# Patient Record
Sex: Female | Born: 1945 | Race: White | Hispanic: No | Marital: Married | Smoking: Never smoker
Health system: Southern US, Community
[De-identification: ages and names within clinical notes are randomized; demographics above are authoritative.]

## PROBLEM LIST (undated history)

## (undated) DIAGNOSIS — M1990 Unspecified osteoarthritis, unspecified site: Secondary | ICD-10-CM

## (undated) DIAGNOSIS — R0600 Dyspnea, unspecified: Secondary | ICD-10-CM

## (undated) DIAGNOSIS — R011 Cardiac murmur, unspecified: Secondary | ICD-10-CM

## (undated) DIAGNOSIS — E782 Mixed hyperlipidemia: Secondary | ICD-10-CM

## (undated) DIAGNOSIS — Z9889 Other specified postprocedural states: Secondary | ICD-10-CM

## (undated) DIAGNOSIS — R350 Frequency of micturition: Secondary | ICD-10-CM

## (undated) DIAGNOSIS — K219 Gastro-esophageal reflux disease without esophagitis: Secondary | ICD-10-CM

## (undated) DIAGNOSIS — D649 Anemia, unspecified: Secondary | ICD-10-CM

## (undated) DIAGNOSIS — Z86010 Personal history of colonic polyps: Secondary | ICD-10-CM

## (undated) DIAGNOSIS — Z860101 Personal history of adenomatous and serrated colon polyps: Secondary | ICD-10-CM

## (undated) DIAGNOSIS — E119 Type 2 diabetes mellitus without complications: Secondary | ICD-10-CM

## (undated) DIAGNOSIS — M6281 Muscle weakness (generalized): Secondary | ICD-10-CM

## (undated) DIAGNOSIS — R0609 Other forms of dyspnea: Secondary | ICD-10-CM

## (undated) DIAGNOSIS — R630 Anorexia: Secondary | ICD-10-CM

## (undated) DIAGNOSIS — R079 Chest pain, unspecified: Secondary | ICD-10-CM

## (undated) DIAGNOSIS — R197 Diarrhea, unspecified: Secondary | ICD-10-CM

## (undated) DIAGNOSIS — Z9989 Dependence on other enabling machines and devices: Secondary | ICD-10-CM

## (undated) DIAGNOSIS — R3915 Urgency of urination: Secondary | ICD-10-CM

## (undated) DIAGNOSIS — C55 Malignant neoplasm of uterus, part unspecified: Secondary | ICD-10-CM

## (undated) DIAGNOSIS — R110 Nausea: Secondary | ICD-10-CM

## (undated) DIAGNOSIS — C541 Malignant neoplasm of endometrium: Secondary | ICD-10-CM

## (undated) DIAGNOSIS — I10 Essential (primary) hypertension: Secondary | ICD-10-CM

## (undated) DIAGNOSIS — M109 Gout, unspecified: Secondary | ICD-10-CM

## (undated) HISTORY — PX: CATARACT EXTRACTION W/ INTRAOCULAR LENS  IMPLANT, BILATERAL: SHX1307

## (undated) HISTORY — DX: Unspecified osteoarthritis, unspecified site: M19.90

## (undated) HISTORY — DX: Gout, unspecified: M10.9

## (undated) HISTORY — PX: TONSILLECTOMY: SUR1361

## (undated) HISTORY — DX: Frequency of micturition: R35.0

## (undated) HISTORY — DX: Anemia, unspecified: D64.9

## (undated) HISTORY — DX: Malignant neoplasm of uterus, part unspecified: C55

---

## 1972-10-22 HISTORY — PX: BREAST CYST EXCISION: SHX579

## 2004-05-09 ENCOUNTER — Other Ambulatory Visit: Admission: RE | Admit: 2004-05-09 | Discharge: 2004-05-09 | Payer: Self-pay | Admitting: Obstetrics and Gynecology

## 2004-11-02 ENCOUNTER — Ambulatory Visit: Payer: Self-pay | Admitting: Cardiology

## 2004-11-21 ENCOUNTER — Encounter: Admission: RE | Admit: 2004-11-21 | Discharge: 2005-02-19 | Payer: Self-pay | Admitting: Cardiology

## 2005-03-28 ENCOUNTER — Ambulatory Visit: Payer: Self-pay | Admitting: Internal Medicine

## 2005-03-29 ENCOUNTER — Encounter (INDEPENDENT_AMBULATORY_CARE_PROVIDER_SITE_OTHER): Payer: Self-pay | Admitting: *Deleted

## 2005-03-29 ENCOUNTER — Ambulatory Visit: Payer: Self-pay | Admitting: Internal Medicine

## 2005-03-30 ENCOUNTER — Ambulatory Visit: Payer: Self-pay | Admitting: Cardiology

## 2005-05-07 ENCOUNTER — Ambulatory Visit: Payer: Self-pay | Admitting: Internal Medicine

## 2005-05-10 ENCOUNTER — Other Ambulatory Visit: Admission: RE | Admit: 2005-05-10 | Discharge: 2005-05-10 | Payer: Self-pay | Admitting: Obstetrics and Gynecology

## 2006-05-09 ENCOUNTER — Ambulatory Visit: Payer: Self-pay | Admitting: Cardiology

## 2006-05-15 ENCOUNTER — Ambulatory Visit: Payer: Self-pay | Admitting: Cardiology

## 2006-10-25 ENCOUNTER — Ambulatory Visit: Payer: Self-pay | Admitting: Internal Medicine

## 2007-04-14 ENCOUNTER — Ambulatory Visit: Payer: Self-pay | Admitting: Internal Medicine

## 2008-04-15 DIAGNOSIS — K222 Esophageal obstruction: Secondary | ICD-10-CM | POA: Insufficient documentation

## 2008-04-15 DIAGNOSIS — E119 Type 2 diabetes mellitus without complications: Secondary | ICD-10-CM | POA: Insufficient documentation

## 2008-04-15 DIAGNOSIS — K219 Gastro-esophageal reflux disease without esophagitis: Secondary | ICD-10-CM | POA: Insufficient documentation

## 2008-04-15 DIAGNOSIS — I10 Essential (primary) hypertension: Secondary | ICD-10-CM | POA: Insufficient documentation

## 2008-04-15 DIAGNOSIS — I517 Cardiomegaly: Secondary | ICD-10-CM | POA: Insufficient documentation

## 2008-04-19 ENCOUNTER — Ambulatory Visit: Payer: Self-pay | Admitting: Internal Medicine

## 2008-07-05 ENCOUNTER — Ambulatory Visit: Payer: Self-pay | Admitting: Internal Medicine

## 2008-12-21 ENCOUNTER — Encounter: Payer: Self-pay | Admitting: Internal Medicine

## 2009-03-29 ENCOUNTER — Ambulatory Visit: Payer: Self-pay | Admitting: Internal Medicine

## 2009-03-30 ENCOUNTER — Telehealth: Payer: Self-pay | Admitting: Internal Medicine

## 2009-04-01 ENCOUNTER — Ambulatory Visit: Payer: Self-pay | Admitting: Internal Medicine

## 2009-04-01 ENCOUNTER — Encounter: Payer: Self-pay | Admitting: Internal Medicine

## 2009-04-05 ENCOUNTER — Encounter: Payer: Self-pay | Admitting: Internal Medicine

## 2009-04-22 ENCOUNTER — Ambulatory Visit: Payer: Self-pay | Admitting: Internal Medicine

## 2009-04-22 DIAGNOSIS — E782 Mixed hyperlipidemia: Secondary | ICD-10-CM | POA: Insufficient documentation

## 2009-04-29 ENCOUNTER — Telehealth: Payer: Self-pay | Admitting: Internal Medicine

## 2009-05-02 ENCOUNTER — Ambulatory Visit: Payer: Self-pay | Admitting: Internal Medicine

## 2009-05-02 DIAGNOSIS — I2510 Atherosclerotic heart disease of native coronary artery without angina pectoris: Secondary | ICD-10-CM | POA: Insufficient documentation

## 2009-05-02 DIAGNOSIS — I700 Atherosclerosis of aorta: Secondary | ICD-10-CM | POA: Insufficient documentation

## 2009-05-03 LAB — CONVERTED CEMR LAB
ALT: 40 units/L — ABNORMAL HIGH (ref 0–35)
Bilirubin, Direct: 0 mg/dL (ref 0.0–0.3)
Calcium: 9.3 mg/dL (ref 8.4–10.5)
GFR calc non Af Amer: 90.56 mL/min (ref >60)
Glucose, Bld: 123 mg/dL — ABNORMAL HIGH (ref 70–99)
HDL: 34.1 mg/dL — ABNORMAL LOW (ref >39.00)
Sodium: 140 meq/L (ref 135–145)
Total Protein: 7.3 g/dL (ref 6.0–8.3)
Triglycerides: 243 mg/dL — ABNORMAL HIGH (ref 0.0–149.0)

## 2009-08-22 ENCOUNTER — Telehealth: Payer: Self-pay | Admitting: Internal Medicine

## 2009-08-23 ENCOUNTER — Telehealth (INDEPENDENT_AMBULATORY_CARE_PROVIDER_SITE_OTHER): Payer: Self-pay | Admitting: *Deleted

## 2010-01-25 ENCOUNTER — Encounter: Payer: Self-pay | Admitting: Internal Medicine

## 2010-01-31 ENCOUNTER — Encounter (INDEPENDENT_AMBULATORY_CARE_PROVIDER_SITE_OTHER): Payer: Self-pay | Admitting: *Deleted

## 2010-03-10 ENCOUNTER — Ambulatory Visit: Payer: Self-pay | Admitting: Internal Medicine

## 2010-03-31 ENCOUNTER — Ambulatory Visit: Payer: Self-pay | Admitting: Internal Medicine

## 2010-03-31 DIAGNOSIS — R0989 Other specified symptoms and signs involving the circulatory and respiratory systems: Secondary | ICD-10-CM | POA: Insufficient documentation

## 2010-04-11 ENCOUNTER — Encounter: Payer: Self-pay | Admitting: Internal Medicine

## 2010-04-11 ENCOUNTER — Ambulatory Visit: Payer: Self-pay

## 2010-05-02 ENCOUNTER — Telehealth: Payer: Self-pay | Admitting: Internal Medicine

## 2010-06-29 ENCOUNTER — Ambulatory Visit: Payer: Self-pay | Admitting: Internal Medicine

## 2010-07-06 ENCOUNTER — Telehealth: Payer: Self-pay | Admitting: Internal Medicine

## 2010-07-07 ENCOUNTER — Telehealth: Payer: Self-pay | Admitting: Internal Medicine

## 2010-07-07 LAB — CONVERTED CEMR LAB
Cholesterol: 119 mg/dL (ref 0–200)
Total CHOL/HDL Ratio: 4

## 2010-09-12 ENCOUNTER — Telehealth: Payer: Self-pay | Admitting: Internal Medicine

## 2010-09-27 ENCOUNTER — Telehealth: Payer: Self-pay | Admitting: Internal Medicine

## 2010-10-26 ENCOUNTER — Telehealth: Payer: Self-pay | Admitting: Internal Medicine

## 2010-11-23 NOTE — Assessment & Plan Note (Signed)
Summary: yearly/sl   Referring Provider:  n/a Primary Provider:  Silvestre Moment, M.D.   History of Present Illness: History of Present Illness: Ms. Madison Holden is a 65 year old woman, who I have seen in the past. She has a history of hypertension, diabetes, dyslipidemia. I last saw her in July  of last year.  Lipids after that  LDL was 109, HDL 34. Since seen she has done ok from a cardiac standpoint.  She is not as active as she wants to be and plans to get more active. She denies chest pains.  Breathing is OK She has had some L wrist pains.  W/U so far suggests a possible rheum problem with incrased ESR.  Patient has an appt with rheum.   Current Medications (verified): 1)  Verapamil Hcl Cr 180 Mg  Cp24 (Verapamil Hcl) .Marland Kitchen.. 1 Tablet Once Daily 2)  Lisinopril 20 Mg  Tabs (Lisinopril) .Marland Kitchen.. 1 Tablet By Mouth Once Daily 3)  Hydrochlorothiazide 25 Mg  Tabs (Hydrochlorothiazide) .... 1/2 Tablet By Mouth Once Daily 4)  Vesicare 5 Mg  Tabs (Solifenacin Succinate) .Marland Kitchen.. 1 Tablet Once Daily 5)  Nexium 40 Mg  Cpdr (Esomeprazole Magnesium) .... Take 1 Tablet By Mouth Once A Day 6)  Metformin Hcl 1000 Mg  Tabs (Metformin Hcl) .Marland Kitchen.. 1 Tablet Two Times A Day 7)  Melatonin Cr 3 Mg  Tbcr (Melatonin) .Marland Kitchen.. 1-2 Times Per Week 8)  Motrin Ib 200 Mg  Tabs (Ibuprofen) .Marland Kitchen.. 1-2  Tablets As Needed 9)  Vanquish 227-194-33 Mg  Tabs (Asa-Apap-Caff Buffered) .... As Needed  Allergies (verified): 1)  ! Codeine  Past History:  Family History: Last updated: 03/10/2010 Family History of Heart Disease: Mother, Father Family History of Prostate Cancer: Father No FH of Colon Cancer:  Social History: Last updated: 03/29/2009 Patient has never smoked.  Alcohol Use - yes-2-3 per week Illicit Drug Use - no Daily Caffeine Use- 2 cups per day Patient does not get regular exercise.  Occupation: retired  Past medical, surgical, family and social histories (including risk factors) reviewed, and no changes noted (except  as noted below).  Past Medical History: Current Problems:  Hx of ESOPHAGEAL STRICTURE (ICD-530.3) GERD (ICD-530.81) DIABETES MELLITUS (ICD-250.00) VENTRICULAR HYPERTROPHY, LEFT (ICD-429.3) HYPERTENSION (ICD-401.9) Dyslipidemia Obesity.  Past Surgical History: Reviewed history from 03/29/2009 and no changes required. Removal of Cyst in breast   Family History: Reviewed history from 03/10/2010 and no changes required. Family History of Heart Disease: Mother, Father Family History of Prostate Cancer: Father No FH of Colon Cancer:  Social History: Reviewed history from 03/29/2009 and no changes required. Patient has never smoked.  Alcohol Use - yes-2-3 per week Illicit Drug Use - no Daily Caffeine Use- 2 cups per day Patient does not get regular exercise.  Occupation: retired  Review of Systems       ALl systems reviewed.  Negative to the above problem except as noted.  Vital Signs:  Patient profile:   65 year old female Height:      62 inches Weight:      246 pounds BMI:     45.16 Pulse rate:   67 / minute Resp:     18 per minute BP sitting:   150 / 80  (left arm)  Vitals Entered By: Burnett Kanaris, CNA (March 31, 2010 3:37 PM)  Physical Exam  Additional Exam:  Patient is in NAD HEENT:  Normocephalic, atraumatic. EOMI, PERRLA.  Neck: JVP is normal. No thyromegaly. Question  bruit on R. Lungs: clear to  auscultation. No rales no wheezes.  Heart: Regular rate and rhythm. Normal S1, S2. No S3.   No significant murmurs. PMI not displaced.  Abdomen:  Supple, nontender. Normal bowel sounds. No masses. No hepatomegaly.  Extremities:   Good distal pulses throughout. No lower extremity edema.  Musculoskeletal :moving all extremities.  Neuro:   alert and oriented x3.    EKG  Procedure date:  03/31/2010  Findings:      NSR>  80 bpm.  Impression & Recommendations:  Problem # 1:  CAROTID BRUIT (ICD-785.9) Question bruit.  will set up for carotid USN.  Problem #  2:  HYPERLIPIDEMIA (ICD-272.4) Patient will need fasting lipids.  Problem # 3:  HYPERTENSION (ICD-401.9) BP today is higher than usual.  WIll not change meds but will need close f/u.  Problem # 4:  DIABETES MELLITUS (ICD-250.00) Counselled on diet and exercise. Her updated medication list for this problem includes:    Lisinopril 20 Mg Tabs (Lisinopril) .Marland Kitchen... 1 tablet by mouth once daily    Metformin Hcl 1000 Mg Tabs (Metformin hcl) .Marland Kitchen... 1 tablet two times a day  Other Orders: EKG w/ Interpretation (93000) Carotid Duplex (Carotid Duplex)  Patient Instructions: 1)  Your physician recommends that you schedule a follow-up appointment in: 12 months 2)  Your physician has requested that you have a carotid duplex. This test is an ultrasound of the carotid arteries in your neck. It looks at blood flow through these arteries that supply the brain with blood. Allow one hour for this exam. There are no restrictions or special instructions. Prescriptions: HYDROCHLOROTHIAZIDE 25 MG  TABS (HYDROCHLOROTHIAZIDE) 1/2 tablet by mouth once daily  #45 x 3   Entered by:   Ollen Gross, RN, BSN   Authorized by:   Sherrill Raring, MD, [facility]   Signed by:   Ollen Gross, RN, BSN on 03/31/2010   Method used:   Print then Give to Patient   RxID:   [id number] LISINOPRIL 20 MG  TABS (LISINOPRIL) 1 tablet by mouth once daily  #90 x 3   Entered by:   Ollen Gross, RN, BSN   Authorized by:   Sherrill Raring, MD, [facility]   Signed by:   Ollen Gross, RN, BSN on 03/31/2010   Method used:   Print then Give to Patient   RxID:   [id number] VERAPAMIL HCL CR 180 MG  CP24 (VERAPAMIL HCL) 1 tablet once daily  #90 x 3   Entered by:   Ollen Gross, RN, BSN   Authorized by:   Sherrill Raring, MD, [facility]   Signed by:   Ollen Gross, RN, BSN on 03/31/2010   Method used:   Print then Give to Patient   RxID:   [id number]

## 2010-11-23 NOTE — Letter (Signed)
Summary: Appointment - Reminder 2  [facility], Main Office  [address]   [place], [place] [address]   Phone: [phone]  Fax: [phone]     January 31, 2010 MRN: [id number]   [facility] [address] [place], [place]  [address]   Dear Ms. Cease,  Our records indicate that it is time to schedule a follow-up appointment with Dr. Tenny Craw. It is very important that we reach you to schedule this appointment. We look forward to participating in your health care needs. Please contact us at the number listed above at your earliest convenience to schedule your appointment.  If you are unable to make an appointment at this time, give [place] a call so we can update our records.     Sincerely,   Migdalia Dk [facility] Scheduling Team

## 2010-11-23 NOTE — Progress Notes (Signed)
Summary: Need samples on Lipitor  Phone Note Call from Patient Call back at [facility] Phone [phone]   Caller: Patient Summary of Call: Pt calling for samples of Lipitor  Initial call taken by: Judie Grieve,  July 06, 2010 1:57 PM  Follow-up for Phone Call        Called patient...left samples of Lipitor 20mg  for her to pick up at the front desk. Layne Benton, RN, BSN  July 06, 2010 5:00 PM   .

## 2010-11-23 NOTE — Progress Notes (Signed)
Summary: pt wants to try new medication  Phone Note Call from Patient Call back at Home Phone [phone]   Caller: Patient Reason for Call: Talk to Nurse, Talk to Doctor Summary of Call: pt would like to try the lipitor that Dr. Tenny Craw suggested Initial call taken by: Omer Jack,  May 02, 2010 3:52 PM  Follow-up for Phone Call        PT CALLED MEDS SENT THROUGH EMR AS WELL AS LAB SCHEDULED FOR 06/29/10 AT 9:30. Follow-up by: Scherrie Bateman, LPN,  May 02, 2010 4:31 PM

## 2010-11-23 NOTE — Progress Notes (Signed)
Summary: Samples  Phone Note Call from Patient Call back at [name] Phone [phone]   Caller: Patient Call For: Dr. Juanda Chance Reason for Call: Talk to Nurse Summary of Call: Asking for samples of Nexium.Marland KitchenMarland KitchenIns. will not cover it for the rest of this year Initial call taken by: Karna Christmas,  September 12, 2010 2:47 PM  Follow-up for Phone Call        I have placed samples at the front desk for patient pick up and she has been advised. Samples Given # 8  -Lot Number:  [id number], [id number] Follow-up by: Lamona Curl CMA (AAMA),  September 13, 2010 11:30 AM

## 2010-11-23 NOTE — Progress Notes (Signed)
Summary: refill request  Phone Note From Pharmacy   [facility]er: [facility] Juba [phone] Summary of Call: pharmacy calling for refill of verapamil  Initial call taken by: Glynda Jaeger,  October 26, 2010 2:14 PM    Prescriptions: VERAPAMIL HCL CR 180 MG  CP24 (VERAPAMIL HCL) 1 tablet once daily  #90 x 1   Entered by:   Caralee Ates CMA   Authorized by:   Sherrill Raring, MD, [facility]   Signed by:   Caralee Ates CMA on 10/27/2010   Method used:   Electronically to        [facility] [phone]* (retail)       [address] [place], [place]  [address]       Ph: [phone]       Fax: [phone]   RxID:   [id number]

## 2010-11-23 NOTE — Progress Notes (Signed)
Summary: refill request/[facility]  Phone Note Refill Request   Refills Requested: Medication #1:  LIPITOR 20 MG TABS 1/2 tab once daily. pt requesting generic/insurance maxed out/[facility]   Method Requested: Telephone to Pharmacy Initial call taken by: Glynda Jaeger,  September 27, 2010 10:11 AM  Follow-up for Phone Call        need to speak to pt about medication lipitor.left message for her to call me back...spoke with pt shes going to pay out of pocket until december.Called pharmacy and gave pt refills generic of lipitor  Follow-up by: Kem Parkinson,  September 28, 2010 11:12 AM

## 2010-11-23 NOTE — Assessment & Plan Note (Signed)
Summary: REFILL NEXIUM...AS.   History of Present Illness Visit Type: Follow-up Visit Primary GI MD: Lina Sar MD Primary Provider: Silvestre Moment, M.D. Chief Complaint: Nexium refills, pt has No GI complaints as long as she takes her Nexium, Has a few questions for Dr Juanda Chance History of Present Illness:   This is a 65 year old white female with chronic gastroesophageal reflux well controlled on Nexium 40 mg daily. Her last upper endoscopy in June 2006 showed mild esophagitis and a mild esophageal stricture. She was dilated with a 48 [place] Maloney dilator. She denies any dysphagia or odynophagia. A colonoscopy in June 2010 showed a tubular adenoma which was removed with a snare. A recall colonoscopy will be due in June 2015.   GI Review of Systems      Denies abdominal pain, acid reflux, belching, bloating, chest pain, dysphagia with liquids, dysphagia with solids, heartburn, loss of appetite, nausea, vomiting, vomiting blood, weight loss, and  weight gain.        Denies anal fissure, black tarry stools, change in bowel habit, constipation, diarrhea, diverticulosis, fecal incontinence, heme positive stool, hemorrhoids, irritable bowel syndrome, jaundice, light color stool, liver problems, rectal bleeding, and  rectal pain.    Current Medications (verified): 1)  Verapamil Hcl Cr 180 Mg  Cp24 (Verapamil Hcl) .Marland Kitchen.. 1 Tablet Once Daily 2)  Lisinopril 20 Mg  Tabs (Lisinopril) .Marland Kitchen.. 1 Tablet By Mouth Once Daily 3)  Hydrochlorothiazide 25 Mg  Tabs (Hydrochlorothiazide) .... 1/2 Tablet By Mouth Once Daily 4)  Vesicare 5 Mg  Tabs (Solifenacin Succinate) .Marland Kitchen.. 1 Tablet Once Daily 5)  Nexium 40 Mg  Cpdr (Esomeprazole Magnesium) .... Take 1 Tablet By Mouth Once A Day 6)  Metformin Hcl 1000 Mg  Tabs (Metformin Hcl) .Marland Kitchen.. 1 Tablet Two Times A Day 7)  Melatonin Cr 3 Mg  Tbcr (Melatonin) .Marland Kitchen.. 1-2 Times Per Week 8)  Motrin Ib 200 Mg  Tabs (Ibuprofen) .Marland Kitchen.. 1-2  Tablets As Needed 9)  Vanquish 227-194-33 Mg   Tabs (Asa-Apap-Caff Buffered) .... As Needed  Allergies (verified): 1)  ! Codeine  Past History:  Past Medical History: Last updated: 04/22/2009 Current Problems:  Hx of ESOPHAGEAL STRICTURE (ICD-530.3) GERD (ICD-530.81) DIABETES MELLITUS (ICD-250.00) VENTRICULAR HYPERTROPHY, LEFT (ICD-429.3) HYPERTENSION (ICD-401.9) Dyslipidemia  Past Surgical History: Last updated: 03/29/2009 Removal of Cyst in breast   Family History: Family History of Heart Disease: Mother, Father Family History of Prostate Cancer: Father No FH of Colon Cancer:  Social History: Reviewed history from 03/29/2009 and no changes required. Patient has never smoked.  Alcohol Use - yes-2-3 per week Illicit Drug Use - no Daily Caffeine Use- 2 cups per day Patient does not get regular exercise.  Occupation: retired  Review of Systems       The patient complains of arthritis/joint pain.  The patient denies allergy/sinus, anemia, anxiety-new, back pain, blood in urine, breast changes/lumps, change in vision, confusion, cough, coughing up blood, depression-new, fainting, fatigue, fever, headaches-new, hearing problems, heart murmur, heart rhythm changes, itching, menstrual pain, muscle pains/cramps, night sweats, nosebleeds, pregnancy symptoms, shortness of breath, skin rash, sleeping problems, sore throat, swelling of feet/legs, swollen lymph glands, thirst - excessive , urination - excessive , urination changes/pain, urine leakage, vision changes, and voice change.         joint pain in left hand   Vital Signs:  Patient profile:   65 year old female Height:      62 inches Weight:      248 pounds BMI:  45.52 BSA:     2.10 Pulse (ortho):   80 / minute Pulse rhythm:   regular BP sitting:   122 / 80  (left arm)  Vitals Entered By: Merri Ray CMA Duncan Dull) (Mar 10, 2010 3:19 PM)   Impression & Recommendations:  Problem # 1:  Hx of ESOPHAGEAL STRICTURE (ICD-530.3) Patient denies  dysphagia.  Problem # 2:  GERD (ICD-530.81) Patient has gastroesophageal reflux which is under good control with Nexium 40 mg daily. She needs a refill for one year. We discussed the possibility of using Prilosec for cost effectivness.  Problem # 3:  DIABETES MELLITUS (ICD-250.00) Patient has pain in the right wrist which is most likely carpal tunnel syndrome which is increased in diabetics.  Problem # 4:  SPECIAL SCREENING FOR MALIGNANT NEOPLASMS COLON (ICD-V76.51) Patient had a ttubular adenoma and is status post polypectomy in June 2010. A recall colonoscopy will be due in June 2015.  Patient Instructions: 1)  Nexium refill 40 mg daily. 2)  Weight loss. 3)  Antireflux measures. 4)  Recall colonoscopy June 2015. 5)  Copy sent to : Dr Silvestre Moment 6)  The medication list was reviewed and reconciled.  All changed / newly prescribed medications were explained.  A complete medication list was provided to the patient / caregiver. Prescriptions: NEXIUM 40 MG  CPDR (ESOMEPRAZOLE MAGNESIUM) Take 1 tablet by mouth once a day  #90 x 3   Entered by:   Lamona Curl CMA (AAMA)   Authorized by:   Hart Carwin MD   Signed by:   Lamona Curl CMA (AAMA) on 03/10/2010   Method used:   Printed then faxed to ...       [facility]  [address] [phone]* (retail)       [address]       [place], [place]  [address]       Ph: [phone] or [phone]       Fax: [phone]   RxID:   [id number]

## 2010-11-23 NOTE — Progress Notes (Signed)
Summary: lab work to [facility] from Patient Call back at [facility] [phone]   Caller: Patient Reason for Call: Talk to Nurse Details for Reason: pls send copy of lab work to pcp dr. Izola Price office [phone] fax # [phone] Initial call taken by: Lorne Skeens,  July 07, 2010 3:02 PM  Follow-up for Phone Call        Labs faxed to Dr.Stephen Izola Price per patient request. Layne Benton, RN, BSN  July 07, 2010 5:10 PM

## 2011-01-17 ENCOUNTER — Encounter: Payer: Self-pay | Admitting: Internal Medicine

## 2011-01-29 LAB — GLUCOSE, CAPILLARY
Glucose-Capillary: 113 mg/dL — ABNORMAL HIGH (ref 70–99)
Glucose-Capillary: 115 mg/dL — ABNORMAL HIGH (ref 70–99)

## 2011-03-06 NOTE — Assessment & Plan Note (Signed)
[facility]                            CARDIOLOGY OFFICE NOTE   NAME:Camilli, Jerolyn Center                       MRN:          [id number]  DATE:04/14/2007                            DOB:          08-16-1946    PATIENT IDENTIFICATION:  The patient is a 65 year old woman who was  previously followed by Dr. Lewayne Bunting. She was last seen in our [place]  office back in July of last year.   The patient has no known history of coronary artery disease, has a  history of a hypertension, LVH by echo.   The patient comes for continued care of her blood pressure.   Note she was recently diagnosed with diabetes and has lost some weight  and has begun on oral agents.   She denies chest pain, is active, notes no increased shortness of  breath. No increased fatigability.   CURRENT MEDICATIONS:  1. Vesicare 5 daily.  2. Nexium 40 daily.  3. Lisinopril 20 daily.  4. Hydrochlorothiazide 25 daily.  5. Verapamil 180 daily.  6. Melatonin 3 q.h.s.  7. Metformin 1 gram b.i.d.   PAST MEDICAL HISTORY:  1. Hypertension.  2. Diabetes.   ALLERGIES:  CODEINE.   SOCIAL HISTORY:  Drinks 3 drinks per week, does not smoke.   FAMILY HISTORY:  Mother had congestive heart failure, died at age 89.  Father had prostate cancer, died at age 45 of pneumonia.   PHYSICAL EXAMINATION:  GENERAL:  The patient is in no distress.  VITAL SIGNS:  Blood pressure 112/64, pulse is 63, weight 221 down from  255 in January.  NECK:  JVP is normal, no bruits.  LUNGS:  Clear.  CARDIAC:  Regular rate and rhythm, S1, S2, no S3, S4 or murmurs..  ABDOMEN:  Obese, benign.  EXTREMITIES:  No edema.   A 12-lead EKG shows normal sinus rhythm, 63 beats per minute, occasional  PVC. Nonspecific ST changes.   IMPRESSION:  1. Hypertension. Good control. Would continue.  2. Diabetes on oral agents. Encouraged her to continue to lose weight      and applauded her for this.  3. Health care maintenance. Will  have her check a fasting lipid panel      when she is seen by Dr. Lanier Ensign.   I would recommend that the patient begin on a baby aspirin, enteric  coated. She should continue on GI prophylaxis as she is given that she  has had esophagitis in the past. I will be in touch with her once I see  the blood results, otherwise I will set followup for one year's time.     Pricilla Riffle, MD, [facility]  Electronically Signed    PVR/MedQ  DD: 04/14/2007  DT: 04/15/2007  Job #: [id number]   cc:   Joycelyn Rua, M.D.

## 2011-03-06 NOTE — Assessment & Plan Note (Signed)
[facility]                            CARDIOLOGY OFFICE NOTE   NAME:Madison Holden, Madison Holden                       MRN:          [id number]  DATE:07/05/2008                            DOB:          13-Jun-1946    IDENTIFICATION:  Madison Holden is a 65 year old woman.  I saw her back in  June 2008.  She was previously followed by Dr. Andee Lineman.  She has a  history of hypertension and diabetes.   Since seen, she has lost weight and now regained it.  She denies chest  pain.  No significant shortness of breath.   CURRENT MEDICATIONS:  1. Vesicare 5 daily.  2. Nexium 40.  3. Lisinopril 20.  4. Hydrochlorothiazide 25.  5. Verapamil 180.  6. Melatonin p.r.n.  7. Metformin 1 g b.i.d.   PHYSICAL EXAMINATION:  GENERAL:  The patient is in no distress.  VITAL SIGNS:  Blood pressure 114/66.  Pulse is 79 and regular.  Weight  is 231 up from 221.  NECK:  No bruits.  LUNGS:  Clear.  No rales.  CARDIAC:  Regular rate and rhythm.  S1 and S2.  No S3.  No significant  murmurs.  ABDOMEN:  Benign, obese.  EXTREMITIES:  No edema.   A 12-lead EKG normal sinus rhythm at 79 beats per minute.  LVH by  voltage with T-wave inversion aVL.   IMPRESSION:  1. Hypertension, adequate control, would continue.  I have not      scheduled an echo for her.  She has had in the past, it did confirm      left ventricular hypertrophy.  I do not have records of this though      at present.  I encouraged her to increase her activity, get her      weight down may help with her medicine needs.  2. Health care maintenance.  Lipids done in Dr. Jacqualyn Posey office      showed a cholesterol 181, triglycerides of 270, LDL of 100, and HDL      of 39.  Note, her hemoglobin A1c was 6.2.  With her diabetes and      hypertension, I would like tighter control of this.  If she cannot      get it down, I think she should be on a statin.  I would treat      aggressively to prevent further develop of problems  given her      metabolic profile.   I will set to see her back in the summer, sooner if problems develop.  Again, she will follow up with Dr. Joycelyn Holden, it sounds like  sooner.     Pricilla Riffle, MD, [facility]  Electronically Signed    PVR/MedQ  DD: 07/05/2008  DT: 07/06/2008  Job #: [id number]   cc:   Madison Holden, M.D.

## 2011-03-09 NOTE — Assessment & Plan Note (Signed)
[facility]                            [place] CARDIOLOGY OFFICE NOTE   NAME:Rosendahl, Jerolyn Center                       MRN:          [id number]  DATE:05/09/2006                            DOB:          02-Sep-1946    PRIMARY CARE PHYSICIAN:  Learta Codding, MD, [facility]   REASON FOR VISIT:  Ms. Petraglia presents for annual followup.   The patient is a 65 year old female with no known history of coronary artery  disease who was last seen here in the office by Dr. Andee Lineman in June 2006.  She has history of hypertension and left ventricular hypertrophy by prior  echocardiogram.  Her medications were adjusted both for simplification  reasons as well as for financial considerations and she was started on  lisinopril 20 daily, Verapamil SR 240 daily and hydrochlorothiazide 25  daily.   The patient now presents reporting that shortly after starting these  medications, albeit several months later, she has since experienced tingling  of both hands, particularly in the fingers which occurs 2-3 times a week.  She also feels that her chronic lower extremity edema has worsened somewhat,  particularly on the left, however, she denies any exacerbation of her  baseline exertional dyspnea nor any development of orthopnea or paroxysmal  nocturnal dyspnea.  She also complains of perhaps five episodes of a  twinge sensation in the upper left chest which lasts at most a few seconds  in duration.  These occur at rest under periods of stress and are not  associated with any significant symptoms.  Moreover, she denies any  exertional chest discomfort.   The patient reports that she has not had any followup since she was last  seen here in the office.  She has also not had any followup blood work.   Electrocardiogram today reveals normal sinus rhythm at 94 BPM with normal  axis and minimal voltage criteria for LVH.   CURRENT MEDICATIONS:  1.  Verapamil 180 mg daily.  2.  Lisinopril  20 mg daily.  3.  Hydrochlorothiazide 25 mg daily.  4.  Vesicare 5 mg daily.  5.  Nexium 40 mg daily.  6.  Glucosamine 1500 mg daily.   PHYSICAL EXAMINATION:  VITAL SIGNS:  Blood pressure 144/84, pulse 94,  regular weight 248.  NECK:  Palpable carotid pulses without bruits.  LUNGS:  Clear to auscultation in all fields.  HEART:  Regular rate and rhythm, S1, S2.  Positive S4, soft, grade 1/6  systolic ejection murmur.  EXTREMITIES:  2-3+ bilateral lower extremity and pedal edema with bilateral  dry, scaly skin and diffuse erythematous papules.  NEUROLOGIC:  No focal deficits.   IMPRESSION:  1.  Atypical chest pain.  2.  Hypertension.  3.  Chronic bilateral lower extremity edema, suspect secondary to venous      insufficiency.  4.  Obesity.  5.  Known normal left ventricular function/left ventricular hypertrophy, by      2-D echocardiography.   PLAN:  1.  Increase hydrochlorothiazide to 50 mg daily x5 days for improved      diuresis.  2.  Supplemental potassium at 20 mEq daily (x5 days).  3.  Check a followup BMET/magnesium level following completion of this      diuretic course.  4.  Return in 1 year for continued followup with Dr. Andee Lineman.                                   Gene Serpe, PA-C                                Learta Codding, MD, [facility]   GS/MedQ  DD:  05/09/2006  DT:  05/10/2006  Job #:  [id number]   cc:   Joycelyn Rua, MD

## 2011-03-09 NOTE — Assessment & Plan Note (Signed)
[facility]                         GASTROENTEROLOGY OFFICE NOTE   NAME:WELLONSJerolyn Holden                       MRN:          [id number]  DATE:10/25/2006                            DOB:          03/15/1946    Madison Holden is a 65 year old white female who has severe  gastroesophageal reflux disease which has been under good control with  Nexium 40 mg q.a.m.  Upper endoscopy in June of 2006 showed mild  esophageal stricture with erosive esophagitis.  Biopsies did not show  Barrett's esophagus.  She has been under good control taking one Nexium  a day and she comes today for refill.  She has not needed to take any  extra over-the-counter preparation for control of her reflux.  She has  been following antireflux measures.  Unfortunately, her weight has been  steadily increasing from 243 pounds in the last appointment to currently  255 pounds.   MEDICATIONS:  1. Vesicare 5 mg daily.  2. Nexium 40 mg p.o. daily.  3. Lisinopril 20 mg p.o. daily.  4. Hydrochlorothiazide 25 mg p.o. daily.  5. Verapamil SR 180 mg p.o. daily.  6. Glucosamine and melatonin.   PHYSICAL EXAMINATION:  VITAL SIGNS:  Blood pressure 138/74, pulse 84,  and weight 255 pounds.  GENERAL:  The patient was not examined today.   IMPRESSION:  A 65 year old white female with gastroesophageal reflux  disease and mild esophageal stricture, currently asymptomatic under good  control with Nexium 40 mg daily.   PLAN:  1. Refill for Nexium.  2. Antireflux measures.  3. Weight loss.  4. The patient also would be a good candidate for screening      colonoscopy.  This should be considered within the next year.     Hedwig Morton. Juanda Chance, MD  Electronically Signed    DMB/MedQ  DD: 10/25/2006  DT: 10/25/2006  Job #: [id number]   cc:   Joycelyn Rua, M.D.

## 2011-03-26 ENCOUNTER — Encounter: Payer: Self-pay | Admitting: Internal Medicine

## 2011-04-12 ENCOUNTER — Encounter: Payer: Self-pay | Admitting: Internal Medicine

## 2011-04-12 ENCOUNTER — Ambulatory Visit (INDEPENDENT_AMBULATORY_CARE_PROVIDER_SITE_OTHER): Payer: BC Managed Care – PPO | Admitting: Internal Medicine

## 2011-04-12 VITALS — BP 129/68 | HR 71 | Resp 18 | Ht 60.0 in | Wt 268.0 lb

## 2011-04-12 DIAGNOSIS — I10 Essential (primary) hypertension: Secondary | ICD-10-CM

## 2011-04-12 DIAGNOSIS — I517 Cardiomegaly: Secondary | ICD-10-CM

## 2011-04-12 DIAGNOSIS — R0989 Other specified symptoms and signs involving the circulatory and respiratory systems: Secondary | ICD-10-CM

## 2011-04-12 DIAGNOSIS — E669 Obesity, unspecified: Secondary | ICD-10-CM

## 2011-04-12 DIAGNOSIS — E785 Hyperlipidemia, unspecified: Secondary | ICD-10-CM

## 2011-04-12 NOTE — Assessment & Plan Note (Signed)
MIld CV disease by USN 1 year ago.  Continue risk factor modification.

## 2011-04-12 NOTE — Assessment & Plan Note (Signed)
Now on lipitor   Recomm continuing.  Also recomm diet program.  Patient is already looking to.it.

## 2011-04-12 NOTE — Patient Instructions (Signed)
Your physician wants you to follow-up in: 12 months with Dr. Ross. You will receive a reminder letter in the mail two months in advance. If you don't receive a letter, please call our office to schedule the follow-up appointment.   

## 2011-04-12 NOTE — Assessment & Plan Note (Signed)
Adequate control.  Continue. 

## 2011-04-12 NOTE — Assessment & Plan Note (Signed)
Encouraged her to exercise regulary and watch diet.

## 2011-04-12 NOTE — Progress Notes (Signed)
HPI  Patient is a 65 year old with a history of HTN, obesity, mild CV disease, diyslipidemia. I saw her in clinic 1 year ago. Since seen she denies chest pains.  No dizzines.  No signif l SOB>  She does not exercise regulary. Allergies  Allergen Reactions  . Codeine     Current Outpatient Prescriptions  Medication Sig Dispense Refill  . ASA-APAP-Caff Buffered (VANQUISH) [phone] MG TABS Take by mouth as needed.        Marland Kitchen atorvastatin (LIPITOR) 20 MG tablet Take 20 mg by mouth daily. Take 1/2 tab       . esomeprazole (NEXIUM) 40 MG capsule Take 40 mg by mouth daily.        . hydrochlorothiazide 25 MG tablet Take 25 mg by mouth daily. 1/2 tab       . ibuprofen (MOTRIN IB) 200 MG tablet Take 200 mg by mouth. 1-2 tabs as needed       . lisinopril (PRINIVIL,ZESTRIL) 20 MG tablet Take 20 mg by mouth daily.        . Melatonin (MELATONIN CR) 3 MG TBCR Take by mouth. 1-2 times per week       . metFORMIN (GLUMETZA) 1000 MG (MOD) 24 hr tablet Take 1,000 mg by mouth 2 (two) times daily.        . solifenacin (VESICARE) 5 MG tablet Take 5 mg by mouth daily.       . verapamil (VERELAN PM) 180 MG 24 hr capsule Take 180 mg by mouth at bedtime.          Past Medical History  Diagnosis Date  . Stricture and stenosis of esophagus   . Esophageal reflux   . Type II or unspecified type diabetes mellitus without mention of complication, not stated as uncontrolled   . Cardiomegaly   . Unspecified essential hypertension   . Obesity     Past Surgical History  Procedure Date  . Removal of cyst in breast     Family History  Problem Relation Age of Onset  . Heart disease Mother   . Heart disease Father   . Prostate cancer Father     History   Social History  . Marital Status: Married    Spouse Name: N/A    Number of Children: N/A  . Years of Education: N/A   Occupational History  . Not on file.   Social History Main Topics  . Smoking status: Never Smoker   . Smokeless tobacco: Not on  file  . Alcohol Use: Yes     2-3 per  weel   . Drug Use: No  . Sexually Active: Not on file   Other Topics Concern  . Not on file   Social History Narrative   Has never smoked. Daily Caffeine Use- 2 cups per day. Does not get regular exercise. Retired.     Review of Systems:  All systems reviewed.  They are negative to the above problem except as previously stated.  Vital Signs: BP 129/68  Pulse 71  Resp 18  Ht 5' (1.524 m)  Wt 268 lb (121.564 kg)  BMI 52.34 kg/m2  Physical Exam  Patient is a morbidly obese 65 year old in NAD  HEENT:  Normocephalic, atraumatic. EOMI, PERRLA.  Neck: JVP is normal. No thyromegaly. No bruits.  Lungs: clear to auscultation. No rales no wheezes.  Heart: Regular rate and rhythm. Normal S1, S2. No S3.   No significant murmurs. PMI not displaced.  Abdomen:  Obese. Supple, nontender. Normal bowel sounds. No masses. No hepatomegaly.  Extremities:   Good distal pulses throughout. No lower extremity edema.  Musculoskeletal :moving all extremities.   EKG;  NSR.  71 bpm.  LVH.  Q wave in III. Neuro:   alert and oriented x3.  CN II-XII grossly intact.   Assessment and Plan:

## 2011-05-03 ENCOUNTER — Other Ambulatory Visit: Payer: Self-pay | Admitting: Obstetrics and Gynecology

## 2011-05-18 ENCOUNTER — Telehealth: Payer: Self-pay | Admitting: Internal Medicine

## 2011-05-18 NOTE — Telephone Encounter (Signed)
Pt calling to say her pcp was to send [place] the blood work he had done for her,however he couldn't do lipids and pt wants to know if dr Tenny Craw wants to do further testing , pcp will do more blood work in september

## 2011-05-18 NOTE — Telephone Encounter (Signed)
Called patient and discussed lab work. She had a lipid panel in March with her PCP and will have another one this fall. Advised her that this was OK and to forward results to Dr.Ross. If PCP does not do a lipid panel this fall she will let us know.

## 2011-06-19 ENCOUNTER — Other Ambulatory Visit: Payer: Self-pay | Admitting: Internal Medicine

## 2011-07-04 ENCOUNTER — Other Ambulatory Visit: Payer: Self-pay | Admitting: Internal Medicine

## 2011-07-12 ENCOUNTER — Encounter: Payer: Self-pay | Admitting: Internal Medicine

## 2011-07-18 ENCOUNTER — Other Ambulatory Visit: Payer: Self-pay | Admitting: *Deleted

## 2011-07-18 MED ORDER — LISINOPRIL 20 MG PO TABS: 20.0000 mg | ORAL_TABLET | Freq: Every day | ORAL | Status: DC

## 2011-07-18 MED ORDER — VERAPAMIL HCL ER 180 MG PO TBCR: 180.0000 mg | EXTENDED_RELEASE_TABLET | Freq: Every day | ORAL | Status: DC

## 2011-08-14 ENCOUNTER — Other Ambulatory Visit: Payer: Self-pay | Admitting: Internal Medicine

## 2011-09-05 ENCOUNTER — Other Ambulatory Visit: Payer: Self-pay | Admitting: *Deleted

## 2011-09-05 MED ORDER — ESOMEPRAZOLE MAGNESIUM 40 MG PO CPDR: 40.0000 mg | DELAYED_RELEASE_CAPSULE | Freq: Every day | ORAL | Status: DC

## 2011-09-20 ENCOUNTER — Telehealth: Payer: Self-pay | Admitting: Internal Medicine

## 2011-09-20 NOTE — Telephone Encounter (Signed)
Patient states that she is out of prescription. I have advised her that we sent #90 with 1 refill to [facility] on 09/05/11. Patient was unaware of this. She has been advised to call the pharmacy to see if there is a prescription on hold for her.

## 2012-01-17 ENCOUNTER — Encounter: Payer: Self-pay | Admitting: Internal Medicine

## 2012-03-19 ENCOUNTER — Telehealth: Payer: Self-pay | Admitting: Internal Medicine

## 2012-03-19 MED ORDER — ESOMEPRAZOLE MAGNESIUM 40 MG PO CPDR: 40.0000 mg | DELAYED_RELEASE_CAPSULE | Freq: Every day | ORAL | Status: DC

## 2012-03-19 NOTE — Telephone Encounter (Signed)
Advised patient that she needs office visit for further refills but I will give her a 1 month supply for now. She has scheduled an appointment.

## 2012-03-25 ENCOUNTER — Encounter (INDEPENDENT_AMBULATORY_CARE_PROVIDER_SITE_OTHER): Payer: BC Managed Care – PPO | Admitting: Ophthalmology

## 2012-03-25 DIAGNOSIS — E11319 Type 2 diabetes mellitus with unspecified diabetic retinopathy without macular edema: Secondary | ICD-10-CM

## 2012-03-25 DIAGNOSIS — H35039 Hypertensive retinopathy, unspecified eye: Secondary | ICD-10-CM

## 2012-03-25 DIAGNOSIS — E1139 Type 2 diabetes mellitus with other diabetic ophthalmic complication: Secondary | ICD-10-CM

## 2012-03-25 DIAGNOSIS — H43819 Vitreous degeneration, unspecified eye: Secondary | ICD-10-CM

## 2012-03-25 DIAGNOSIS — I10 Essential (primary) hypertension: Secondary | ICD-10-CM

## 2012-03-25 DIAGNOSIS — H2510 Age-related nuclear cataract, unspecified eye: Secondary | ICD-10-CM

## 2012-03-27 ENCOUNTER — Encounter: Payer: Self-pay | Admitting: Internal Medicine

## 2012-03-27 ENCOUNTER — Ambulatory Visit (INDEPENDENT_AMBULATORY_CARE_PROVIDER_SITE_OTHER): Payer: BC Managed Care – PPO | Admitting: Internal Medicine

## 2012-03-27 VITALS — BP 122/80 | HR 69 | Ht 61.5 in | Wt 258.4 lb

## 2012-03-27 DIAGNOSIS — I517 Cardiomegaly: Secondary | ICD-10-CM

## 2012-03-27 MED ORDER — VERAPAMIL HCL ER 180 MG PO TBCR: 180.0000 mg | EXTENDED_RELEASE_TABLET | Freq: Every day | ORAL | Status: DC

## 2012-03-27 MED ORDER — HYDROCHLOROTHIAZIDE 25 MG PO TABS: 25.0000 mg | ORAL_TABLET | Freq: Every day | ORAL | Status: DC

## 2012-03-27 MED ORDER — ATORVASTATIN CALCIUM 20 MG PO TABS: ORAL_TABLET | ORAL | Status: DC

## 2012-03-27 MED ORDER — LISINOPRIL 20 MG PO TABS: 20.0000 mg | ORAL_TABLET | Freq: Every day | ORAL | Status: DC

## 2012-03-27 NOTE — Progress Notes (Signed)
HPI  Patient is a 66 year old with a history of HTN, mild Carotid plaquing on USN, HL and obesity.  I saw her in clinic 1 year ago. SInce seen she has been under increased stress due to family issues.  She has increased wt. She denies CP.  Breathing is fair, no change   Does get SOB with activity but chronic.    Note recent Hgb A1C was 8.2 Allergies  Allergen Reactions  . Codeine     Current Outpatient Prescriptions  Medication Sig Dispense Refill  . ASA-APAP-Caff Buffered (VANQUISH) [phone] MG TABS Take by mouth as needed.        Marland Kitchen atorvastatin (LIPITOR) 20 MG tablet Take 1/2 tablet daily      . esomeprazole (NEXIUM) 40 MG capsule Take 1 capsule (40 mg total) by mouth daily.  30 capsule  0  . glipiZIDE (GLUCOTROL) 10 MG tablet Take 10 mg by mouth daily.      . hydrochlorothiazide 25 MG tablet Take 25 mg by mouth daily. 1/2 tab       . Ibuprofen (ADVIL) 200 MG CAPS Take 200 mg by mouth as needed.      Marland Kitchen lisinopril (PRINIVIL,ZESTRIL) 20 MG tablet Take 1 tablet (20 mg total) by mouth daily.  90 tablet  2  . Melatonin (MELATONIN CR) 3 MG TBCR Take by mouth. 1-2 times per week       . solifenacin (VESICARE) 5 MG tablet Take 5 mg by mouth daily.       . verapamil (CALAN-SR) 180 MG CR tablet Take 1 tablet (180 mg total) by mouth at bedtime.  90 tablet  6  . DISCONTD: LIPITOR 20 MG tablet TAKE ONE-HALF TABLET BY MOUTH EVERY DAY  30 each  6    Past Medical History  Diagnosis Date  . Stricture and stenosis of esophagus   . Esophageal reflux   . Type II or unspecified type diabetes mellitus without mention of complication, not stated as uncontrolled   . Cardiomegaly   . Unspecified essential hypertension   . Obesity     Past Surgical History  Procedure Date  . Removal of cyst in breast     Family History  Problem Relation Age of Onset  . Heart disease Mother   . Heart disease Father   . Prostate cancer Father     History   Social History  . Marital Status: Married   Spouse Name: N/A    Number of Children: N/A  . Years of Education: N/A   Occupational History  . Not on file.   Social History Main Topics  . Smoking status: Never Smoker   . Smokeless tobacco: Not on file  . Alcohol Use: Yes     2-3 per  weel   . Drug Use: No  . Sexually Active: Not on file   Other Topics Concern  . Not on file   Social History Narrative   Has never smoked. Daily Caffeine Use- 2 cups per day. Does not get regular exercise. Retired.     Review of Systems:  All systems reviewed.  They are negative to the above problem except as previously stated.  Vital Signs: BP 122/80  Pulse 69  Ht 5' 1.5" (1.562 m)  Wt 258 lb 6.4 oz (117.209 kg)  BMI 48.03 kg/m2  Physical Exam Patient is in NAD HEENT:  Normocephalic, atraumatic. EOMI, PERRLA.  Neck: JVP is normal. No thyromegaly. No bruits.  Lungs: clear to auscultation. No rales  no wheezes.  Heart: Regular rate and rhythm. Normal S1, S2. No S3.   No significant murmurs. PMI not displaced.  Abdomen:  Supple, nontender. Normal bowel sounds. No masses. No hepatomegaly.  Extremities:   Good distal pulses throughout. No lower extremity edema.  Musculoskeletal :moving all extremities.  Neuro:   alert and oriented x3.  CN II-XII grossly intact.  EKG:  SR 69 bpm. Assessment and Plan:  1.  HTN  Good control  2.  HL  Needs to stay on statin.  I will get labs from primary MD  3.  DM  COunselled on wt loss and exercise.  She needs to lose wt.  Would modify all of her medical problems.    Wil set to see her in 1 year, sooner if prob develop.  Will call her when I see labs.

## 2012-03-27 NOTE — Patient Instructions (Signed)
Your physician recommends that you schedule a follow-up appointment in: 12 months, the office will mail you a reminder letter 2 months prior your appointment date. Your physician recommends that you continue on your current medications as directed. Please refer to the Current Medication list given to you today.

## 2012-03-28 ENCOUNTER — Ambulatory Visit: Payer: BC Managed Care – PPO | Admitting: Internal Medicine

## 2012-04-02 ENCOUNTER — Encounter: Payer: Self-pay | Admitting: *Deleted

## 2012-04-22 ENCOUNTER — Ambulatory Visit (INDEPENDENT_AMBULATORY_CARE_PROVIDER_SITE_OTHER): Payer: BC Managed Care – PPO | Admitting: Internal Medicine

## 2012-04-22 ENCOUNTER — Encounter: Payer: Self-pay | Admitting: Internal Medicine

## 2012-04-22 VITALS — BP 110/74 | HR 72 | Ht 61.5 in | Wt 260.0 lb

## 2012-04-22 DIAGNOSIS — K219 Gastro-esophageal reflux disease without esophagitis: Secondary | ICD-10-CM

## 2012-04-22 MED ORDER — ESOMEPRAZOLE MAGNESIUM 40 MG PO CPDR: 40.0000 mg | DELAYED_RELEASE_CAPSULE | Freq: Every day | ORAL | Status: DC

## 2012-04-22 NOTE — Patient Instructions (Signed)
Dr Joycelyn Rua

## 2012-04-22 NOTE — Progress Notes (Signed)
Madison Holden [facility] 1946-02-12 MRN [id number]   History of Present Illness:  This is a 66 year old white female with chronic gastroesophageal reflux and a history of esophageal stricture dilated in June 2006 with 48 French Maloney dilator. She is doing well on Nexium 40 mg daily. She denies nocturnal cough, hoarseness or regurgitation. Her last screening colonoscopy in June 2010 showed a tubular adenoma. She will be due for a recall colonoscopy in June 2015. She is here today to refills of her Nexium.   Past Medical History  Diagnosis Date  . Esophageal stricture   . Esophageal reflux   . Diabetes mellitus   . Cardiomegaly   . Unspecified essential hypertension   . Obesity   . Hyperlipidemia   . Urine frequency    Past Surgical History  Procedure Date  . Removal of cyst in breast     right  . Tonsillectomy     reports that she has never smoked. She has never used smokeless tobacco. She reports that she drinks alcohol. She reports that she does not use illicit drugs. family history includes Diabetes in her maternal grandfather; Heart disease in her father and mother; and Prostate cancer in her father.  There is no history of Colon cancer. Allergies  Allergen Reactions  . Codeine         Review of Systems: Denies hoarseness cough dysphagia shortness of breath  The remainder of the 10 point ROS is negative except as outlined in H&P   Physical Exam: General appearance  Well developed, in no distress. Psychological normal mood and affect.  Assessment and Plan:  Problem #1 Chronic gastroesophageal reflux is under good control with Nexium 40 mg daily. We will refill her prescription for another year. I have encouraged antireflux measures including weight loss.  Problem #2 History of adenomatous colon polyps. Patient's last colonoscopy was in June 2010. A recall colonoscopy will be due in June 2015.   04/22/2012 Lina Sar

## 2012-04-23 ENCOUNTER — Encounter: Payer: Self-pay | Admitting: Internal Medicine

## 2013-03-31 ENCOUNTER — Ambulatory Visit (INDEPENDENT_AMBULATORY_CARE_PROVIDER_SITE_OTHER): Payer: BC Managed Care – PPO | Admitting: Ophthalmology

## 2013-04-08 ENCOUNTER — Ambulatory Visit (INDEPENDENT_AMBULATORY_CARE_PROVIDER_SITE_OTHER): Payer: BC Managed Care – PPO | Admitting: Ophthalmology

## 2013-04-08 DIAGNOSIS — H43819 Vitreous degeneration, unspecified eye: Secondary | ICD-10-CM

## 2013-04-08 DIAGNOSIS — H35039 Hypertensive retinopathy, unspecified eye: Secondary | ICD-10-CM

## 2013-04-08 DIAGNOSIS — E1139 Type 2 diabetes mellitus with other diabetic ophthalmic complication: Secondary | ICD-10-CM

## 2013-04-08 DIAGNOSIS — E11319 Type 2 diabetes mellitus with unspecified diabetic retinopathy without macular edema: Secondary | ICD-10-CM

## 2013-04-08 DIAGNOSIS — H2510 Age-related nuclear cataract, unspecified eye: Secondary | ICD-10-CM

## 2013-04-08 DIAGNOSIS — I10 Essential (primary) hypertension: Secondary | ICD-10-CM

## 2013-04-08 DIAGNOSIS — H2700 Aphakia, unspecified eye: Secondary | ICD-10-CM

## 2013-09-30 ENCOUNTER — Other Ambulatory Visit: Payer: Self-pay | Admitting: Internal Medicine

## 2014-02-03 ENCOUNTER — Other Ambulatory Visit: Payer: Self-pay | Admitting: Internal Medicine

## 2014-02-23 ENCOUNTER — Other Ambulatory Visit: Payer: Self-pay | Admitting: Internal Medicine

## 2014-03-24 ENCOUNTER — Other Ambulatory Visit: Payer: Self-pay | Admitting: Obstetrics and Gynecology

## 2014-03-25 LAB — CYTOLOGY - PAP

## 2014-04-06 ENCOUNTER — Ambulatory Visit (INDEPENDENT_AMBULATORY_CARE_PROVIDER_SITE_OTHER): Payer: BC Managed Care – PPO | Admitting: Ophthalmology

## 2014-04-22 ENCOUNTER — Ambulatory Visit (INDEPENDENT_AMBULATORY_CARE_PROVIDER_SITE_OTHER): Payer: Medicare Other | Admitting: Ophthalmology

## 2014-04-22 DIAGNOSIS — H43819 Vitreous degeneration, unspecified eye: Secondary | ICD-10-CM

## 2014-04-22 DIAGNOSIS — H35039 Hypertensive retinopathy, unspecified eye: Secondary | ICD-10-CM

## 2014-04-22 DIAGNOSIS — I10 Essential (primary) hypertension: Secondary | ICD-10-CM

## 2014-04-22 DIAGNOSIS — H2510 Age-related nuclear cataract, unspecified eye: Secondary | ICD-10-CM

## 2014-04-22 DIAGNOSIS — E1139 Type 2 diabetes mellitus with other diabetic ophthalmic complication: Secondary | ICD-10-CM

## 2014-04-22 DIAGNOSIS — E11319 Type 2 diabetes mellitus with unspecified diabetic retinopathy without macular edema: Secondary | ICD-10-CM

## 2014-04-22 DIAGNOSIS — E1165 Type 2 diabetes mellitus with hyperglycemia: Secondary | ICD-10-CM

## 2014-07-28 ENCOUNTER — Encounter: Payer: Self-pay | Admitting: Internal Medicine

## 2014-07-28 ENCOUNTER — Telehealth: Payer: Self-pay | Admitting: Internal Medicine

## 2014-07-28 NOTE — Telephone Encounter (Signed)
Her recall letter from 03/2009 actually says recall in 5 years!, so she should have gotten a recall in June 2015.

## 2014-07-28 NOTE — Telephone Encounter (Signed)
Scheduled appt with office and paper work sent to pt

## 2014-07-28 NOTE — Telephone Encounter (Signed)
Patient had colonoscopy in 2010 with tubular adenoma. The recall in EPIC is for 2020 but her PCP states she needs a colonoscopy now. Looks as if 5 year recall was recommended in 2010 to patient. Please, advise.

## 2014-07-28 NOTE — Telephone Encounter (Signed)
Per Dr. Olevia Perches, patient should have had recall in June 2015. Please, schedule colonoscopy.

## 2014-10-01 ENCOUNTER — Encounter: Payer: BC Managed Care – PPO | Admitting: Internal Medicine

## 2014-11-03 ENCOUNTER — Ambulatory Visit: Payer: Medicare Other

## 2014-11-03 ENCOUNTER — Telehealth: Payer: Self-pay

## 2014-11-03 VITALS — Ht 60.5 in | Wt 266.6 lb

## 2014-11-03 DIAGNOSIS — Z860100 Personal history of colon polyps, unspecified: Secondary | ICD-10-CM

## 2014-11-03 MED ORDER — MOVIPREP 100 G PO SOLR
1.0000 | Freq: Once | ORAL | Status: DC
Start: 2014-11-03 — End: 2014-12-29

## 2014-11-03 NOTE — Telephone Encounter (Signed)
BMI>51  Do you want an OV or direct hospital?  Pt already had previsit.

## 2014-11-03 NOTE — Progress Notes (Signed)
No allergies to eggs or soy No home oxygen No diet/weight loss meds No past problems with anesthesia  No email

## 2014-11-03 NOTE — Telephone Encounter (Signed)
OV please

## 2014-11-04 NOTE — Telephone Encounter (Signed)
Spoke with patient and cancelled Perkins colon. Scheduled OV with Dr. Olevia Perches on 11/12/14 at 3:15 PM.

## 2014-11-11 ENCOUNTER — Ambulatory Visit (INDEPENDENT_AMBULATORY_CARE_PROVIDER_SITE_OTHER): Payer: Self-pay | Admitting: Internal Medicine

## 2014-11-11 ENCOUNTER — Encounter: Payer: Self-pay | Admitting: Internal Medicine

## 2014-11-11 VITALS — BP 136/70 | HR 80 | Ht 61.0 in | Wt 263.0 lb

## 2014-11-11 DIAGNOSIS — Z86010 Personal history of colonic polyps: Secondary | ICD-10-CM

## 2014-11-11 NOTE — Patient Instructions (Addendum)
You have been scheduled for a colonoscopy. Please follow written instructions given to you at your visit today.  Please pick up your prep kit at the pharmacy within the next 1-3 days. If you use inhalers (even only as needed), please bring them with you on the day of your procedure. Your physician has requested that you go to www.startemmi.com and enter the access code given to you at your visit today. This web site gives a general overview about your procedure. However, you should still follow specific instructions given to you by our office regarding your preparation for the procedure.  Dr Christella Noa

## 2014-11-11 NOTE — Progress Notes (Signed)
Madison Holden [facility] Mar 04, 1946 [id number]  Note: This dictation was prepared with Dragon digital system. Any transcriptional errors that result from this procedure are unintentional.   History of Present Illness: This is a 69 year old white female here to discuss  screening colonoscopy. She had a tubular adenoma removed from right colon in June 2010. She has a history of gastroesophageal reflux and underwent upper endoscopy in June 2006 with findings of  mild esophagitis. She is overweight.    Past Medical History  Diagnosis Date  . Esophageal stricture   . Esophageal reflux   . Diabetes mellitus   . Cardiomegaly   . Unspecified essential hypertension   . Obesity   . Hyperlipidemia   . Urine frequency   . Adenomatous colon polyp     tubular    Past Surgical History  Procedure Laterality Date  . Removal of cyst in breast      right  . Tonsillectomy      No Known Allergies  Family history and social history have been reviewed.  Review of Systems: Denies heartburn abdominal pain diarrhea or constipation  The remainder of the 10 point ROS is negative except as outlined in the H&P  Physical Exam: General Appearance Well developed, in no distress, overweight Eyes  Non icteric  HEENT  Non traumatic, normocephalic  Mouth No lesion, tongue papillated, no cheilosis Neck Supple without adenopathy, thyroid not enlarged, no carotid bruits, no JVD Lungs Clear to auscultation bilaterally COR Normal S1, normal S2, regular rhythm, no murmur, quiet precordium Abdomen soft nontender obese Rectal not done Extremities  No pedal edema Skin No lesions Neurological Alert and oriented x 3 Psychological Normal mood and affect  Assessment and Plan:   89 year white female who is due for surveillance colonoscopy to follow-up on tubular adenoma of the right colon from 2010. Her BMI is 49. Weight 263 pounds. She has lost about 5 pounds since last visit. Height 5 feet 1 inch. We will schedule  her for colonoscopy in at least see. She's been instructed in MiraLAX prep. Patient is a diabetic. We will adjust her diabetic medications prior to colonoscopy    [name] @TODAY (<PARAMETER> error)@

## 2014-11-12 ENCOUNTER — Ambulatory Visit: Payer: Medicare Other | Admitting: Internal Medicine

## 2014-11-17 ENCOUNTER — Encounter: Payer: BC Managed Care – PPO | Admitting: Internal Medicine

## 2014-11-19 ENCOUNTER — Encounter: Payer: Self-pay | Admitting: Internal Medicine

## 2014-12-13 ENCOUNTER — Encounter: Payer: Self-pay | Admitting: Internal Medicine

## 2014-12-29 ENCOUNTER — Encounter: Payer: Self-pay | Admitting: Internal Medicine

## 2014-12-29 ENCOUNTER — Ambulatory Visit (AMBULATORY_SURGERY_CENTER): Payer: Medicare Other | Admitting: Internal Medicine

## 2014-12-29 VITALS — BP 119/53 | HR 72 | Temp 97.7°F | Resp 20 | Ht 61.0 in | Wt 263.0 lb

## 2014-12-29 DIAGNOSIS — Z86010 Personal history of colonic polyps: Secondary | ICD-10-CM

## 2014-12-29 MED ORDER — SODIUM CHLORIDE 0.9 % IV SOLN: 500.0000 mL | INTRAVENOUS | Status: DC

## 2014-12-29 NOTE — Op Note (Signed)
[facility]  [address] [place], [address]   COLONOSCOPY PROCEDURE REPORT  PATIENT: Madison Holden, Madison Holden  MR#: #[id number] BIRTHDATE: Aug 05, 1946 , 81  yrs. old GENDER: female ENDOSCOPIST: Lafayette Dragon, MD REFERRED JM:EQASTMH Olen Pel, M.D. PROCEDURE DATE:  12/29/2014 PROCEDURE:   Colonoscopy, surveillance First Screening Colonoscopy - Avg.  risk and is 50 yrs.  old or older - No.  Prior Negative Screening - Now for repeat screening. N/A  History of Adenoma - Now for follow-up colonoscopy & has been > or = to 3 yrs.  Yes hx of adenoma.  Has been 3 or more years since last colonoscopy.  Polyps Removed Today? No.  Polyps Removed Today? No.  Recommend repeat exam, <10 yrs? Polyps Removed Today? No.  Recommend repeat exam, <10 yrs? No. ASA CLASS:   Class II INDICATIONS:Surveillance due to prior colonic neoplasia and Average Risk. MEDICATIONS: Monitored anesthesia care and Propofol 400 mg IV  DESCRIPTION OF PROCEDURE:   After the risks benefits and alternatives of the procedure were thoroughly explained, informed consent was obtained.  The digital rectal exam revealed no abnormalities of the rectum.   The LB [id number]  endoscope was introduced through the anus and advanced to the cecum, which was identified by both the appendix and ileocecal valve. No adverse events experienced.   The quality of the prep was good.  (MoviPrep was used)  The instrument was then slowly withdrawn as the colon was fully examined.      COLON FINDINGS: A normal appearing cecum, ileocecal valve, and appendiceal orifice were identified.  The ascending, transverse, descending, sigmoid colon, and rectum appeared unremarkable. Retroflexed views revealed no abnormalities. The time to cecum = 14.25 Withdrawal time = 6.09   The scope was withdrawn and the procedure completed. COMPLICATIONS: There were no immediate complications.  ENDOSCOPIC IMPRESSION: Normal  colonoscopy  RECOMMENDATIONS: High fiber diet Recall colonoscopy in 10 years  eSigned:  Lafayette Dragon, MD 12/29/2014 9:04 AM   cc:

## 2014-12-29 NOTE — Progress Notes (Signed)
A/ox3, pleased with MAC, report to RN 

## 2014-12-29 NOTE — Patient Instructions (Signed)
YOU HAD AN ENDOSCOPIC PROCEDURE TODAY AT THE [facility]:   Refer to the procedure report that was given to you for any specific questions about what was found during the examination.  If the procedure report does not answer your questions, please call your gastroenterologist to clarify.  If you requested that your care partner not be given the details of your procedure findings, then the procedure report has been included in a sealed envelope for you to review at your convenience later.  YOU SHOULD EXPECT: Some feelings of bloating in the abdomen. Passage of more gas than usual.  Walking can help get rid of the air that was put into your GI tract during the procedure and reduce the bloating. If you had a lower endoscopy (such as a colonoscopy or flexible sigmoidoscopy) you may notice spotting of blood in your stool or on the toilet paper. If you underwent a bowel prep for your procedure, you may not have a normal bowel movement for a few days.  Please Note:  You might notice some irritation and congestion in your nose or some drainage.  This is from the oxygen used during your procedure.  There is no need for concern and it should clear up in a day or so.  SYMPTOMS TO REPORT IMMEDIATELY:   Following lower endoscopy (colonoscopy or flexible sigmoidoscopy):  Excessive amounts of blood in the stool  Significant tenderness or worsening of abdominal pains  Swelling of the abdomen that is new, acute  Fever of 100F or higher  For urgent or emergent issues, a gastroenterologist can be reached at any hour by calling [phone].   DIET: Your first meal following the procedure should be a small meal and then it is ok to progress to your normal diet. Heavy or fried foods are harder to digest and may make you feel nauseous or bloated.  Likewise, meals heavy in dairy and vegetables can increase bloating.  Drink plenty of fluids but you should avoid alcoholic beverages for 24  hours.  ACTIVITY:  You should plan to take it easy for the rest of today and you should NOT DRIVE or use heavy machinery until tomorrow (because of the sedation medicines used during the test).    FOLLOW UP: Our staff will call the number listed on your records the next business day following your procedure to check on you and address any questions or concerns that you may have regarding the information given to you following your procedure. If we do not reach you, we will leave a message.  However, if you are feeling well and you are not experiencing any problems, there is no need to return our call.  We will assume that you have returned to your regular daily activities without incident.  If any biopsies were taken you will be contacted by phone or by letter within the next 1-3 weeks.  Please call us at [phone] if you have not heard about the biopsies in 3 weeks.    SIGNATURES/CONFIDENTIALITY: You and/or your care partner have signed paperwork which will be entered into your electronic medical record.  These signatures attest to the fact that that the information above on your After Visit Summary has been reviewed and is understood.  Full responsibility of the confidentiality of this discharge information lies with you and/or your care-partner.  Normal exam.  Repeat colonoscopy in 10 years 2026.  

## 2014-12-30 ENCOUNTER — Telehealth: Payer: Self-pay | Admitting: *Deleted

## 2014-12-30 NOTE — Telephone Encounter (Signed)
  Follow up Call-  Call back number 12/29/2014  Post procedure Call Back phone  # [phone]  Permission to leave phone message Yes     Patient questions:  Message left to call us if necessary.

## 2015-03-29 ENCOUNTER — Ambulatory Visit (INDEPENDENT_AMBULATORY_CARE_PROVIDER_SITE_OTHER): Payer: Medicare Other | Admitting: Ophthalmology

## 2015-03-29 DIAGNOSIS — E11319 Type 2 diabetes mellitus with unspecified diabetic retinopathy without macular edema: Secondary | ICD-10-CM | POA: Diagnosis not present

## 2015-03-29 DIAGNOSIS — H2512 Age-related nuclear cataract, left eye: Secondary | ICD-10-CM | POA: Diagnosis not present

## 2015-03-29 DIAGNOSIS — E11329 Type 2 diabetes mellitus with mild nonproliferative diabetic retinopathy without macular edema: Secondary | ICD-10-CM

## 2015-03-29 DIAGNOSIS — D3132 Benign neoplasm of left choroid: Secondary | ICD-10-CM

## 2015-03-29 DIAGNOSIS — H35033 Hypertensive retinopathy, bilateral: Secondary | ICD-10-CM | POA: Diagnosis not present

## 2015-03-29 DIAGNOSIS — I10 Essential (primary) hypertension: Secondary | ICD-10-CM

## 2015-03-29 DIAGNOSIS — H43813 Vitreous degeneration, bilateral: Secondary | ICD-10-CM | POA: Diagnosis not present

## 2015-04-13 ENCOUNTER — Ambulatory Visit (INDEPENDENT_AMBULATORY_CARE_PROVIDER_SITE_OTHER): Payer: No Typology Code available for payment source | Admitting: Licensed Clinical Social Worker

## 2015-04-13 DIAGNOSIS — F321 Major depressive disorder, single episode, moderate: Secondary | ICD-10-CM

## 2015-05-04 ENCOUNTER — Ambulatory Visit (INDEPENDENT_AMBULATORY_CARE_PROVIDER_SITE_OTHER): Payer: No Typology Code available for payment source | Admitting: Licensed Clinical Social Worker

## 2015-05-04 DIAGNOSIS — F321 Major depressive disorder, single episode, moderate: Secondary | ICD-10-CM

## 2015-05-16 ENCOUNTER — Encounter: Payer: Self-pay | Admitting: Internal Medicine

## 2015-05-23 ENCOUNTER — Ambulatory Visit (INDEPENDENT_AMBULATORY_CARE_PROVIDER_SITE_OTHER): Payer: No Typology Code available for payment source | Admitting: Licensed Clinical Social Worker

## 2015-05-23 DIAGNOSIS — F321 Major depressive disorder, single episode, moderate: Secondary | ICD-10-CM

## 2015-06-15 ENCOUNTER — Ambulatory Visit (INDEPENDENT_AMBULATORY_CARE_PROVIDER_SITE_OTHER): Payer: No Typology Code available for payment source | Admitting: Licensed Clinical Social Worker

## 2015-06-15 DIAGNOSIS — F321 Major depressive disorder, single episode, moderate: Secondary | ICD-10-CM

## 2015-07-20 ENCOUNTER — Ambulatory Visit (INDEPENDENT_AMBULATORY_CARE_PROVIDER_SITE_OTHER): Payer: No Typology Code available for payment source | Admitting: Licensed Clinical Social Worker

## 2015-07-20 DIAGNOSIS — F321 Major depressive disorder, single episode, moderate: Secondary | ICD-10-CM

## 2016-01-19 DIAGNOSIS — E11319 Type 2 diabetes mellitus with unspecified diabetic retinopathy without macular edema: Secondary | ICD-10-CM | POA: Diagnosis not present

## 2016-01-19 DIAGNOSIS — E7800 Pure hypercholesterolemia, unspecified: Secondary | ICD-10-CM | POA: Diagnosis not present

## 2016-01-19 DIAGNOSIS — Z6841 Body Mass Index (BMI) 40.0 and over, adult: Secondary | ICD-10-CM | POA: Diagnosis not present

## 2016-01-19 DIAGNOSIS — I10 Essential (primary) hypertension: Secondary | ICD-10-CM | POA: Diagnosis not present

## 2016-01-19 DIAGNOSIS — Z7984 Long term (current) use of oral hypoglycemic drugs: Secondary | ICD-10-CM | POA: Diagnosis not present

## 2016-01-19 DIAGNOSIS — N3281 Overactive bladder: Secondary | ICD-10-CM | POA: Diagnosis not present

## 2016-01-19 DIAGNOSIS — K219 Gastro-esophageal reflux disease without esophagitis: Secondary | ICD-10-CM | POA: Diagnosis not present

## 2016-02-07 DIAGNOSIS — E11319 Type 2 diabetes mellitus with unspecified diabetic retinopathy without macular edema: Secondary | ICD-10-CM | POA: Diagnosis not present

## 2016-03-12 ENCOUNTER — Ambulatory Visit (INDEPENDENT_AMBULATORY_CARE_PROVIDER_SITE_OTHER): Payer: Medicare Other | Admitting: Ophthalmology

## 2016-03-12 DIAGNOSIS — H2512 Age-related nuclear cataract, left eye: Secondary | ICD-10-CM | POA: Diagnosis not present

## 2016-03-12 DIAGNOSIS — D3132 Benign neoplasm of left choroid: Secondary | ICD-10-CM | POA: Diagnosis not present

## 2016-03-12 DIAGNOSIS — H43813 Vitreous degeneration, bilateral: Secondary | ICD-10-CM | POA: Diagnosis not present

## 2016-03-12 DIAGNOSIS — E113293 Type 2 diabetes mellitus with mild nonproliferative diabetic retinopathy without macular edema, bilateral: Secondary | ICD-10-CM | POA: Diagnosis not present

## 2016-03-12 DIAGNOSIS — H35033 Hypertensive retinopathy, bilateral: Secondary | ICD-10-CM

## 2016-03-12 DIAGNOSIS — E11319 Type 2 diabetes mellitus with unspecified diabetic retinopathy without macular edema: Secondary | ICD-10-CM | POA: Diagnosis not present

## 2016-03-12 DIAGNOSIS — I10 Essential (primary) hypertension: Secondary | ICD-10-CM

## 2016-03-28 DIAGNOSIS — H2512 Age-related nuclear cataract, left eye: Secondary | ICD-10-CM | POA: Diagnosis not present

## 2016-03-28 DIAGNOSIS — E113292 Type 2 diabetes mellitus with mild nonproliferative diabetic retinopathy without macular edema, left eye: Secondary | ICD-10-CM | POA: Diagnosis not present

## 2016-03-28 DIAGNOSIS — H25012 Cortical age-related cataract, left eye: Secondary | ICD-10-CM | POA: Diagnosis not present

## 2016-03-28 DIAGNOSIS — E113291 Type 2 diabetes mellitus with mild nonproliferative diabetic retinopathy without macular edema, right eye: Secondary | ICD-10-CM | POA: Diagnosis not present

## 2016-03-28 DIAGNOSIS — H35032 Hypertensive retinopathy, left eye: Secondary | ICD-10-CM | POA: Diagnosis not present

## 2016-03-28 DIAGNOSIS — H35031 Hypertensive retinopathy, right eye: Secondary | ICD-10-CM | POA: Diagnosis not present

## 2016-03-30 DIAGNOSIS — Z1231 Encounter for screening mammogram for malignant neoplasm of breast: Secondary | ICD-10-CM | POA: Diagnosis not present

## 2016-03-30 DIAGNOSIS — Z01419 Encounter for gynecological examination (general) (routine) without abnormal findings: Secondary | ICD-10-CM | POA: Diagnosis not present

## 2016-04-12 DIAGNOSIS — H26491 Other secondary cataract, right eye: Secondary | ICD-10-CM | POA: Diagnosis not present

## 2016-04-23 DIAGNOSIS — H04123 Dry eye syndrome of bilateral lacrimal glands: Secondary | ICD-10-CM | POA: Diagnosis not present

## 2016-04-29 DIAGNOSIS — E11319 Type 2 diabetes mellitus with unspecified diabetic retinopathy without macular edema: Secondary | ICD-10-CM | POA: Diagnosis not present

## 2016-05-08 DIAGNOSIS — H25812 Combined forms of age-related cataract, left eye: Secondary | ICD-10-CM | POA: Diagnosis not present

## 2016-05-08 DIAGNOSIS — H2512 Age-related nuclear cataract, left eye: Secondary | ICD-10-CM | POA: Diagnosis not present

## 2016-05-10 DIAGNOSIS — R350 Frequency of micturition: Secondary | ICD-10-CM | POA: Diagnosis not present

## 2016-05-10 DIAGNOSIS — N3946 Mixed incontinence: Secondary | ICD-10-CM | POA: Diagnosis not present

## 2016-06-06 DIAGNOSIS — E11319 Type 2 diabetes mellitus with unspecified diabetic retinopathy without macular edema: Secondary | ICD-10-CM | POA: Diagnosis not present

## 2016-07-19 DIAGNOSIS — E11319 Type 2 diabetes mellitus with unspecified diabetic retinopathy without macular edema: Secondary | ICD-10-CM | POA: Diagnosis not present

## 2016-07-19 DIAGNOSIS — M79642 Pain in left hand: Secondary | ICD-10-CM | POA: Diagnosis not present

## 2016-07-19 DIAGNOSIS — N3281 Overactive bladder: Secondary | ICD-10-CM | POA: Diagnosis not present

## 2016-07-19 DIAGNOSIS — I10 Essential (primary) hypertension: Secondary | ICD-10-CM | POA: Diagnosis not present

## 2016-07-19 DIAGNOSIS — E7800 Pure hypercholesterolemia, unspecified: Secondary | ICD-10-CM | POA: Diagnosis not present

## 2016-08-27 DIAGNOSIS — E11319 Type 2 diabetes mellitus with unspecified diabetic retinopathy without macular edema: Secondary | ICD-10-CM | POA: Diagnosis not present

## 2017-01-29 DIAGNOSIS — E7800 Pure hypercholesterolemia, unspecified: Secondary | ICD-10-CM | POA: Diagnosis not present

## 2017-01-29 DIAGNOSIS — I10 Essential (primary) hypertension: Secondary | ICD-10-CM | POA: Diagnosis not present

## 2017-01-29 DIAGNOSIS — E11319 Type 2 diabetes mellitus with unspecified diabetic retinopathy without macular edema: Secondary | ICD-10-CM | POA: Diagnosis not present

## 2017-01-29 DIAGNOSIS — Z1159 Encounter for screening for other viral diseases: Secondary | ICD-10-CM | POA: Diagnosis not present

## 2017-02-01 ENCOUNTER — Other Ambulatory Visit (HOSPITAL_BASED_OUTPATIENT_CLINIC_OR_DEPARTMENT_OTHER): Payer: Self-pay | Admitting: Family Medicine

## 2017-02-01 DIAGNOSIS — Z780 Asymptomatic menopausal state: Secondary | ICD-10-CM

## 2017-04-01 DIAGNOSIS — Z1231 Encounter for screening mammogram for malignant neoplasm of breast: Secondary | ICD-10-CM | POA: Diagnosis not present

## 2017-04-12 ENCOUNTER — Ambulatory Visit (HOSPITAL_BASED_OUTPATIENT_CLINIC_OR_DEPARTMENT_OTHER)
Admission: RE | Admit: 2017-04-12 | Discharge: 2017-04-12 | Disposition: A | Discharge status: Home / Self Care | Payer: Medicare Other | Source: Ambulatory Visit | Attending: Family Medicine | Admitting: Family Medicine

## 2017-04-12 DIAGNOSIS — Z13820 Encounter for screening for osteoporosis: Secondary | ICD-10-CM | POA: Insufficient documentation

## 2017-04-12 DIAGNOSIS — Z780 Asymptomatic menopausal state: Secondary | ICD-10-CM | POA: Insufficient documentation

## 2017-04-22 DIAGNOSIS — Z961 Presence of intraocular lens: Secondary | ICD-10-CM | POA: Diagnosis not present

## 2017-04-22 DIAGNOSIS — E113293 Type 2 diabetes mellitus with mild nonproliferative diabetic retinopathy without macular edema, bilateral: Secondary | ICD-10-CM | POA: Diagnosis not present

## 2017-04-22 DIAGNOSIS — I708 Atherosclerosis of other arteries: Secondary | ICD-10-CM | POA: Diagnosis not present

## 2017-04-22 DIAGNOSIS — H35033 Hypertensive retinopathy, bilateral: Secondary | ICD-10-CM | POA: Diagnosis not present

## 2017-05-06 ENCOUNTER — Ambulatory Visit (INDEPENDENT_AMBULATORY_CARE_PROVIDER_SITE_OTHER): Payer: Medicare Other | Admitting: Ophthalmology

## 2017-05-06 DIAGNOSIS — E113293 Type 2 diabetes mellitus with mild nonproliferative diabetic retinopathy without macular edema, bilateral: Secondary | ICD-10-CM | POA: Diagnosis not present

## 2017-05-06 DIAGNOSIS — D3132 Benign neoplasm of left choroid: Secondary | ICD-10-CM | POA: Diagnosis not present

## 2017-05-06 DIAGNOSIS — H35033 Hypertensive retinopathy, bilateral: Secondary | ICD-10-CM | POA: Diagnosis not present

## 2017-05-06 DIAGNOSIS — I10 Essential (primary) hypertension: Secondary | ICD-10-CM | POA: Diagnosis not present

## 2017-05-06 DIAGNOSIS — H43813 Vitreous degeneration, bilateral: Secondary | ICD-10-CM | POA: Diagnosis not present

## 2017-05-06 DIAGNOSIS — E11319 Type 2 diabetes mellitus with unspecified diabetic retinopathy without macular edema: Secondary | ICD-10-CM

## 2017-07-22 DIAGNOSIS — E7800 Pure hypercholesterolemia, unspecified: Secondary | ICD-10-CM | POA: Diagnosis not present

## 2017-07-22 DIAGNOSIS — I10 Essential (primary) hypertension: Secondary | ICD-10-CM | POA: Diagnosis not present

## 2017-07-22 DIAGNOSIS — E11319 Type 2 diabetes mellitus with unspecified diabetic retinopathy without macular edema: Secondary | ICD-10-CM | POA: Diagnosis not present

## 2017-11-22 HISTORY — PX: MOUTH SURGERY: SHX715

## 2018-01-31 DIAGNOSIS — M79642 Pain in left hand: Secondary | ICD-10-CM | POA: Diagnosis not present

## 2018-01-31 DIAGNOSIS — E11319 Type 2 diabetes mellitus with unspecified diabetic retinopathy without macular edema: Secondary | ICD-10-CM | POA: Diagnosis not present

## 2018-01-31 DIAGNOSIS — E7800 Pure hypercholesterolemia, unspecified: Secondary | ICD-10-CM | POA: Diagnosis not present

## 2018-01-31 DIAGNOSIS — I10 Essential (primary) hypertension: Secondary | ICD-10-CM | POA: Diagnosis not present

## 2018-03-05 DIAGNOSIS — Z6841 Body Mass Index (BMI) 40.0 and over, adult: Secondary | ICD-10-CM | POA: Diagnosis not present

## 2018-03-05 DIAGNOSIS — M2550 Pain in unspecified joint: Secondary | ICD-10-CM | POA: Diagnosis not present

## 2018-03-05 DIAGNOSIS — R5382 Chronic fatigue, unspecified: Secondary | ICD-10-CM | POA: Diagnosis not present

## 2018-03-05 DIAGNOSIS — M7989 Other specified soft tissue disorders: Secondary | ICD-10-CM | POA: Diagnosis not present

## 2018-03-19 DIAGNOSIS — M2550 Pain in unspecified joint: Secondary | ICD-10-CM | POA: Diagnosis not present

## 2018-03-19 DIAGNOSIS — Z6841 Body Mass Index (BMI) 40.0 and over, adult: Secondary | ICD-10-CM | POA: Diagnosis not present

## 2018-03-19 DIAGNOSIS — M1A09X0 Idiopathic chronic gout, multiple sites, without tophus (tophi): Secondary | ICD-10-CM | POA: Diagnosis not present

## 2018-03-26 DIAGNOSIS — Z124 Encounter for screening for malignant neoplasm of cervix: Secondary | ICD-10-CM | POA: Diagnosis not present

## 2018-03-26 DIAGNOSIS — R87619 Unspecified abnormal cytological findings in specimens from cervix uteri: Secondary | ICD-10-CM | POA: Diagnosis not present

## 2018-03-26 DIAGNOSIS — Z01419 Encounter for gynecological examination (general) (routine) without abnormal findings: Secondary | ICD-10-CM | POA: Diagnosis not present

## 2018-04-02 ENCOUNTER — Encounter (INDEPENDENT_AMBULATORY_CARE_PROVIDER_SITE_OTHER): Payer: Medicare Other | Admitting: Ophthalmology

## 2018-04-02 DIAGNOSIS — I10 Essential (primary) hypertension: Secondary | ICD-10-CM | POA: Diagnosis not present

## 2018-04-02 DIAGNOSIS — H43813 Vitreous degeneration, bilateral: Secondary | ICD-10-CM | POA: Diagnosis not present

## 2018-04-02 DIAGNOSIS — D3132 Benign neoplasm of left choroid: Secondary | ICD-10-CM

## 2018-04-02 DIAGNOSIS — E11319 Type 2 diabetes mellitus with unspecified diabetic retinopathy without macular edema: Secondary | ICD-10-CM

## 2018-04-02 DIAGNOSIS — E113293 Type 2 diabetes mellitus with mild nonproliferative diabetic retinopathy without macular edema, bilateral: Secondary | ICD-10-CM

## 2018-04-02 DIAGNOSIS — H35033 Hypertensive retinopathy, bilateral: Secondary | ICD-10-CM

## 2018-04-10 DIAGNOSIS — M2550 Pain in unspecified joint: Secondary | ICD-10-CM | POA: Diagnosis not present

## 2018-04-10 DIAGNOSIS — R5382 Chronic fatigue, unspecified: Secondary | ICD-10-CM | POA: Diagnosis not present

## 2018-04-10 DIAGNOSIS — M1A09X0 Idiopathic chronic gout, multiple sites, without tophus (tophi): Secondary | ICD-10-CM | POA: Diagnosis not present

## 2018-05-08 DIAGNOSIS — R5382 Chronic fatigue, unspecified: Secondary | ICD-10-CM | POA: Diagnosis not present

## 2018-05-08 DIAGNOSIS — M1A09X0 Idiopathic chronic gout, multiple sites, without tophus (tophi): Secondary | ICD-10-CM | POA: Diagnosis not present

## 2018-05-08 DIAGNOSIS — M2550 Pain in unspecified joint: Secondary | ICD-10-CM | POA: Diagnosis not present

## 2018-05-13 DIAGNOSIS — R87619 Unspecified abnormal cytological findings in specimens from cervix uteri: Secondary | ICD-10-CM | POA: Diagnosis not present

## 2018-05-13 DIAGNOSIS — Z1231 Encounter for screening mammogram for malignant neoplasm of breast: Secondary | ICD-10-CM | POA: Diagnosis not present

## 2018-06-03 DIAGNOSIS — M1A09X0 Idiopathic chronic gout, multiple sites, without tophus (tophi): Secondary | ICD-10-CM | POA: Diagnosis not present

## 2018-06-10 DIAGNOSIS — C541 Malignant neoplasm of endometrium: Secondary | ICD-10-CM | POA: Diagnosis not present

## 2018-06-10 DIAGNOSIS — R87619 Unspecified abnormal cytological findings in specimens from cervix uteri: Secondary | ICD-10-CM | POA: Diagnosis not present

## 2018-06-17 ENCOUNTER — Telehealth: Payer: Self-pay | Admitting: Oncology

## 2018-06-17 ENCOUNTER — Inpatient Hospital Stay: Payer: Medicare Other | Attending: Gynecology | Admitting: Gynecology

## 2018-06-17 ENCOUNTER — Encounter: Payer: Self-pay | Admitting: Gynecology

## 2018-06-17 ENCOUNTER — Encounter: Payer: Self-pay | Admitting: Oncology

## 2018-06-17 VITALS — BP 136/56 | HR 98 | Temp 98.2°F | Resp 20 | Ht <= 58 in | Wt 204.9 lb

## 2018-06-17 DIAGNOSIS — C541 Malignant neoplasm of endometrium: Secondary | ICD-10-CM | POA: Diagnosis not present

## 2018-06-17 NOTE — Telephone Encounter (Signed)
Left a message for patient regarding appointment with cardiology on 06/26/18 at 3:00 in the [facility] office.

## 2018-06-17 NOTE — Progress Notes (Signed)
Consult Note: Gyn-Onc   Madison Holden 72 y.o. female  Chief Complaint  Patient presents with  . 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  Several medical comorbidities including type 2 diabetes, hypertension, obesity, and recent bout of gout.  Plan: I explained to the patient and her husband the surgical findings as well as pathology.  They understand that the standard of care is to perform a hysterectomy and bilateral salpingo-oophorectomy and possible sentinel node biopsy.  Despite the patient's medical comorbidities I think she is a surgical candidate.  I would recommend this be performed using robot-assisted laparoscopic surgery.  We will arrange this to be performed by 1 of my colleagues who does robotic surgery.  Risks of surgery were reviewed and all questions were answered.   HPI: 72 year old white married female seen in consultation at the request of Dr. Philis Pique regarding management of a newly diagnosed endometrial cancer.  Patient had a routine Pap smear that showed atypical glandular cells.  Cervix was stenotic requiring the patient undergo a D&C which revealed a polypoid lesion in the endometrial cavity.  Final pathology showed this to be a well-differentiated endometrial carcinoma.  Uterus is normal size (sounded 7 cm).  Patient denies any postmenopausal bleeding or discharge.  She has no pelvic pain pressure and has no GI or GU symptoms.  She denies any past gynecologic history.  She has a number of medical problems listed above.  Most recently she has had a bout of gout causing fair amount of pain in her arms hands and legs.  Review of Systems:10 point review of systems is negative except as noted in interval history.   Vitals: Blood pressure (!) 136/56, pulse 98, temperature 98.2 F (36.8 C), temperature source Oral, resp. rate 20, height 4\' 10"  (1.473 m), weight 204 lb 14.4 oz (92.9 kg), SpO2 98 %.  Physical  Exam: General : The patient is a healthy woman in no acute distress.  HEENT: normocephalic, extraoccular movements normal; neck is supple without thyromegally  Lynphnodes: Supraclavicular and inguinal nodes not enlarged  Abdomen: Soft, non-tender, no ascites, no organomegally, no masses, no hernias  Pelvic:  EGBUS: Normal female  Vagina: Normal, no lesions  Urethra and Bladder: Normal, non-tender  Cervix: Normal no lesions are noted Uterus: Difficult to delineate due to the patient's habitus. Bi-manual examination: Non-tender; no adenxal masses or nodularity    Lower extremities: No edema or varicosities. Normal range of motion      No Known Allergies  Past Medical History:  Diagnosis Date  . Adenomatous colon polyp    tubular  . Arthritis    rhuemtoid  . Cardiomegaly   . Diabetes mellitus ([place])    Type 2  . Esophageal reflux   . Esophageal stricture   . Gout   . Hyperlipidemia   . Obesity   . Unspecified essential hypertension   . Urine frequency     Past Surgical History:  Procedure Laterality Date  . EYE SURGERY  2017, 2014   Catracts surgery on both eyes  . MOUTH SURGERY  11/2017   Upper quadrants  . removal of cyst in breast     right  . TONSILLECTOMY      Current Outpatient Medications  Medication Sig Dispense Refill  . allopurinol (ZYLOPRIM) 100 MG tablet Take 100 mg by mouth 2 (two) times daily. For Gout    . ASA-APAP-Caff Buffered (VANQUISH) [phone] MG TABS Take by mouth as needed.     Marland Kitchen  colchicine 0.6 MG tablet Take 0.6 mg by mouth 2 (two) times daily. For Gout    . diphenhydrAMINE (BENADRYL) 25 MG tablet Take 25 mg by mouth at bedtime as needed. As needed for sleep    . esomeprazole (NEXIUM) 40 MG capsule Take 1 capsule (40 mg total) by mouth daily. (Patient taking differently: Take 40 mg by mouth. OTC take three times per week) 90 capsule 3  . hydrochlorothiazide (HYDRODIURIL) 25 MG tablet TAKE ONE-HALF TABLET BY MOUTH ONCE DAILY 15 tablet 0  .  HYDROcodone-acetaminophen (NORCO/VICODIN) 5-325 MG tablet Take 1 tablet by mouth every 6 (six) hours as needed for moderate pain.    . Ibuprofen (ADVIL) 200 MG CAPS Take 200 mg by mouth as needed.    Marland Kitchen lisinopril (PRINIVIL,ZESTRIL) 40 MG tablet Take 40 mg by mouth daily.    . Melatonin (MELATONIN CR) 3 MG TBCR Take by mouth as needed. 1-2 times per week as needed.    . meloxicam (MOBIC) 15 MG tablet Take 15 mg by mouth daily.    . metFORMIN (GLUCOPHAGE) 1000 MG tablet Take 1,000 mg by mouth 2 (two) times daily with a meal.    . oxybutynin (DITROPAN) 5 MG tablet Take 5 mg by mouth daily.    . pioglitazone (ACTOS) 15 MG tablet Take 7.5 mg by mouth daily.    . verapamil (COVERA HS) 240 MG (CO) 24 hr tablet Take 240 mg by mouth at bedtime.     No current facility-administered medications for this visit.     Social History   Socioeconomic History  . Marital status: Married    Spouse name: Not on file  . Number of children: 0  . Years of education: Not on file  . Highest education level: Not on file  Occupational History  . Occupation: retired  Scientific laboratory technician  . Financial resource strain: Not on file  . Food insecurity:    Worry: Not on file    Inability: Not on file  . Transportation needs:    Medical: Not on file    Non-medical: Not on file  Tobacco Use  . Smoking status: Never Smoker  . Smokeless tobacco: Never Used  Substance and Sexual Activity  . Alcohol use: Yes    Comment: 2-3 per  week, not currently drinking since Apri; 2019  . Drug use: No  . Sexual activity: Not on file  Lifestyle  . Physical activity:    Days per week: Not on file    Minutes per session: Not on file  . Stress: Not on file  Relationships  . Social connections:    Talks on phone: Not on file    Gets together: Not on file    Attends religious service: Not on file    Active member of club or organization: Not on file    Attends meetings of clubs or organizations: Not on file    Relationship status:  Not on file  . Intimate partner violence:    Fear of current or ex partner: Not on file    Emotionally abused: Not on file    Physically abused: Not on file    Forced sexual activity: Not on file  Other Topics Concern  . Not on file  Social History Narrative   Has never smoked. Daily Caffeine Use- 2 cups per day. Does not get regular exercise. Retired.     Family History  Problem Relation Age of Onset  . Heart disease Mother   . Heart failure  Mother   . Heart disease Father   . Prostate cancer Father   . Clotting disorder Father   . Diabetes Maternal Grandfather   . Colon cancer Neg Hx       Marti Sleigh, MD 06/17/2018, 2:05 PM

## 2018-06-17 NOTE — Patient Instructions (Signed)
Preparing for your Surgery  Plan for surgery on July 08, 2018 with Dr. Everitt Amber at [place] will be scheduled for a robotic assisted total laparoscopic hysterectomy, bilateral salpingo-oophorectomy, sentinel lymph node biopsy.    Pre-operative Testing -You will receive a phone call from presurgical testing at [facility] to arrange for a pre-operative testing appointment before your surgery.  This appointment normally occurs one to two weeks before your scheduled surgery.   -Bring your insurance card, copy of an advanced directive if applicable, medication list  -At that visit, you will be asked to sign a consent for a possible blood transfusion in case a transfusion becomes necessary during surgery.  The need for a blood transfusion is rare but having consent is a necessary part of your care.     -You should not be taking blood thinners or aspirin at least ten days prior to surgery unless instructed by your surgeon.  Day Before Surgery at [place] will be asked to take in a light diet the day before surgery.  Avoid carbonated beverages.  You will be advised to have nothing to eat or drink after midnight the evening before.    Eat a light diet the day before surgery.  Examples including soups, broths, toast, yogurt, mashed potatoes.  Things to avoid include carbonated beverages (fizzy beverages), raw fruits and raw vegetables, or beans.   If your bowels are filled with gas, your surgeon will have difficulty visualizing your pelvic organs which increases your surgical risks.  Your role in recovery Your role is to become active as soon as directed by your doctor, while still giving yourself time to heal.  Rest when you feel tired. You will be asked to do the following in order to speed your recovery:  - Cough and breathe deeply. This helps toclear and expand your lungs and can prevent pneumonia. You may be given a spirometer to practice deep  breathing. A staff member will show you how to use the spirometer. - Do mild physical activity. Walking or moving your legs help your circulation and body functions return to normal. A staff member will help you when you try to walk and will provide you with simple exercises. Do not try to get up or walk alone the first time. - Actively manage your pain. Managing your pain lets you move in comfort. We will ask you to rate your pain on a scale of zero to 10. It is your responsibility to tell your doctor or nurse where and how much you hurt so your pain can be treated.  Special Considerations -If you are diabetic, you may be placed on insulin after surgery to have closer control over your blood sugars to promote healing and recovery.  This does not mean that you will be discharged on insulin.  If applicable, your oral antidiabetics will be resumed when you are tolerating a solid diet.  -Your final pathology results from surgery should be available by the Friday after surgery and the results will be relayed to you when available.  -Dr. [place] Crocker or Dr. Precious Haws is the Surgeon that assists your GYN Oncologist with surgery.  The next day after your surgery you will either see your GYN Oncologist or Dr. [place] Crocker.   Blood Transfusion Information WHAT IS A BLOOD TRANSFUSION? A transfusion is the replacement of blood or some of its parts. Blood is made up of multiple cells which provide different functions.  Red blood cells  carry oxygen and are used for blood loss replacement.  White blood cells fight against infection.  Platelets control bleeding.  Plasma helps clot blood.  Other blood products are available for specialized needs, such as hemophilia or other clotting disorders. BEFORE THE TRANSFUSION  Who gives blood for transfusions?   You may be able to donate blood to be used at a later date on yourself (autologous donation).  Relatives can be asked to donate blood.  This is generally not any safer than if you have received blood from a stranger. The same precautions are taken to ensure safety when a relative's blood is donated.  Healthy volunteers who are fully evaluated to make sure their blood is safe. This is blood bank blood. Transfusion therapy is the safest it has ever been in the practice of medicine. Before blood is taken from a donor, a complete history is taken to make sure that person has no history of diseases nor engages in risky social behavior (examples are intravenous drug use or sexual activity with multiple partners). The donor's travel history is screened to minimize risk of transmitting infections, such as malaria. The donated blood is tested for signs of infectious diseases, such as HIV and hepatitis. The blood is then tested to be sure it is compatible with you in order to minimize the chance of a transfusion reaction. If you or a relative donates blood, this is often done in anticipation of surgery and is not appropriate for emergency situations. It takes many days to process the donated blood. RISKS AND COMPLICATIONS Although transfusion therapy is very safe and saves many lives, the main dangers of transfusion include:   Getting an infectious disease.  Developing a transfusion reaction. This is an allergic reaction to something in the blood you were given. Every precaution is taken to prevent this. The decision to have a blood transfusion has been considered carefully by your caregiver before blood is given. Blood is not given unless the benefits outweigh the risks.

## 2018-06-18 ENCOUNTER — Telehealth: Payer: Self-pay | Admitting: Oncology

## 2018-06-18 DIAGNOSIS — E119 Type 2 diabetes mellitus without complications: Secondary | ICD-10-CM | POA: Insufficient documentation

## 2018-06-18 NOTE — Telephone Encounter (Signed)
Josiah called and was advised of appointment with Dr. Geraldo Pitter on 06/26/18 at 3 pm at [facility] for cardiac clearance.  She verbalized understanding and agreement.

## 2018-06-26 ENCOUNTER — Encounter: Payer: Self-pay | Admitting: Cardiology

## 2018-06-26 ENCOUNTER — Telehealth: Payer: Self-pay

## 2018-06-26 ENCOUNTER — Telehealth: Payer: Self-pay | Admitting: Oncology

## 2018-06-26 ENCOUNTER — Ambulatory Visit: Payer: Medicare Other | Admitting: Cardiology

## 2018-06-26 VITALS — BP 118/68 | HR 98 | Ht <= 58 in | Wt 199.0 lb

## 2018-06-26 DIAGNOSIS — Z01810 Encounter for preprocedural cardiovascular examination: Secondary | ICD-10-CM | POA: Diagnosis not present

## 2018-06-26 DIAGNOSIS — I10 Essential (primary) hypertension: Secondary | ICD-10-CM

## 2018-06-26 DIAGNOSIS — R079 Chest pain, unspecified: Secondary | ICD-10-CM | POA: Insufficient documentation

## 2018-06-26 DIAGNOSIS — E782 Mixed hyperlipidemia: Secondary | ICD-10-CM | POA: Diagnosis not present

## 2018-06-26 NOTE — Patient Instructions (Addendum)
Medication Instructions:  Your physician recommends that you continue on your current medications as directed. Please refer to the Current Medication list given to you today.  Labwork: None  Testing/Procedures: Your physician has requested that you have an echocardiogram. Echocardiography is a painless test that uses sound waves to create images of your heart. It provides your doctor with information about the size and shape of your heart and how well your heart's chambers and valves are working. This procedure takes approximately one hour. There are no restrictions for this procedure.  Your physician has requested that you have a lexiscan myoview. For further information please visit [email or web address]. Please follow instruction sheet, as given.  Follow-Up: Your physician recommends that you schedule a follow-up appointment in: as needed  Any Other Special Instructions Will Be Listed Below (If Applicable).     If you need a refill on your cardiac medications before your next appointment, please call your pharmacy.   [place], RN, BSN   Echocardiogram An echocardiogram, or echocardiography, uses sound waves (ultrasound) to produce an image of your heart. The echocardiogram is simple, painless, obtained within a short period of time, and offers valuable information to your health care provider. The images from an echocardiogram can provide information such as:  Evidence of coronary artery disease (CAD).  Heart size.  Heart muscle function.  Heart valve function.  Aneurysm detection.  Evidence of a past heart attack.  Fluid buildup around the heart.  Heart muscle thickening.  Assess heart valve function.  Tell a health care provider about:  Any allergies you have.  All medicines you are taking, including vitamins, herbs, eye drops, creams, and over-the-counter medicines.  Any problems you or family members have had with anesthetic  medicines.  Any blood disorders you have.  Any surgeries you have had.  Any medical conditions you have.  Whether you are pregnant or may be pregnant. What happens before the procedure? No special preparation is needed. Eat and drink normally. What happens during the procedure?  In order to produce an image of your heart, gel will be applied to your chest and a wand-like tool (transducer) will be moved over your chest. The gel will help transmit the sound waves from the transducer. The sound waves will harmlessly bounce off your heart to allow the heart images to be captured in real-time motion. These images will then be recorded.  You may need an IV to receive a medicine that improves the quality of the pictures. What happens after the procedure? You may return to your normal schedule including diet, activities, and medicines, unless your health care provider tells you otherwise. This information is not intended to replace advice given to you by your health care provider. Make sure you discuss any questions you have with your health care provider. Document Released: 10/05/2000 Document Revised: 05/26/2016 Document Reviewed: 06/15/2013 Elsevier Interactive Patient Education  2017 [employer].

## 2018-06-26 NOTE — Telephone Encounter (Signed)
Outgoing call to patient by Elmo Putt RN nurse navigator- patient not available - spoke with husband.  Santiago Glad let him know that Madison John NP would be in contact and is trying to reschedule her stress test earlier so she can keep her upcoming surgery appt as scheduled for 9-17.  He voiced understanding and will let patient know.  No other needs per him at this time.

## 2018-06-26 NOTE — Telephone Encounter (Signed)
Incoming call from patient regarding she reported she has upcoming surgery on 9-17 but cardiologist wants her to have stress test on 9-18.  Pt doesn't want to reschedule her surgery because reports she was told that the next date was in October.  Pt reports she has spoke to Debara Pickett in past regarding cardiologist appt and pt would like to see if we find her appt sooner for the stress test so she can have surgery as scheduled on 9-17.  Pt said she doesn't mind seeing a different cardiologist.  Notified Elmo Putt -nurse navigator and let patient know that Santiago Glad is going to work on this for her and call her back. Pt voiced understanding and no other needs per pt at this time.

## 2018-06-26 NOTE — Telephone Encounter (Signed)
Called [facility] regarding patient's call about a stress test scheduled for 9/18.  Advised the scheduler that patient needs the stress test done before 9/17 for cardiac clearance for surgery.  She will call the patient to see if it can be scheduled sooner.

## 2018-06-26 NOTE — Progress Notes (Signed)
Cardiology Office Note:    Date:  06/26/2018   ID:  Madison Holden, DOB January 11, 1946, MRN [id number]  PCP:  Orpah Melter, MD  Cardiologist:  Jenean Lindau, MD   Referring MD: Orpah Melter, MD    ASSESSMENT:    1. Pre-operative cardiovascular examination   2. Essential hypertension   3. Mixed dyslipidemia   4. Morbid obesity ([place])   5. Chest pain, unspecified type    PLAN:    In order of problems listed above:  1. Primary prevention stressed with the patient.  Importance of compliance with diet and medication stressed and she vocalized understanding. 2. Her blood pressure is stable.  Diet was discussed for obesity and risks explained and she vocalized understanding. 3. In view of multiple risk factors for coronary artery disease and for preop risk stratification he will undergo Lexiscan sestamibi.  Echocardiogram will be done to assess murmur heard on auscultation.  If these are unremarkable then she is not at high risk for coronary events during the aforementioned surgery.  Meticulous hemodynamic monitoring will further reduce the risk of coronary events. 4. Patient will be seen in follow-up on a as needed basis.   Medication Adjustments/Labs and Tests Ordered: Current medicines are reviewed at length with the patient today.  Concerns regarding medicines are outlined above.  No orders of the defined types were placed in this encounter.  No orders of the defined types were placed in this encounter.    History of Present Illness:    Madison Holden is a 72 y.o. female who is being seen today for the evaluation of preop risk stratification for uterine surgery chest pain at the request of Orpah Melter, MD.  Patient is a pleasant 72 year old female.  She has past medical history of essential hypertension and morbid obesity.  She leads a sedentary lifestyle.  She is planning to undergo hysterectomy and is referred here for the same reason.  She gives a episode of  chest tightness with no exertion.  No radiation of the symptoms to any part of the body.  She is morbidly obese and ambulates only with a cane.  Her husband is very supportive and accompanies him for this visit.  At the time of my evaluation, the patient is alert awake oriented and in no distress.  Past Medical History:  Diagnosis Date  . Adenomatous colon polyp    tubular  . Arthritis    rhuemtoid  . Cardiomegaly   . Diabetes mellitus ([place])    Type 2  . Esophageal reflux   . Esophageal stricture   . Gout   . Hyperlipidemia   . Obesity   . Unspecified essential hypertension   . Urine frequency     Past Surgical History:  Procedure Laterality Date  . EYE SURGERY  2017, 2014   Catracts surgery on both eyes  . MOUTH SURGERY  11/2017   Upper quadrants  . removal of cyst in breast     right  . TONSILLECTOMY      Current Medications: Current Meds  Medication Sig  . allopurinol (ZYLOPRIM) 100 MG tablet Take 100 mg by mouth 2 (two) times daily. For Gout  . ASA-APAP-Caff Buffered (VANQUISH) [phone] MG TABS Take by mouth as needed.   . colchicine 0.6 MG tablet Take 0.6 mg by mouth 2 (two) times daily. For Gout  . diphenhydrAMINE (BENADRYL) 25 MG tablet Take 25 mg by mouth at bedtime as needed. As needed for sleep  . esomeprazole ([place])  40 MG capsule Take 1 capsule (40 mg total) by mouth daily. (Patient taking differently: Take 40 mg by mouth. OTC take three times per week)  . hydrochlorothiazide (HYDRODIURIL) 25 MG tablet TAKE ONE-HALF TABLET BY MOUTH ONCE DAILY  . HYDROcodone-acetaminophen (NORCO/VICODIN) 5-325 MG tablet Take 1 tablet by mouth every 6 (six) hours as needed for moderate pain.  . Ibuprofen (ADVIL) 200 MG CAPS Take 200 mg by mouth as needed.  Marland Kitchen ketorolac (ACULAR) 0.5 % ophthalmic solution Place 1 drop into both eyes as needed.   Marland Kitchen lisinopril (PRINIVIL,ZESTRIL) 40 MG tablet Take 40 mg by mouth daily.  . Melatonin 3 MG TABS melatonin 3 mg tablet  Take by oral  route.  . meloxicam (MOBIC) 15 MG tablet Take 15 mg by mouth daily.  . metFORMIN (GLUCOPHAGE) 1000 MG tablet Take 1,000 mg by mouth 2 (two) times daily with a meal.  . oxybutynin (DITROPAN) 5 MG tablet Take 5 mg by mouth daily.  . verapamil (CALAN-SR) 240 MG CR tablet TAKE 1 TABLET BY MOUTH ONCE DAILY IN THE MORNING WITH FOOD  . [DISCONTINUED] Melatonin (MELATONIN CR) 3 MG TBCR Take by mouth as needed. 1-2 times per week as needed.     Allergies:   Other   Social History   Socioeconomic History  . Marital status: Married    Spouse name: Not on file  . Number of children: 0  . Years of education: Not on file  . Highest education level: Not on file  Occupational History  . Occupation: retired  Scientific laboratory technician  . Financial resource strain: Not on file  . Food insecurity:    Worry: Not on file    Inability: Not on file  . Transportation needs:    Medical: Not on file    Non-medical: Not on file  Tobacco Use  . Smoking status: Never Smoker  . Smokeless tobacco: Never Used  Substance and Sexual Activity  . Alcohol use: Yes    Comment: 2-3 per  week, not currently drinking since Apri; 2019  . Drug use: No  . Sexual activity: Not on file  Lifestyle  . Physical activity:    Days per week: Not on file    Minutes per session: Not on file  . Stress: Not on file  Relationships  . Social connections:    Talks on phone: Not on file    Gets together: Not on file    Attends religious service: Not on file    Active member of club or organization: Not on file    Attends meetings of clubs or organizations: Not on file    Relationship status: Not on file  Other Topics Concern  . Not on file  Social History Narrative   Has never smoked. Daily Caffeine Use- 2 cups per day. Does not get regular exercise. Retired.      Family History: The patient's family history includes Clotting disorder in her father; Diabetes in her maternal grandfather; Heart disease in her father and mother; Heart  failure in her mother; Prostate cancer in her father. There is no history of Colon cancer.  ROS:   Please see the history of present illness.    All other systems reviewed and are negative.  EKGs/Labs/Other Studies Reviewed:    The following studies were reviewed today: EKG reveals sinus rhythm and nonspecific ST-T changes   Recent Labs: No results found for requested labs within last 8760 hours.  Recent Lipid Panel    Component Value Date/Time  CHOL 119 06/29/2010 0948   TRIG 205.0 (H) 06/29/2010 0948   HDL 29.90 (L) 06/29/2010 0948   CHOLHDL 4 06/29/2010 0948   VLDL 41.0 (H) 06/29/2010 0948   LDLDIRECT 56.6 06/29/2010 0948    Physical Exam:    VS:  BP 118/68 (BP Location: Right Arm, Patient Position: Sitting, Cuff Size: Normal)   Pulse 98   Ht 4\' 10"  (1.473 m)   Wt 199 lb (90.3 kg)   SpO2 96%   BMI 41.59 kg/m     Wt Readings from Last 3 Encounters:  06/26/18 199 lb (90.3 kg)  06/17/18 204 lb 14.4 oz (92.9 kg)  12/29/14 263 lb (119.3 kg)     GEN: Patient is in no acute distress HEENT: Normal NECK: No JVD; No carotid bruits LYMPHATICS: No lymphadenopathy CARDIAC: S1 S2 regular, 2/6 systolic murmur at the apex. RESPIRATORY:  Clear to auscultation without rales, wheezing or rhonchi  ABDOMEN: Soft, non-tender, non-distended MUSCULOSKELETAL:  No edema; No deformity  SKIN: Warm and dry NEUROLOGIC:  Alert and oriented x 3 PSYCHIATRIC:  Normal affect    Signed, Jenean Lindau, MD  06/26/2018 3:17 PM    [facility]

## 2018-06-27 ENCOUNTER — Telehealth: Payer: Self-pay

## 2018-06-27 NOTE — Telephone Encounter (Signed)
Received call from patient and she reported that the cardiologist was able to get her stress test appts for 9-12 and 9-13 so to keep her surgery date as planned.  I told her I would let providers know and would wait for her stress test results but hopefully be normal and able to proceed with surgery date.  Pt voiced understanding.  No other needs per pt. Notified Melissa NP and will route note to Elmo Putt RN nurse navigator.

## 2018-06-30 ENCOUNTER — Encounter (HOSPITAL_COMMUNITY): Payer: Self-pay | Admitting: *Deleted

## 2018-06-30 ENCOUNTER — Encounter (HOSPITAL_COMMUNITY): Payer: Self-pay

## 2018-06-30 NOTE — Patient Instructions (Addendum)
Madison Holden [facility]  DOB  14-Jan-1946    Your procedure is scheduled on:  07-08-2018    Report to [facility] Main  Entrance,  Report to admitting at  5:30 AM    Call this number if you have problems the morning of surgery [phone]     Remember: Do not eat food or drink liquids :After Midnight.             Eat a light diet the day before surgery.  Examples including soups, broths, toast, yogurt, mashed potatoes.  Things to avoid include carbonated beverages (fizzy beverages), raw fruits and raw vegetables, or beans.             If your bowels are filled with gas, your surgeon will have difficulty visualizing your pelvic organs which increases your surgical risks.             Take these medicines the morning of surgery with A SIP OF WATER:  ALLOPURINOL, COLCHICINE, NEXIUM, HYDROCODONE AND OXYBUTYNIN AS NEEDED             DO NOT TAKE ANY DIABETIC MEDICATIONS DAY OF YOUR SURGERY                                            You may not have any metal on your body including hair pins and              piercings               Do not wear jewelry, make-up, lotions, powders or perfumes, deodorant                        Do not wear nail polish.  Do not shave  48 hours prior to surgery.                           Do not bring valuables to the hospital. [place].  Contacts, dentures or bridgework may not be worn into surgery.  Leave suitcase in the car. After surgery it may be brought to your room.     Special Instructions: N/A              Please read over the following fact sheets you were given:    ________________________________________________Incentive Spirometer  An incentive spirometer is a tool that can help keep your lungs clear and active. This tool measures how well you are filling your lungs with each breath. Taking long deep breaths may help reverse or decrease the chance of developing breathing  (pulmonary) problems (especially infection) following:  A long period of time when you are unable to move or be active. BEFORE THE PROCEDURE   If the spirometer includes an indicator to show your best effort, your nurse or respiratory therapist will set it to a desired goal.  If possible, sit up straight or lean slightly forward. Try not to slouch.  Hold the incentive spirometer in an upright position. INSTRUCTIONS FOR USE  1. Sit on the edge of your bed if possible, or sit up as far as you can in bed or on  a chair. 2. Hold the incentive spirometer in an upright position. 3. Breathe out normally. 4. Place the mouthpiece in your mouth and seal your lips tightly around it. 5. Breathe in slowly and as deeply as possible, raising the piston or the ball toward the top of the column. 6. Hold your breath for 3-5 seconds or for as long as possible. Allow the piston or ball to fall to the bottom of the column. 7. Remove the mouthpiece from your mouth and breathe out normally. 8. Rest for a few seconds and repeat Steps 1 through 7 at least 10 times every 1-2 hours when you are awake. Take your time and take a few normal breaths between deep breaths. 9. The spirometer may include an indicator to show your best effort. Use the indicator as a goal to work toward during each repetition. 10. After each set of 10 deep breaths, practice coughing to be sure your lungs are clear. If you have an incision (the cut made at the time of surgery), support your incision when coughing by placing a pillow or rolled up towels firmly against it. Once you are able to get out of bed, walk around indoors and cough well. You may stop using the incentive spirometer when instructed by your caregiver.  RISKS AND COMPLICATIONS  Take your time so you do not get dizzy or light-headed.  If you are in pain, you may need to take or ask for pain medication before doing incentive spirometry. It is harder to take a deep breath if you  are having pain. AFTER USE  Rest and breathe slowly and easily.  It can be helpful to keep track of a log of your progress. Your caregiver can provide you with a simple table to help with this. If you are using the spirometer at home, follow these instructions: [place] IF:   You are having difficultly using the spirometer.  You have trouble using the spirometer as often as instructed.  Your pain medication is not giving enough relief while using the spirometer.  You develop fever of 100.5 F (38.1 C) or higher. SEEK IMMEDIATE MEDICAL CARE IF:   You cough up bloody sputum that had not been present before.  You develop fever of 102 F (38.9 C) or greater.  You develop worsening pain at or near the incision site. MAKE SURE YOU:   Understand these instructions.  Will watch your condition.  Will get help right away if you are not doing well or get worse. Document Released: 02/18/2007 Document Revised: 12/31/2011 Document Reviewed: 04/21/2007 ExitCare Patient Information 2014 ExitCare, [place].     _______________________________WHAT IS A BLOOD TRANSFUSION? Blood Transfusion Information  A transfusion is the replacement of blood or some of its parts. Blood is made up of multiple cells which provide different functions.  Red blood cells carry oxygen and are used for blood loss replacement.  White blood cells fight against infection.  Platelets control bleeding.  Plasma helps clot blood.  Other blood products are available for specialized needs, such as hemophilia or other clotting disorders. BEFORE THE TRANSFUSION  Who gives blood for transfusions?   Healthy volunteers who are fully evaluated to make sure their blood is safe. This is blood bank blood. Transfusion therapy is the safest it has ever been in the practice of medicine. Before blood is taken from a donor, a complete history is taken to make sure that person has no history of diseases nor engages in  risky social behavior (  examples are intravenous drug use or sexual activity with multiple partners). The donor's travel history is screened to minimize risk of transmitting infections, such as malaria. The donated blood is tested for signs of infectious diseases, such as HIV and hepatitis. The blood is then tested to be sure it is compatible with you in order to minimize the chance of a transfusion reaction. If you or a relative donates blood, this is often done in anticipation of surgery and is not appropriate for emergency situations. It takes many days to process the donated blood. RISKS AND COMPLICATIONS Although transfusion therapy is very safe and saves many lives, the main dangers of transfusion include:   Getting an infectious disease.  Developing a transfusion reaction. This is an allergic reaction to something in the blood you were given. Every precaution is taken to prevent this. The decision to have a blood transfusion has been considered carefully by your caregiver before blood is given. Blood is not given unless the benefits outweigh the risks. AFTER THE TRANSFUSION  Right after receiving a blood transfusion, you will usually feel much better and more energetic. This is especially true if your red blood cells have gotten low (anemic). The transfusion raises the level of the red blood cells which carry oxygen, and this usually causes an energy increase.  The nurse administering the transfusion will monitor you carefully for complications. HOME CARE INSTRUCTIONS  No special instructions are needed after a transfusion. You may find your energy is better. Speak with your caregiver about any limitations on activity for underlying diseases you may have. SEEK MEDICAL CARE IF:   Your condition is not improving after your transfusion.  You develop redness or irritation at the intravenous (IV) site. SEEK IMMEDIATE MEDICAL CARE IF:  Any of the following symptoms occur over the next 12  hours:  Shaking chills.  You have a temperature by mouth above 102 F (38.9 C), not controlled by medicine.  Chest, back, or muscle pain.  People around you feel you are not acting correctly or are confused.  Shortness of breath or difficulty breathing.  Dizziness and fainting.  You get a rash or develop hives.  You have a decrease in urine output.  Your urine turns a dark color or changes to pink, red, or brown. Any of the following symptoms occur over the next 10 days:  You have a temperature by mouth above 102 F (38.9 C), not controlled by medicine.  Shortness of breath.  Weakness after normal activity.  The white part of the eye turns yellow (jaundice).  You have a decrease in the amount of urine or are urinating less often.  Your urine turns a dark color or changes to pink, red, or brown. Document Released: 10/05/2000 Document Revised: 12/31/2011 Document Reviewed: 05/24/2008 ExitCare Patient Information 2014 [place].  ______________________________[facility] - Preparing for Surgery Before surgery, you can play an important role.  Because skin is not sterile, your skin needs to be as free of germs as possible.  You can reduce the number of germs on your skin by washing with CHG (chlorahexidine gluconate) soap before surgery.  CHG is an antiseptic cleaner which kills germs and bonds with the skin to continue killing germs even after washing. Please DO NOT use if you have an allergy to CHG or antibacterial soaps.  If your skin becomes reddened/irritated stop using the CHG and inform your nurse when you arrive at Short Stay. Do not shave (including legs and underarms) for at least  48 hours prior to the first CHG shower.  You may shave your face/neck. Please follow these instructions carefully:  1.  Shower with CHG Soap the night before surgery and the  morning of Surgery.  2.  If you choose to wash your hair, wash your hair first as usual with your  normal   shampoo.  3.  After you shampoo, rinse your hair and body thoroughly to remove the  shampoo.                                        4.  Use CHG as you would any other liquid soap.  You can apply chg directly  to the skin and wash                       Gently with a scrungie or clean washcloth.  5.  Apply the CHG Soap to your body ONLY FROM THE NECK DOWN.   Do not use on face/ open                           Wound or open sores. Avoid contact with eyes, ears mouth and genitals (private parts).                       Wash face,  Genitals (private parts) with your normal soap.             6.  Wash thoroughly, paying special attention to the area where your surgery  will be performed.  7.  Thoroughly rinse your body with warm water from the neck down.  8.  DO NOT shower/wash with your normal soap after using and rinsing off  the CHG Soap.             9.  Pat yourself dry with a clean towel.            10.  Wear clean pajamas.            11.  Place clean sheets on your bed the night of your first shower and do not  sleep with pets. Day of Surgery : Do not apply any lotions/deodorants the morning of surgery.  Please wear clean clothes to the hospital/surgery center.  FAILURE TO FOLLOW THESE INSTRUCTIONS MAY RESULT IN THE CANCELLATION OF YOUR SURGERY PATIENT SIGNATURE_________________________________  NURSE SIGNATURE__________________________________  ________________________________________________________________________

## 2018-07-01 ENCOUNTER — Other Ambulatory Visit (HOSPITAL_COMMUNITY): Payer: Self-pay

## 2018-07-01 ENCOUNTER — Encounter (HOSPITAL_COMMUNITY): Payer: Self-pay

## 2018-07-01 ENCOUNTER — Inpatient Hospital Stay (HOSPITAL_COMMUNITY)
Admission: EM | Admit: 2018-07-01 | Discharge: 2018-07-04 | DRG: 683 | Disposition: A | Discharge status: Home / Self Care | Payer: Medicare Other | Attending: Internal Medicine | Admitting: Internal Medicine

## 2018-07-01 ENCOUNTER — Emergency Department (HOSPITAL_COMMUNITY): Payer: Medicare Other

## 2018-07-01 ENCOUNTER — Other Ambulatory Visit: Payer: Self-pay

## 2018-07-01 ENCOUNTER — Telehealth (HOSPITAL_COMMUNITY): Payer: Self-pay

## 2018-07-01 ENCOUNTER — Telehealth: Payer: Self-pay | Admitting: *Deleted

## 2018-07-01 ENCOUNTER — Encounter (HOSPITAL_COMMUNITY)
Admission: RE | Admit: 2018-07-01 | Discharge: 2018-07-01 | Disposition: A | Discharge status: Home / Self Care | Payer: Medicare Other | Source: Ambulatory Visit | Attending: Gynecologic Oncology | Admitting: Gynecologic Oncology

## 2018-07-01 DIAGNOSIS — M10041 Idiopathic gout, right hand: Secondary | ICD-10-CM | POA: Diagnosis present

## 2018-07-01 DIAGNOSIS — Z8042 Family history of malignant neoplasm of prostate: Secondary | ICD-10-CM | POA: Diagnosis not present

## 2018-07-01 DIAGNOSIS — R5382 Chronic fatigue, unspecified: Secondary | ICD-10-CM | POA: Diagnosis not present

## 2018-07-01 DIAGNOSIS — Z791 Long term (current) use of non-steroidal anti-inflammatories (NSAID): Secondary | ICD-10-CM

## 2018-07-01 DIAGNOSIS — E860 Dehydration: Secondary | ICD-10-CM | POA: Diagnosis present

## 2018-07-01 DIAGNOSIS — I959 Hypotension, unspecified: Secondary | ICD-10-CM | POA: Diagnosis present

## 2018-07-01 DIAGNOSIS — N179 Acute kidney failure, unspecified: Secondary | ICD-10-CM | POA: Diagnosis present

## 2018-07-01 DIAGNOSIS — E875 Hyperkalemia: Secondary | ICD-10-CM | POA: Diagnosis present

## 2018-07-01 DIAGNOSIS — M109 Gout, unspecified: Secondary | ICD-10-CM | POA: Diagnosis present

## 2018-07-01 DIAGNOSIS — C541 Malignant neoplasm of endometrium: Secondary | ICD-10-CM | POA: Diagnosis present

## 2018-07-01 DIAGNOSIS — M1A09X0 Idiopathic chronic gout, multiple sites, without tophus (tophi): Secondary | ICD-10-CM | POA: Diagnosis not present

## 2018-07-01 DIAGNOSIS — T464X5A Adverse effect of angiotensin-converting-enzyme inhibitors, initial encounter: Secondary | ICD-10-CM | POA: Diagnosis not present

## 2018-07-01 DIAGNOSIS — K219 Gastro-esophageal reflux disease without esophagitis: Secondary | ICD-10-CM | POA: Diagnosis present

## 2018-07-01 DIAGNOSIS — Z7984 Long term (current) use of oral hypoglycemic drugs: Secondary | ICD-10-CM | POA: Diagnosis not present

## 2018-07-01 DIAGNOSIS — E119 Type 2 diabetes mellitus without complications: Secondary | ICD-10-CM | POA: Diagnosis not present

## 2018-07-01 DIAGNOSIS — T502X5A Adverse effect of carbonic-anhydrase inhibitors, benzothiadiazides and other diuretics, initial encounter: Secondary | ICD-10-CM | POA: Diagnosis not present

## 2018-07-01 DIAGNOSIS — D649 Anemia, unspecified: Secondary | ICD-10-CM | POA: Diagnosis present

## 2018-07-01 DIAGNOSIS — Z01812 Encounter for preprocedural laboratory examination: Secondary | ICD-10-CM

## 2018-07-01 DIAGNOSIS — R5383 Other fatigue: Secondary | ICD-10-CM | POA: Diagnosis not present

## 2018-07-01 DIAGNOSIS — I2510 Atherosclerotic heart disease of native coronary artery without angina pectoris: Secondary | ICD-10-CM | POA: Diagnosis present

## 2018-07-01 DIAGNOSIS — M10042 Idiopathic gout, left hand: Secondary | ICD-10-CM | POA: Diagnosis not present

## 2018-07-01 DIAGNOSIS — Z01818 Encounter for other preprocedural examination: Secondary | ICD-10-CM | POA: Diagnosis not present

## 2018-07-01 DIAGNOSIS — R531 Weakness: Secondary | ICD-10-CM | POA: Diagnosis not present

## 2018-07-01 DIAGNOSIS — M2550 Pain in unspecified joint: Secondary | ICD-10-CM | POA: Diagnosis not present

## 2018-07-01 DIAGNOSIS — I10 Essential (primary) hypertension: Secondary | ICD-10-CM | POA: Diagnosis present

## 2018-07-01 DIAGNOSIS — N170 Acute kidney failure with tubular necrosis: Principal | ICD-10-CM | POA: Diagnosis present

## 2018-07-01 DIAGNOSIS — R799 Abnormal finding of blood chemistry, unspecified: Secondary | ICD-10-CM | POA: Diagnosis present

## 2018-07-01 DIAGNOSIS — E782 Mixed hyperlipidemia: Secondary | ICD-10-CM | POA: Diagnosis present

## 2018-07-01 DIAGNOSIS — Z6841 Body Mass Index (BMI) 40.0 and over, adult: Secondary | ICD-10-CM

## 2018-07-01 DIAGNOSIS — G8929 Other chronic pain: Secondary | ICD-10-CM | POA: Diagnosis present

## 2018-07-01 DIAGNOSIS — T39395A Adverse effect of other nonsteroidal anti-inflammatory drugs [NSAID], initial encounter: Secondary | ICD-10-CM | POA: Diagnosis present

## 2018-07-01 DIAGNOSIS — Z7982 Long term (current) use of aspirin: Secondary | ICD-10-CM

## 2018-07-01 DIAGNOSIS — M791 Myalgia, unspecified site: Secondary | ICD-10-CM | POA: Diagnosis not present

## 2018-07-01 HISTORY — DX: Frequency of micturition: R35.0

## 2018-07-01 HISTORY — DX: Muscle weakness (generalized): M62.81

## 2018-07-01 HISTORY — DX: Dyspnea, unspecified: R06.00

## 2018-07-01 HISTORY — DX: Chest pain, unspecified: R07.9

## 2018-07-01 HISTORY — DX: Nausea: R11.0

## 2018-07-01 HISTORY — DX: Essential (primary) hypertension: I10

## 2018-07-01 HISTORY — DX: Cardiac murmur, unspecified: R01.1

## 2018-07-01 HISTORY — DX: Diarrhea, unspecified: R19.7

## 2018-07-01 HISTORY — DX: Anorexia: R63.0

## 2018-07-01 HISTORY — DX: Gastro-esophageal reflux disease without esophagitis: K21.9

## 2018-07-01 HISTORY — DX: Urgency of urination: R39.15

## 2018-07-01 HISTORY — DX: Dependence on other enabling machines and devices: Z99.89

## 2018-07-01 HISTORY — DX: Other forms of dyspnea: R06.09

## 2018-07-01 LAB — URINALYSIS, ROUTINE W REFLEX MICROSCOPIC
Bilirubin Urine: NEGATIVE
GLUCOSE, UA: NEGATIVE mg/dL
HGB URINE DIPSTICK: NEGATIVE
KETONES UR: NEGATIVE mg/dL
Nitrite: NEGATIVE
PROTEIN: NEGATIVE mg/dL
Specific Gravity, Urine: 1.006 (ref 1.005–1.030)
pH: 5 (ref 5.0–8.0)

## 2018-07-01 LAB — COMPREHENSIVE METABOLIC PANEL
ALK PHOS: 133 U/L — AB (ref 38–126)
ALT: 22 U/L (ref 0–44)
AST: 34 U/L (ref 15–41)
Albumin: 4.3 g/dL (ref 3.5–5.0)
Anion gap: 15 (ref 5–15)
BILIRUBIN TOTAL: 1.1 mg/dL (ref 0.3–1.2)
BUN: 70 mg/dL — AB (ref 8–23)
CO2: 12 mmol/L — ABNORMAL LOW (ref 22–32)
CREATININE: 3.48 mg/dL — AB (ref 0.44–1.00)
Calcium: 10.4 mg/dL — ABNORMAL HIGH (ref 8.9–10.3)
Chloride: 106 mmol/L (ref 98–111)
GFR calc Af Amer: 14 mL/min — ABNORMAL LOW (ref >60)
GFR calc non Af Amer: 12 mL/min — ABNORMAL LOW (ref >60)
GLUCOSE: 88 mg/dL (ref 70–99)
POTASSIUM: 7 mmol/L — AB (ref 3.5–5.1)
Sodium: 133 mmol/L — ABNORMAL LOW (ref 135–145)
TOTAL PROTEIN: 8.4 g/dL — AB (ref 6.5–8.1)

## 2018-07-01 LAB — CBC WITH DIFFERENTIAL/PLATELET
Abs Immature Granulocytes: 0 10*3/uL (ref 0.0–0.1)
Basophils Absolute: 0 10*3/uL (ref 0.0–0.1)
Basophils Relative: 1 %
Eosinophils Absolute: 0.1 10*3/uL (ref 0.0–0.7)
Eosinophils Relative: 1 %
HCT: 33.1 % — ABNORMAL LOW (ref 36.0–46.0)
Hemoglobin: 9.8 g/dL — ABNORMAL LOW (ref 12.0–15.0)
Immature Granulocytes: 0 %
Lymphocytes Relative: 11 %
Lymphs Abs: 0.9 10*3/uL (ref 0.7–4.0)
MCH: 27.1 pg (ref 26.0–34.0)
MCHC: 29.6 g/dL — ABNORMAL LOW (ref 30.0–36.0)
MCV: 91.7 fL (ref 78.0–100.0)
Monocytes Absolute: 0.5 10*3/uL (ref 0.1–1.0)
Monocytes Relative: 6 %
Neutro Abs: 6.6 10*3/uL (ref 1.7–7.7)
Neutrophils Relative %: 81 %
Platelets: 497 10*3/uL — ABNORMAL HIGH (ref 150–400)
RBC: 3.61 MIL/uL — ABNORMAL LOW (ref 3.87–5.11)
RDW: 20.9 % — ABNORMAL HIGH (ref 11.5–15.5)
WBC: 8.1 10*3/uL (ref 4.0–10.5)

## 2018-07-01 LAB — RENAL FUNCTION PANEL
Albumin: 3.6 g/dL (ref 3.5–5.0)
Anion gap: 10 (ref 5–15)
BUN: 62 mg/dL — AB (ref 8–23)
CALCIUM: 9.6 mg/dL (ref 8.9–10.3)
CHLORIDE: 105 mmol/L (ref 98–111)
CO2: 13 mmol/L — AB (ref 22–32)
CREATININE: 3.15 mg/dL — AB (ref 0.44–1.00)
GFR, EST AFRICAN AMERICAN: 16 mL/min — AB (ref >60)
GFR, EST NON AFRICAN AMERICAN: 14 mL/min — AB (ref >60)
Glucose, Bld: 99 mg/dL (ref 70–99)
Phosphorus: 5.3 mg/dL — ABNORMAL HIGH (ref 2.5–4.6)
Potassium: 6.2 mmol/L — ABNORMAL HIGH (ref 3.5–5.1)
SODIUM: 128 mmol/L — AB (ref 135–145)

## 2018-07-01 LAB — MAGNESIUM: Magnesium: 1.7 mg/dL (ref 1.7–2.4)

## 2018-07-01 LAB — CBC
HEMATOCRIT: 34 % — AB (ref 36.0–46.0)
HEMOGLOBIN: 10.5 g/dL — AB (ref 12.0–15.0)
MCH: 26.9 pg (ref 26.0–34.0)
MCHC: 30.9 g/dL (ref 30.0–36.0)
MCV: 87 fL (ref 78.0–100.0)
Platelets: 548 10*3/uL — ABNORMAL HIGH (ref 150–400)
RBC: 3.91 MIL/uL (ref 3.87–5.11)
RDW: 20.4 % — AB (ref 11.5–15.5)
WBC: 9.5 10*3/uL (ref 4.0–10.5)

## 2018-07-01 LAB — BASIC METABOLIC PANEL
Anion gap: 16 — ABNORMAL HIGH (ref 5–15)
BUN: 65 mg/dL — ABNORMAL HIGH (ref 8–23)
CO2: 12 mmol/L — ABNORMAL LOW (ref 22–32)
Calcium: 10.1 mg/dL (ref 8.9–10.3)
Chloride: 101 mmol/L (ref 98–111)
Creatinine, Ser: 3.56 mg/dL — ABNORMAL HIGH (ref 0.44–1.00)
GFR calc Af Amer: 14 mL/min — ABNORMAL LOW (ref >60)
GFR calc non Af Amer: 12 mL/min — ABNORMAL LOW (ref >60)
Glucose, Bld: 83 mg/dL (ref 70–99)
Potassium: 7.2 mmol/L (ref 3.5–5.1)
Sodium: 129 mmol/L — ABNORMAL LOW (ref 135–145)

## 2018-07-01 LAB — TYPE AND SCREEN
ABO/RH(D): B POS
Antibody Screen: NEGATIVE

## 2018-07-01 LAB — CBG MONITORING, ED: Glucose-Capillary: 74 mg/dL (ref 70–99)

## 2018-07-01 LAB — PHOSPHORUS: Phosphorus: 5.9 mg/dL — ABNORMAL HIGH (ref 2.5–4.6)

## 2018-07-01 LAB — LACTATE DEHYDROGENASE: LDH: 125 U/L (ref 98–192)

## 2018-07-01 LAB — HEMOGLOBIN A1C
HEMOGLOBIN A1C: 5.6 % (ref 4.8–5.6)
Mean Plasma Glucose: 114.02 mg/dL

## 2018-07-01 LAB — CK: CK TOTAL: 51 U/L (ref 38–234)

## 2018-07-01 LAB — URIC ACID: URIC ACID, SERUM: 7.3 mg/dL — AB (ref 2.5–7.1)

## 2018-07-01 LAB — GLUCOSE, CAPILLARY: Glucose-Capillary: 75 mg/dL (ref 70–99)

## 2018-07-01 LAB — ABO/RH: ABO/RH(D): B POS

## 2018-07-01 MED ORDER — STERILE WATER FOR INJECTION IV SOLN
INTRAVENOUS | Status: DC
  Administered 2018-07-01 – 2018-07-03 (×5): via INTRAVENOUS
  Filled 2018-07-01 (×7): qty 850

## 2018-07-01 MED ORDER — ONDANSETRON HCL 4 MG/2ML IJ SOLN: 4.0000 mg | Freq: Four times a day (QID) | INTRAMUSCULAR | Status: DC | PRN

## 2018-07-01 MED ORDER — ACETAMINOPHEN 325 MG PO TABS: 650.0000 mg | ORAL_TABLET | Freq: Four times a day (QID) | ORAL | Status: DC | PRN

## 2018-07-01 MED ORDER — CAMPHOR-MENTHOL 0.5-0.5 % EX LOTN
1.0000 "application " | TOPICAL_LOTION | Freq: Three times a day (TID) | CUTANEOUS | Status: DC | PRN
  Filled 2018-07-01: qty 222

## 2018-07-01 MED ORDER — HYDROXYZINE HCL 25 MG PO TABS
25.0000 mg | ORAL_TABLET | Freq: Three times a day (TID) | ORAL | Status: DC | PRN
  Filled 2018-07-01: qty 1

## 2018-07-01 MED ORDER — CALCIUM CARBONATE ANTACID 1250 MG/5ML PO SUSP
500.0000 mg | Freq: Four times a day (QID) | ORAL | Status: DC | PRN
  Filled 2018-07-01: qty 5

## 2018-07-01 MED ORDER — CALCIUM GLUCONATE 10 % IV SOLN
1.0000 g | Freq: Once | INTRAVENOUS | Status: AC
  Administered 2018-07-01: 1 g via INTRAVENOUS
  Filled 2018-07-01: qty 10

## 2018-07-01 MED ORDER — DOCUSATE SODIUM 283 MG RE ENEM
1.0000 | ENEMA | RECTAL | Status: DC | PRN
  Filled 2018-07-01: qty 1

## 2018-07-01 MED ORDER — SODIUM POLYSTYRENE SULFONATE 15 GM/60ML PO SUSP
30.0000 g | Freq: Once | ORAL | Status: AC
  Administered 2018-07-01: 30 g via ORAL
  Filled 2018-07-01: qty 120

## 2018-07-01 MED ORDER — PATIROMER SORBITEX CALCIUM 8.4 G PO PACK
16.8000 g | PACK | Freq: Once | ORAL | Status: AC
  Administered 2018-07-01: 16.8 g via ORAL
  Filled 2018-07-01: qty 2

## 2018-07-01 MED ORDER — MELATONIN 3 MG PO TABS
3.0000 mg | ORAL_TABLET | Freq: Every evening | ORAL | Status: DC | PRN
  Administered 2018-07-01 – 2018-07-02 (×2): 6 mg via ORAL
  Filled 2018-07-01 (×3): qty 2

## 2018-07-01 MED ORDER — NEPRO/CARBSTEADY PO LIQD
237.0000 mL | Freq: Three times a day (TID) | ORAL | Status: DC | PRN
  Filled 2018-07-01: qty 237

## 2018-07-01 MED ORDER — POLYETHYL GLYCOL-PROPYL GLYCOL 0.4-0.3 % OP GEL
Freq: Every day | OPHTHALMIC | Status: DC | PRN
  Filled 2018-07-01: qty 10

## 2018-07-01 MED ORDER — ENOXAPARIN SODIUM 30 MG/0.3ML ~~LOC~~ SOLN
30.0000 mg | SUBCUTANEOUS | Status: DC
  Administered 2018-07-01 – 2018-07-03 (×3): 30 mg via SUBCUTANEOUS
  Filled 2018-07-01 (×3): qty 0.3

## 2018-07-01 MED ORDER — HYDROCODONE-ACETAMINOPHEN 5-325 MG PO TABS
1.0000 | ORAL_TABLET | Freq: Four times a day (QID) | ORAL | Status: DC | PRN
  Administered 2018-07-01 – 2018-07-04 (×6): 1 via ORAL
  Filled 2018-07-01 (×6): qty 1

## 2018-07-01 MED ORDER — ACETAMINOPHEN 650 MG RE SUPP: 650.0000 mg | Freq: Four times a day (QID) | RECTAL | Status: DC | PRN

## 2018-07-01 MED ORDER — INSULIN ASPART 100 UNIT/ML IV SOLN: 5.0000 [IU] | Freq: Once | INTRAVENOUS | Status: DC

## 2018-07-01 MED ORDER — ONDANSETRON HCL 4 MG/2ML IJ SOLN
4.0000 mg | Freq: Four times a day (QID) | INTRAMUSCULAR | Status: DC | PRN
  Administered 2018-07-02 – 2018-07-03 (×2): 4 mg via INTRAVENOUS
  Filled 2018-07-01 (×2): qty 2

## 2018-07-01 MED ORDER — ALLOPURINOL 100 MG PO TABS
100.0000 mg | ORAL_TABLET | Freq: Two times a day (BID) | ORAL | Status: DC
  Administered 2018-07-01 – 2018-07-04 (×6): 100 mg via ORAL
  Filled 2018-07-01 (×6): qty 1

## 2018-07-01 MED ORDER — ZOLPIDEM TARTRATE 5 MG PO TABS
5.0000 mg | ORAL_TABLET | Freq: Every evening | ORAL | Status: DC | PRN
  Administered 2018-07-03: 5 mg via ORAL
  Filled 2018-07-01: qty 1

## 2018-07-01 MED ORDER — VERAPAMIL HCL ER 240 MG PO TBCR
240.0000 mg | EXTENDED_RELEASE_TABLET | Freq: Every day | ORAL | Status: DC
Start: 2018-07-01 — End: 2018-07-02
  Administered 2018-07-01: 240 mg via ORAL
  Filled 2018-07-01: qty 1

## 2018-07-01 MED ORDER — PANTOPRAZOLE SODIUM 40 MG PO TBEC
40.0000 mg | DELAYED_RELEASE_TABLET | Freq: Every day | ORAL | Status: DC
  Administered 2018-07-01 – 2018-07-04 (×4): 40 mg via ORAL
  Filled 2018-07-01 (×4): qty 1

## 2018-07-01 MED ORDER — ONDANSETRON HCL 4 MG PO TABS: 4.0000 mg | ORAL_TABLET | Freq: Four times a day (QID) | ORAL | Status: DC | PRN

## 2018-07-01 MED ORDER — ONDANSETRON HCL 4 MG PO TABS
4.0000 mg | ORAL_TABLET | Freq: Four times a day (QID) | ORAL | Status: DC | PRN
  Administered 2018-07-03: 4 mg via ORAL
  Filled 2018-07-01: qty 1

## 2018-07-01 MED ORDER — DEXTROSE 10 % IV SOLN
Freq: Once | INTRAVENOUS | Status: AC
  Administered 2018-07-01: 18:00:00 via INTRAVENOUS

## 2018-07-01 MED ORDER — SODIUM CHLORIDE 0.9% FLUSH
3.0000 mL | Freq: Two times a day (BID) | INTRAVENOUS | Status: DC
  Administered 2018-07-01 – 2018-07-04 (×3): 3 mL via INTRAVENOUS

## 2018-07-01 MED ORDER — INSULIN ASPART 100 UNIT/ML IV SOLN: 10.0000 [IU] | Freq: Once | INTRAVENOUS | Status: DC

## 2018-07-01 MED ORDER — INSULIN ASPART 100 UNIT/ML ~~LOC~~ SOLN
5.0000 [IU] | Freq: Once | SUBCUTANEOUS | Status: AC
  Administered 2018-07-01: 5 [IU] via INTRAVENOUS
  Filled 2018-07-01: qty 1

## 2018-07-01 MED ORDER — INSULIN ASPART 100 UNIT/ML ~~LOC~~ SOLN: 0.0000 [IU] | Freq: Three times a day (TID) | SUBCUTANEOUS | Status: DC

## 2018-07-01 MED ORDER — SORBITOL 70 % SOLN: 30.0000 mL | Status: DC | PRN

## 2018-07-01 MED ORDER — SODIUM CHLORIDE 0.9 % IV BOLUS
1000.0000 mL | Freq: Once | INTRAVENOUS | Status: AC
  Administered 2018-07-01: 1000 mL via INTRAVENOUS

## 2018-07-01 NOTE — Progress Notes (Addendum)
Pt CBG at PAT , 77.  Per pt asymptomatic stated no appetite and had not eaten.  Apple juice given. Pt did want anything else.  Pt unable to give urine speciman, stated she had already taken oxybutynin prior to PAT approintment.  Pt requested if she can take it home and bring back. Called via phone for [facility] cross NP advise.  Per Lenna Sciara , ok for pt to take home and bring back to PST testing tomorrow, 07-02-2018.

## 2018-07-01 NOTE — ED Triage Notes (Signed)
Pt presents for hyperkalemia that was noted from lab work taken at [place] center for preop procedure next week.  Pt denies any chest pain or shortness of breath.  K+ noted to be 7.

## 2018-07-01 NOTE — ED Provider Notes (Signed)
[name] DEPARTMENT Provider Note   CSN: [id number] Arrival date & time: 07/01/18  1339     History   Chief Complaint No chief complaint on file.   HPI Madison Holden is a 72 y.o. female with past medical history of hypertension, endometrial cancer, gout, type 2 diabetes, CAD, presenting to the ED with elevated potassium level.  Patient seen at [facility] and received preop labs for surgery for endometrial cancer and resulted with potassium level of 7.  She was instructed to report immediate to the ED.  She states for the past 2 weeks she has been feeling fatigued with weakness of her upper and lower extremities bilaterally.  Also reports associated decreased appetite.  States she has not had issues with her potassium level in the past nor does she have a history of renal failure.  Denies chest pain or shortness of breath, numbness or tingling, abdominal symptoms.    Past Medical History:  Diagnosis Date  . Ambulates with cane    due to gout   . Arthritis    hands  . Chest pain, unspecified 07-01-2018  per pt had 2 day chest pain and sob  6 weeks ago,, per pt no symptoms since   cardiology-- dr Salley Scarlet--  scheduled for stress test and echo 07-03-2018 prior to surgery scheduled 07-08-2018  . Diarrhea, unspecified    intermittant  . DOE (dyspnea on exertion)   . Endometrial cancer ([place])   . Essential hypertension   . Frequency of urination   . GERD (gastroesophageal reflux disease)   . Gout rheumologist-- michelle young PA ([facility])   07-01-2018 per pt flare up april 2019 , hands and wrists  . Heart murmur   . History of adenomatous polyp of colon   . History of esophageal dilatation    for stricture  . Mild nausea    per pt intermittant , no vomiting  . Mixed dyslipidemia   . Muscle weakness of extremity    bilateral upper and lower extremities due to gout  . Poor appetite    per pt   . Type 2 diabetes mellitus ([place])     followed by pcp  . Urgency of urination   . Urine frequency     Patient Active Problem List   Diagnosis Date Noted  . Acute renal failure (ARF) ([place]) 07/01/2018  . Endometrial carcinoma ([place]) 07/01/2018  . Gout 07/01/2018  . Hyperkalemia 07/01/2018  . Morbid obesity ([place]) 06/26/2018  . Chest pain 06/26/2018  . Pre-operative cardiovascular examination 06/26/2018  . Diabetes mellitus ([place]) 06/18/2018  . 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 CORONARY ATHEROSCLEROSIS 05/02/2009  . Mixed dyslipidemia 04/22/2009  . Essential hypertension 04/15/2008  . VENTRICULAR HYPERTROPHY, LEFT 04/15/2008  . ESOPHAGEAL STRICTURE 04/15/2008  . GERD 04/15/2008    Past Surgical History:  Procedure Laterality Date  . BREAST CYST EXCISION Right 1974   benign per pt  . CATARACT EXTRACTION W/ INTRAOCULAR LENS  IMPLANT, BILATERAL  2014;  2017  . MOUTH SURGERY  11/2017   Upper quadrants,  per pt peridontist did laser procedure on gums  . TONSILLECTOMY  child     OB History   None      Home Medications    Prior to Admission medications   Medication Sig Start Date End Date Taking? Authorizing Provider  allopurinol (ZYLOPRIM) 100 MG tablet Take 100 mg by mouth 2 (two) times daily. For Gout   Yes [provider]  colchicine 0.6  MG tablet Take 0.6 mg by mouth 2 (two) times daily. For Gout   Yes [provider]  diphenhydrAMINE (BENADRYL) 25 MG tablet Take 25 mg by mouth at bedtime as needed for sleep.    Yes [provider]  esomeprazole (NEXIUM) 20 MG capsule Take 20 mg by mouth 3 (three) times a week.   Yes [provider]  hydrochlorothiazide (HYDRODIURIL) 25 MG tablet TAKE ONE-HALF TABLET BY MOUTH ONCE DAILY Patient taking differently: Take 12.5 mg by mouth daily with lunch.  09/30/13  Yes Fay Records, MD  HYDROcodone-acetaminophen (NORCO/VICODIN) 5-325 MG tablet Take 1 tablet by mouth every 6 (six) hours as needed for moderate pain.   Yes [provider]    lisinopril (PRINIVIL,ZESTRIL) 40 MG tablet Take 40 mg by mouth daily with lunch.    Yes [provider]  Loperamide HCl (IMODIUM A-D PO) Take 1 capsule by mouth as needed.    Yes [provider]  Magnesium 500 MG TABS Take 500 mg by mouth every other day.   Yes [provider]  Melatonin 3 MG TABS Take 3-6 mg by mouth at bedtime as needed (sleep).    Yes [provider]  metFORMIN (GLUCOPHAGE) 1000 MG tablet Take 1,000 mg by mouth 2 (two) times daily with a meal.   Yes [provider]  oxybutynin (DITROPAN) 5 MG tablet Take 5 mg by mouth daily. May take a second 5 mg dose as needed for bladder spasms   Yes [provider]  pioglitazone (ACTOS) 15 MG tablet Take 7.5 mg by mouth daily.   Yes [provider]  Polyethyl Glycol-Propyl Glycol (SYSTANE OP) Place 1 drop into both eyes daily as needed (dry eyes).   Yes [provider]  verapamil (CALAN-SR) 240 MG CR tablet Take 240 mg by mouth at bedtime.   Yes [provider]  vitamin C (ASCORBIC ACID) 500 MG tablet Take 500 mg by mouth 4 (four) times a week.   Yes [provider]  PRESCRIPTION MEDICATION Per pt rheumotologist has written prescription for nausea today 07-01-2018 , unsure of name , has not pick up from pharmacy yet.  Asked pt to bring drug name dos.    [provider]    Family History Family History  Problem Relation Age of Onset  . Heart disease Mother   . Heart failure Mother   . Heart disease Father   . Prostate cancer Father   . Clotting disorder Father   . Diabetes Maternal Grandfather   . Colon cancer Neg Hx     Social History Social History   Tobacco Use  . Smoking status: Never Smoker  . Smokeless tobacco: Never Used  Substance Use Topics  . Alcohol use: Not Currently    Comment: 2-3 per  week, not currently drinking since Apri; 2019  . Drug use: No     Allergies   Patient has no known allergies.   Review of  Systems Review of Systems  Constitutional: Positive for appetite change and fatigue.  Respiratory: Negative for shortness of breath.   Cardiovascular: Negative for chest pain and palpitations.  Gastrointestinal: Negative for abdominal pain.  Neurological: Positive for weakness (generalized). Negative for numbness.  All other systems reviewed and are negative.    Physical Exam Updated Vital Signs BP 96/71 (BP Location: Left Arm)   Pulse 93   Temp 98.1 F (36.7 C) (Oral)   Resp 19   SpO2 98%   Physical Exam  Constitutional: She is oriented to person, place, and time. She appears well-developed and well-nourished. No distress.  HENT:  Head: Normocephalic and atraumatic.  Mouth/Throat: Oropharynx is clear and moist.  Eyes: Conjunctivae are normal.  Neck: Normal range of motion. Neck supple.  Cardiovascular: Normal rate and regular rhythm.  Murmur heard. Pulmonary/Chest: Effort normal. No respiratory distress.  Abdominal: Soft. Bowel sounds are normal. She exhibits no distension and no mass. There is no tenderness. There is no guarding.  Musculoskeletal: Normal range of motion.  Neurological: She is alert and oriented to person, place, and time.  Skin: Skin is warm.  Psychiatric: She has a normal mood and affect. Her behavior is normal.  Nursing note and vitals reviewed.    ED Treatments / Results  Labs (all labs ordered are listed, but only abnormal results are displayed) Labs Reviewed  BASIC METABOLIC PANEL - Abnormal; Notable for the following components:      Result Value   Sodium 129 (*)    Potassium 7.2 (*)    CO2 12 (*)    BUN 65 (*)    Creatinine, Ser 3.56 (*)    GFR calc non Af Amer 12 (*)    GFR calc Af Amer 14 (*)    Anion gap 16 (*)    All other components within normal limits  CBC WITH DIFFERENTIAL/PLATELET - Abnormal; Notable for the following components:   RBC 3.61 (*)    Hemoglobin 9.8 (*)    HCT 33.1 (*)    MCHC 29.6 (*)    RDW 20.9 (*)     Platelets 497 (*)    All other components within normal limits  PHOSPHORUS - Abnormal; Notable for the following components:   Phosphorus 5.9 (*)    All other components within normal limits  URIC ACID - Abnormal; Notable for the following components:   Uric Acid, Serum 7.3 (*)    All other components within normal limits  RENAL FUNCTION PANEL - Abnormal; Notable for the following components:   Sodium 128 (*)    Potassium 6.2 (*)    CO2 13 (*)    BUN 62 (*)    Creatinine, Ser 3.15 (*)    Phosphorus 5.3 (*)    GFR calc non Af Amer 14 (*)    GFR calc Af Amer 16 (*)    All other components within normal limits  MAGNESIUM  LACTATE DEHYDROGENASE  CK  HEMOGLOBIN A1C  NA AND K (SODIUM & POTASSIUM), RAND UR  URINALYSIS, ROUTINE W REFLEX MICROSCOPIC  HIV ANTIBODY (ROUTINE TESTING)  BASIC METABOLIC PANEL  CBC  CBG MONITORING, ED  TYPE AND SCREEN  ABO/RH    EKG None  ED ECG REPORT   Date: 07/01/2018  EKG Time: 1622  Rate: 77  Rhythm: normal sinus rhythm,  normal EKG, normal sinus rhythm, unchanged from previous tracings QRS: 94 ms  QT/QTc: 352/399  Intervals:none   Radiology US Renal  Result Date: 07/01/2018 CLINICAL DATA:  Acute kidney injury EXAM: RENAL / URINARY TRACT ULTRASOUND COMPLETE COMPARISON:  None. FINDINGS: Right Kidney: Length: 10.8 cm. Echogenicity within normal limits. No mass or hydronephrosis visualized. Left Kidney: Length: 11.5 cm. Echogenicity within normal limits. No mass or hydronephrosis visualized. Bladder: Appears normal for degree of bladder distention. IMPRESSION: Negative renal ultrasound. Electronically Signed   By: Donavan Foil M.D.   On: 07/01/2018 18:58    Procedures Procedures (including critical care time)  CRITICAL CARE Performed by: [place] N Jarian Longoria   Total critical care time:  35 minutes  Critical care time was exclusive of separately billable procedures and treating other patients.  Critical care was necessary to treat or prevent  imminent or life-threatening deterioration.  Critical care was time spent personally by me on the following activities: development of treatment plan with patient and/or surrogate as well as nursing, discussions with consultants, evaluation of patient's response to treatment, examination of patient, obtaining history from patient or surrogate, ordering and performing treatments and interventions, ordering and review of laboratory studies, ordering and review of radiographic studies, pulse oximetry and re-evaluation of patient's condition.  Medications Ordered in ED Medications  patiromer Daryll Drown) packet 16.8 g (has no administration in time range)  sodium bicarbonate 150 mEq in sterile water 1,000 mL infusion ( Intravenous New Bag/Given 07/01/18 1933)  sodium polystyrene (KAYEXALATE) 15 GM/60ML suspension 30 g (has no administration in time range)  allopurinol (ZYLOPRIM) tablet 100 mg (has no administration in time range)  HYDROcodone-acetaminophen (NORCO/VICODIN) 5-325 MG per tablet 1 tablet (has no administration in time range)  verapamil (CALAN-SR) CR tablet 240 mg (has no administration in time range)  pantoprazole (PROTONIX) EC tablet 40 mg (has no administration in time range)  Melatonin TABS 3-6 mg (has no administration in time range)  polyethylene glycol 0.4% and propylene glycol 0.3% (SYSTANE) ophthalmic gel (has no administration in time range)  acetaminophen (TYLENOL) tablet 650 mg (has no administration in time range)    Or  acetaminophen (TYLENOL) suppository 650 mg (has no administration in time range)  ondansetron (ZOFRAN) tablet 4 mg (has no administration in time range)    Or  ondansetron (ZOFRAN) injection 4 mg (has no administration in time range)  zolpidem (AMBIEN) tablet 5 mg (has no administration in time range)  sorbitol 70 % solution 30 mL (has no administration in time range)  docusate sodium (ENEMEEZ) enema 283 mg (has no administration in time range)    camphor-menthol (SARNA) lotion 1 application (has no administration in time range)    And  hydrOXYzine (ATARAX/VISTARIL) tablet 25 mg (has no administration in time range)  calcium carbonate (dosed in mg elemental calcium) suspension 500 mg of elemental calcium (has no administration in time range)  feeding supplement (NEPRO CARB STEADY) liquid 237 mL (has no administration in time range)  enoxaparin (LOVENOX) injection 30 mg (has no administration in time range)  sodium chloride flush (NS) 0.9 % injection 3 mL (has no administration in time range)  insulin aspart (novoLOG) injection 0-9 Units (has no administration in time range)  calcium gluconate inj 10% (1 g) URGENT USE ONLY! (1 g Intravenous Given 07/01/18 1805)  dextrose 10 % infusion ( Intravenous New Bag/Given 07/01/18 1815)  insulin aspart (novoLOG) injection 5 Units (5 Units Intravenous Given 07/01/18 1805)  sodium chloride 0.9 % bolus 1,000 mL (1,000 mLs Intravenous New Bag/Given 07/01/18 1816)     Initial Impression / Assessment and Plan / ED Course  I have reviewed the triage vital signs and the nursing notes.  Pertinent labs & imaging results that were available during my care of the patient were reviewed by me and considered in my medical decision making (see chart for details).  Clinical Course as of Jul 01 2225  Tue Jul 01, 2018  1615 EKG without acute changes.   [JR]  1645 Spoke with nephrology, recommends addition of IVF and does not recommend emergent dialysis at this time, agrees with interventions at this time. Will attempt to manage initially with IV medications. Will consult hospitalist for admission.   [  JR]  1716 Dr. Lorin Mercy accepting medical admission. Requesting order for kayexalate.   [JR]    Clinical Course User Index [JR] Lasheena Frieze, [place] N, PA-C   Patient presenting to the ED with preop lab work showing elevated potassium.  On arrival, patient reports few days of nausea vomiting as well as decreased  appetite. EKG without acute changes.  Potassium is severely elevated to 7.2 with a creatinine of 3.58.  Vital signs are stable.  Initiated IV calcium gluconate, insulin, and dextrose.  Consult placed to nephrology, Dr. Carolin Sicks recommends adding IV fluids though agreeable to intervention at this time.  He does not commend emergent dialysis given normal EKG.  Recommends medical admission.  Dr. Lorin Mercy accepting admission, and recommending administration of Kayexalate in addition.  Patient is agreeable to plan and stable throughout ED course.  Patient discussed with Dr. Sedonia Small who agrees with care plan.  The patient appears reasonably stabilized for admission considering the current resources, flow, and capabilities available in the ED at this time, and I doubt any other [facility] requiring further screening and/or treatment in the ED prior to admission.   Final Clinical Impressions(s) / ED Diagnoses   Final diagnoses:  AKI (acute kidney injury) [facility])    ED Discharge Orders    None       Kerensa Nicklas, [place] N, PA-C 07/01/18 2338    Maudie Flakes, MD 07/02/18 [phone]

## 2018-07-01 NOTE — Progress Notes (Signed)
EKG 06-26-2018 in epic. Pt pending cardiac clearance prior to surgery 07-08-2018.  Has stress test and echo scheduled for 07-03-2018, dr Salley Scarlet.

## 2018-07-01 NOTE — Telephone Encounter (Signed)
I called patient to let her know that her pre-operative lab work came back with some critical lab panic values.  Per Melissa Cross,NP potassium level is critically high and kidney function is very elevated, and this is very deadly.  I told patient that per Joylene John, NP that she needs to go to [facility] Emergency Room department immediately.  I ask patient did she have someone to drive her and she told me yes.  Patient verbalized understanding and will be in route to the emergency room shortly.

## 2018-07-01 NOTE — H&P (Signed)
History and Physical    Madison Holden FIE:[id number] DOB: [birth year] DOA: 07/01/2018  PCP: Orpah Melter, MD Consultants:  Luan Pulling - rheumatology; Denman George - Gyn/Onc; Philis Pique - OB/Gyn; Ross/Revankar - cardiology Patient coming from:  Home - lives with husband; NOK: Husband, [phone]   Chief Complaint: Abnormal labs  HPI: Madison Holden is a 72 y.o. female with medical history significant of DM; HLD; GERD; HTN; and endometrial cancer presenting with abnormal labs.  About 2-3 weeks ago, her OB/Gyn saw abnormal labs and recommended uterus scraping.  She was placed under anesthesia and diagnosed endometrial cancer (likely confined); she will need a total hysterectomy but hopefully no chemo/rads.  Prior to that, she had her first flare-up of gout in April, diagnosed in May.  She has tried a variety of drugs (Mobic, Allopurinol, Colchicine, prednisone for about 6 weeks).  Within the last 10 days, she is taking hydrocodone for the pain in addition to the other gout medications.  At pre-op today for the hysterectomy, she as found to have acute renal failure with hyperkalemia.  She has been feeling okay except for the gout, with flare-ups in her hands, shoulders, and other joints.  She has been feeling like she was going downhill with all these medications and she has been having a hard time walking.  She can't eat/drink.  +n/v x the last week.  + diarrhea x 2 weeks, once or twice a day.   She is scheduled for TAH on 9/17.  She did have CP/SOB a few weeks ago and she is scheduled to a pre-op stress test over the next few days.  ED Course:  Severe hyperkalemia.  H/o endometrial CA, getting pre-op labs for hysterectomy.  K+ 7.0, 7.2  Nephrology consulted - suggest calcium, insulin, IVF.  They do not recommend emergent dialysis.  Kayexalate has not been given, but will be now.  Review of Systems: As per HPI; otherwise review of systems reviewed and negative.   Ambulatory Status:  Ambulates with a  cane  Past Medical History:  Diagnosis Date  . Ambulates with cane    due to gout   . Arthritis    hands  . Chest pain, unspecified 07-01-2018  per pt had 2 day chest pain and sob  6 weeks ago,, per pt no symptoms since   cardiology-- dr Salley Scarlet--  scheduled for stress test and echo 07-03-2018 prior to surgery scheduled 07-08-2018  . Diarrhea, unspecified    intermittant  . DOE (dyspnea on exertion)   . Endometrial cancer ([place])   . Essential hypertension   . Frequency of urination   . GERD (gastroesophageal reflux disease)   . Gout rheumologist-- michelle young PA ([facility])   07-01-2018 per pt flare up april 2019 , hands and wrists  . Heart murmur   . History of adenomatous polyp of colon   . History of esophageal dilatation    for stricture  . Mild nausea    per pt intermittant , no vomiting  . Mixed dyslipidemia   . Muscle weakness of extremity    bilateral upper and lower extremities due to gout  . Poor appetite    per pt   . Type 2 diabetes mellitus ([place])    followed by pcp  . Urgency of urination   . Urine frequency     Past Surgical History:  Procedure Laterality Date  . BREAST CYST EXCISION Right 1974   benign per pt  . CATARACT EXTRACTION W/ INTRAOCULAR LENS  IMPLANT,  BILATERAL  2014;  2017  . MOUTH SURGERY  11/2017   Upper quadrants,  per pt peridontist did laser procedure on gums  . TONSILLECTOMY  child    Social History   Socioeconomic History  . Marital status: Married    Spouse name: Not on file  . Number of children: 0  . Years of education: Not on file  . Highest education level: Not on file  Occupational History  . Occupation: retired  Scientific laboratory technician  . Financial resource strain: Not on file  . Food insecurity:    Worry: Not on file    Inability: Not on file  . Transportation needs:    Medical: Not on file    Non-medical: Not on file  Tobacco Use  . Smoking status: Never Smoker  . Smokeless tobacco: Never Used    Substance and Sexual Activity  . Alcohol use: Not Currently    Comment: 2-3 per  week, not currently drinking since Apri; 2019  . Drug use: No  . Sexual activity: Not on file  Lifestyle  . Physical activity:    Days per week: Not on file    Minutes per session: Not on file  . Stress: Not on file  Relationships  . Social connections:    Talks on phone: Not on file    Gets together: Not on file    Attends religious service: Not on file    Active member of club or organization: Not on file    Attends meetings of clubs or organizations: Not on file    Relationship status: Not on file  . Intimate partner violence:    Fear of current or ex partner: Not on file    Emotionally abused: Not on file    Physically abused: Not on file    Forced sexual activity: Not on file  Other Topics Concern  . Not on file  Social History Narrative   Has never smoked. Daily Caffeine Use- 2 cups per day. Does not get regular exercise. Retired.     No Known Allergies  Family History  Problem Relation Age of Onset  . Heart disease Mother   . Heart failure Mother   . Heart disease Father   . Prostate cancer Father   . Clotting disorder Father   . Diabetes Maternal Grandfather   . Colon cancer Neg Hx     Prior to Admission medications   Medication Sig Start Date End Date Taking? Authorizing Provider  allopurinol (ZYLOPRIM) 100 MG tablet Take 100 mg by mouth 2 (two) times daily. For Gout    [provider]  aspirin 325 MG tablet Take 650 mg by mouth daily as needed for moderate pain.    [provider]  colchicine 0.6 MG tablet Take 0.6 mg by mouth 2 (two) times daily. For Gout    [provider]  diphenhydrAMINE (BENADRYL) 25 MG tablet Take 25 mg by mouth at bedtime as needed for sleep.     [provider]  esomeprazole (NEXIUM) 20 MG capsule Take 20 mg by mouth 3 (three) times a week.    [provider]  hydrochlorothiazide (HYDRODIURIL) 25 MG tablet  TAKE ONE-HALF TABLET BY MOUTH ONCE DAILY Patient taking differently: Take 12.5 mg by mouth daily with lunch.  09/30/13   Fay Records, MD  HYDROcodone-acetaminophen (NORCO/VICODIN) 5-325 MG tablet Take 1 tablet by mouth every 6 (six) hours as needed for moderate pain.    [provider]  lisinopril (PRINIVIL,ZESTRIL)  40 MG tablet Take 40 mg by mouth daily with lunch.     [provider]  Loperamide HCl (IMODIUM A-D PO) Take by mouth as needed.    [provider]  Magnesium 500 MG TABS Take 500 mg by mouth every other day.    [provider]  Melatonin 3 MG TABS Take 3-6 mg by mouth at bedtime as needed (sleep).     [provider]  meloxicam (MOBIC) 15 MG tablet Take 15 mg by mouth every evening.     [provider]  metFORMIN (GLUCOPHAGE) 1000 MG tablet Take 1,000 mg by mouth 2 (two) times daily with a meal.    [provider]  oxybutynin (DITROPAN) 5 MG tablet Take 5 mg by mouth daily. May take a second 5 mg dose as needed for bladder spasms    [provider]  pioglitazone (ACTOS) 15 MG tablet Take 7.5 mg by mouth daily.    [provider]  Polyethyl Glycol-Propyl Glycol (SYSTANE OP) Place 1 drop into both eyes daily as needed (dry eyes).    [provider]  PRESCRIPTION MEDICATION Per pt rheumotologist has written prescription for nausea today 07-01-2018 , unsure of name , has not pick up from pharmacy yet.  Asked pt to bring drug name dos.    [provider]  verapamil (CALAN-SR) 240 MG CR tablet Take 240 mg by mouth at bedtime.    [provider]  vitamin C (ASCORBIC ACID) 500 MG tablet Take 500 mg by mouth 4 (four) times a week.    [provider]    Physical Exam: Vitals:   07/01/18 1356 07/01/18 1523 07/01/18 1915 07/01/18 1930  BP: (!) 105/54 (!) 112/56 (!) 118/58 117/64  Pulse: 92 90 82 86  Resp: 18 16 12 19   Temp: 98 F (36.7 C)     TempSrc: Oral     SpO2: 97%  98% 98% 99%     General:  Appears calm and comfortable and is NAD Eyes:  PERRL, EOMI, normal lids, iris ENT:  grossly normal hearing, lips & tongue, mmm; appropriate dentition Neck:  no LAD, masses or thyromegaly; no carotid bruits Cardiovascular:  RRR, no r/g, 7-1/6 systolic murmur. No LE edema.  Respiratory:   CTA bilaterally with no wheezes/rales/rhonchi.  Normal respiratory effort. Abdomen:  soft, NT, ND, NABS Back:   normal alignment, no CVAT Skin:  no rash or induration seen on limited exam Musculoskeletal:  grossly normal tone BUE/BLE, good ROM, no bony abnormality Lower extremity:  No LE edema.  Limited foot exam with no ulcerations.  2+ distal pulses. Psychiatric:  Mildly anxious mood and affect, speech fluent and appropriate, AOx3 Neurologic:  CN 2-12 grossly intact, moves all extremities in coordinated fashion, sensation intact    Radiological Exams on Admission: US Renal  Result Date: 07/01/2018 CLINICAL DATA:  Acute kidney injury EXAM: RENAL / URINARY TRACT ULTRASOUND COMPLETE COMPARISON:  None. FINDINGS: Right Kidney: Length: 10.8 cm. Echogenicity within normal limits. No mass or hydronephrosis visualized. Left Kidney: Length: 11.5 cm. Echogenicity within normal limits. No mass or hydronephrosis visualized. Bladder: Appears normal for degree of bladder distention. IMPRESSION: Negative renal ultrasound. Electronically Signed   By: Donavan Foil M.D.   On: 07/01/2018 18:58    EKG: Independently reviewed.  NSR with rate 77; nonspecific ST changes with no evidence of acute ischemia, arrhythmias, or peaked T waves   Labs on Admission: I have personally reviewed the available labs and imaging studies at  the time of the admission.  Pertinent labs:   Na++ 129 K+ 7.2, 7.0 this AM CO2 12 BUN 65/Creatinine 3.56/GFR 12 Anion gap 16 Phos 5.9 WBC 8.1 Hgb 9.8 Platelets 497  Assessment/Plan Principal Problem:   Acute renal failure (ARF) (HCC) Active Problems:   Essential  hypertension   Diabetes mellitus (HCC)   Endometrial carcinoma (HCC)   Gout   Hyperkalemia   Acute renal failure -Baseline creatinine is 0.7 (2010) -Today's creatinine is 3.56. -Likely due to prerenal failure secondary to dehydration and continuation of ACEI, diruetics, NSAIDs, and other nephrotoxic medications. -Hold all nephrotoxic medications for now -IVF- bicarbonate infusion started by nephrology -US-renal -Follow up renal [facility] by [facility] -Nephrology consultation -She reports n/v/d but suspect that this is resulting from renal failure rather than causing; if this continues with normalization of her BUN/creatinine, she may need further evaluation  Hyperkalemia -Patient with acute/subacute hyperkalemia which appears to be resulting from acute renal failureI due to volume deficiency in setting of multiple nephrotoxic medications -Was given 1L NS bolus and insulin/glucose in ER -EKG and telemetry appear to be normal but will give Calcium gluconate now and continue to closely monitor on telemetry -Bicarbonate infusion started by nephrology -Will give one-time dose of kayexalate  Endometrial carcinoma -Patient was incidentally found to have lab abnormalities at pre-op evaluation today -She is also scheduled to have pre-op stress test and echo later this week; these will likely need to be delayed (depending on how long she is hospitalized) -The only OR date available for this month was next week, and she must have cardiac clearance done prior -She does not currently have an indication for in-hospital cardiology exam or stress/echo -Unfortunately, her surgery may need to be delayed  DM -Will check A1c -hold Glucophage and Actos -Cover with sensitive-scale SSI  HTN -Hold home medications including HCTZ and Lisinopril -Continue Verapamil -Could add prn iv hydralazine if needed  Gout -Patient is thought to have gout, although she has chronic pain in her hands primarily associated  with her issue -Additionally, she has been given Allopurinol; colchicine; NSAIDs; and a recent course of prednisone - all without resolution -An alternative connective-tissue disorder might need to be considered -Regardless of the diagnosis, at this time she cannot take any of the prior medications given other than low-dose Allopurinol and Norco -She is likely to need outpatient rheumatology follow-up   DVT prophylaxis:  Lovenox Code Status:  Full - confirmed with patient/family Family Communication: Husband present throughout evaluation  Disposition Plan:  Home once clinically improved Consults called: Nephrology  Admission status: Admit - It is my clinical opinion that admission to INPATIENT is reasonable and necessary because of the expectation that this patient will require hospital care that crosses at least 2 midnights to treat this condition based on the medical complexity of the problems presented.  Given the aforementioned information, the predictability of an adverse outcome is felt to be significant.    Karmen Bongo MD [facility]  If note is complete, please contact covering daytime or nighttime physician. [email or web address] Password [email or web address]  07/01/2018, 7:40 PM

## 2018-07-01 NOTE — ED Notes (Signed)
IV Team at bedside 

## 2018-07-01 NOTE — Progress Notes (Signed)
CRITICAL VALUE ALERT  Critical Value:  7.0  Date & Time Notied:  07-01-2018 @ 3151  Provider Notified:  Joylene John NP  Orders Received/Actions taken:  Spoke w/ melissa via phone , Lenna Sciara stated she will call pt.  No orders given.

## 2018-07-01 NOTE — ED Provider Notes (Signed)
Patient placed in Quick Look pathway, seen and evaluated   Chief Complaint: Abnormal labs  HPI:   Patient was at [facility] today receiving preop lab work.  She is scheduled for hysterectomy secondary to endometrial cancer.  Found to be hyperkalemic with potassium of 7 and elevated creatinine of 3.48.  Notes decreased appetite and has had 3 episodes of nonbloody nonbilious emesis in the past 2 days.  Denies abdominal pain, fevers, chest pain, shortness of breath, numbness, tingling, or weakness.  Sent to the ED for further evaluation.  ROS: Positive for electro light abnormality, nausea, vomiting Negative for fever, chills, abdominal pain, numbness, weakness  Physical Exam:   Gen: No distress  Neuro: Awake and Alert  Skin: Warm    Focused Exam: Lungs clear to auscultation bilaterally.  Heart regular rate and rhythm.  No murmurs rubs or gallops noted.  Abdomen soft and nontender.   Initiation of care has begun. The patient has been counseled on the process, plan, and necessity for staying for the completion/evaluation, and the remainder of the medical screening examination    Renita Papa, PA-C 07/01/18 1400    Dorie Rank, MD 07/02/18 1520

## 2018-07-01 NOTE — Telephone Encounter (Signed)
Encounter complete. 

## 2018-07-01 NOTE — Consult Note (Addendum)
[facility] Nephrology Consultation Note  Requesting MD: ER (Dr. Sedonia Small) Reason for consult: AKI and hyperkalemia  HPI:  Madison Holden is a 72 y.o. female with history of hypertension, gout, type 2 diabetes for 12 years, had perioperative lab today which was abnormal with potassium of 7 therefore sent to ER for further evaluation.  Per patient, she was diagnosed with endometrial cancer recently and supposed to have hysterectomy next Tuesday at [facility].  Today she came for preoperative lab when she was found to have serum potassium level 7, CO2 12, creatinine 3.48.  She was called to go to ER for further evaluation.  No recent lab results available in epic to review.  Patient reported that she had labs with her PCP at [facility].  No known kidney disease.  Patient is taking meloxicam daily. She is also taking colchicine, allopurinol, lisinopril, hydrochlorothiazide.  She reported feeling generalized weak, nausea, decreased oral intake and having dry mouth.  She denied dysuria, urgency, frequency or change in urination.  No diarrhea or constipation. Denied headache, dizziness, chest pain, shortness of breath.  In the ER, the repeat labs showed serum potassium level of 7.2, CO2 12, creatinine 3.56. EKG unremarkable. Insulin and dextrose ordered in ER but hasn't received yet.  Patient's husband was at bedside.   Creatinine, Ser  Date/Time Value Ref Range Status  07/01/2018 02:20 PM 3.56 (H) 0.44 - 1.00 mg/dL Final  07/01/2018 11:50 AM 3.48 (H) 0.44 - 1.00 mg/dL Final  05/02/2009 10:01 AM 0.7 0.4 - 1.2 mg/dL Final     PMHx:   Past Medical History:  Diagnosis Date  . Ambulates with cane    due to gout   . Arthritis    hands  . Chest pain, unspecified 07-01-2018  per pt had 2 day chest pain and sob  6 weeks ago,, per pt no symptoms since   cardiology-- dr Salley Scarlet--  scheduled for stress test and echo 07-03-2018 prior to surgery scheduled 07-08-2018  .  Diarrhea, unspecified    intermittant  . DOE (dyspnea on exertion)   . Endometrial cancer ([place])   . Essential hypertension   . Frequency of urination   . GERD (gastroesophageal reflux disease)   . Gout rheumologist-- michelle young PA ([facility])   07-01-2018 per pt flare up april 2019 , hands and wrists  . Heart murmur   . History of adenomatous polyp of colon   . History of esophageal dilatation    for stricture  . Mild nausea    per pt intermittant , no vomiting  . Mixed dyslipidemia   . Muscle weakness of extremity    bilateral upper and lower extremities due to gout  . Poor appetite    per pt   . Type 2 diabetes mellitus ([place])    followed by pcp  . Urgency of urination   . Urine frequency     Past Surgical History:  Procedure Laterality Date  . BREAST CYST EXCISION Right 1974   benign per pt  . CATARACT EXTRACTION W/ INTRAOCULAR LENS  IMPLANT, BILATERAL  2014;  2017  . MOUTH SURGERY  11/2017   Upper quadrants,  per pt peridontist did laser procedure on gums  . TONSILLECTOMY  child    Family Hx:  Family History  Problem Relation Age of Onset  . Heart disease Mother   . Heart failure Mother   . Heart disease Father   . Prostate cancer Father   . Clotting disorder Father   .  Diabetes Maternal Grandfather   . Colon cancer Neg Hx     Social History:  reports that she has never smoked. She has never used smokeless tobacco. She reports that she drank alcohol. She reports that she does not use drugs.  Allergies: No Known Allergies  Medications: Prior to Admission medications   Medication Sig Start Date End Date Taking? Authorizing Provider  allopurinol (ZYLOPRIM) 100 MG tablet Take 100 mg by mouth 2 (two) times daily. For Gout    [provider]  aspirin 325 MG tablet Take 650 mg by mouth daily as needed for moderate pain.    [provider]  colchicine 0.6 MG tablet Take 0.6 mg by mouth 2 (two) times daily. For Gout    [provider]  diphenhydrAMINE (BENADRYL) 25 MG tablet Take 25 mg by mouth at bedtime as needed for sleep.     [provider]  esomeprazole (NEXIUM) 20 MG capsule Take 20 mg by mouth 3 (three) times a week.    [provider]  hydrochlorothiazide (HYDRODIURIL) 25 MG tablet TAKE ONE-HALF TABLET BY MOUTH ONCE DAILY Patient taking differently: Take 12.5 mg by mouth daily with lunch.  09/30/13   Fay Records, MD  HYDROcodone-acetaminophen (NORCO/VICODIN) 5-325 MG tablet Take 1 tablet by mouth every 6 (six) hours as needed for moderate pain.    [provider]  lisinopril (PRINIVIL,ZESTRIL) 40 MG tablet Take 40 mg by mouth daily with lunch.     [provider]  Loperamide HCl (IMODIUM A-D PO) Take by mouth as needed.    [provider]  Magnesium 500 MG TABS Take 500 mg by mouth every other day.    [provider]  Melatonin 3 MG TABS Take 3-6 mg by mouth at bedtime as needed (sleep).     [provider]  meloxicam (MOBIC) 15 MG tablet Take 15 mg by mouth every evening.     [provider]  metFORMIN (GLUCOPHAGE) 1000 MG tablet Take 1,000 mg by mouth 2 (two) times daily with a meal.    [provider]  oxybutynin (DITROPAN) 5 MG tablet Take 5 mg by mouth daily. May take a second 5 mg dose as needed for bladder spasms    [provider]  pioglitazone (ACTOS) 15 MG tablet Take 7.5 mg by mouth daily.    [provider]  Polyethyl Glycol-Propyl Glycol (SYSTANE OP) Place 1 drop into both eyes daily as needed (dry eyes).    [provider]  PRESCRIPTION MEDICATION Per pt rheumotologist has written prescription for nausea today 07-01-2018 , unsure of name , has not pick up from pharmacy yet.  Asked pt to bring drug name dos.    [provider]  verapamil (CALAN-SR) 240 MG CR tablet Take 240 mg by mouth at bedtime.    [provider]  vitamin C (ASCORBIC ACID) 500 MG tablet Take 500 mg  by mouth 4 (four) times a week.    [provider]    I have reviewed the patient's current medications.  Labs:  Results for orders placed or performed during the hospital encounter of 07/01/18 (from the past 48 hour(s))  Basic metabolic panel     Status: Abnormal   Collection Time: 07/01/18  2:20 PM  Result Value Ref Range   Sodium 129 (L) 135 - 145 mmol/L   Potassium 7.2 (HH) 3.5 - 5.1 mmol/L    Comment: NO VISIBLE HEMOLYSIS CRITICAL RESULT CALLED TO, READ BACK BY  AND VERIFIED WITH: C CHERVENKA,RN 1548 07/01/2018 WBOND    Chloride 101 98 - 111 mmol/L   CO2 12 (L) 22 - 32 mmol/L   Glucose, Bld 83 70 - 99 mg/dL   BUN 65 (H) 8 - 23 mg/dL   Creatinine, Ser 3.56 (H) 0.44 - 1.00 mg/dL   Calcium 10.1 8.9 - 10.3 mg/dL   GFR calc non Af Amer 12 (L) >60 mL/min   GFR calc Af Amer 14 (L) >60 mL/min    Comment: (NOTE) The eGFR has been calculated using the CKD EPI equation. This calculation has not been validated in all clinical situations. eGFR's persistently <60 mL/min signify possible Chronic Kidney Disease.    Anion gap 16 (H) 5 - 15    Comment: Performed at [facility], [place] [address], [place], [place] [address]  CBC with Differential     Status: Abnormal   Collection Time: 07/01/18  2:20 PM  Result Value Ref Range   WBC 8.1 4.0 - 10.5 K/uL   RBC 3.61 (L) 3.87 - 5.11 MIL/uL   Hemoglobin 9.8 (L) 12.0 - 15.0 g/dL   HCT 33.1 (L) 36.0 - 46.0 %   MCV 91.7 78.0 - 100.0 fL   MCH 27.1 26.0 - 34.0 pg   MCHC 29.6 (L) 30.0 - 36.0 g/dL   RDW 20.9 (H) 11.5 - 15.5 %   Platelets 497 (H) 150 - 400 K/uL   Neutrophils Relative % 81 %   Neutro Abs 6.6 1.7 - 7.7 K/uL   Lymphocytes Relative 11 %   Lymphs Abs 0.9 0.7 - 4.0 K/uL   Monocytes Relative 6 %   Monocytes Absolute 0.5 0.1 - 1.0 K/uL   Eosinophils Relative 1 %   Eosinophils Absolute 0.1 0.0 - 0.7 K/uL   Basophils Relative 1 %   Basophils Absolute 0.0 0.0 - 0.1 K/uL   Immature Granulocytes 0 %   Abs Immature  Granulocytes 0.0 0.0 - 0.1 K/uL    Comment: Performed at [facility], [address], [place], [place] [address]  Magnesium     Status: None   Collection Time: 07/01/18  2:20 PM  Result Value Ref Range   Magnesium 1.7 1.7 - 2.4 mg/dL    Comment: Performed at [facility], [place] [address], [place], [place] [address]  Phosphorus     Status: Abnormal   Collection Time: 07/01/18  2:20 PM  Result Value Ref Range   Phosphorus 5.9 (H) 2.5 - 4.6 mg/dL    Comment: Performed at [facility] [address] [place], [place] [address]  Type and screen [place]     Status: None   Collection Time: 07/01/18  2:20 PM  Result Value Ref Range   ABO/RH(D) B POS    Antibody Screen NEG    Sample Expiration      07/04/2018 Performed at [facility], [place] [address] [place], [place] [address]   ABO/Rh     Status: None   Collection Time: 07/01/18  2:20 PM  Result Value Ref Range   ABO/RH(D)      B POS Performed at [facility] [address] [place], [place] [address]      ROS:  Pertinent items noted in HPI and remainder of comprehensive ROS otherwise negative.  Physical Exam: Vitals:   07/01/18 1356 07/01/18 1523  BP: (!) 105/54 (!) 112/56  Pulse: 92 90  Resp: 18 16  Temp:  98 F (36.7 C)   SpO2: 97% 98%     General exam: Appears calm and comfortable, dry MM Respiratory system: Clear to auscultation. Respiratory effort normal. No wheezing or crackle Cardiovascular system: S1 & S2 heard, RRR.  No pedal edema. Gastrointestinal system: Abdomen is nondistended, soft and nontender. Normal bowel sounds heard. Central nervous system: Alert and oriented. No focal neurological deficits. Extremities: Symmetric 5 x 5 power. Skin: No rashes, lesions or ulcers Psychiatry: Judgement and insight appear normal. Mood & affect appropriate.   Assessment/Plan:  # Acute Kidney Injury likely due to combination of NSAIDs/lisinopril/HCTZ and dehydration.  Patient was taking meloxicam, lisinopril, colchicine bid, HCTZ at home. Dry MM on exam. -check UA, urine lytes -check bladder scan and US renal to r/o obstruction -start sodium bicarbonate IVF, maintain hydration. -hold nephrotoxins, strict I/O -monitor BMP and urine output.   # Severe hyperkalemia due to lisinopril/NSAIDs and with AKI; -EKG unchanged and patient is asymptomatic. I ordered V[place] while waiting for IV access.  -agree with medical rx including dextrose, insulin as ordered by ER. -Sod bicarbonate IVF should help with cellular shifting.  -repeat BMP in the evening.   # HTN: BP acceptable. Recommend to hold lisinopril and diuretics. Monitor BP.   I have discussed with the patient and her husband. Also discussed with Dr. Lorin Mercy from [facility].  We will follow with you. Thank you for the consult.     Abisola Carrero Tanna Furry 07/01/2018, 5:16 PM  [facility].

## 2018-07-01 NOTE — ED Notes (Signed)
Attempted report x1. 

## 2018-07-02 ENCOUNTER — Telehealth: Payer: Self-pay | Admitting: *Deleted

## 2018-07-02 ENCOUNTER — Ambulatory Visit (HOSPITAL_COMMUNITY): Payer: Medicare Other

## 2018-07-02 DIAGNOSIS — C541 Malignant neoplasm of endometrium: Secondary | ICD-10-CM

## 2018-07-02 DIAGNOSIS — I10 Essential (primary) hypertension: Secondary | ICD-10-CM

## 2018-07-02 DIAGNOSIS — N179 Acute kidney failure, unspecified: Secondary | ICD-10-CM

## 2018-07-02 LAB — BASIC METABOLIC PANEL
ANION GAP: 13 (ref 5–15)
ANION GAP: 12 (ref 5–15)
BUN: 63 mg/dL — ABNORMAL HIGH (ref 8–23)
BUN: 52 mg/dL — AB (ref 8–23)
CALCIUM: 9.5 mg/dL (ref 8.9–10.3)
CHLORIDE: 101 mmol/L (ref 98–111)
CO2: 13 mmol/L — ABNORMAL LOW (ref 22–32)
CO2: 19 mmol/L — ABNORMAL LOW (ref 22–32)
Calcium: 9.1 mg/dL (ref 8.9–10.3)
Chloride: 102 mmol/L (ref 98–111)
Creatinine, Ser: 3.1 mg/dL — ABNORMAL HIGH (ref 0.44–1.00)
Creatinine, Ser: 2.26 mg/dL — ABNORMAL HIGH (ref 0.44–1.00)
GFR calc non Af Amer: 21 mL/min — ABNORMAL LOW (ref >60)
GFR, EST AFRICAN AMERICAN: 17 mL/min — AB (ref >60)
GFR, EST AFRICAN AMERICAN: 24 mL/min — AB (ref >60)
GFR, EST NON AFRICAN AMERICAN: 14 mL/min — AB (ref >60)
GLUCOSE: 90 mg/dL (ref 70–99)
Glucose, Bld: 100 mg/dL — ABNORMAL HIGH (ref 70–99)
POTASSIUM: 5 mmol/L (ref 3.5–5.1)
Potassium: 6.2 mmol/L — ABNORMAL HIGH (ref 3.5–5.1)
SODIUM: 128 mmol/L — AB (ref 135–145)
SODIUM: 132 mmol/L — AB (ref 135–145)

## 2018-07-02 LAB — CBC
HEMATOCRIT: 30.3 % — AB (ref 36.0–46.0)
HEMOGLOBIN: 9 g/dL — AB (ref 12.0–15.0)
MCH: 26.9 pg (ref 26.0–34.0)
MCHC: 29.7 g/dL — AB (ref 30.0–36.0)
MCV: 90.7 fL (ref 78.0–100.0)
Platelets: 450 10*3/uL — ABNORMAL HIGH (ref 150–400)
RBC: 3.34 MIL/uL — AB (ref 3.87–5.11)
RDW: 20.5 % — ABNORMAL HIGH (ref 11.5–15.5)
WBC: 5.8 10*3/uL (ref 4.0–10.5)

## 2018-07-02 LAB — FERRITIN: FERRITIN: 138 ng/mL (ref 11–307)

## 2018-07-02 LAB — NA AND K (SODIUM & POTASSIUM), RAND UR
POTASSIUM UR: 13 mmol/L
Sodium, Ur: 40 mmol/L

## 2018-07-02 LAB — GLUCOSE, CAPILLARY
GLUCOSE-CAPILLARY: 89 mg/dL (ref 70–99)
Glucose-Capillary: 97 mg/dL (ref 70–99)
Glucose-Capillary: 87 mg/dL (ref 70–99)
Glucose-Capillary: 97 mg/dL (ref 70–99)

## 2018-07-02 LAB — IRON AND TIBC
Iron: 53 ug/dL (ref 28–170)
SATURATION RATIOS: 20 % (ref 10.4–31.8)
TIBC: 269 ug/dL (ref 250–450)
UIBC: 216 ug/dL

## 2018-07-02 LAB — HIV ANTIBODY (ROUTINE TESTING W REFLEX): HIV SCREEN 4TH GENERATION: NONREACTIVE

## 2018-07-02 LAB — ABO/RH: ABO/RH(D): B POS

## 2018-07-02 LAB — HEMOGLOBIN A1C
HEMOGLOBIN A1C: 5.4 % (ref 4.8–5.6)
Mean Plasma Glucose: 108 mg/dL

## 2018-07-02 MED ORDER — SODIUM ZIRCONIUM CYCLOSILICATE 5 G PO PACK
5.0000 g | PACK | Freq: Three times a day (TID) | ORAL | Status: AC
  Administered 2018-07-02 (×2): 5 g via ORAL
  Filled 2018-07-02 (×3): qty 1

## 2018-07-02 NOTE — Telephone Encounter (Signed)
I called [facility] to speak with Dr. Julien Nordmann nurse Caryl Pina.  I told Caryl Pina that our office sent Mrs. Rossner to the emergency room on yesterday after receiving critical lab values from the pre-operative time.  I ask Caryl Pina to please make Dr. Geraldo Pitter aware that the patient is hospitalized at [facility].  I also told her that Mrs. Banka has the cardiac workup scheduled for tomorrow and Friday, September 12th and 13th.  Caryl Pina states that she will cancel this appointment and to have the patient to call back to their office when she is ready to reschedule.

## 2018-07-02 NOTE — Progress Notes (Signed)
Patient resting in bed alert, oriented x3.  States she feels better this am.  Tolerating diet with no nausea or emesis.  Having loose BMs (4 through the night).  She is concerned about cancelled her echo and other cardiac testing for pre-operative evaluation.  Advised our office would handle that for her.  Advised that once she has recovered, we would arrange for her to meet with the GYN Oncologist in the office again before rescheduling her surgery for endometrial cancer.  Patient agreeable with the plan.  No concerns voiced.  GYN ONC will continue to follow.

## 2018-07-02 NOTE — Progress Notes (Addendum)
Subjective:  Madison Holden was seen resting in her bed this morning. She stated that she had some abdominal pain last night, but none this morning. She denies any shortness of breath or chest pain.   Objective: Vital signs in last 24 hours: Temp:  [97.4 F (36.3 C)-98.1 F (36.7 C)] 97.4 F (36.3 C) (09/11 0456) Pulse Rate:  [82-93] 84 (09/11 0456) Resp:  [12-20] 19 (09/11 0456) BP: (74-118)/(33-78) 98/62 (09/11 0510) SpO2:  [94 %-99 %] 94 % (09/11 0456) Weight:  [88.6 kg] 88.6 kg (09/10 2055) Weight change:   Intake/Output from previous day: 09/10 0701 - 09/11 0700 In: 500 [I.V.:500] Out: 300 [Urine:300] Intake/Output this shift: Total I/O In: 120 [P.O.:120] Out: -   Physical Exam  Constitutional: She appears well-developed and well-nourished. No distress.  HENT:  Head: Normocephalic and atraumatic.  Eyes: Conjunctivae are normal.  Cardiovascular: Normal rate, regular rhythm and normal heart sounds.  Pulmonary/Chest: Effort normal and breath sounds normal. No respiratory distress. She has no wheezes.  Abdominal: Soft. Bowel sounds are normal. She exhibits no distension. There is no tenderness.  Musculoskeletal: She exhibits no edema.  Neurological: She is alert.  Skin: She is not diaphoretic. No erythema.  Psychiatric: She has a normal mood and affect. Her behavior is normal. Judgment and thought content normal.   Lab Results: Recent Labs    07/01/18 1420 07/02/18 0207  WBC 8.1 5.8  HGB 9.8* 9.0*  HCT 33.1* 30.3*  PLT 497* 450*   BMET:  Recent Labs    07/01/18 2106 07/02/18 0207  NA 128* 128*  K 6.2* 6.2*  CL 105 102  CO2 13* 13*  GLUCOSE 99 90  BUN 62* 63*  CREATININE 3.15* 3.10*  CALCIUM 9.6 9.5   No results for input(s): PTH in the last 72 hours. Iron Studies: No results for input(s): IRON, TIBC, TRANSFERRIN, FERRITIN in the last 72 hours. Studies/Results: US Renal  Result Date: 07/01/2018 CLINICAL DATA:  Acute kidney injury EXAM: RENAL / URINARY  TRACT ULTRASOUND COMPLETE COMPARISON:  None. FINDINGS: Right Kidney: Length: 10.8 cm. Echogenicity within normal limits. No mass or hydronephrosis visualized. Left Kidney: Length: 11.5 cm. Echogenicity within normal limits. No mass or hydronephrosis visualized. Bladder: Appears normal for degree of bladder distention. IMPRESSION: Negative renal ultrasound. Electronically Signed   By: Donavan Foil M.D.   On: 07/01/2018 18:58    I have reviewed the patient's current medications.  Assessment/Plan:  Acute renal failure The patient presented with creatinine of 3.48 with no known prior kidney problems, baseline creatinine is not known.  Urinalysis did not note any protein, specific gravity normal, with presence of hyaline casts and some non squamous epithelium. Renal ultrasound does not show any signs of obstruction and there was no hydronephrosis noted.   The renal failure is likely hemodynamically mediated/pre-renal secondary to her nausea, vomiting, diarrhea and use of several nephrotoxic agents (meloxicam, lasix, hctz).   Patient's blood pressure has ranged between 90-110/60-70s today. She should be continued on bicarb drip till potassium stabilizes. Avoid nephrotoxic agents. Hold lisinopril, colchicine, hctz, meloxicam.  Hyperkalemia  Patient was admitted with k=7 without significant ekg abnormalities. The potassium is now down to 6.2 today (9/11) after the patient was placed on valtessa, insulin, calcium gluconate, patiromer, and kayexalate. Will recheck bmp at 12:00pm today for improvement.  The patient's hyperkalemia is likely due to being on a combination of lisinopril, NSAIDS. Continue sodium bicarbonate 163meq drip at 157ml/hr, cardiac monitoring.   Normocytic anemia  Patient with low  hemoglobin of 9.0 and elevated rdw=20.4 Calculated mentzer index=22 is greater than 13 making iron deficiency more likely. She likely has blood loss secondary from uterine bleeding. Will get iron studies.    Gout Uric acid of 7.3. Hold colchicine. Can continue allopurinolat low dose and pain control (without nsaids).   LOS: 1 day   Lurena Naeve 07/02/2018,10:00 AM

## 2018-07-02 NOTE — Progress Notes (Signed)
PROGRESS NOTE    Unnamed Hino [facility]  WNI:[id number] DOB: [birth year] DOA: 07/01/2018 PCP: Orpah Melter, MD    Brief Narrative:  72 y.o. female with medical history significant of DM; HLD; GERD; HTN; and endometrial cancer presenting with abnormal labs.  About 2-3 weeks ago, her OB/Gyn saw abnormal labs and recommended uterus scraping.  She was placed under anesthesia and diagnosed endometrial cancer (likely confined); she will need a total hysterectomy but hopefully no chemo/rads.  Prior to that, she had her first flare-up of gout in April, diagnosed in May.  She has tried a variety of drugs (Mobic, Allopurinol, Colchicine, prednisone for about 6 weeks).  Within the last 10 days, she is taking hydrocodone for the pain in addition to the other gout medications.  At pre-op today for the hysterectomy, she as found to have acute renal failure with hyperkalemia.  She has been feeling okay except for the gout, with flare-ups in her hands, shoulders, and other joints.  She has been feeling like she was going downhill with all these medications and she has been having a hard time walking.  She can't eat/drink.  +n/v x the last week.  + diarrhea x 2 weeks, once or twice a day.   She is scheduled for TAH on 9/17.  She did have CP/SOB a few weeks ago and she is scheduled to a pre-op stress test over the next few days.  ED Course:  Severe hyperkalemia.  H/o endometrial CA, getting pre-op labs for hysterectomy.  K+ 7.0, 7.2  Nephrology consulted  Assessment & Plan:   Principal Problem:   Acute renal failure (ARF) ([place]) Active Problems:   Essential hypertension   Diabetes mellitus ([place])   Endometrial carcinoma ([place])   Gout   Hyperkalemia  Acute renal failure -Baseline creatinine is 0.7 (2010) -Cr down to 2.26 -Likely due to prerenal failure secondary to dehydration and continuation of ACEI, diruetics, NSAIDs, and other nephrotoxic medications. -Nephrotoxins on hold -Continue with sodium bicarb  infusion -US-renal reviewed. Normal -Nephrology consulted -Repeat bmet in AM  Hyperkalemia -Patient with acute/subacute hyperkalemia which appears to be resulting from acute renal failureI due to volume deficiency in setting of multiple nephrotoxic medications -Improved  Endometrial carcinoma -Patient was incidentally found to have lab abnormalities at pre-op evaluation -She is also scheduled to have pre-op stress test and echo later this week; these will likely need to be delayed (depending on how long she is hospitalized) -Pt to follow up with Surgery  DM -Hgb A1c of 5.6 -hold Glucophage and Actos while in hospital -Continue with SSI coverage  HTN -Hold home medications including HCTZ and Lisinopril -Continue Verapamil as tolerated -Could add prn iv hydralazine if needed  Gout -Patient is thought to have gout, although she has chronic pain in her hands primarily associated with her issue -Additionally, she has been given Allopurinol; colchicine; NSAIDs; and a recent course of prednisone - all without resolution -Will likely to need outpatient rheumatology follow-up  DVT prophylaxis: Lovenox subQ Code Status: Full Family Communication: Pt in room, family at bedside Disposition Plan: Uncertain at this time  Consultants:   Nephrology  Procedures:     Antimicrobials: Anti-infectives (From admission, onward)   None       Subjective: Without complaints at this time  Objective: Vitals:   07/02/18 0456 07/02/18 0510 07/02/18 1143 07/02/18 1709  BP: (!) 74/33 98/62 111/63 (!) 96/49  Pulse: 84  90 92  Resp: 19  20 (!) 8  Temp: (!) 97.4 F (  36.3 C)  97.7 F (36.5 C) 98.6 F (37 C)  TempSrc: Oral  Oral Oral  SpO2: 94%  93% 97%  Weight:      Height:        Intake/Output Summary (Last 24 hours) at 07/02/2018 1749 Last data filed at 07/02/2018 1429 Gross per 24 hour  Intake 860 ml  Output 300 ml  Net 560 ml   Filed Weights   07/01/18 2055  Weight:  88.6 kg    Examination:  General exam: Appears calm and comfortable  Respiratory system: Clear to auscultation. Respiratory effort normal. Cardiovascular system: S1 & S2 heard, RRR Gastrointestinal system: Abdomen is nondistended, soft and nontender. No organomegaly or masses felt. Normal bowel sounds heard. Central nervous system: Alert and oriented. No focal neurological deficits. Extremities: Symmetric 5 x 5 power. Skin: No rashes, lesions  Psychiatry: Judgement and insight appear normal. Mood & affect appropriate.   Data Reviewed: I have personally reviewed following labs and imaging studies  CBC: Recent Labs  Lab 07/01/18 1150 07/01/18 1420 07/02/18 0207  WBC 9.5 8.1 5.8  NEUTROABS  --  6.6  --   HGB 10.5* 9.8* 9.0*  HCT 34.0* 33.1* 30.3*  MCV 87.0 91.7 90.7  PLT 548* 497* 053*   Basic Metabolic Panel: Recent Labs  Lab 07/01/18 1150 07/01/18 1420 07/01/18 2106 07/02/18 0207 07/02/18 1149  NA 133* 129* 128* 128* 132*  K 7.0* 7.2* 6.2* 6.2* 5.0  CL 106 101 105 102 101  CO2 12* 12* 13* 13* 19*  GLUCOSE 88 83 99 90 100*  BUN 70* 65* 62* 63* 52*  CREATININE 3.48* 3.56* 3.15* 3.10* 2.26*  CALCIUM 10.4* 10.1 9.6 9.5 9.1  MG  --  1.7  --   --   --   PHOS  --  5.9* 5.3*  --   --    GFR: Estimated Creatinine Clearance: 22.3 mL/min (A) (by C-G formula based on SCr of 2.26 mg/dL (H)). Liver Function Tests: Recent Labs  Lab 07/01/18 1150 07/01/18 2106  AST 34  --   ALT 22  --   ALKPHOS 133*  --   BILITOT 1.1  --   PROT 8.4*  --   ALBUMIN 4.3 3.6   No results for input(s): LIPASE, AMYLASE in the last 168 hours. No results for input(s): AMMONIA in the last 168 hours. Coagulation Profile: No results for input(s): INR, PROTIME in the last 168 hours. Cardiac Enzymes: Recent Labs  Lab 07/01/18 2106  CKTOTAL 51   BNP (last 3 results) No results for input(s): PROBNP in the last 8760 hours. HbA1C: Recent Labs    07/01/18 1150 07/01/18 2106  HGBA1C 5.4 5.6    CBG: Recent Labs  Lab 07/01/18 1048 07/01/18 1729 07/02/18 0823 07/02/18 1143 07/02/18 1708  GLUCAP 75 74 89 97 87   Lipid Profile: No results for input(s): CHOL, HDL, LDLCALC, TRIG, CHOLHDL, LDLDIRECT in the last 72 hours. Thyroid Function Tests: No results for input(s): TSH, T4TOTAL, FREET4, T3FREE, THYROIDAB in the last 72 hours. Anemia Panel: Recent Labs    07/02/18 1149  FERRITIN 138  TIBC 269  IRON 53   Sepsis Labs: No results for input(s): PROCALCITON, LATICACIDVEN in the last 168 hours.  No results found for this or any previous visit (from the past 240 hour(s)).   Radiology Studies: US Renal  Result Date: 07/01/2018 CLINICAL DATA:  Acute kidney injury EXAM: RENAL / URINARY TRACT ULTRASOUND COMPLETE COMPARISON:  None. FINDINGS: Right Kidney: Length: 10.8 cm.  Echogenicity within normal limits. No mass or hydronephrosis visualized. Left Kidney: Length: 11.5 cm. Echogenicity within normal limits. No mass or hydronephrosis visualized. Bladder: Appears normal for degree of bladder distention. IMPRESSION: Negative renal ultrasound. Electronically Signed   By: Donavan Foil M.D.   On: 07/01/2018 18:58    Scheduled Meds: . allopurinol  100 mg Oral BID  . enoxaparin (LOVENOX) injection  30 mg Subcutaneous Q24H  . insulin aspart  0-9 Units Subcutaneous TID WC  . pantoprazole  40 mg Oral Daily  . sodium chloride flush  3 mL Intravenous Q12H  . sodium zirconium cyclosilicate  5 g Oral TID   Continuous Infusions: .  sodium bicarbonate (isotonic) infusion in sterile water 125 mL/hr at 07/02/18 1600     LOS: 1 day   Marylu Lund, MD [facility] Pager On Amion  If 7PM-7AM, please contact night-coverage 07/02/2018, 5:49 PM

## 2018-07-03 ENCOUNTER — Telehealth: Payer: Self-pay | Admitting: Oncology

## 2018-07-03 ENCOUNTER — Inpatient Hospital Stay (HOSPITAL_COMMUNITY): Payer: Medicare Other

## 2018-07-03 ENCOUNTER — Inpatient Hospital Stay (HOSPITAL_COMMUNITY): Admission: RE | Admit: 2018-07-03 | Payer: Medicare Other | Source: Ambulatory Visit

## 2018-07-03 LAB — RENAL FUNCTION PANEL
ALBUMIN: 3.2 g/dL — AB (ref 3.5–5.0)
ANION GAP: 14 (ref 5–15)
BUN: 34 mg/dL — ABNORMAL HIGH (ref 8–23)
CALCIUM: 9 mg/dL (ref 8.9–10.3)
CO2: 28 mmol/L (ref 22–32)
Chloride: 93 mmol/L — ABNORMAL LOW (ref 98–111)
Creatinine, Ser: 1.45 mg/dL — ABNORMAL HIGH (ref 0.44–1.00)
GFR calc Af Amer: 42 mL/min — ABNORMAL LOW (ref >60)
GFR calc non Af Amer: 36 mL/min — ABNORMAL LOW (ref >60)
GLUCOSE: 96 mg/dL (ref 70–99)
PHOSPHORUS: 3 mg/dL (ref 2.5–4.6)
POTASSIUM: 4.3 mmol/L (ref 3.5–5.1)
SODIUM: 135 mmol/L (ref 135–145)

## 2018-07-03 LAB — GLUCOSE, CAPILLARY
Glucose-Capillary: 84 mg/dL (ref 70–99)
Glucose-Capillary: 97 mg/dL (ref 70–99)
Glucose-Capillary: 92 mg/dL (ref 70–99)
Glucose-Capillary: 91 mg/dL (ref 70–99)

## 2018-07-03 NOTE — Care Management Note (Signed)
Case Management Note  Patient Details  Name: Madison Holden MRN: [id number] Date of Birth: May 05, 1946  Subjective/Objective:  PMH of DM, HLD, GERD, HTN, and endometrial cancer presenting with acute renal failure with hyperkalemia.From home with husband.              Cherlyn Labella (Spouse)     [phone]      PCP: Orpah Melter  Action/Plan: Transition to home. Pt to f/u with outpatient PT. MD to provide pt with hard script for outpatient PT. Pt lives in [place] and will find rehab.center there. Rolator will be delivered to pt's bedside prior to d/c. Referral made with [facility]. Pt has transportation to home.  Expected Discharge Date:   07/03/2018           Expected Discharge Plan:  Home/Self Care  In-House Referral:     Discharge planning Services  CM Consult  Post Acute Care Choice:    Choice offered to:  Patient  DME Arranged:  Gilford Rile rolling with seat DME Agency:  [place]:   N/A HH Agency:   N/A  Status of Service:  Completed, signed off  If discussed at [place] of Stay Meetings, dates discussed:    Additional Comments:  Sharin Mons, RN 07/03/2018, 3:30 PM

## 2018-07-03 NOTE — Progress Notes (Signed)
Patient alert, oriented, in bed getting ready to go to restroom then eat breakfast.  States she feels better compared to yesterday but has new abdominal bloating and lower pelvic cramping.  She states she is passing flatus and her BM is becoming more solid.  She states she was given several doses of a powder medication yesterday and she is not sure if that what her abdominal symptoms are related to.  No vaginal bleeding reported.  Voiding without difficulty.    Describes the abdominal pain as intermittent cramping.  The bloating she noticed starting yesterday evening.  She states she did have an episode of emesis yesterday as well.  Abdomen soft, pain not reported with palpation of the abdomen.  Slightly distended and obese.    Advised her to continue to monitor her abdominal symptoms and report worsening in symptoms.  If abdominal symptoms persist, abdominal imaging could be considered to evaluate esp with known malignancy (grade 1 endo ca with lower likelihood of metastasis but still could be possibility). GYN ONC will continue to follow.

## 2018-07-03 NOTE — Progress Notes (Signed)
PT Cancellation Note  Patient Details Name: Madison Holden MRN: [id number] DOB: 05/12/1946   Cancelled Treatment:    Reason Eval/Treat Not Completed: Other (comment). Patient declining due to recent nausea/vomiting but willing to participate in evaluation later. Will re-attempt.  Ellamae Sia, PT, DPT Acute Rehabilitation Services Pager [phone] Office [phone]     Willy Eddy 07/03/2018, 11:37 AM

## 2018-07-03 NOTE — Progress Notes (Addendum)
Subjective: Madison Holden was seen resting comfortably in her bed with her husband at bedside this morning. She states that she vomited once this morning, without any nausea. She also has some abdominal cramping in her suprapubic region.  Objective: Vital signs in last 24 hours: Temp:  [97.7 F (36.5 C)-98.7 F (37.1 C)] 98.5 F (36.9 C) (09/12 0518) Pulse Rate:  [90-98] 98 (09/12 0518) Resp:  [8-20] 18 (09/12 0518) BP: (96-111)/(49-91) 103/91 (09/12 0518) SpO2:  [93 %-97 %] 93 % (09/12 0518) Weight change:   Intake/Output from previous day: No intake/output data recorded. Intake/Output this shift: No intake/output data recorded.  Physical Exam  Constitutional: She appears well-developed and well-nourished. No distress.  HENT:  Head: Normocephalic and atraumatic.  Eyes: Conjunctivae are normal.  Cardiovascular: Normal rate, regular rhythm and normal heart sounds.  Respiratory: Effort normal and breath sounds normal. No respiratory distress. She has no wheezes.  GI: Soft. Bowel sounds are normal. She exhibits no distension. There is tenderness (suprapubic region to palpation).  Musculoskeletal: She exhibits no edema.  Neurological: She is alert.  Skin: She is not diaphoretic. No erythema.  Psychiatric: She has a normal mood and affect. Her behavior is normal. Judgment and thought content normal.     Lab Results: Recent Labs    07/01/18 1420 07/02/18 0207  WBC 8.1 5.8  HGB 9.8* 9.0*  HCT 33.1* 30.3*  PLT 497* 450*   BMET:  Recent Labs    07/02/18 1149 07/03/18 0612  NA 132* 135  K 5.0 4.3  CL 101 93*  CO2 19* 28  GLUCOSE 100* 96  BUN 52* 34*  CREATININE 2.26* 1.45*  CALCIUM 9.1 9.0   No results for input(s): PTH in the last 72 hours. Iron Studies:  Recent Labs    07/02/18 1149  IRON 53  TIBC 269  FERRITIN 138   Studies/Results: US Renal  Result Date: 07/01/2018 CLINICAL DATA:  Acute kidney injury EXAM: RENAL / URINARY TRACT ULTRASOUND COMPLETE  COMPARISON:  None. FINDINGS: Right Kidney: Length: 10.8 cm. Echogenicity within normal limits. No mass or hydronephrosis visualized. Left Kidney: Length: 11.5 cm. Echogenicity within normal limits. No mass or hydronephrosis visualized. Bladder: Appears normal for degree of bladder distention. IMPRESSION: Negative renal ultrasound. Electronically Signed   By: Donavan Foil M.D.   On: 07/01/2018 18:58    I have reviewed the patient's current medications.  Assessment/Plan:  ATN induced Acute kidney injury 2/2 use of nephrotoxic agents and hemodynamically mediated  The patient has put out a total of 1.2L over the past 24 hrs, weight has not been collected.  Patient mentioned to me that she was able to get her baseline creatinine level from a prior lab test and it was noted to be 1.2. The patient's creatinine has improved today to 1.45 from prior 2.26 yesterday.  Recommend stopping hctz as an outpatient as it can cause hyperuricemia which can worsen her gout, is on beer's list and not safe in elderly patients, can cause siadh and renal problems. Can continue lisinopril due to cardio and renoprotective effect in setting of diabetes. Patient should continue to avoid nsaids and colchicine. Nephrology to sign off.   Hyperkalemia  Improved, k=4.3 this morning from prior 5.0. Pending urine osmolality, na and k. Can stop sodium bicarb drip.  Hypotension The patient's blood pressure has ranged 90-110/60-90 over the past 24 hrs.  Normocytic anemia Iron studies: ferritin=138,fe=53, saturation=20, tibc=269. Patient likely has iron deficiency. CBC not drawn today.  Gout The patient may  benefit from further rheumatologic testing as she has symmetric pip, dip, mcp, shoulder, and knee pain which is an unusual presentation of gout. Can continue allopurinol 100mg  bid.  LOS: 0 days   [name] 07/03/2018,8:54 AM

## 2018-07-03 NOTE — Telephone Encounter (Signed)
Called [facility] and spoke to Dr. Julien Nordmann nurse, Caryl Pina. Advised her that patient will be discharged today and needs to reschedule the stress test.  Caryl Pina asked that patient call to reschedule when she is discharged.  Called patient's husband and advised him to have Lezli call when discharged to reschedule the stress test.  He said she is waiting for PT to sign off so that she can go home.  He also mentioned that she is having abdominal pain and threw up her breakfast today.  She did take a nausea pill this morning but vomited right after taking it.  He is wondering if she needs a scan.  Advised him that Joylene John, NP will be advised of her symptoms.

## 2018-07-03 NOTE — Progress Notes (Addendum)
PROGRESS NOTE    Madison Holden [facility]  DJS:[id number] DOB: 09-Jun-1946 DOA: 07/01/2018 PCP: Orpah Melter, MD    Brief Narrative:  72 y.o. female with medical history significant of DM; HLD; GERD; HTN; and endometrial cancer presenting with abnormal labs.  About 2-3 weeks ago, her OB/Gyn saw abnormal labs and recommended uterus scraping.  She was placed under anesthesia and diagnosed endometrial cancer (likely confined); she will need a total hysterectomy but hopefully no chemo/rads.  Prior to that, she had her first flare-up of gout in April, diagnosed in May.  She has tried a variety of drugs (Mobic, Allopurinol, Colchicine, prednisone for about 6 weeks).  Within the last 10 days, she is taking hydrocodone for the pain in addition to the other gout medications.  At pre-op today for the hysterectomy, she as found to have acute renal failure with hyperkalemia.  She has been feeling okay except for the gout, with flare-ups in her hands, shoulders, and other joints.  She has been feeling like she was going downhill with all these medications and she has been having a hard time walking.  She can't eat/drink.  +n/v x the last week.  + diarrhea x 2 weeks, once or twice a day.   She is scheduled for TAH on 9/17.  She did have CP/SOB a few weeks ago and she is scheduled to a pre-op stress test over the next few days.  ED Course:  Severe hyperkalemia.  H/o endometrial CA, getting pre-op labs for hysterectomy.  K+ 7.0, 7.2  Nephrology consulted  Assessment & Plan:   Principal Problem:   Acute renal failure (ARF) ([place]) Active Problems:   Essential hypertension   Diabetes mellitus ([place])   Endometrial carcinoma ([place])   Gout   Hyperkalemia  Acute renal failure -Baseline creatinine is 0.7 (2010) -Likely due to prerenal failure secondary to dehydration and continuation of ACEI, diruetics, NSAIDs, and other nephrotoxic medications. -Nephrotoxins on hold -Completed course of sodium bicarb  infusion -US-renal reviewed. Normal -Nephrology had been following -Renal function much improved and nephrology has since signed off  Hyperkalemia -Patient with acute/subacute hyperkalemia which appears to be resulting from acute renal failureI due to volume deficiency in setting of multiple nephrotoxic medications -Now much improved  Endometrial carcinoma -Patient was incidentally found to have lab abnormalities at pre-op evaluation -She is also scheduled to have pre-op stress test and echo later this week; these will likely need to be delayed (depending on how long she is hospitalized) -Patient to follow up with Surgery as outpatient. -Increased bloating. See below  DM -Hgb A1c of 5.6 -hold Glucophage and Actos while in hospital -Will continue with SSI as needed  HTN -Hold home medications including HCTZ and Lisinopril -Continue Verapamil as tolerated -Could add prn iv hydralazine if needed -BP stable at this time  Gout -Patient is thought to have gout, although she has chronic pain in her hands primarily associated with her issue -Additionally, she has been given Allopurinol; colchicine; NSAIDs; and a recent course of prednisone - all without resolution -Will likely to need outpatient rheumatology follow-up -Presently stable. Outpatient PT recommended by PT  Nauseea/Vomiting -New finding this AM -Patient reports increased "bloating" -Reports passing flatus -Pt also reports lower quadrant tenderness -Given concerns of possible endomedrial carcinoma, have ordered CT abd/pelvis without contrast to r/o obstruction or mass effect needing   DVT prophylaxis: Lovenox subQ Code Status: Full Family Communication: Pt in room, family at bedside Disposition Plan: Uncertain at this time  Consultants:  Nephrology  Procedures:     Antimicrobials: Anti-infectives (From admission, onward)   None      Subjective: Complains of nausea and vomiting this  AM  Objective: Vitals:   07/03/18 0518 07/03/18 0928 07/03/18 0930 07/03/18 1730  BP: (!) 103/91 (!) 121/49 (!) 115/59 127/82  Pulse: 98 (!) 110 (!) 112 87  Resp: 18 18  18   Temp: 98.5 F (36.9 C) 98.1 F (36.7 C)  98.9 F (37.2 C)  TempSrc:  Oral  Oral  SpO2: 93% 91% 91% 93%  Weight:      Height:        Intake/Output Summary (Last 24 hours) at 07/03/2018 1744 Last data filed at 07/03/2018 1338 Gross per 24 hour  Intake 1187.2 ml  Output 1750 ml  Net -562.8 ml   Filed Weights   07/01/18 2055  Weight: 88.6 kg    Examination: General exam: Awake, laying in bed, in nad Respiratory system: Normal respiratory effort, no wheezing Cardiovascular system: regular rate, s1, s2 Gastrointestinal system: Soft, nondistended, positive BS  Central nervous system: CN2-12 grossly intact, strength intact Extremities: Perfused, no clubbing Skin: Normal skin turgor, no notable skin lesions seen Psychiatry: Mood normal // no visual hallucinations   Data Reviewed: I have personally reviewed following labs and imaging studies  CBC: Recent Labs  Lab 07/01/18 1150 07/01/18 1420 07/02/18 0207  WBC 9.5 8.1 5.8  NEUTROABS  --  6.6  --   HGB 10.5* 9.8* 9.0*  HCT 34.0* 33.1* 30.3*  MCV 87.0 91.7 90.7  PLT 548* 497* 127*   Basic Metabolic Panel: Recent Labs  Lab 07/01/18 1420 07/01/18 2106 07/02/18 0207 07/02/18 1149 07/03/18 0612  NA 129* 128* 128* 132* 135  K 7.2* 6.2* 6.2* 5.0 4.3  CL 101 105 102 101 93*  CO2 12* 13* 13* 19* 28  GLUCOSE 83 99 90 100* 96  BUN 65* 62* 63* 52* 34*  CREATININE 3.56* 3.15* 3.10* 2.26* 1.45*  CALCIUM 10.1 9.6 9.5 9.1 9.0  MG 1.7  --   --   --   --   PHOS 5.9* 5.3*  --   --  3.0   GFR: Estimated Creatinine Clearance: 34.7 mL/min (A) (by C-G formula based on SCr of 1.45 mg/dL (H)). Liver Function Tests: Recent Labs  Lab 07/01/18 1150 07/01/18 2106 07/03/18 0612  AST 34  --   --   ALT 22  --   --   ALKPHOS 133*  --   --   BILITOT 1.1  --    --   PROT 8.4*  --   --   ALBUMIN 4.3 3.6 3.2*   No results for input(s): LIPASE, AMYLASE in the last 168 hours. No results for input(s): AMMONIA in the last 168 hours. Coagulation Profile: No results for input(s): INR, PROTIME in the last 168 hours. Cardiac Enzymes: Recent Labs  Lab 07/01/18 2106  CKTOTAL 51   BNP (last 3 results) No results for input(s): PROBNP in the last 8760 hours. HbA1C: Recent Labs    07/01/18 1150 07/01/18 2106  HGBA1C 5.4 5.6   CBG: Recent Labs  Lab 07/02/18 1708 07/02/18 2101 07/03/18 0736 07/03/18 1231 07/03/18 1633  GLUCAP 87 97 84 97 92   Lipid Profile: No results for input(s): CHOL, HDL, LDLCALC, TRIG, CHOLHDL, LDLDIRECT in the last 72 hours. Thyroid Function Tests: No results for input(s): TSH, T4TOTAL, FREET4, T3FREE, THYROIDAB in the last 72 hours. Anemia Panel: Recent Labs    07/02/18 1149  FERRITIN  138  TIBC 269  IRON 53   Sepsis Labs: No results for input(s): PROCALCITON, LATICACIDVEN in the last 168 hours.  No results found for this or any previous visit (from the past 240 hour(s)).   Radiology Studies: US Renal  Result Date: 07/01/2018 CLINICAL DATA:  Acute kidney injury EXAM: RENAL / URINARY TRACT ULTRASOUND COMPLETE COMPARISON:  None. FINDINGS: Right Kidney: Length: 10.8 cm. Echogenicity within normal limits. No mass or hydronephrosis visualized. Left Kidney: Length: 11.5 cm. Echogenicity within normal limits. No mass or hydronephrosis visualized. Bladder: Appears normal for degree of bladder distention. IMPRESSION: Negative renal ultrasound. Electronically Signed   By: Donavan Foil M.D.   On: 07/01/2018 18:58    Scheduled Meds: . allopurinol  100 mg Oral BID  . enoxaparin (LOVENOX) injection  30 mg Subcutaneous Q24H  . insulin aspart  0-9 Units Subcutaneous TID WC  . pantoprazole  40 mg Oral Daily  . sodium chloride flush  3 mL Intravenous Q12H   Continuous Infusions:    LOS: 2 days   Marylu Lund,  MD [facility] Pager On Amion  If 7PM-7AM, please contact night-coverage 07/03/2018, 5:44 PM

## 2018-07-03 NOTE — Evaluation (Signed)
Physical Therapy Evaluation Patient Details Name: Madison Holden MRN: [id number] DOB: 14-Aug-1946 Today's Date: 07/03/2018   History of Present Illness  Pt is a 73 y.o. F with significant PMH of DM, HLD, GERD, HTN, and endometrial cancer presenting with acute renal failure with hyperkalemia. Diagnosed with gout in May   Clinical Impression  Patient presents with functional limitations in mobility and transfers due to symmetrical joint pain (shoulder, knees, fingers), abdominal pain, balance deficits, and weakness. Ambulating 100 feet with cane, but also reliant on contralateral hand held support. Highly recommended bilateral external support I.e. Using Rollator for safety with walking. Of note, patient stating she has had lower abdominal pain with nausea/vomiting today; RN aware. Patient will benefit from continued PT to increase their independence and safety with mobility to allow discharge to OPPT in order to maximize functional independence.     Follow Up Recommendations Outpatient PT;Supervision for mobility/OOB    Equipment Recommendations  Other (comment)(Rollator)    Recommendations for Other Services       Precautions / Restrictions Precautions Precautions: Fall Restrictions Weight Bearing Restrictions: No      Mobility  Bed Mobility Overal bed mobility: Modified Independent                Transfers Overall transfer level: Needs assistance Equipment used: Straight cane Transfers: Sit to/from Stand Sit to Stand: Supervision            Ambulation/Gait Ambulation/Gait assistance: Min guard Gait Distance (Feet): 100 Feet Assistive device: Straight cane;1 person hand held assist Gait Pattern/deviations: Step-through pattern;Trunk flexed;Decreased stride length;Antalgic Gait velocity: decreased Gait velocity interpretation: <1.8 ft/sec, indicate of risk for recurrent falls General Gait Details: pt with antalgic gait pattern requiring both cane in right  hand and left hand held assist (moderate reliance through hand).   Stairs            Wheelchair Mobility    Modified Rankin (Stroke Patients Only)       Balance Overall balance assessment: Needs assistance Sitting-balance support: No upper extremity supported;Feet supported Sitting balance-Leahy Scale: Normal     Standing balance support: During functional activity;Single extremity supported Standing balance-Leahy Scale: Fair                               Pertinent Vitals/Pain Pain Assessment: Faces Faces Pain Scale: Hurts little more Pain Location: abdomen Pain Descriptors / Indicators: Discomfort;Aching;Tender Pain Intervention(s): Monitored during session    Home Living Family/patient expects to be discharged to:: Private residence Living Arrangements: Spouse/significant other Available Help at Discharge: Family;Available 24 hours/day Type of Home: House Home Access: Stairs to enter Entrance Stairs-Rails: Psychiatric nurse of Steps: 2 Home Layout: One level Home Equipment: Cane - single point;Shower seat      Prior Function Level of Independence: Needs assistance   Gait / Transfers Assistance Needed: patient uses cane and "furniture walks" at home and husband assists her with outdoor ambulation  ADL's / Homemaking Assistance Needed: independent  Comments: reports increasing weakness over last few weeks     Hand Dominance        Extremity/Trunk Assessment   Upper Extremity Assessment Upper Extremity Assessment: Overall WFL for tasks assessed    Lower Extremity Assessment Lower Extremity Assessment: RLE deficits/detail;LLE deficits/detail RLE Deficits / Details: Grossly 5/5. knee joint tender to palpation LLE Deficits / Details: MMT: hip flexion 3+/5, knee extension 4/5. knee joint tender to palpation    Cervical /  Trunk Assessment Cervical / Trunk Assessment: Other exceptions Cervical / Trunk Exceptions: forward  head posture  Communication   Communication: No difficulties  Cognition Arousal/Alertness: Awake/alert Behavior During Therapy: WFL for tasks assessed/performed Overall Cognitive Status: Within Functional Limits for tasks assessed                                        General Comments      Exercises     Assessment/Plan    PT Assessment Patient needs continued PT services  PT Problem List Decreased strength;Decreased activity tolerance;Decreased balance;Decreased mobility;Pain       PT Treatment Interventions DME instruction;Functional mobility training;Therapeutic activities;Therapeutic exercise;Balance training;Patient/family education;Gait training;Stair training    PT Goals (Current goals can be found in the Care Plan section)  Acute Rehab PT Goals Patient Stated Goal: "lose weight and walk more." PT Goal Formulation: With patient/family Time For Goal Achievement: 07/17/18 Potential to Achieve Goals: Good    Frequency Min 3X/week   Barriers to discharge        Co-evaluation               AM-PAC PT "6 Clicks" Daily Activity  Outcome Measure Difficulty turning over in bed (including adjusting bedclothes, sheets and blankets)?: None Difficulty moving from lying on back to sitting on the side of the bed? : None Difficulty sitting down on and standing up from a chair with arms (e.g., wheelchair, bedside commode, etc,.)?: None Help needed moving to and from a bed to chair (including a wheelchair)?: A Little Help needed walking in hospital room?: A Little Help needed climbing 3-5 steps with a railing? : A Lot 6 Click Score: 20    End of Session Equipment Utilized During Treatment: Gait belt Activity Tolerance: Patient tolerated treatment well Patient left: in bed;with call bell/phone within reach;with family/visitor present Nurse Communication: Mobility status PT Visit Diagnosis: Unsteadiness on feet (R26.81);Difficulty in walking, not  elsewhere classified (R26.2)    Time: 0092-3300 PT Time Calculation (min) (ACUTE ONLY): 17 min   Charges:   PT Evaluation $PT Eval Moderate Complexity: 1 Mod          Ellamae Sia, [place], DPT Acute Rehabilitation Services Pager [phone] Office [phone]   Willy Eddy 07/03/2018, 2:57 PM

## 2018-07-04 ENCOUNTER — Ambulatory Visit (HOSPITAL_COMMUNITY): Payer: Medicare Other

## 2018-07-04 LAB — RENAL FUNCTION PANEL
Albumin: 3.2 g/dL — ABNORMAL LOW (ref 3.5–5.0)
Anion gap: 13 (ref 5–15)
BUN: 19 mg/dL (ref 8–23)
CHLORIDE: 93 mmol/L — AB (ref 98–111)
CO2: 29 mmol/L (ref 22–32)
CREATININE: 1.2 mg/dL — AB (ref 0.44–1.00)
Calcium: 8.9 mg/dL (ref 8.9–10.3)
GFR calc Af Amer: 52 mL/min — ABNORMAL LOW (ref >60)
GFR, EST NON AFRICAN AMERICAN: 45 mL/min — AB (ref >60)
GLUCOSE: 83 mg/dL (ref 70–99)
Phosphorus: 2.8 mg/dL (ref 2.5–4.6)
Potassium: 3.6 mmol/L (ref 3.5–5.1)
Sodium: 135 mmol/L (ref 135–145)

## 2018-07-04 LAB — GLUCOSE, CAPILLARY: Glucose-Capillary: 97 mg/dL (ref 70–99)

## 2018-07-04 MED ORDER — ENOXAPARIN SODIUM 40 MG/0.4ML ~~LOC~~ SOLN: 40.0000 mg | SUBCUTANEOUS | Status: DC

## 2018-07-04 NOTE — Progress Notes (Signed)
Patient discharged to home. Patient AVS reviewed and signed. Patient capable re-verbalizing medications and follow-up appointments. IV removed. Patient belongings sent with patient. Patient educated to return to the ED in the event of SOB, chest pain or dizziness.   Trejuan Matherne B. RN 

## 2018-07-04 NOTE — Discharge Summary (Signed)
Physician Discharge Summary  Madison Holden ZRA:[id number] DOB: 25-Jun-1946 DOA: 07/01/2018  PCP: Orpah Melter, MD  Admit date: 07/01/2018 Discharge date: 07/04/2018  Admitted From: Home Disposition:  Home  Recommendations for Outpatient Follow-up:  1. Follow up with PCP in 1-2 weeks 2. Repeat basic meta 3. Please follow up on the following pending results:  Discharge Condition:Improved CODE STATUS:Full Diet recommendation: Diabetic   Brief/Interim Summary: 72 y.o.femalewith medical history significant ofDM; HLD; GERD; HTN; and endometrial cancer presenting with abnormal labs.About 2-3 weeks ago, her OB/Gyn saw abnormal labs and recommended uterus scraping. She was placed under anesthesia and diagnosed endometrial cancer (likely confined); she will need a total hysterectomy but hopefully no chemo/rads. Prior to that, she had her first flare-up of gout in April, diagnosed in May. She has tried a variety of drugs (Mobic, Allopurinol, Colchicine, prednisone for about 6 weeks). Within the last 10 days, she is taking hydrocodone for the pain in addition to the other gout medications. At pre-op today for the hysterectomy, she as found to have acute renal failure with hyperkalemia. She has been feeling okay except for the gout, with flare-ups in her hands, shoulders, and other joints. She has been feeling like she was going downhill with all these medications and she has been having a hard time walking. She can't eat/drink. +n/v x the last week. + diarrhea x 2 weeks, once or twice a day.  She is scheduled for TAH on 9/17. She did have CP/SOB a few weeks ago and she is scheduled to a pre-op stress test over the next few days.  ED Course:Severe hyperkalemia. H/o endometrial CA, getting pre-op labs for hysterectomy. K+ 7.0, 7.2 Nephrology consulted  Acute renal failure -Baseline creatinine is0.7 (2010) -Likely due to prerenal failure secondary to dehydration and  continuation of ACEI, diruetics,NSAIDs, and other nephrotoxic medications. -Nephrotoxins on hold -Completed course of sodium bicarb infusion -US-renal reviewed. Normal -Nephrology had been following -Renal function much improved and nephrology has since signed off -Recommend repeat BMET in one week, to be follow up by PCP. Recommend follow up with Nephrology as needed  Hyperkalemia -Patient with acute/subacute hyperkalemia which appears to be resulting fromacute renal failureI due to volume deficiency in setting ofmultiple nephrotoxic medications -Now much improved  Endometrial carcinoma -Patient was incidentally found to have lab abnormalities at pre-op evaluation -She is also scheduled to have pre-op stress test and echo later this week; these will likely need to be delayed (depending on how long she is hospitalized) -Patient to follow up with Surgery as outpatient. -Increased bloating recently  DM -Hgb A1c of 5.6 -hold Glucophageand Actos while in hospital -Continued on SSI coverage while in hospital  HTN -Hold home medications including HCTZ and Lisinopril -Verapamil on hold secondary to soft BP -BP stable at this time  Gout -Patient is thought to have gout, although she has chronic pain in her hands primarily associated with her issue -Additionally, she has been given Allopurinol; colchicine; NSAIDs; and a recent course of prednisone - all without resolution -Will likely to need outpatient rheumatology follow-up -Presently stable. Outpatient PT recommended by PT  Nauseea/Vomiting -New finding this admission -Patient reports increased "bloating" -Reports passing flatus -Pt also reports lower quadrant tenderness -Given concerns of possible endomedrial carcinoma, have ordered CT abd/pelvis without contrast. Reviewed. Unremarkable study without obsstruction -Of note, symptoms started after receiving cathartics for constipation. Suspect effect of cathartics. Now  improving  Discharge Diagnoses:  Principal Problem:   Acute renal failure (ARF) ([place]) Active Problems:  Essential hypertension   Diabetes mellitus ([place])   Endometrial carcinoma ([place])   Gout   Hyperkalemia    Discharge Instructions   Allergies as of 07/04/2018   No Known Allergies     Medication List    STOP taking these medications   hydrochlorothiazide 25 MG tablet Commonly known as:  HYDRODIURIL   lisinopril 40 MG tablet Commonly known as:  PRINIVIL,ZESTRIL   verapamil 240 MG CR tablet Commonly known as:  CALAN-SR     TAKE these medications   allopurinol 100 MG tablet Commonly known as:  ZYLOPRIM Take 100 mg by mouth 2 (two) times daily. For Gout   colchicine 0.6 MG tablet Take 0.6 mg by mouth 2 (two) times daily. For Gout   diphenhydrAMINE 25 MG tablet Commonly known as:  BENADRYL Take 25 mg by mouth at bedtime as needed for sleep.   esomeprazole 20 MG capsule Commonly known as:  NEXIUM Take 20 mg by mouth 3 (three) times a week.   HYDROcodone-acetaminophen 5-325 MG tablet Commonly known as:  NORCO/VICODIN Take 1 tablet by mouth every 6 (six) hours as needed for moderate pain.   IMODIUM A-D PO Take 1 capsule by mouth as needed.   Magnesium 500 MG Tabs Take 500 mg by mouth every other day.   Melatonin 3 MG Tabs Take 3-6 mg by mouth at bedtime as needed (sleep).   metFORMIN 1000 MG tablet Commonly known as:  GLUCOPHAGE Take 1,000 mg by mouth 2 (two) times daily with a meal.   oxybutynin 5 MG tablet Commonly known as:  DITROPAN Take 5 mg by mouth daily. May take a second 5 mg dose as needed for bladder spasms   pioglitazone 15 MG tablet Commonly known as:  ACTOS Take 7.5 mg by mouth daily.   PRESCRIPTION MEDICATION Per pt rheumotologist has written prescription for nausea today 07-01-2018 , unsure of name , has not pick up from pharmacy yet.  Asked pt to bring drug name dos.   SYSTANE OP Place 1 drop into both eyes daily as needed (dry  eyes).   vitamin C 500 MG tablet Commonly known as:  ASCORBIC ACID Take 500 mg by mouth 4 (four) times a week.            Durable Medical Equipment  (From admission, onward)         Start     Ordered   07/03/18 1517  For home use only DME 4 wheeled rolling walker with seat  Once    Question:  Patient needs a walker to treat with the following condition  Answer:  Weakness   07/03/18 1516         Follow-up Information    Everitt Amber, MD Follow up.   Specialty:  Gynecologic Oncology Why:  Our office will contact you after discharge about rescheduling your cardiology eval and for an appointment to meet with your surgeon in the office before your surgery. Contact information: [place] [address] [phone]        [facility] Follow up.   Why:  Rolator will be delivered to room prior to discharged Contact information: [address] [place] [address] [phone]        Orpah Melter, MD. Schedule an appointment as soon as possible for a visit in 1 week(s).   Specialty:  Family Medicine Contact information: [place] [address] [phone]  No Known Allergies  Consultations:  [place]  Nephrology  Procedures/Studies: Ct Abdomen Pelvis Wo Contrast  Result Date: 07/03/2018 CLINICAL DATA:  Nausea, vomiting and diarrhea for 2 weeks. Recently diagnosed with endometrial carcinoma. EXAM: CT ABDOMEN AND PELVIS WITHOUT CONTRAST TECHNIQUE: Multidetector CT imaging of the abdomen and pelvis was performed following the standard protocol without IV contrast. COMPARISON:  None. FINDINGS: Lower chest: Bibasilar interstitial fibrosis and/or atelectasis. Small hiatal hernia. Hepatobiliary: No focal liver abnormality is seen. No gallstones, gallbladder wall thickening, or biliary dilatation. Pancreas: Unremarkable. No pancreatic ductal dilatation or surrounding inflammatory changes. Spleen:  Normal in size without focal abnormality. Adrenals/Urinary Tract: Adrenal glands appear normal. Kidneys are unremarkable without mass, stone or hydronephrosis. No ureteral or bladder calculi identified. Bladder appears normal, partially decompressed. Stomach/Bowel: No dilated large or small bowel loops. No evidence of bowel wall inflammation seen. Vascular/Lymphatic: Aortic atherosclerosis. No enlarged abdominal or pelvic lymph nodes. Reproductive: Uterus and bilateral adnexa are unremarkable. Other: No free fluid or abscess collection. No free intraperitoneal air. Musculoskeletal: Degenerative changes throughout the slightly scoliotic thoracolumbar spine, mild to moderate in degree. No acute or suspicious osseous finding. Small periumbilical abdominal wall hernia which contains fat only. IMPRESSION: 1. No acute findings within the abdomen or pelvis. No bowel obstruction or evidence of bowel wall inflammation. No free fluid or abscess collection. No evidence of metastatic disease within the abdomen or pelvis. 2.  Aortic Atherosclerosis (ICD10-I70.0). 3. Additional chronic/incidental findings detailed above. Electronically Signed   By: Franki Cabot M.D.   On: 07/03/2018 18:59   US Renal  Result Date: 07/01/2018 CLINICAL DATA:  Acute kidney injury EXAM: RENAL / URINARY TRACT ULTRASOUND COMPLETE COMPARISON:  None. FINDINGS: Right Kidney: Length: 10.8 cm. Echogenicity within normal limits. No mass or hydronephrosis visualized. Left Kidney: Length: 11.5 cm. Echogenicity within normal limits. No mass or hydronephrosis visualized. Bladder: Appears normal for degree of bladder distention. IMPRESSION: Negative renal ultrasound. Electronically Signed   By: Donavan Foil M.D.   On: 07/01/2018 18:58    Subjective: Eager to go home  Discharge Exam: Vitals:   07/04/18 0445 07/04/18 0744  BP: (!) 85/68 (!) 125/57  Pulse: 86 97  Resp: 18 18  Temp: 98.5 F (36.9 C) (!) 97.3 F (36.3 C)  SpO2: 90% (!) 89%    Vitals:   07/03/18 1730 07/03/18 2027 07/04/18 0445 07/04/18 0744  BP: 127/82 (!) 112/49 (!) 85/68 (!) 125/57  Pulse: 87 66 86 97  Resp: 18 18 18 18   Temp: 98.9 F (37.2 C) 98.9 F (37.2 C) 98.5 F (36.9 C) (!) 97.3 F (36.3 C)  TempSrc: Oral Oral Oral Oral  SpO2: 93% 90% 90% (!) 89%  Weight:      Height:        General: Pt is alert, awake, not in acute distress Cardiovascular: RRR, S1/S2 +, no rubs, no gallops Respiratory: CTA bilaterally, no wheezing, no rhonchi Abdominal: Soft, NT, ND, bowel sounds + Extremities: no edema, no cyanosis   The results of significant diagnostics from this hospitalization (including imaging, microbiology, ancillary and laboratory) are listed below for reference.     Microbiology: No results found for this or any previous visit (from the past 240 hour(s)).   Labs: BNP (last 3 results) No results for input(s): BNP in the last 8760 hours. Basic Metabolic Panel: Recent Labs  Lab 07/01/18 1420 07/01/18 2106 07/02/18 0207 07/02/18 1149 07/03/18 0612 07/04/18 0512  NA 129* 128* 128* 132* 135 135  K 7.2* 6.2* 6.2* 5.0  4.3 3.6  CL 101 105 102 101 93* 93*  CO2 12* 13* 13* 19* 28 29  GLUCOSE 83 99 90 100* 96 83  BUN 65* 62* 63* 52* 34* 19  CREATININE 3.56* 3.15* 3.10* 2.26* 1.45* 1.20*  CALCIUM 10.1 9.6 9.5 9.1 9.0 8.9  MG 1.7  --   --   --   --   --   PHOS 5.9* 5.3*  --   --  3.0 2.8   Liver Function Tests: Recent Labs  Lab 07/01/18 1150 07/01/18 2106 07/03/18 0612 07/04/18 0512  AST 34  --   --   --   ALT 22  --   --   --   ALKPHOS 133*  --   --   --   BILITOT 1.1  --   --   --   PROT 8.4*  --   --   --   ALBUMIN 4.3 3.6 3.2* 3.2*   No results for input(s): LIPASE, AMYLASE in the last 168 hours. No results for input(s): AMMONIA in the last 168 hours. CBC: Recent Labs  Lab 07/01/18 1150 07/01/18 1420 07/02/18 0207  WBC 9.5 8.1 5.8  NEUTROABS  --  6.6  --   HGB 10.5* 9.8* 9.0*  HCT 34.0* 33.1* 30.3*  MCV 87.0 91.7  90.7  PLT 548* 497* 450*   Cardiac Enzymes: Recent Labs  Lab 07/01/18 2106  CKTOTAL 51   BNP: Invalid input(s): POCBNP CBG: Recent Labs  Lab 07/03/18 0736 07/03/18 1231 07/03/18 1633 07/03/18 2026 07/04/18 0746  GLUCAP 84 97 92 91 97   D-Dimer No results for input(s): DDIMER in the last 72 hours. Hgb A1c Recent Labs    07/01/18 2106  HGBA1C 5.6   Lipid Profile No results for input(s): CHOL, HDL, LDLCALC, TRIG, CHOLHDL, LDLDIRECT in the last 72 hours. Thyroid function studies No results for input(s): TSH, T4TOTAL, T3FREE, THYROIDAB in the last 72 hours.  Invalid input(s): FREET3 Anemia work up Recent Labs    07/02/18 1149  FERRITIN 138  TIBC 269  IRON 53   Urinalysis    Component Value Date/Time   COLORURINE STRAW (A) 07/01/2018 2336   APPEARANCEUR CLEAR 07/01/2018 2336   LABSPEC 1.006 07/01/2018 2336   PHURINE 5.0 07/01/2018 2336   GLUCOSEU NEGATIVE 07/01/2018 2336   HGBUR NEGATIVE 07/01/2018 2336   [place] NEGATIVE 07/01/2018 2336   KETONESUR NEGATIVE 07/01/2018 2336   PROTEINUR NEGATIVE 07/01/2018 2336   NITRITE NEGATIVE 07/01/2018 2336   LEUKOCYTESUR TRACE (A) 07/01/2018 2336   Sepsis Labs Invalid input(s): PROCALCITONIN,  WBC,  LACTICIDVEN Microbiology No results found for this or any previous visit (from the past 240 hour(s)).  Time spent: 27min  SIGNED:   Marylu Lund, MD  [facility] 07/04/2018, 2:30 PM  If 7PM-7AM, please contact night-coverage

## 2018-07-04 NOTE — Care Management Important Message (Signed)
Important Message  Patient Details  Name: Madison Holden MRN: [id number] Date of Birth: 17-Jan-1946   Medicare Important Message Given:  Yes    Madison Holden 07/04/2018, 3:53 PM

## 2018-07-04 NOTE — Progress Notes (Signed)
PT Cancellation Note  Patient Details Name: Madison Holden MRN: [id number] DOB: [birth year]   Cancelled Treatment:    Reason Eval/Treat Not Completed: Other (comment). Patient declining secondary to fatigue and continued nausea. Reviewed Rollator parts/management. Continue to recommend OPPT at discharge.  Ellamae Sia, PT, DPT Acute Rehabilitation Services Pager [phone] Office [phone]    Willy Eddy 07/04/2018, 12:00 PM

## 2018-07-04 NOTE — Evaluation (Signed)
Occupational Therapy Evaluation Patient Details Name: Madison Holden MRN: [id number] DOB: [birth year] Today's Date: 07/04/2018    History of Present Illness Pt is a 72 y.o. F with significant PMH of DM, HLD, GERD, HTN, and endometrial cancer presenting with acute renal failure with hyperkalemia. Diagnosed with gout in May    Clinical Impression   Pt admitted with the above diagnoses and presents with below problem list. Pt will benefit from continued acute OT to address the below listed deficits and maximize independence with basic ADLs. PTA pt was mod I with ADLs. Pt is currently setup to min guard with ADLs. Fatigued noted during session. Educated on energy conservation strategies.      Follow Up Recommendations  No OT follow up    Equipment Recommendations  None recommended by OT    Recommendations for Other Services       Precautions / Restrictions Precautions Precautions: Fall Restrictions Weight Bearing Restrictions: No      Mobility Bed Mobility Overal bed mobility: Modified Independent                Transfers Overall transfer level: Needs assistance Equipment used: Straight cane Transfers: Sit to/from Stand Sit to Stand: Supervision         General transfer comment: to/from EOB and chair    Balance Overall balance assessment: Needs assistance Sitting-balance support: No upper extremity supported;Feet supported Sitting balance-Leahy Scale: Normal     Standing balance support: During functional activity;Single extremity supported Standing balance-Leahy Scale: Fair                             ADL either performed or assessed with clinical judgement   ADL Overall ADL's : Needs assistance/impaired Eating/Feeding: Set up;Sitting   Grooming: Set up;Sitting   Upper Body Bathing: setup;Sitting   Lower Body Bathing: Min guard;Sit to/from stand   Upper Body Dressing : Set up;Sitting   Lower Body Dressing: Min guard;Sit to/from  stand   Toilet Transfer: Min guard;Ambulation(cane)   Toileting- Clothing Manipulation and Hygiene: Min guard;Sit to/from stand   Tub/ Shower Transfer: Min guard;Tub transfer;Minimal assistance;Ambulation   Functional mobility during ADLs: Min guard;Cane(in the room) General ADL Comments: Pt completed grooming task at sink, in room functional mobility, and simulated toilet transfers.      Vision         Perception     Praxis      Pertinent Vitals/Pain Pain Assessment: Faces Faces Pain Scale: Hurts little more Pain Location: abdomen Pain Descriptors / Indicators: Discomfort;Aching;Tender Pain Intervention(s): Monitored during session     Hand Dominance     Extremity/Trunk Assessment Upper Extremity Assessment Upper Extremity Assessment: Generalized weakness(gout in both hands, fatigues easily with over head tasks)   Lower Extremity Assessment Lower Extremity Assessment: Defer to PT evaluation   Cervical / Trunk Assessment Cervical / Trunk Assessment: Other exceptions Cervical / Trunk Exceptions: forward head posture   Communication Communication Communication: No difficulties   Cognition Arousal/Alertness: Awake/alert Behavior During Therapy: WFL for tasks assessed/performed Overall Cognitive Status: Within Functional Limits for tasks assessed                                     General Comments       Exercises     Shoulder Instructions      Home Living Family/patient expects to be discharged to:: Private residence  Living Arrangements: Spouse/significant other Available Help at Discharge: Family;Available 24 hours/day Type of Home: House Home Access: Stairs to enter [employer] of Steps: 2 Entrance Stairs-Rails: Right;Left Home Layout: One level     Bathroom Shower/Tub: Teacher, early years/pre: Standard     Home Equipment: Cane - single point;Shower seat          Prior Functioning/Environment Level of  Independence: Needs assistance  Gait / Transfers Assistance Needed: patient uses cane and "furniture walks" at home and husband assists her with outdoor ambulation ADL's / Homemaking Assistance Needed: independent(mod I)   Comments: reports increasing weakness over last few weeks        OT Problem List: Decreased activity tolerance;Impaired balance (sitting and/or standing);Decreased knowledge of use of DME or AE;Decreased knowledge of precautions;Pain;Decreased strength      OT Treatment/Interventions: Self-care/ADL training;Energy conservation;DME and/or AE instruction;Therapeutic activities;Patient/family education;Balance training    OT Goals(Current goals can be found in the care plan section) Acute Rehab OT Goals Patient Stated Goal: "lose weight and walk more." OT Goal Formulation: With patient Time For Goal Achievement: 07/11/18 Potential to Achieve Goals: Good ADL Goals Pt Will Perform Grooming: with modified independence;sitting;standing Pt Will Perform Upper Body Bathing: with modified independence;sitting Pt Will Perform Lower Body Bathing: with modified independence;sit to/from stand Pt Will Transfer to Toilet: with modified independence;ambulating Pt Will Perform Toileting - Clothing Manipulation and hygiene: with modified independence;sit to/from stand Pt Will Perform Tub/Shower Transfer: Tub transfer;with modified independence;ambulating  OT Frequency: Min 2X/week   Barriers to D/C:            Co-evaluation              AM-PAC PT "6 Clicks" Daily Activity     Outcome Measure Help from another person eating meals?: None Help from another person taking care of personal grooming?: A Little Help from another person toileting, which includes using toliet, bedpan, or urinal?: None Help from another person bathing (including washing, rinsing, drying)?: A Little Help from another person to put on and taking off regular upper body clothing?: None Help from  another person to put on and taking off regular lower body clothing?: None 6 Click Score: 22   End of Session Equipment Utilized During Treatment: Other (comment)(cane)  Activity Tolerance: Patient limited by fatigue;Patient tolerated treatment well Patient left:    OT Visit Diagnosis: Unsteadiness on feet (R26.81);Muscle weakness (generalized) (M62.81);Pain                Time: 0973-5329 OT Time Calculation (min): 23 min Charges:  OT General Charges $OT Visit: 1 Visit OT Evaluation $OT Eval Low Complexity: 1 Low OT Treatments $Self Care/Home Management : 8-22 mins  Tyrone Schimke, OT Acute Rehabilitation Services Pager: [phone] Office: [phone]   07/04/2018, 10:38 AM

## 2018-07-04 NOTE — Progress Notes (Signed)
Patient resting, alert, oriented in bed.  Stating she feels better today than yesterday.  No nausea or emesis reported this am.  Last episode yesterday afternoon.  Lower abdominal cramping remains.  Having loose BMs and passing flatus.  Voiding without difficulty.  CT scan findings discussed.  Advised her that our office attempted to reschedule her cardiac eval but the office stated the patient would need to call to arrange.  All questions answered.  Husband met at the elevator as well and questions/concerns addressed. GYN ONC will continue to follow.  Exam: Lungs clear.  HR reg rate and rhythm. Abdomen soft, non-tympanic, no tenderness reported with palpation of the lower abdomen, no edema in lower extrem.

## 2018-07-08 ENCOUNTER — Telehealth (HOSPITAL_COMMUNITY): Payer: Self-pay

## 2018-07-08 LAB — TYPE AND SCREEN
ABO/RH(D): B POS
Antibody Screen: NEGATIVE

## 2018-07-08 NOTE — Telephone Encounter (Signed)
Encounter complete. 

## 2018-07-10 ENCOUNTER — Ambulatory Visit (HOSPITAL_COMMUNITY)
Admission: RE | Admit: 2018-07-10 | Discharge: 2018-07-10 | Disposition: A | Discharge status: Home / Self Care | Payer: Medicare Other | Source: Ambulatory Visit | Attending: Cardiovascular Disease | Admitting: Cardiovascular Disease

## 2018-07-10 DIAGNOSIS — Z01810 Encounter for preprocedural cardiovascular examination: Secondary | ICD-10-CM

## 2018-07-10 MED ORDER — TECHNETIUM TC 99M TETROFOSMIN IV KIT
32.4000 | PACK | Freq: Once | INTRAVENOUS | Status: AC | PRN
  Administered 2018-07-10: 32.4 via INTRAVENOUS
  Filled 2018-07-10: qty 33

## 2018-07-10 MED ORDER — REGADENOSON 0.4 MG/5ML IV SOLN
0.4000 mg | Freq: Once | INTRAVENOUS | Status: AC
  Administered 2018-07-10: 0.4 mg via INTRAVENOUS

## 2018-07-11 ENCOUNTER — Ambulatory Visit (HOSPITAL_BASED_OUTPATIENT_CLINIC_OR_DEPARTMENT_OTHER): Payer: Medicare Other

## 2018-07-11 ENCOUNTER — Ambulatory Visit (HOSPITAL_COMMUNITY)
Admission: RE | Admit: 2018-07-11 | Discharge: 2018-07-11 | Disposition: A | Discharge status: Home / Self Care | Payer: Medicare Other | Source: Ambulatory Visit | Attending: Internal Medicine | Admitting: Internal Medicine

## 2018-07-11 DIAGNOSIS — Z01810 Encounter for preprocedural cardiovascular examination: Secondary | ICD-10-CM | POA: Insufficient documentation

## 2018-07-11 LAB — MYOCARDIAL PERFUSION IMAGING
CHL CUP NUCLEAR SRS: 1
LV dias vol: 65 mL (ref 46–106)
LVSYSVOL: 19 mL
Peak HR: 114 {beats}/min
Rest HR: 88 {beats}/min
SDS: 5
SSS: 6
TID: 0.7

## 2018-07-11 MED ORDER — TECHNETIUM TC 99M TETROFOSMIN IV KIT
31.5000 | PACK | Freq: Once | INTRAVENOUS | Status: AC | PRN
  Administered 2018-07-11: 31.5 via INTRAVENOUS

## 2018-07-15 ENCOUNTER — Telehealth: Payer: Self-pay | Admitting: Oncology

## 2018-07-15 NOTE — Telephone Encounter (Signed)
Left a message for patient advising her that surgery has been scheduled but that we have not received cardiac clearance yet.  Requested a return call.

## 2018-07-15 NOTE — Telephone Encounter (Signed)
Madison Holden called back and asked when her preop appointment will be.  Advised her that I will find out and call her back.

## 2018-07-15 NOTE — Telephone Encounter (Signed)
Left Jordynne a message with appointment to see Dr. Denman George on Friday, 07/18/18 at 9:45 and also advised her that preop will be calling to schedule an appointment.

## 2018-07-16 ENCOUNTER — Ambulatory Visit (HOSPITAL_COMMUNITY): Payer: Self-pay

## 2018-07-16 DIAGNOSIS — N179 Acute kidney failure, unspecified: Secondary | ICD-10-CM | POA: Diagnosis not present

## 2018-07-16 DIAGNOSIS — I10 Essential (primary) hypertension: Secondary | ICD-10-CM | POA: Diagnosis not present

## 2018-07-16 DIAGNOSIS — E11319 Type 2 diabetes mellitus with unspecified diabetic retinopathy without macular edema: Secondary | ICD-10-CM | POA: Diagnosis not present

## 2018-07-16 DIAGNOSIS — R609 Edema, unspecified: Secondary | ICD-10-CM | POA: Diagnosis not present

## 2018-07-17 ENCOUNTER — Ambulatory Visit (HOSPITAL_COMMUNITY): Payer: Self-pay

## 2018-07-17 ENCOUNTER — Other Ambulatory Visit (HOSPITAL_COMMUNITY): Payer: Self-pay

## 2018-07-17 NOTE — Patient Instructions (Signed)
Madison Holden [facility]  07/17/2018   Your procedure is scheduled on: 07/22/2018   Report to [facility] Main  Entrance  Report to admitting at   0830 AM    Call this number if you have problems the morning of surgery [phone]   Remember: Do not eat food or drink liquids :After Midnight. BRUSH YOUR TEETH MORNING OF SURGERY AND RINSE YOUR MOUTH OUT, NO CHEWING GUM CANDY OR MINTS. Eat a light diet the day before surgery.  Examples include: soups, toast, yogurt, broths and mashed potatoes.  Things to avoid include: carbonated beverages, raw fruits and vegetable and beans.      Take these medicines the morning of surgery with A SIP OF WATER: Allopurinol, colchicine, nexium, magnesium, eye drops as usual  DO NOT TAKE ANY DIABETIC MEDICATIONS DAY OF YOUR SURGERY                               You may not have any metal on your body including hair pins and              piercings  Do not wear jewelry, make-up, lotions, powders or perfumes, deodorant             Do not wear nail polish.  Do not shave  48 hours prior to surgery.                 Do not bring valuables to the hospital. [place].  Contacts, dentures or bridgework may not be worn into surgery.  Leave suitcase in the car. After surgery it may be brought to your room.     Special Instructions: coughing and deep breathing exercises, leg exercises               Please read over the following fact sheets you were given: _____________________________________________________________________             [facility] - Preparing for Surgery Before surgery, you can play an important role.  Because skin is not sterile, your skin needs to be as free of germs as possible.  You can reduce the number of germs on your skin by washing with CHG (chlorahexidine gluconate) soap before surgery.  CHG is an antiseptic cleaner which kills germs and bonds with the skin to continue  killing germs even after washing. Please DO NOT use if you have an allergy to CHG or antibacterial soaps.  If your skin becomes reddened/irritated stop using the CHG and inform your nurse when you arrive at Short Stay. Do not shave (including legs and underarms) for at least 48 hours prior to the first CHG shower.  You may shave your face/neck. Please follow these instructions carefully:  1.  Shower with CHG Soap the night before surgery and the  morning of Surgery.  2.  If you choose to wash your hair, wash your hair first as usual with your  normal  shampoo.  3.  After you shampoo, rinse your hair and body thoroughly to remove the  shampoo.                           4.  Use CHG as you would any other liquid  soap.  You can apply chg directly  to the skin and wash                       Gently with a scrungie or clean washcloth.  5.  Apply the CHG Soap to your body ONLY FROM THE NECK DOWN.   Do not use on face/ open                           Wound or open sores. Avoid contact with eyes, ears mouth and genitals (private parts).                       Wash face,  Genitals (private parts) with your normal soap.             6.  Wash thoroughly, paying special attention to the area where your surgery  will be performed.  7.  Thoroughly rinse your body with warm water from the neck down.  8.  DO NOT shower/wash with your normal soap after using and rinsing off  the CHG Soap.                9.  Pat yourself dry with a clean towel.            10.  Wear clean pajamas.            11.  Place clean sheets on your bed the night of your first shower and do not  sleep with pets. Day of Surgery : Do not apply any lotions/deodorants the morning of surgery.  Please wear clean clothes to the hospital/surgery center.  FAILURE TO FOLLOW THESE INSTRUCTIONS MAY RESULT IN THE CANCELLATION OF YOUR SURGERY PATIENT SIGNATURE_________________________________  NURSE  SIGNATURE__________________________________  ________________________________________________________________________  WHAT IS A BLOOD TRANSFUSION? Blood Transfusion Information  A transfusion is the replacement of blood or some of its parts. Blood is made up of multiple cells which provide different functions.  Red blood cells carry oxygen and are used for blood loss replacement.  White blood cells fight against infection.  Platelets control bleeding.  Plasma helps clot blood.  Other blood products are available for specialized needs, such as hemophilia or other clotting disorders. BEFORE THE TRANSFUSION  Who gives blood for transfusions?   Healthy volunteers who are fully evaluated to make sure their blood is safe. This is blood bank blood. Transfusion therapy is the safest it has ever been in the practice of medicine. Before blood is taken from a donor, a complete history is taken to make sure that person has no history of diseases nor engages in risky social behavior (examples are intravenous drug use or sexual activity with multiple partners). The donor's travel history is screened to minimize risk of transmitting infections, such as malaria. The donated blood is tested for signs of infectious diseases, such as HIV and hepatitis. The blood is then tested to be sure it is compatible with you in order to minimize the chance of a transfusion reaction. If you or a relative donates blood, this is often done in anticipation of surgery and is not appropriate for emergency situations. It takes many days to process the donated blood. RISKS AND COMPLICATIONS Although transfusion therapy is very safe and saves many lives, the main dangers of transfusion include:   Getting an infectious disease.  Developing a transfusion reaction. This is an allergic reaction to something in the blood you  were given. Every precaution is taken to prevent this. The decision to have a blood transfusion has been  considered carefully by your caregiver before blood is given. Blood is not given unless the benefits outweigh the risks. AFTER THE TRANSFUSION  Right after receiving a blood transfusion, you will usually feel much better and more energetic. This is especially true if your red blood cells have gotten low (anemic). The transfusion raises the level of the red blood cells which carry oxygen, and this usually causes an energy increase.  The nurse administering the transfusion will monitor you carefully for complications. HOME CARE INSTRUCTIONS  No special instructions are needed after a transfusion. You may find your energy is better. Speak with your caregiver about any limitations on activity for underlying diseases you may have. SEEK MEDICAL CARE IF:   Your condition is not improving after your transfusion.  You develop redness or irritation at the intravenous (IV) site. SEEK IMMEDIATE MEDICAL CARE IF:  Any of the following symptoms occur over the next 12 hours:  Shaking chills.  You have a temperature by mouth above 102 F (38.9 C), not controlled by medicine.  Chest, back, or muscle pain.  People around you feel you are not acting correctly or are confused.  Shortness of breath or difficulty breathing.  Dizziness and fainting.  You get a rash or develop hives.  You have a decrease in urine output.  Your urine turns a dark color or changes to pink, red, or brown. Any of the following symptoms occur over the next 10 days:  You have a temperature by mouth above 102 F (38.9 C), not controlled by medicine.  Shortness of breath.  Weakness after normal activity.  The white part of the eye turns yellow (jaundice).  You have a decrease in the amount of urine or are urinating less often.  Your urine turns a dark color or changes to pink, red, or brown. Document Released: 10/05/2000 Document Revised: 12/31/2011 Document Reviewed: 05/24/2008 ExitCare Patient Information 2014  [place].  _______________________________________________________________________  Incentive Spirometer  An incentive spirometer is a tool that can help keep your lungs clear and active. This tool measures how well you are filling your lungs with each breath. Taking long deep breaths may help reverse or decrease the chance of developing breathing (pulmonary) problems (especially infection) following:  A long period of time when you are unable to move or be active. BEFORE THE PROCEDURE   If the spirometer includes an indicator to show your best effort, your nurse or respiratory therapist will set it to a desired goal.  If possible, sit up straight or lean slightly forward. Try not to slouch.  Hold the incentive spirometer in an upright position. INSTRUCTIONS FOR USE  1. Sit on the edge of your bed if possible, or sit up as far as you can in bed or on a chair. 2. Hold the incentive spirometer in an upright position. 3. Breathe out normally. 4. Place the mouthpiece in your mouth and seal your lips tightly around it. 5. Breathe in slowly and as deeply as possible, raising the piston or the ball toward the top of the column. 6. Hold your breath for 3-5 seconds or for as long as possible. Allow the piston or ball to fall to the bottom of the column. 7. Remove the mouthpiece from your mouth and breathe out normally. 8. Rest for a few seconds and repeat Steps 1 through 7 at least 10 times every 1-2 hours when  you are awake. Take your time and take a few normal breaths between deep breaths. 9. The spirometer may include an indicator to show your best effort. Use the indicator as a goal to work toward during each repetition. 10. After each set of 10 deep breaths, practice coughing to be sure your lungs are clear. If you have an incision (the cut made at the time of surgery), support your incision when coughing by placing a pillow or rolled up towels firmly against it. Once you are able to get  out of bed, walk around indoors and cough well. You may stop using the incentive spirometer when instructed by your caregiver.  RISKS AND COMPLICATIONS  Take your time so you do not get dizzy or light-headed.  If you are in pain, you may need to take or ask for pain medication before doing incentive spirometry. It is harder to take a deep breath if you are having pain. AFTER USE  Rest and breathe slowly and easily.  It can be helpful to keep track of a log of your progress. Your caregiver can provide you with a simple table to help with this. If you are using the spirometer at home, follow these instructions: [place] IF:   You are having difficultly using the spirometer.  You have trouble using the spirometer as often as instructed.  Your pain medication is not giving enough relief while using the spirometer.  You develop fever of 100.5 F (38.1 C) or higher. SEEK IMMEDIATE MEDICAL CARE IF:   You cough up bloody sputum that had not been present before.  You develop fever of 102 F (38.9 C) or greater.  You develop worsening pain at or near the incision site. MAKE SURE YOU:   Understand these instructions.  Will watch your condition.  Will get help right away if you are not doing well or get worse. Document Released: 02/18/2007 Document Revised: 12/31/2011 Document Reviewed: 04/21/2007 [facility] Patient Information 2014 [place], [place].   ________________________________________________________________________

## 2018-07-18 ENCOUNTER — Inpatient Hospital Stay: Payer: Medicare Other | Attending: Gynecology | Admitting: Gynecologic Oncology

## 2018-07-18 ENCOUNTER — Encounter: Payer: Self-pay | Admitting: Gynecologic Oncology

## 2018-07-18 ENCOUNTER — Encounter (HOSPITAL_COMMUNITY): Payer: Self-pay

## 2018-07-18 ENCOUNTER — Other Ambulatory Visit: Payer: Self-pay

## 2018-07-18 ENCOUNTER — Encounter (HOSPITAL_COMMUNITY)
Admission: RE | Admit: 2018-07-18 | Discharge: 2018-07-18 | Disposition: A | Discharge status: Home / Self Care | Payer: Medicare Other | Source: Ambulatory Visit | Attending: Gynecologic Oncology | Admitting: Gynecologic Oncology

## 2018-07-18 VITALS — BP 120/82 | HR 100 | Temp 97.6°F | Resp 20 | Ht <= 58 in | Wt 199.0 lb

## 2018-07-18 DIAGNOSIS — C541 Malignant neoplasm of endometrium: Secondary | ICD-10-CM

## 2018-07-18 DIAGNOSIS — E669 Obesity, unspecified: Secondary | ICD-10-CM | POA: Insufficient documentation

## 2018-07-18 DIAGNOSIS — E119 Type 2 diabetes mellitus without complications: Secondary | ICD-10-CM | POA: Diagnosis not present

## 2018-07-18 DIAGNOSIS — Z01812 Encounter for preprocedural laboratory examination: Secondary | ICD-10-CM | POA: Diagnosis not present

## 2018-07-18 DIAGNOSIS — D649 Anemia, unspecified: Secondary | ICD-10-CM | POA: Insufficient documentation

## 2018-07-18 DIAGNOSIS — Z7984 Long term (current) use of oral hypoglycemic drugs: Secondary | ICD-10-CM | POA: Diagnosis not present

## 2018-07-18 DIAGNOSIS — F419 Anxiety disorder, unspecified: Secondary | ICD-10-CM | POA: Diagnosis not present

## 2018-07-18 LAB — COMPREHENSIVE METABOLIC PANEL
ALBUMIN: 3.2 g/dL — AB (ref 3.5–5.0)
ALT: 17 U/L (ref 0–44)
ANION GAP: 10 (ref 5–15)
AST: 25 U/L (ref 15–41)
Alkaline Phosphatase: 97 U/L (ref 38–126)
BUN: 17 mg/dL (ref 8–23)
CHLORIDE: 104 mmol/L (ref 98–111)
CO2: 25 mmol/L (ref 22–32)
Calcium: 9.4 mg/dL (ref 8.9–10.3)
Creatinine, Ser: 0.97 mg/dL (ref 0.44–1.00)
GFR calc Af Amer: >60 mL/min (ref >60)
GFR calc non Af Amer: 58 mL/min — ABNORMAL LOW (ref >60)
Glucose, Bld: 120 mg/dL — ABNORMAL HIGH (ref 70–99)
POTASSIUM: 4.3 mmol/L (ref 3.5–5.1)
SODIUM: 139 mmol/L (ref 135–145)
Total Bilirubin: 0.7 mg/dL (ref 0.3–1.2)
Total Protein: 7.4 g/dL (ref 6.5–8.1)

## 2018-07-18 LAB — CBC
HCT: 30.7 % — ABNORMAL LOW (ref 36.0–46.0)
Hemoglobin: 9.4 g/dL — ABNORMAL LOW (ref 12.0–15.0)
MCH: 27.1 pg (ref 26.0–34.0)
MCHC: 30.6 g/dL (ref 30.0–36.0)
MCV: 88.5 fL (ref 78.0–100.0)
Platelets: 632 10*3/uL — ABNORMAL HIGH (ref 150–400)
RBC: 3.47 MIL/uL — ABNORMAL LOW (ref 3.87–5.11)
RDW: 19.5 % — ABNORMAL HIGH (ref 11.5–15.5)
WBC: 10.9 10*3/uL — ABNORMAL HIGH (ref 4.0–10.5)

## 2018-07-18 LAB — URINALYSIS, ROUTINE W REFLEX MICROSCOPIC
Bilirubin Urine: NEGATIVE
Glucose, UA: NEGATIVE mg/dL
Hgb urine dipstick: NEGATIVE
Ketones, ur: NEGATIVE mg/dL
Leukocytes, UA: NEGATIVE
Nitrite: NEGATIVE
Protein, ur: NEGATIVE mg/dL
Specific Gravity, Urine: 1.008 (ref 1.005–1.030)
pH: 5 (ref 5.0–8.0)

## 2018-07-18 LAB — GLUCOSE, CAPILLARY: Glucose-Capillary: 103 mg/dL — ABNORMAL HIGH (ref 70–99)

## 2018-07-18 NOTE — Progress Notes (Signed)
Follow-up Note: Gyn-Onc   Madison Holden 72 y.o. female  Chief Complaint  Patient presents with  . 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  Several medical comorbidities including type 2 diabetes, hypertension, obesity, and recent bout of gout. Recent admission for hyperkalemia and AKI likely secondary from underlying CKD and nephrotoxic effects of pain meds.   Plan: A detailed discussion was held with the patient and her family with regard to to her endometrial cancer diagnosis. We discussed the standard management options for uterine cancer which includes surgery followed possibly by adjuvant therapy depending on the results of surgery. The options for surgical management include a hysterectomy and removal of the tubes and ovaries possibly with removal of pelvic and para-aortic lymph nodes.If feasible, a minimally invasive approach including a robotic hysterectomy or laparoscopic hysterectomy have benefits including shorter hospital stay, recovery time and better wound healing than with open surgery. The patient has been counseled about these surgical options and the risks of surgery in general including infection, bleeding, damage to surrounding structures (including bowel, bladder, ureters, nerves or vessels), and the postoperative risks of PE/ DVT, and lymphedema. I extensively reviewed the additional risks of robotic hysterectomy including possible need for conversion to open laparotomy.  I discussed positioning during surgery of trendelenberg and risks of minor facial swelling and care we take in preoperative positioning.  After counseling and consideration of her options, she desires to proceed with robotic assisted total hysterectomy with bilateral sapingo-oophorectomy and SLN biopsy.   She will be seen by anesthesia for preoperative clearance and discussion of postoperative pain management.  She was given the opportunity to ask  questions, which were answered to her satisfaction, and she is agreement with the above mentioned plan of care.  We will avoid lovenox periop (will give Heparin instead) and NSAID medications.   HPI: 72 year old white married female seen in consultation at the request of Dr. Philis Pique regarding management of a newly diagnosed endometrial cancer.  Patient had a routine Pap smear that showed atypical glandular cells.  Cervix was stenotic requiring the patient undergo a D&C which revealed a polypoid lesion in the endometrial cavity.  Final pathology showed this to be a well-differentiated endometrial carcinoma.  Uterus is normal size (sounded 7 cm).  Patient denies any postmenopausal bleeding or discharge.  She has no pelvic pain pressure and has no GI or GU symptoms.  She denies any past gynecologic history.  She has a number of medical problems listed above.  Most recently she has had a bout of gout causing fair amount of pain in her arms hands and legs.  Interval Hx: At her previous preop visit she was noted to have hyperkalemia (7.2) and AKI. She was admitted and determined to have acute on chronic kidney injury from NSAID use in the setting of a gout flare. Supportive therapy was instituted and she recovered normalizing her labs (normal potassium at recheck on 05/15/18).   Review of Systems:10 point review of systems is negative except as noted in interval history.   Vitals: Blood pressure 120/82, pulse 100, temperature 97.6 F (36.4 C), temperature source Oral, resp. rate 20, height 4\' 10"  (1.473 m), weight 199 lb (90.3 kg).  Physical Exam: General : The patient is a healthy woman in no acute distress.  HEENT: normocephalic, extraoccular movements normal; neck is supple without thyromegally  Lynphnodes: Supraclavicular and inguinal nodes not enlarged  Abdomen: Soft, non-tender, no ascites, no organomegally, no masses,  no hernias  Pelvic:  EGBUS: Normal female  Vagina: Normal, no lesions   Urethra and Bladder: Normal, non-tender  Cervix: Normal no lesions are noted Uterus: Difficult to delineate due to the patient's habitus. Bi-manual examination: Non-tender; no adenxal masses or nodularity    Lower extremities: No edema or varicosities. Normal range of motion      No Known Allergies  Past Medical History:  Diagnosis Date  . Ambulates with cane    due to gout   . Arthritis    hands  . Chest pain, unspecified 07-01-2018  per pt had 2 day chest pain and sob  6 weeks ago,, per pt no symptoms since   cardiology-- dr Salley Scarlet--  scheduled for stress test and echo 07-03-2018 prior to surgery scheduled 07-08-2018  . Diarrhea, unspecified    intermittant  . DOE (dyspnea on exertion)   . Endometrial cancer ([place])   . Essential hypertension   . Frequency of urination   . GERD (gastroesophageal reflux disease)   . Gout rheumologist-- michelle young PA ([employer] [facility] [employer] [facility])   07-01-2018 per pt flare up april 2019 , hands and wrists  . Heart murmur   . History of adenomatous polyp of colon   . History of esophageal dilatation    for stricture  . Mild nausea    per pt intermittant , no vomiting  . Mixed dyslipidemia   . Muscle weakness of extremity    bilateral upper and lower extremities due to gout  . Poor appetite    per pt   . Type 2 diabetes mellitus ([place])    followed by pcp  . Urgency of urination   . Urine frequency     Past Surgical History:  Procedure Laterality Date  . BREAST CYST EXCISION Right 1974   benign per pt  . CATARACT EXTRACTION W/ INTRAOCULAR LENS  IMPLANT, BILATERAL  2014;  2017  . MOUTH SURGERY  11/2017   Upper quadrants,  per pt peridontist did laser procedure on gums  . TONSILLECTOMY  child    Current Outpatient Medications  Medication Sig Dispense Refill  . allopurinol (ZYLOPRIM) 100 MG tablet Take 100 mg by mouth 2 (two) times daily. For Gout    . diphenhydrAMINE (BENADRYL) 25 MG tablet Take 25 mg by mouth at bedtime as  needed for sleep.     Marland Kitchen esomeprazole (NEXIUM) 20 MG capsule Take 20 mg by mouth 3 (three) times a week.    Marland Kitchen HYDROcodone-acetaminophen (NORCO/VICODIN) 5-325 MG tablet Take 1 tablet by mouth every 6 (six) hours as needed for moderate pain.    . Loperamide HCl (IMODIUM A-D PO) Take 1 capsule by mouth as needed.     . Magnesium 500 MG TABS Take 500 mg by mouth every other day.    . Melatonin 3 MG TABS Take 3-6 mg by mouth at bedtime as needed (sleep).     . metFORMIN (GLUCOPHAGE) 1000 MG tablet Take 1,000 mg by mouth 2 (two) times daily with a meal.    . oxybutynin (DITROPAN) 5 MG tablet Take 5 mg by mouth daily. May take a second 5 mg dose as needed for bladder spasms    . pioglitazone (ACTOS) 15 MG tablet Take 7.5 mg by mouth daily.    Vladimir Faster Glycol-Propyl Glycol (SYSTANE OP) Place 1 drop into both eyes daily as needed (dry eyes).    . vitamin C (ASCORBIC ACID) 500 MG tablet Take 500 mg by mouth 4 (four) times a week.  No current facility-administered medications for this visit.     Social History   Socioeconomic History  . Marital status: Married    Spouse name: Not on file  . Number of children: 0  . Years of education: Not on file  . Highest education level: Not on file  Occupational History  . Occupation: retired  Scientific laboratory technician  . Financial resource strain: Not on file  . Food insecurity:    Worry: Not on file    Inability: Not on file  . Transportation needs:    Medical: Not on file    Non-medical: Not on file  Tobacco Use  . Smoking status: Never Smoker  . Smokeless tobacco: Never Used  Substance and Sexual Activity  . Alcohol use: Not Currently    Comment: 2-3 per  week, not currently drinking since Apri; 2019  . Drug use: No  . Sexual activity: Not on file  Lifestyle  . Physical activity:    Days per week: Not on file    Minutes per session: Not on file  . Stress: Not on file  Relationships  . Social connections:    Talks on phone: Not on file    Gets  together: Not on file    Attends religious service: Not on file    Active member of club or organization: Not on file    Attends meetings of clubs or organizations: Not on file    Relationship status: Not on file  . Intimate partner violence:    Fear of current or ex partner: Not on file    Emotionally abused: Not on file    Physically abused: Not on file    Forced sexual activity: Not on file  Other Topics Concern  . Not on file  Social History Narrative   Has never smoked. Daily Caffeine Use- 2 cups per day. Does not get regular exercise. Retired.     Family History  Problem Relation Age of Onset  . Heart disease Mother   . Heart failure Mother   . Heart disease Father   . Prostate cancer Father   . Clotting disorder Father   . Diabetes Maternal Grandfather   . Colon cancer Neg Hx       Thereasa Solo, MD 07/18/2018, 10:39 AM

## 2018-07-18 NOTE — H&P (View-Only) (Signed)
Follow-up Note: Gyn-Onc   Madison Holden 72 y.o. female  Chief Complaint  Patient presents with  . 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  Several medical comorbidities including type 2 diabetes, hypertension, obesity, and recent bout of gout. Recent admission for hyperkalemia and AKI likely secondary from underlying CKD and nephrotoxic effects of pain meds.   Plan: A detailed discussion was held with the patient and her family with regard to to her endometrial cancer diagnosis. We discussed the standard management options for uterine cancer which includes surgery followed possibly by adjuvant therapy depending on the results of surgery. The options for surgical management include a hysterectomy and removal of the tubes and ovaries possibly with removal of pelvic and para-aortic lymph nodes.If feasible, a minimally invasive approach including a robotic hysterectomy or laparoscopic hysterectomy have benefits including shorter hospital stay, recovery time and better wound healing than with open surgery. The patient has been counseled about these surgical options and the risks of surgery in general including infection, bleeding, damage to surrounding structures (including bowel, bladder, ureters, nerves or vessels), and the postoperative risks of PE/ DVT, and lymphedema. I extensively reviewed the additional risks of robotic hysterectomy including possible need for conversion to open laparotomy.  I discussed positioning during surgery of trendelenberg and risks of minor facial swelling and care we take in preoperative positioning.  After counseling and consideration of her options, she desires to proceed with robotic assisted total hysterectomy with bilateral sapingo-oophorectomy and SLN biopsy.   She will be seen by anesthesia for preoperative clearance and discussion of postoperative pain management.  She was given the opportunity to ask  questions, which were answered to her satisfaction, and she is agreement with the above mentioned plan of care.  We will avoid lovenox periop (will give Heparin instead) and NSAID medications.   HPI: 72 year old white married female seen in consultation at the request of Dr. Philis Pique regarding management of a newly diagnosed endometrial cancer.  Patient had a routine Pap smear that showed atypical glandular cells.  Cervix was stenotic requiring the patient undergo a D&C which revealed a polypoid lesion in the endometrial cavity.  Final pathology showed this to be a well-differentiated endometrial carcinoma.  Uterus is normal size (sounded 7 cm).  Patient denies any postmenopausal bleeding or discharge.  She has no pelvic pain pressure and has no GI or GU symptoms.  She denies any past gynecologic history.  She has a number of medical problems listed above.  Most recently she has had a bout of gout causing fair amount of pain in her arms hands and legs.  Interval Hx: At her previous preop visit she was noted to have hyperkalemia (7.2) and AKI. She was admitted and determined to have acute on chronic kidney injury from NSAID use in the setting of a gout flare. Supportive therapy was instituted and she recovered normalizing her labs (normal potassium at recheck on 05/15/18).   Review of Systems:10 point review of systems is negative except as noted in interval history.   Vitals: Blood pressure 120/82, pulse 100, temperature 97.6 F (36.4 C), temperature source Oral, resp. rate 20, height 4\' 10"  (1.473 m), weight 199 lb (90.3 kg).  Physical Exam: General : The patient is a healthy woman in no acute distress.  HEENT: normocephalic, extraoccular movements normal; neck is supple without thyromegally  Lynphnodes: Supraclavicular and inguinal nodes not enlarged  Abdomen: Soft, non-tender, no ascites, no organomegally, no masses,  no hernias  Pelvic:  EGBUS: Normal female  Vagina: Normal, no lesions   Urethra and Bladder: Normal, non-tender  Cervix: Normal no lesions are noted Uterus: Difficult to delineate due to the patient's habitus. Bi-manual examination: Non-tender; no adenxal masses or nodularity    Lower extremities: No edema or varicosities. Normal range of motion      No Known Allergies  Past Medical History:  Diagnosis Date  . Ambulates with cane    due to gout   . Arthritis    hands  . Chest pain, unspecified 07-01-2018  per pt had 2 day chest pain and sob  6 weeks ago,, per pt no symptoms since   cardiology-- dr Salley Scarlet--  scheduled for stress test and echo 07-03-2018 prior to surgery scheduled 07-08-2018  . Diarrhea, unspecified    intermittant  . DOE (dyspnea on exertion)   . Endometrial cancer ([place])   . Essential hypertension   . Frequency of urination   . GERD (gastroesophageal reflux disease)   . Gout rheumologist-- michelle young PA ([facility])   07-01-2018 per pt flare up april 2019 , hands and wrists  . Heart murmur   . History of adenomatous polyp of colon   . History of esophageal dilatation    for stricture  . Mild nausea    per pt intermittant , no vomiting  . Mixed dyslipidemia   . Muscle weakness of extremity    bilateral upper and lower extremities due to gout  . Poor appetite    per pt   . Type 2 diabetes mellitus ([place])    followed by pcp  . Urgency of urination   . Urine frequency     Past Surgical History:  Procedure Laterality Date  . BREAST CYST EXCISION Right 1974   benign per pt  . CATARACT EXTRACTION W/ INTRAOCULAR LENS  IMPLANT, BILATERAL  2014;  2017  . MOUTH SURGERY  11/2017   Upper quadrants,  per pt peridontist did laser procedure on gums  . TONSILLECTOMY  child    Current Outpatient Medications  Medication Sig Dispense Refill  . allopurinol (ZYLOPRIM) 100 MG tablet Take 100 mg by mouth 2 (two) times daily. For Gout    . diphenhydrAMINE (BENADRYL) 25 MG tablet Take 25 mg by mouth at bedtime as  needed for sleep.     Marland Kitchen esomeprazole (NEXIUM) 20 MG capsule Take 20 mg by mouth 3 (three) times a week.    Marland Kitchen HYDROcodone-acetaminophen (NORCO/VICODIN) 5-325 MG tablet Take 1 tablet by mouth every 6 (six) hours as needed for moderate pain.    . Loperamide HCl (IMODIUM A-D PO) Take 1 capsule by mouth as needed.     . Magnesium 500 MG TABS Take 500 mg by mouth every other day.    . Melatonin 3 MG TABS Take 3-6 mg by mouth at bedtime as needed (sleep).     . metFORMIN (GLUCOPHAGE) 1000 MG tablet Take 1,000 mg by mouth 2 (two) times daily with a meal.    . oxybutynin (DITROPAN) 5 MG tablet Take 5 mg by mouth daily. May take a second 5 mg dose as needed for bladder spasms    . pioglitazone (ACTOS) 15 MG tablet Take 7.5 mg by mouth daily.    Vladimir Faster Glycol-Propyl Glycol (SYSTANE OP) Place 1 drop into both eyes daily as needed (dry eyes).    . vitamin C (ASCORBIC ACID) 500 MG tablet Take 500 mg by mouth 4 (four) times a week.  No current facility-administered medications for this visit.     Social History   Socioeconomic History  . Marital status: Married    Spouse name: Not on file  . Number of children: 0  . Years of education: Not on file  . Highest education level: Not on file  Occupational History  . Occupation: retired  Scientific laboratory technician  . Financial resource strain: Not on file  . Food insecurity:    Worry: Not on file    Inability: Not on file  . Transportation needs:    Medical: Not on file    Non-medical: Not on file  Tobacco Use  . Smoking status: Never Smoker  . Smokeless tobacco: Never Used  Substance and Sexual Activity  . Alcohol use: Not Currently    Comment: 2-3 per  week, not currently drinking since Apri; 2019  . Drug use: No  . Sexual activity: Not on file  Lifestyle  . Physical activity:    Days per week: Not on file    Minutes per session: Not on file  . Stress: Not on file  Relationships  . Social connections:    Talks on phone: Not on file    Gets  together: Not on file    Attends religious service: Not on file    Active member of club or organization: Not on file    Attends meetings of clubs or organizations: Not on file    Relationship status: Not on file  . Intimate partner violence:    Fear of current or ex partner: Not on file    Emotionally abused: Not on file    Physically abused: Not on file    Forced sexual activity: Not on file  Other Topics Concern  . Not on file  Social History Narrative   Has never smoked. Daily Caffeine Use- 2 cups per day. Does not get regular exercise. Retired.     Family History  Problem Relation Age of Onset  . Heart disease Mother   . Heart failure Mother   . Heart disease Father   . Prostate cancer Father   . Clotting disorder Father   . Diabetes Maternal Grandfather   . Colon cancer Neg Hx       Thereasa Solo, MD 07/18/2018, 10:39 AM

## 2018-07-18 NOTE — Progress Notes (Signed)
CBC and CMp done 07/18/2018 faxed via epic to Dr Denman George and Zoila Shutter

## 2018-07-18 NOTE — Progress Notes (Signed)
Requested LOV note from PCP - Dr Christella Noa.  They are to fax.

## 2018-07-18 NOTE — Progress Notes (Signed)
EKG-07/01/2018-epic  Stress Test and ECHO done 07/11/2018-epic

## 2018-07-18 NOTE — Progress Notes (Signed)
LOV note from Dr Annie Main Myers,PCp on chart from 07/16/2018.

## 2018-07-22 ENCOUNTER — Ambulatory Visit (HOSPITAL_COMMUNITY)
Admission: RE | Admit: 2018-07-22 | Discharge: 2018-07-23 | Disposition: A | Discharge status: Home / Self Care | Payer: Medicare Other | Source: Ambulatory Visit | Attending: Gynecologic Oncology | Admitting: Gynecologic Oncology

## 2018-07-22 ENCOUNTER — Ambulatory Visit (HOSPITAL_COMMUNITY): Payer: Medicare Other | Admitting: Anesthesiology

## 2018-07-22 ENCOUNTER — Encounter (HOSPITAL_COMMUNITY): Payer: Self-pay | Admitting: Anesthesiology

## 2018-07-22 ENCOUNTER — Encounter (HOSPITAL_COMMUNITY)
Admission: RE | Disposition: A | Discharge status: Home / Self Care | Payer: Self-pay | Source: Ambulatory Visit | Attending: Gynecologic Oncology

## 2018-07-22 DIAGNOSIS — M19041 Primary osteoarthritis, right hand: Secondary | ICD-10-CM | POA: Insufficient documentation

## 2018-07-22 DIAGNOSIS — K219 Gastro-esophageal reflux disease without esophagitis: Secondary | ICD-10-CM | POA: Insufficient documentation

## 2018-07-22 DIAGNOSIS — Z8249 Family history of ischemic heart disease and other diseases of the circulatory system: Secondary | ICD-10-CM | POA: Insufficient documentation

## 2018-07-22 DIAGNOSIS — E119 Type 2 diabetes mellitus without complications: Secondary | ICD-10-CM | POA: Insufficient documentation

## 2018-07-22 DIAGNOSIS — Z79899 Other long term (current) drug therapy: Secondary | ICD-10-CM | POA: Diagnosis not present

## 2018-07-22 DIAGNOSIS — C541 Malignant neoplasm of endometrium: Secondary | ICD-10-CM

## 2018-07-22 DIAGNOSIS — M109 Gout, unspecified: Secondary | ICD-10-CM | POA: Insufficient documentation

## 2018-07-22 DIAGNOSIS — Z86010 Personal history of colonic polyps: Secondary | ICD-10-CM | POA: Diagnosis not present

## 2018-07-22 DIAGNOSIS — M19042 Primary osteoarthritis, left hand: Secondary | ICD-10-CM | POA: Diagnosis not present

## 2018-07-22 DIAGNOSIS — Z6841 Body Mass Index (BMI) 40.0 and over, adult: Secondary | ICD-10-CM | POA: Insufficient documentation

## 2018-07-22 DIAGNOSIS — F419 Anxiety disorder, unspecified: Secondary | ICD-10-CM | POA: Insufficient documentation

## 2018-07-22 DIAGNOSIS — I10 Essential (primary) hypertension: Secondary | ICD-10-CM | POA: Diagnosis not present

## 2018-07-22 DIAGNOSIS — I2510 Atherosclerotic heart disease of native coronary artery without angina pectoris: Secondary | ICD-10-CM | POA: Diagnosis not present

## 2018-07-22 DIAGNOSIS — Z7984 Long term (current) use of oral hypoglycemic drugs: Secondary | ICD-10-CM | POA: Insufficient documentation

## 2018-07-22 DIAGNOSIS — R011 Cardiac murmur, unspecified: Secondary | ICD-10-CM | POA: Diagnosis not present

## 2018-07-22 DIAGNOSIS — E782 Mixed hyperlipidemia: Secondary | ICD-10-CM | POA: Insufficient documentation

## 2018-07-22 HISTORY — DX: Personal history of colonic polyps: Z86.010

## 2018-07-22 HISTORY — DX: Malignant neoplasm of endometrium: C54.1

## 2018-07-22 HISTORY — DX: Mixed hyperlipidemia: E78.2

## 2018-07-22 HISTORY — DX: Personal history of adenomatous and serrated colon polyps: Z86.0101

## 2018-07-22 HISTORY — DX: Type 2 diabetes mellitus without complications: E11.9

## 2018-07-22 HISTORY — DX: Other specified postprocedural states: Z98.890

## 2018-07-22 HISTORY — PX: ROBOTIC ASSISTED TOTAL HYSTERECTOMY WITH BILATERAL SALPINGO OOPHERECTOMY: SHX6086

## 2018-07-22 LAB — GLUCOSE, CAPILLARY
Glucose-Capillary: 103 mg/dL — ABNORMAL HIGH (ref 70–99)
Glucose-Capillary: 108 mg/dL — ABNORMAL HIGH (ref 70–99)
Glucose-Capillary: 210 mg/dL — ABNORMAL HIGH (ref 70–99)

## 2018-07-22 LAB — TYPE AND SCREEN
ABO/RH(D): B POS
Antibody Screen: NEGATIVE

## 2018-07-22 SURGERY — HYSTERECTOMY, TOTAL, ROBOT-ASSISTED, LAPAROSCOPIC, WITH BILATERAL SALPINGO-OOPHORECTOMY
Anesthesia: General | Laterality: Bilateral

## 2018-07-22 MED ORDER — SCOPOLAMINE 1 MG/3DAYS TD PT72
1.0000 | MEDICATED_PATCH | TRANSDERMAL | Status: DC
  Administered 2018-07-22: 1.5 mg via TRANSDERMAL
  Filled 2018-07-22: qty 1

## 2018-07-22 MED ORDER — ZOLPIDEM TARTRATE 5 MG PO TABS
5.0000 mg | ORAL_TABLET | Freq: Once | ORAL | Status: AC
  Administered 2018-07-22: 5 mg via ORAL
  Filled 2018-07-22: qty 1

## 2018-07-22 MED ORDER — PANTOPRAZOLE SODIUM 40 MG PO TBEC
40.0000 mg | DELAYED_RELEASE_TABLET | Freq: Every day | ORAL | Status: DC
  Administered 2018-07-23: 40 mg via ORAL
  Filled 2018-07-22: qty 1

## 2018-07-22 MED ORDER — PROPOFOL 10 MG/ML IV BOLUS
INTRAVENOUS | Status: AC
  Filled 2018-07-22: qty 20

## 2018-07-22 MED ORDER — IBUPROFEN 200 MG PO TABS
800.0000 mg | ORAL_TABLET | Freq: Four times a day (QID) | ORAL | Status: DC
  Filled 2018-07-22: qty 4

## 2018-07-22 MED ORDER — DEXAMETHASONE SODIUM PHOSPHATE 10 MG/ML IJ SOLN
INTRAMUSCULAR | Status: AC
  Filled 2018-07-22: qty 1

## 2018-07-22 MED ORDER — TRAMADOL HCL 50 MG PO TABS: 100.0000 mg | ORAL_TABLET | Freq: Two times a day (BID) | ORAL | Status: DC | PRN

## 2018-07-22 MED ORDER — SENNOSIDES-DOCUSATE SODIUM 8.6-50 MG PO TABS
2.0000 | ORAL_TABLET | Freq: Every day | ORAL | Status: DC
  Filled 2018-07-22: qty 2

## 2018-07-22 MED ORDER — ALLOPURINOL 100 MG PO TABS
100.0000 mg | ORAL_TABLET | Freq: Two times a day (BID) | ORAL | Status: DC
  Administered 2018-07-22 – 2018-07-23 (×2): 100 mg via ORAL
  Filled 2018-07-22 (×2): qty 1

## 2018-07-22 MED ORDER — INSULIN GLARGINE 100 UNIT/ML ~~LOC~~ SOLN
18.0000 [IU] | Freq: Every day | SUBCUTANEOUS | Status: DC
  Administered 2018-07-22: 18 [IU] via SUBCUTANEOUS
  Filled 2018-07-22 (×4): qty 0.18

## 2018-07-22 MED ORDER — ONDANSETRON HCL 4 MG/2ML IJ SOLN
INTRAMUSCULAR | Status: AC
  Filled 2018-07-22: qty 2

## 2018-07-22 MED ORDER — KCL IN DEXTROSE-NACL 20-5-0.45 MEQ/L-%-% IV SOLN
INTRAVENOUS | Status: DC
  Administered 2018-07-22: 17:00:00 via INTRAVENOUS
  Filled 2018-07-22: qty 1000

## 2018-07-22 MED ORDER — INSULIN ASPART 100 UNIT/ML ~~LOC~~ SOLN
0.0000 [IU] | Freq: Every day | SUBCUTANEOUS | Status: DC
  Administered 2018-07-22: 5 [IU] via SUBCUTANEOUS

## 2018-07-22 MED ORDER — FENTANYL CITRATE (PF) 250 MCG/5ML IJ SOLN
INTRAMUSCULAR | Status: AC
  Filled 2018-07-22: qty 5

## 2018-07-22 MED ORDER — CEFAZOLIN SODIUM-DEXTROSE 2-4 GM/100ML-% IV SOLN
2.0000 g | INTRAVENOUS | Status: AC
  Administered 2018-07-22: 2 g via INTRAVENOUS
  Filled 2018-07-22: qty 100

## 2018-07-22 MED ORDER — SUGAMMADEX SODIUM 200 MG/2ML IV SOLN
INTRAVENOUS | Status: DC | PRN
  Administered 2018-07-22: 350 mg via INTRAVENOUS

## 2018-07-22 MED ORDER — DEXAMETHASONE SODIUM PHOSPHATE 10 MG/ML IJ SOLN
INTRAMUSCULAR | Status: DC | PRN
  Administered 2018-07-22: 5 mg via INTRAVENOUS

## 2018-07-22 MED ORDER — HYDROMORPHONE HCL 1 MG/ML IJ SOLN: 0.5000 mg | INTRAMUSCULAR | Status: DC | PRN

## 2018-07-22 MED ORDER — LIDOCAINE 2% (20 MG/ML) 5 ML SYRINGE
INTRAMUSCULAR | Status: DC | PRN
  Administered 2018-07-22: 1.5 mg/kg/h via INTRAVENOUS

## 2018-07-22 MED ORDER — ROCURONIUM BROMIDE 10 MG/ML (PF) SYRINGE
PREFILLED_SYRINGE | INTRAVENOUS | Status: DC | PRN
  Administered 2018-07-22: 10 mg via INTRAVENOUS
  Administered 2018-07-22: 60 mg via INTRAVENOUS

## 2018-07-22 MED ORDER — ONDANSETRON HCL 4 MG/2ML IJ SOLN
INTRAMUSCULAR | Status: DC | PRN
  Administered 2018-07-22: 4 mg via INTRAVENOUS

## 2018-07-22 MED ORDER — HYDROMORPHONE HCL 1 MG/ML IJ SOLN
INTRAMUSCULAR | Status: AC
  Filled 2018-07-22: qty 2

## 2018-07-22 MED ORDER — DEXAMETHASONE SODIUM PHOSPHATE 4 MG/ML IJ SOLN: 4.0000 mg | INTRAMUSCULAR | Status: DC

## 2018-07-22 MED ORDER — LIDOCAINE 2% (20 MG/ML) 5 ML SYRINGE
INTRAMUSCULAR | Status: AC
  Filled 2018-07-22: qty 5

## 2018-07-22 MED ORDER — PROPOFOL 10 MG/ML IV BOLUS
INTRAVENOUS | Status: DC | PRN
  Administered 2018-07-22: 80 mg via INTRAVENOUS
  Administered 2018-07-22: 120 mg via INTRAVENOUS

## 2018-07-22 MED ORDER — INSULIN ASPART 100 UNIT/ML ~~LOC~~ SOLN: 0.0000 [IU] | Freq: Three times a day (TID) | SUBCUTANEOUS | Status: DC

## 2018-07-22 MED ORDER — ACETAMINOPHEN 500 MG PO TABS
1000.0000 mg | ORAL_TABLET | ORAL | Status: AC
  Administered 2018-07-22: 1000 mg via ORAL
  Filled 2018-07-22: qty 2

## 2018-07-22 MED ORDER — SODIUM CHLORIDE 0.9 % IV SOLN
INTRAVENOUS | Status: DC | PRN
  Administered 2018-07-22: 50 ug/min via INTRAVENOUS

## 2018-07-22 MED ORDER — FENTANYL CITRATE (PF) 100 MCG/2ML IJ SOLN
INTRAMUSCULAR | Status: DC | PRN
  Administered 2018-07-22 (×7): 50 ug via INTRAVENOUS

## 2018-07-22 MED ORDER — ENOXAPARIN SODIUM 40 MG/0.4ML ~~LOC~~ SOLN
40.0000 mg | SUBCUTANEOUS | Status: DC
  Administered 2018-07-23: 40 mg via SUBCUTANEOUS
  Filled 2018-07-22: qty 0.4

## 2018-07-22 MED ORDER — ONDANSETRON HCL 4 MG/2ML IJ SOLN: 4.0000 mg | Freq: Four times a day (QID) | INTRAMUSCULAR | Status: DC | PRN

## 2018-07-22 MED ORDER — ACETAMINOPHEN 500 MG PO TABS
1000.0000 mg | ORAL_TABLET | Freq: Four times a day (QID) | ORAL | Status: DC
  Administered 2018-07-22 – 2018-07-23 (×3): 1000 mg via ORAL
  Filled 2018-07-22 (×3): qty 2

## 2018-07-22 MED ORDER — LACTATED RINGERS IV SOLN
INTRAVENOUS | Status: DC
  Administered 2018-07-22: 09:00:00 via INTRAVENOUS

## 2018-07-22 MED ORDER — PHENYLEPHRINE 40 MCG/ML (10ML) SYRINGE FOR IV PUSH (FOR BLOOD PRESSURE SUPPORT)
PREFILLED_SYRINGE | INTRAVENOUS | Status: DC | PRN
  Administered 2018-07-22: 160 ug via INTRAVENOUS
  Administered 2018-07-22 (×2): 120 ug via INTRAVENOUS

## 2018-07-22 MED ORDER — SUGAMMADEX SODIUM 200 MG/2ML IV SOLN
INTRAVENOUS | Status: AC
  Filled 2018-07-22: qty 2

## 2018-07-22 MED ORDER — HYDROMORPHONE HCL 1 MG/ML IJ SOLN
0.2500 mg | INTRAMUSCULAR | Status: DC | PRN
  Administered 2018-07-22: 0.5 mg via INTRAVENOUS

## 2018-07-22 MED ORDER — ENOXAPARIN SODIUM 40 MG/0.4ML ~~LOC~~ SOLN
40.0000 mg | SUBCUTANEOUS | Status: AC
  Administered 2018-07-22: 40 mg via SUBCUTANEOUS
  Filled 2018-07-22: qty 0.4

## 2018-07-22 MED ORDER — GABAPENTIN 300 MG PO CAPS
300.0000 mg | ORAL_CAPSULE | ORAL | Status: AC
  Administered 2018-07-22: 300 mg via ORAL
  Filled 2018-07-22: qty 1

## 2018-07-22 MED ORDER — KETAMINE HCL 10 MG/ML IJ SOLN
INTRAMUSCULAR | Status: AC
  Filled 2018-07-22: qty 1

## 2018-07-22 MED ORDER — FENTANYL CITRATE (PF) 100 MCG/2ML IJ SOLN
INTRAMUSCULAR | Status: AC
  Filled 2018-07-22: qty 2

## 2018-07-22 MED ORDER — ARTIFICIAL TEARS OPHTHALMIC OINT
TOPICAL_OINTMENT | OPHTHALMIC | Status: AC
  Filled 2018-07-22: qty 3.5

## 2018-07-22 MED ORDER — OXYCODONE HCL 5 MG PO TABS
5.0000 mg | ORAL_TABLET | ORAL | Status: DC | PRN
  Administered 2018-07-22 – 2018-07-23 (×2): 5 mg via ORAL
  Filled 2018-07-22 (×2): qty 1

## 2018-07-22 MED ORDER — MIDAZOLAM HCL 2 MG/2ML IJ SOLN
INTRAMUSCULAR | Status: DC | PRN
  Administered 2018-07-22 (×2): 2 mg via INTRAVENOUS

## 2018-07-22 MED ORDER — ROCURONIUM BROMIDE 10 MG/ML (PF) SYRINGE
PREFILLED_SYRINGE | INTRAVENOUS | Status: AC
  Filled 2018-07-22: qty 10

## 2018-07-22 MED ORDER — SODIUM CHLORIDE 0.9 % IR SOLN
Status: DC | PRN
  Administered 2018-07-22: 1000 mL

## 2018-07-22 MED ORDER — MIDAZOLAM HCL 2 MG/2ML IJ SOLN
INTRAMUSCULAR | Status: AC
  Filled 2018-07-22: qty 2

## 2018-07-22 MED ORDER — LIDOCAINE 2% (20 MG/ML) 5 ML SYRINGE
INTRAMUSCULAR | Status: AC
  Filled 2018-07-22: qty 10

## 2018-07-22 MED ORDER — ONDANSETRON HCL 4 MG PO TABS: 4.0000 mg | ORAL_TABLET | Freq: Four times a day (QID) | ORAL | Status: DC | PRN

## 2018-07-22 MED ORDER — PROMETHAZINE HCL 25 MG/ML IJ SOLN: 6.2500 mg | INTRAMUSCULAR | Status: DC | PRN

## 2018-07-22 MED ORDER — LIDOCAINE 2% (20 MG/ML) 5 ML SYRINGE
INTRAMUSCULAR | Status: DC | PRN
  Administered 2018-07-22: 40 mg via INTRAVENOUS

## 2018-07-22 MED ORDER — KETAMINE HCL 10 MG/ML IJ SOLN
INTRAMUSCULAR | Status: DC | PRN
  Administered 2018-07-22: 30 mg via INTRAVENOUS
  Administered 2018-07-22: 20 mg via INTRAVENOUS

## 2018-07-22 SURGICAL SUPPLY — 53 items
ADH SKN CLS APL DERMABOND .7 (GAUZE/BANDAGES/DRESSINGS) ×1
AGENT HMST KT MTR STRL THRMB (HEMOSTASIS)
APL ESCP 34 STRL LF DISP (HEMOSTASIS)
APPLICATOR SURGIFLO ENDO (HEMOSTASIS) IMPLANT
BAG LAPAROSCOPIC 12 15 PORT 16 (BASKET) IMPLANT
BAG RETRIEVAL 12/15 (BASKET)
BAG SPEC RTRVL LRG 6X4 10 (ENDOMECHANICALS) ×1
COVER BACK TABLE 60X90IN (DRAPES) ×2 IMPLANT
COVER TIP SHEARS 8 DVNC (MISCELLANEOUS) ×1 IMPLANT
COVER TIP SHEARS 8MM DA VINCI (MISCELLANEOUS) ×1
DERMABOND ADVANCED (GAUZE/BANDAGES/DRESSINGS) ×1
DERMABOND ADVANCED .7 DNX12 (GAUZE/BANDAGES/DRESSINGS) ×1 IMPLANT
DRAPE ARM DVNC X/XI (DISPOSABLE) ×4 IMPLANT
DRAPE COLUMN DVNC XI (DISPOSABLE) ×1 IMPLANT
DRAPE DA VINCI XI ARM (DISPOSABLE) ×4
DRAPE DA VINCI XI COLUMN (DISPOSABLE) ×1
DRAPE SHEET LG 3/4 BI-LAMINATE (DRAPES) ×2 IMPLANT
DRAPE SURG IRRIG POUCH 19X23 (DRAPES) ×2 IMPLANT
ELECT REM PT RETURN 15FT ADLT (MISCELLANEOUS) ×2 IMPLANT
GLOVE BIO SURGEON STRL SZ 6 (GLOVE) ×6 IMPLANT
GLOVE BIO SURGEON STRL SZ 6.5 (GLOVE) ×1 IMPLANT
GOWN STRL REUS W/ TWL LRG LVL3 (GOWN DISPOSABLE) ×2 IMPLANT
GOWN STRL REUS W/TWL LRG LVL3 (GOWN DISPOSABLE) ×10
HOLDER FOLEY CATH W/STRAP (MISCELLANEOUS) ×2 IMPLANT
IRRIG SUCT STRYKERFLOW 2 WTIP (MISCELLANEOUS) ×2
IRRIGATION SUCT STRKRFLW 2 WTP (MISCELLANEOUS) ×1 IMPLANT
KIT PROCEDURE DA VINCI SI (MISCELLANEOUS) ×1
KIT PROCEDURE DVNC SI (MISCELLANEOUS) IMPLANT
MANIPULATOR UTERINE 4.5 ZUMI (MISCELLANEOUS) ×2 IMPLANT
NDL SPNL 18GX3.5 QUINCKE PK (NEEDLE) IMPLANT
NEEDLE SPNL 18GX3.5 QUINCKE PK (NEEDLE) ×2 IMPLANT
OBTURATOR OPTICAL STANDARD 8MM (TROCAR) ×1
OBTURATOR OPTICAL STND 8 DVNC (TROCAR) ×1
OBTURATOR OPTICALSTD 8 DVNC (TROCAR) ×1 IMPLANT
PACK ROBOT GYN CUSTOM WL (TRAY / TRAY PROCEDURE) ×2 IMPLANT
PAD POSITIONING PINK XL (MISCELLANEOUS) ×2 IMPLANT
PORT ACCESS TROCAR AIRSEAL 12 (TROCAR) ×1 IMPLANT
PORT ACCESS TROCAR AIRSEAL 5M (TROCAR) ×1
POUCH SPECIMEN RETRIEVAL 10MM (ENDOMECHANICALS) ×1 IMPLANT
SEAL CANN UNIV 5-8 DVNC XI (MISCELLANEOUS) ×4 IMPLANT
SEAL XI 5MM-8MM UNIVERSAL (MISCELLANEOUS) ×4
SET TRI-LUMEN FLTR TB AIRSEAL (TUBING) ×2 IMPLANT
SURGIFLO W/THROMBIN 8M KIT (HEMOSTASIS) IMPLANT
SUT VIC AB 0 CT1 27 (SUTURE) ×4
SUT VIC AB 0 CT1 27XBRD ANTBC (SUTURE) IMPLANT
SUT VIC AB 3-0 SH 27 (SUTURE) ×6
SUT VIC AB 3-0 SH 27XBRD (SUTURE) IMPLANT
SYR 10ML LL (SYRINGE) ×1 IMPLANT
TOWEL OR NON WOVEN STRL DISP B (DISPOSABLE) ×2 IMPLANT
TRAP SPECIMEN MUCOUS 40CC (MISCELLANEOUS) IMPLANT
TRAY FOLEY MTR SLVR 16FR STAT (SET/KITS/TRAYS/PACK) ×2 IMPLANT
UNDERPAD 30X30 (UNDERPADS AND DIAPERS) ×2 IMPLANT
WATER STERILE IRR 1000ML POUR (IV SOLUTION) ×2 IMPLANT

## 2018-07-22 NOTE — Anesthesia Preprocedure Evaluation (Addendum)
Anesthesia Evaluation  Patient identified by MRN, date of birth, ID band Patient awake    Reviewed: Allergy & Precautions, NPO status , Patient's Chart, lab work & pertinent test results, reviewed documented beta blocker date and time   Airway Mallampati: II  TM Distance: >3 FB Neck ROM: Full    Dental no notable dental hx. (+) Teeth Intact, Dental Advisory Given,    Pulmonary neg pulmonary ROS,    Pulmonary exam normal breath sounds clear to auscultation       Cardiovascular hypertension, Pt. on medications + CAD and + DOE  Normal cardiovascular exam Rhythm:Regular Rate:Normal  Left ventricle: The cavity size was normal. There was moderate   concentric hypertrophy. Systolic function was vigorous. The   estimated ejection fraction was in the range of 65% to 70%. Wall   motion was normal; there were no regional wall motion   abnormalities. Doppler parameters are consistent with abnormal   left ventricular relaxation (grade 1 diastolic dysfunction).   Doppler parameters are consistent with elevated ventricular   end-diastolic filling pressure. - Aortic valve: There was no regurgitation. - Aortic root: The aortic root was normal in size. - Mitral valve: There was trivial regurgitation. - Left atrium: The atrium was moderately dilated. - Right ventricle: Systolic function was normal. - Right atrium: The atrium was normal in size. - Pulmonary arteries: Systolic pressure was within the normal   range. - Inferior vena cava: The vessel was normal in size. - Pericardium, extracardiac: There was no pericardial effusion.    Neuro/Psych Anxiety negative neurological ROS  negative psych ROS   GI/Hepatic negative GI ROS, Neg liver ROS,   Endo/Other  diabetes, Type 2, Oral Hypoglycemic AgentsMorbid obesity  Renal/GU negative Renal ROS  negative genitourinary   Musculoskeletal negative musculoskeletal ROS (+) Arthritis , Gout    Abdominal (+) + obese,   Peds negative pediatric ROS (+)  Hematology  (+) anemia ,   Anesthesia Other Findings   Reproductive/Obstetrics negative OB ROS                           Anesthesia Physical Anesthesia Plan  ASA: III  Anesthesia Plan: General   Post-op Pain Management:    Induction: Intravenous  PONV Risk Score and Plan: 3 and Ondansetron, Dexamethasone, Treatment may vary due to age or medical condition and Scopolamine patch - Pre-op  Airway Management Planned: Oral ETT  Additional Equipment:   Intra-op Plan:   Post-operative Plan: Extubation in OR  Informed Consent: I have reviewed the patients History and Physical, chart, labs and discussed the procedure including the risks, benefits and alternatives for the proposed anesthesia with the patient or authorized representative who has indicated his/her understanding and acceptance.   Dental advisory given  Plan Discussed with: CRNA and Surgeon  Anesthesia Plan Comments:         Anesthesia Quick Evaluation

## 2018-07-22 NOTE — Anesthesia Procedure Notes (Signed)
Procedure Name: Intubation Date/Time: 07/22/2018 10:48 AM Performed by: Wanita Chamberlain, CRNA Pre-anesthesia Checklist: Patient identified, Emergency Drugs available, Suction available, Patient being monitored and Timeout performed Patient Re-evaluated:Patient Re-evaluated prior to induction Oxygen Delivery Method: Circle system utilized Preoxygenation: Pre-oxygenation with 100% oxygen Induction Type: IV induction Ventilation: Mask ventilation without difficulty Grade View: Grade II Tube type: Oral Tube size: 7.0 mm Number of attempts: 1 Airway Equipment and Method: Stylet Placement Confirmation: breath sounds checked- equal and bilateral,  CO2 detector,  positive ETCO2 and ETT inserted through vocal cords under direct vision Secured at: 22 cm Tube secured with: Tape Dental Injury: Teeth and Oropharynx as per pre-operative assessment

## 2018-07-22 NOTE — Interval H&P Note (Signed)
History and Physical Interval Note:  07/22/2018 10:12 AM  Madison Holden  has presented today for surgery, with the diagnosis of endometrial cancer  The various methods of treatment have been discussed with the patient and family. After consideration of risks, benefits and other options for treatment, the patient has consented to  Procedure(s): XI ROBOTIC ASSISTED TOTAL HYSTERECTOMY WITH BILATERAL SALPINGO OOPHORECTOMY AND SENTINAL LYMPH NODE BIOPSY (Bilateral) as a surgical intervention .  The patient's history has been reviewed, patient examined, no change in status, stable for surgery.  I have reviewed the patient's chart and labs.  Questions were answered to the patient's satisfaction.     Thereasa Solo

## 2018-07-22 NOTE — Op Note (Addendum)
OPERATIVE NOTE 07/22/18  Surgeon: Donaciano Eva   Assistants: Dr Lahoma Crocker (an MD assistant was necessary for tissue manipulation, management of robotic instrumentation, retraction and positioning due to the complexity of the case and hospital policies).   Anesthesia: General endotracheal anesthesia  ASA Class: 3   Pre-operative Diagnosis: endometrial cancer grade 1  Post-operative Diagnosis: same,   Operation: Robotic-assisted laparoscopic total hysterectomy with bilateral salpingoophorectomy, SLN biopsy   Surgeon: Donaciano Eva  Assistant Surgeon: Lahoma Crocker MD  Anesthesia: GET  Urine Output: 500  Operative Findings:  : 6cm uterus, normal tubes and ovaries, no suspicious nodes.   Estimated Blood Loss:  <20cc      Total IV Fluids: 700 ml         Specimens: uterus, cervix, bilateral tubes and ovaries, right external iliac SLN, left common iliac SLN         Complications:  None; patient tolerated the procedure well.         Disposition: PACU - hemodynamically stable.  Procedure Details  The patient was seen in the Holding Room. The risks, benefits, complications, treatment options, and expected outcomes were discussed with the patient.  The patient concurred with the proposed plan, giving informed consent.  The site of surgery properly noted/marked. The patient was identified as Madison Holden and the procedure verified as a Robotic-assisted hysterectomy with bilateral salpingo oophorectomy with SLN biopsy. A Time Out was held and the above information confirmed.  After induction of anesthesia, the patient was draped and prepped in the usual sterile manner. Pt was placed in supine position after anesthesia and draped and prepped in the usual sterile manner. The abdominal drape was placed after the CholoraPrep had been allowed to dry for 3 minutes.  Her arms were tucked to her side with all appropriate precautions.  The shoulders were stabilized  with padded shoulder blocks applied to the acromium processes.  The patient was placed in the semi-lithotomy position in [place].  The perineum was prepped with Betadine. The patient was then prepped. Foley catheter was placed.  A sterile speculum was placed in the vagina.  The cervix was grasped with a single-tooth tenaculum. 2mg  total of ICG was injected into the cervical stroma at 2 and 9 o'clock with 1cc injected at a 1cm and 86mm depth (concentration 0.5mg /ml) in all locations. The cervix was dilated with Kennon Rounds dilators.  The ZUMI uterine manipulator with a medium colpotomizer ring was placed without difficulty.  A pneum occluder balloon was placed over the manipulator.  OG tube placement was confirmed and to suction.   Next, a 5 mm skin incision was made 1 cm below the subcostal margin in the midclavicular line.  The 5 mm Optiview port and scope was used for direct entry.  Opening pressure was under 10 mm CO2.  The abdomen was insufflated and the findings were noted as above.   At this point and all points during the procedure, the patient's intra-abdominal pressure did not exceed 15 mmHg. Next, a 10 mm skin incision was made 2cm above the umbilicus and a right and left port was placed about 10 cm lateral to the robot port on the right and left side.  A fourth arm was placed in the left lower quadrant 2 cm above and superior and medial to the anterior superior iliac spine.  All ports were placed under direct visualization.  The patient was placed in steep Trendelenburg.  Bowel was folded away into the upper abdomen.  The robot was docked in the normal manner.  The right and left peritoneum were opened parallel to the IP ligament to open the retroperitoneal spaces bilaterally. The SLN mapping was performed in bilateral pelvic basins. The para rectal and paravesical spaces were opened up entirely with careful dissection below the level of the ureters bilaterally and to the depth of the uterine artery  origin in order to skeletonize the uterine "web" and ensure visualization of all parametrial channels. The para-aortic basins were carefully exposed and evaluated for isolated para-aortic SLN's. Lymphatic channels were identified travelling to the following visualized sentinel lymph node's: right external iliac SLN, and left common (medial) SLN. These SLN's were separated from their surrounding lymphatic tissue, removed and sent for permanent pathology.  The hysterectomy was started after the round ligament on the right side was incised and the retroperitoneum was entered and the pararectal space was developed.  The ureter was noted to be on the medial leaf of the broad ligament.  The peritoneum above the ureter was incised and stretched and the infundibulopelvic ligament was skeletonized, cauterized and cut.  The posterior peritoneum was taken down to the level of the KOH ring.  The anterior peritoneum was also taken down.  The bladder flap was created to the level of the KOH ring.  The uterine artery on the right side was skeletonized, cauterized and cut in the normal manner.  A similar procedure was performed on the left.  The colpotomy was made and the uterus, cervix, bilateral ovaries and tubes were amputated and delivered through the vagina.  Pedicles were inspected and excellent hemostasis was achieved.    The colpotomy at the vaginal cuff was closed with Vicryl on a CT1 needle in a running manner.  Irrigation was used and excellent hemostasis was achieved. There was an area of 3cm of thinned detrusor at the dome of the bladder where it had been attached to the uterus in the midline. THe detrusor was in tact. The detrusor was oversewn with 3-0 vicryl interrupted sutures.  At this point in the procedure was completed.  Robotic instruments were removed under direct visulaization.  The robot was undocked. The 10 mm ports were closed with Vicryl on a UR-5 needle and the fascia was closed with 0 Vicryl on a  UR-5 needle.  The skin was closed with 4-0 Vicryl in a subcuticular manner.  Dermabond was applied.  Sponge, lap and needle counts correct x 2.  The patient was taken to the recovery room in stable condition.  The vagina was swabbed with  minimal bleeding noted. The introitus was reapproximated at the midline posteriorally with 3-0 vicryl.   All instrument and needle counts were correct x  3.   The patient was transferred to the recovery room in a stable  condition.  Donaciano Eva, MD

## 2018-07-22 NOTE — Anesthesia Postprocedure Evaluation (Signed)
Anesthesia Post Note  Patient: Madison Holden [facility]  Procedure(s) Performed: XI ROBOTIC ASSISTED TOTAL HYSTERECTOMY WITH BILATERAL SALPINGO OOPHORECTOMY AND SENTINAL LYMPH NODE BIOPSY (Bilateral )     Patient location during evaluation: PACU Anesthesia Type: General Level of consciousness: awake and alert Pain management: pain level controlled Vital Signs Assessment: post-procedure vital signs reviewed and stable Respiratory status: spontaneous breathing, nonlabored ventilation, respiratory function stable and patient connected to nasal cannula oxygen Cardiovascular status: blood pressure returned to baseline and stable Postop Assessment: no apparent nausea or vomiting Anesthetic complications: no    Last Vitals:  Vitals:   07/22/18 1330 07/22/18 1345  BP: (!) 167/82 (!) 160/76  Pulse: 78 66  Resp: 15 (!) 22  Temp:    SpO2: 100% 100%    Last Pain:  Vitals:   07/22/18 1355  TempSrc:   PainSc: 6                  Maximiliano Cromartie S

## 2018-07-22 NOTE — Transfer of Care (Signed)
Immediate Anesthesia Transfer of Care Note  Patient: Madison Holden  Procedure(s) Performed: XI ROBOTIC ASSISTED TOTAL HYSTERECTOMY WITH BILATERAL SALPINGO OOPHORECTOMY AND SENTINAL LYMPH NODE BIOPSY (Bilateral )  Patient Location: PACU  Anesthesia Type:General  Level of Consciousness: awake, alert , oriented and patient cooperative  Airway & Oxygen Therapy: Patient Spontanous Breathing and Patient connected to face mask oxygen  Post-op Assessment: Report given to RN and Post -op Vital signs reviewed and stable  Post vital signs: Reviewed and stable  Last Vitals:  Vitals Value Taken Time  BP 154/74 07/22/2018 12:54 PM  Temp    Pulse 83 07/22/2018 12:55 PM  Resp 14 07/22/2018 12:55 PM  SpO2 100 % 07/22/2018 12:55 PM  Vitals shown include unvalidated device data.  Last Pain:  Vitals:   07/22/18 0843  TempSrc:   PainSc: 7       Patients Stated Pain Goal: 4 (06/05/47 1856)  Complications: No apparent anesthesia complications

## 2018-07-22 NOTE — Progress Notes (Signed)
Dr. Kalman Shan notified of pt platelet count 632, no new orders.

## 2018-07-22 NOTE — Progress Notes (Signed)
Patient refused to get out of bed and walk, she said she will do it tomorrow.

## 2018-07-23 ENCOUNTER — Other Ambulatory Visit: Payer: Self-pay

## 2018-07-23 ENCOUNTER — Encounter (HOSPITAL_COMMUNITY): Payer: Self-pay | Admitting: Gynecologic Oncology

## 2018-07-23 DIAGNOSIS — Z6841 Body Mass Index (BMI) 40.0 and over, adult: Secondary | ICD-10-CM | POA: Diagnosis not present

## 2018-07-23 DIAGNOSIS — E782 Mixed hyperlipidemia: Secondary | ICD-10-CM | POA: Diagnosis not present

## 2018-07-23 DIAGNOSIS — Z86010 Personal history of colonic polyps: Secondary | ICD-10-CM | POA: Diagnosis not present

## 2018-07-23 DIAGNOSIS — C541 Malignant neoplasm of endometrium: Secondary | ICD-10-CM | POA: Diagnosis not present

## 2018-07-23 DIAGNOSIS — F419 Anxiety disorder, unspecified: Secondary | ICD-10-CM | POA: Diagnosis not present

## 2018-07-23 DIAGNOSIS — R011 Cardiac murmur, unspecified: Secondary | ICD-10-CM | POA: Diagnosis not present

## 2018-07-23 DIAGNOSIS — Z7984 Long term (current) use of oral hypoglycemic drugs: Secondary | ICD-10-CM | POA: Diagnosis not present

## 2018-07-23 DIAGNOSIS — M19042 Primary osteoarthritis, left hand: Secondary | ICD-10-CM | POA: Diagnosis not present

## 2018-07-23 DIAGNOSIS — M109 Gout, unspecified: Secondary | ICD-10-CM | POA: Diagnosis not present

## 2018-07-23 DIAGNOSIS — K219 Gastro-esophageal reflux disease without esophagitis: Secondary | ICD-10-CM | POA: Diagnosis not present

## 2018-07-23 DIAGNOSIS — E119 Type 2 diabetes mellitus without complications: Secondary | ICD-10-CM | POA: Diagnosis not present

## 2018-07-23 DIAGNOSIS — I2510 Atherosclerotic heart disease of native coronary artery without angina pectoris: Secondary | ICD-10-CM | POA: Diagnosis not present

## 2018-07-23 DIAGNOSIS — I10 Essential (primary) hypertension: Secondary | ICD-10-CM | POA: Diagnosis not present

## 2018-07-23 DIAGNOSIS — Z79899 Other long term (current) drug therapy: Secondary | ICD-10-CM | POA: Diagnosis not present

## 2018-07-23 DIAGNOSIS — M19041 Primary osteoarthritis, right hand: Secondary | ICD-10-CM | POA: Diagnosis not present

## 2018-07-23 LAB — BASIC METABOLIC PANEL
Anion gap: 7 (ref 5–15)
BUN: 18 mg/dL (ref 8–23)
CALCIUM: 8.9 mg/dL (ref 8.9–10.3)
CHLORIDE: 104 mmol/L (ref 98–111)
CO2: 27 mmol/L (ref 22–32)
CREATININE: 0.95 mg/dL (ref 0.44–1.00)
GFR calc Af Amer: >60 mL/min (ref >60)
GFR calc non Af Amer: 60 mL/min — ABNORMAL LOW (ref >60)
GLUCOSE: 98 mg/dL (ref 70–99)
Potassium: 4.5 mmol/L (ref 3.5–5.1)
Sodium: 138 mmol/L (ref 135–145)

## 2018-07-23 LAB — CBC
HEMATOCRIT: 25.4 % — AB (ref 36.0–46.0)
HEMOGLOBIN: 7.8 g/dL — AB (ref 12.0–15.0)
MCH: 27.4 pg (ref 26.0–34.0)
MCHC: 30.7 g/dL (ref 30.0–36.0)
MCV: 89.1 fL (ref 78.0–100.0)
Platelets: 643 10*3/uL — ABNORMAL HIGH (ref 150–400)
RBC: 2.85 MIL/uL — ABNORMAL LOW (ref 3.87–5.11)
RDW: 19.2 % — AB (ref 11.5–15.5)
WBC: 9.6 10*3/uL (ref 4.0–10.5)

## 2018-07-23 LAB — GLUCOSE, CAPILLARY
Glucose-Capillary: 95 mg/dL (ref 70–99)
Glucose-Capillary: 79 mg/dL (ref 70–99)

## 2018-07-23 MED ORDER — OXYCODONE HCL 5 MG PO TABS: 5.0000 mg | ORAL_TABLET | ORAL | 0 refills | Status: DC | PRN

## 2018-07-23 MED ORDER — SENNOSIDES-DOCUSATE SODIUM 8.6-50 MG PO TABS: 1.0000 | ORAL_TABLET | Freq: Every day | ORAL | 1 refills | Status: DC

## 2018-07-23 MED ORDER — FERROUS GLUCONATE 324 (38 FE) MG PO TABS: 324.0000 mg | ORAL_TABLET | Freq: Two times a day (BID) | ORAL | 3 refills | Status: DC

## 2018-07-23 NOTE — Progress Notes (Signed)
Patient has ambulated for 10 minutes and been sitting in the chair for 60 minutes. Tolerating well.

## 2018-07-23 NOTE — Discharge Instructions (Signed)
07/22/2018  Return to work: 4 weeks  Activity: 1. Be up and out of the bed during the day.  Take a nap if needed.  You may walk up steps but be careful and use the hand rail.  Stair climbing will tire you more than you think, you may need to stop part way and rest.   2. No lifting or straining for 6 weeks.  3. No driving for 1 weeks.  Do Not drive if you are taking narcotic pain medicine.  4. Shower daily.  Use soap and water on your incision and pat dry; don't rub.   5. No sexual activity and nothing in the vagina for 8 weeks.  Medications:  - Take tylenol first line for pain control. Take these regularly (every 6 hours) to decrease the build up of pain.  - If necessary, for severe pain not relieved by tylenol, take oxycodone. DO NOT TAKE WITH HYDROCODONE.  - While taking oxycodone you should take sennakot every night to reduce the likelihood of constipation. If this causes diarrhea, stop its use.  -Begin taking ferrous gluconate twice daily with meals for anemia (low blood counts).  Diet: 1. Low sodium Heart Healthy Diet is recommended.  2. It is safe to use a laxative if you have difficulty moving your bowels.   Wound Care: 1. Keep clean and dry.  Shower daily.  Reasons to call the Doctor:   Fever - Oral temperature greater than 100.4 degrees Fahrenheit  Foul-smelling vaginal discharge  Difficulty urinating  Nausea and vomiting  Increased pain at the site of the incision that is unrelieved with pain medicine.  Difficulty breathing with or without chest pain  New calf pain especially if only on one side  Sudden, continuing increased vaginal bleeding with or without clots.   Follow-up: 1. See Everitt Amber in 3-4 weeks.  Contacts: For questions or concerns you should contact:  Dr. Everitt Amber at [phone] After hours and on week-ends call [phone] and ask to speak to the physician on call for Gynecologic Oncology  Oxycodone tablets or capsules What is  this medicine? OXYCODONE (ox i KOE done) is a pain reliever. It is used to treat moderate to severe pain. This medicine may be used for other purposes; ask your health care provider or pharmacist if you have questions. COMMON BRAND NAME(S): Dazidox, Endocodone, Oxaydo, OXECTA, OxyIR, Percolone, Roxicodone, ROXYBOND What should I tell my health care provider before I take this medicine? They need to know if you have any of these conditions: -Addison's disease -brain tumor -head injury -heart disease -history of drug or alcohol abuse problem -if you often drink alcohol -kidney disease -liver disease -lung or breathing disease, like asthma -mental illness -pancreatic disease -seizures -thyroid disease -an unusual or allergic reaction to oxycodone, codeine, hydrocodone, morphine, other medicines, foods, dyes, or preservatives -pregnant or trying to get pregnant -breast-feeding How should I use this medicine? Take this medicine by mouth with a glass of water. Follow the directions on the prescription label. You can take it with or without food. If it upsets your stomach, take it with food. Take your medicine at regular intervals. Do not take it more often than directed. Do not stop taking except on your doctor's advice. Some brands of this medicine, like Oxecta, have special instructions. Ask your doctor or pharmacist if these directions are for you: Do not cut, crush or chew this medicine. Swallow only one tablet at a time. Do not wet, soak, or lick  the tablet before you take it. A special MedGuide will be given to you by the pharmacist with each prescription and refill. Be sure to read this information carefully each time. Talk to your pediatrician regarding the use of this medicine in children. Special care may be needed. Overdosage: If you think you have taken too much of this medicine contact a poison control center or emergency room at once. NOTE: This medicine is only for you. Do not  share this medicine with others. What if I miss a dose? If you miss a dose, take it as soon as you can. If it is almost time for your next dose, take only that dose. Do not take double or extra doses. What may interact with this medicine? This medicine may interact with the following medications: -alcohol -antihistamines for allergy, cough and cold -antiviral medicines for HIV or AIDS -atropine -certain antibiotics like clarithromycin, erythromycin, linezolid, rifampin -certain medicines for anxiety or sleep -certain medicines for bladder problems like oxybutynin, tolterodine -certain medicines for depression like amitriptyline, fluoxetine, sertraline -certain medicines for fungal infections like ketoconazole, itraconazole, voriconazole -certain medicines for migraine headache like almotriptan, eletriptan, frovatriptan, naratriptan, rizatriptan, sumatriptan, zolmitriptan -certain medicines for nausea or vomiting like dolasetron, ondansetron, palonosetron -certain medicines for Parkinson's disease like benztropine, trihexyphenidyl -certain medicines for seizures like phenobarbital, phenytoin, primidone -certain medicines for stomach problems like dicyclomine, hyoscyamine -certain medicines for travel sickness like scopolamine -diuretics -general anesthetics like halothane, isoflurane, methoxyflurane, propofol -ipratropium -local anesthetics like lidocaine, pramoxine, tetracaine -MAOIs like Carbex, Eldepryl, Marplan, Nardil, and Parnate -medicines that relax muscles for surgery -methylene blue -nilotinib -other narcotic medicines for pain or cough -phenothiazines like chlorpromazine, mesoridazine, prochlorperazine, thioridazine This list may not describe all possible interactions. Give your health care provider a list of all the medicines, herbs, non-prescription drugs, or dietary supplements you use. Also tell them if you smoke, drink alcohol, or use illegal drugs. Some items may  interact with your medicine. What should I watch for while using this medicine? Tell your doctor or health care professional if your pain does not go away, if it gets worse, or if you have new or a different type of pain. You may develop tolerance to the medicine. Tolerance means that you will need a higher dose of the medicine for pain relief. Tolerance is normal and is expected if you take this medicine for a long time. Do not suddenly stop taking your medicine because you may develop a severe reaction. Your body becomes used to the medicine. This does NOT mean you are addicted. Addiction is a behavior related to getting and using a drug for a non-medical reason. If you have pain, you have a medical reason to take pain medicine. Your doctor will tell you how much medicine to take. If your doctor wants you to stop the medicine, the dose will be slowly lowered over time to avoid any side effects. There are different types of narcotic medicines (opiates). If you take more than one type at the same time or if you are taking another medicine that also causes drowsiness, you may have more side effects. Give your health care provider a list of all medicines you use. Your doctor will tell you how much medicine to take. Do not take more medicine than directed. Call emergency for help if you have problems breathing or unusual sleepiness. You may get drowsy or dizzy. Do not drive, use machinery, or do anything that needs mental alertness until you know how the medicine affects  you. Do not stand or sit up quickly, especially if you are an older patient. This reduces the risk of dizzy or fainting spells. Alcohol may interfere with the effect of this medicine. Avoid alcoholic drinks. This medicine will cause constipation. Try to have a bowel movement at least every 2 to 3 days. If you do not have a bowel movement for 3 days, call your doctor or health care professional. Your mouth may get dry. Chewing sugarless gum or  sucking hard candy, and drinking plenty of water may help. Contact your doctor if the problem does not go away or is severe. What side effects may I notice from receiving this medicine? Side effects that you should report to your doctor or health care professional as soon as possible: -allergic reactions like skin rash, itching or hives, swelling of the face, lips, or tongue -breathing problems -confusion -signs and symptoms of low blood pressure like dizziness; feeling faint or lightheaded, falls; unusually weak or tired -trouble passing urine or change in the amount of urine -trouble swallowing Side effects that usually do not require medical attention (report to your doctor or health care professional if they continue or are bothersome): -constipation -dry mouth -nausea, vomiting -tiredness This list may not describe all possible side effects. Call your doctor for medical advice about side effects. You may report side effects to FDA at 1-800-FDA-1088. Where should I keep my medicine? Keep out of the reach of children. This medicine can be abused. Keep your medicine in a safe place to protect it from theft. Do not share this medicine with anyone. Selling or giving away this medicine is dangerous and against the law. Store at room temperature between 15 and 30 degrees C (59 and 86 degrees F). Protect from light. Keep container tightly closed. This medicine may cause accidental overdose and death if it is taken by other adults, children, or pets. Flush any unused medicine down the toilet to reduce the chance of harm. Do not use the medicine after the expiration date. NOTE: This sheet is a summary. It may not cover all possible information. If you have questions about this medicine, talk to your doctor, pharmacist, or health care provider.  2018 Elsevier/Gold Standard (2015-08-23 16:55:57)

## 2018-07-23 NOTE — Progress Notes (Signed)
Patient refused to get up to ambulate. Stating she wanted to wait for her husband to get back.

## 2018-07-23 NOTE — Discharge Summary (Signed)
Physician Discharge Summary  Patient ID: Madison Holden MRN: [id number] DOB/AGE: 1946/03/12 72 y.o.  Admit date: 07/22/2018 Discharge date: 07/23/2018  Admission Diagnoses: Endometrial carcinoma [facility])  Discharge Diagnoses:  Principal Problem:   Endometrial carcinoma ([place]) Active Problems:   Endometrial cancer [facility])   Discharged Condition:  The patient is in good condition and stable for discharge.    Hospital Course: On 07/22/2018, the patient underwent the following: Procedure(s): XI ROBOTIC ASSISTED TOTAL HYSTERECTOMY WITH BILATERAL SALPINGO OOPHORECTOMY AND SENTINAL LYMPH NODE BIOPSY.  The postoperative course was uneventful.  She was discharged to home on postoperative day 1 tolerating a regular diet, ambulating, voiding, pain controlled, passing flatus.  Consults: None  Significant Diagnostic Studies: None  Treatments: surgery: see above  Discharge Exam: Blood pressure 137/69, pulse 78, temperature 98.6 F (37 C), temperature source Oral, resp. rate 18, height 4\' 10"  (1.473 m), weight 199 lb (90.3 kg), SpO2 99 %. General appearance: alert, cooperative and no distress Resp: clear to auscultation bilaterally Cardio: regular rate and rhythm, S1, S2 normal, no murmur, click, rub or gallop GI: soft, non-tender; bowel sounds normal; no masses,  no organomegaly Extremities: extremities normal, atraumatic, no cyanosis or edema Incision/Wound: Lap sites to the abdomen with dermabond without erythema or drainage  Disposition: Discharge disposition: 01-Home or Self Care       Discharge Instructions    Call MD for:  difficulty breathing, headache or visual disturbances   Complete by:  As directed    Call MD for:  extreme fatigue   Complete by:  As directed    Call MD for:  hives   Complete by:  As directed    Call MD for:  persistant dizziness or light-headedness   Complete by:  As directed    Call MD for:  persistant nausea and vomiting   Complete by:  As directed     Call MD for:  redness, tenderness, or signs of infection (pain, swelling, redness, odor or green/yellow discharge around incision site)   Complete by:  As directed    Call MD for:  severe uncontrolled pain   Complete by:  As directed    Call MD for:  temperature >100.4   Complete by:  As directed    Diet - low sodium heart healthy   Complete by:  As directed    Driving Restrictions   Complete by:  As directed    No driving for 1 week.  Do not take narcotics and drive.   Increase activity slowly   Complete by:  As directed    Lifting restrictions   Complete by:  As directed    No lifting greater than 10 lbs.   Sexual Activity Restrictions   Complete by:  As directed    No sexual activity, nothing in the vagina, for 8 weeks.     Allergies as of 07/23/2018   No Known Allergies     Medication List    STOP taking these medications   HYDROcodone-acetaminophen 5-325 MG tablet Commonly known as:  NORCO/VICODIN   IMODIUM A-D PO     TAKE these medications   allopurinol 100 MG tablet Commonly known as:  ZYLOPRIM Take 100 mg by mouth 2 (two) times daily. For Gout   diphenhydrAMINE 25 MG tablet Commonly known as:  BENADRYL Take 25 mg by mouth at bedtime as needed for sleep.   esomeprazole 20 MG capsule Commonly known as:  NEXIUM Take 20 mg by mouth 3 (three) times a week.  ferrous gluconate 324 MG tablet Commonly known as:  FERGON Take 1 tablet (324 mg total) by mouth 2 (two) times daily with a meal.   Magnesium 500 MG Tabs Take 500 mg by mouth every other day.   Melatonin 3 MG Tabs Take 3-6 mg by mouth at bedtime as needed (sleep).   metFORMIN 1000 MG tablet Commonly known as:  GLUCOPHAGE Take 1,000 mg by mouth 2 (two) times daily with a meal.   oxybutynin 5 MG tablet Commonly known as:  DITROPAN Take 5 mg by mouth daily. May take a second 5 mg dose as needed for bladder spasms   oxyCODONE 5 MG immediate release tablet Commonly known as:  Oxy  IR/ROXICODONE Take 1 tablet (5 mg total) by mouth every 4 (four) hours as needed for severe pain (post-op surgical pain). Do not take with hydrocodone   pioglitazone 15 MG tablet Commonly known as:  ACTOS Take 7.5 mg by mouth daily.   senna-docusate 8.6-50 MG tablet Commonly known as:  Senokot-S Take 1 tablet by mouth at bedtime. Do not take if having loose stools   SYSTANE OP Place 1 drop into both eyes daily as needed (dry eyes).   vitamin C 500 MG tablet Commonly known as:  ASCORBIC ACID Take 500 mg by mouth 4 (four) times a week.      Follow-up Information    Everitt Amber, MD Follow up on 08/01/2018.   Specialty:  Gynecologic Oncology Why:  at 12pm at the [facility] information: [address] [place] [address] [phone]           Greater than thirty minutes were spend for face to face discharge instructions and discharge orders/summary in EPIC.   Signed: Dorothyann Gibbs 07/23/2018, 8:38 AM

## 2018-07-24 ENCOUNTER — Telehealth: Payer: Self-pay

## 2018-07-24 NOTE — Telephone Encounter (Signed)
[name] called stating that she has had to use the OxyIr 5 mg tabs q 4 hrs. ATC the last 24 hrs.  She was given 12 tablets on 07-23-18 and has 7 left. She is concerned that she may not have enough to get through the weekend. Told her she needs to regularly alternate tylenol or Ibuprofen with the Oxy Ir. Melissa will sent in 10 more tablets.  She will not recive anymore post op. She needs to call her rheumatologist with whom  she receives hydrocodone from with a contract  For her pain issues. Pt verbalized understanding.

## 2018-07-25 ENCOUNTER — Other Ambulatory Visit: Payer: Self-pay | Admitting: Gynecologic Oncology

## 2018-07-25 ENCOUNTER — Telehealth: Payer: Self-pay | Admitting: Oncology

## 2018-07-25 ENCOUNTER — Telehealth: Payer: Self-pay | Admitting: Gynecologic Oncology

## 2018-07-25 DIAGNOSIS — C541 Malignant neoplasm of endometrium: Secondary | ICD-10-CM

## 2018-07-25 MED ORDER — OXYCODONE HCL 5 MG PO TABS: 5.0000 mg | ORAL_TABLET | Freq: Four times a day (QID) | ORAL | 0 refills | Status: DC | PRN

## 2018-07-25 NOTE — Progress Notes (Signed)
See RN note.  Patient called stating she is concerned about running out of oxycodone over the weekend.  She is taking it for pain every four hours.  Advised RN to tell the patient that since she receives hydrocodone from her rheumatologist that she would only be given another prescription of enough medication to cover her over the weekend.  Advised to contact her rheumatologist today to get an appointment to discuss her pain related to her gout.  She is not taking the hydrocodone now but states when she has a gout flare up, it does not assist with the pain.  Advised she needs to be alternating with extra strength tylenol as well since she would not be on the oxycodone for an extended period of time.

## 2018-07-25 NOTE — Telephone Encounter (Signed)
Patient informed of final path results.  No concerns voiced.  Advised to call for any needs.  All questions answered.

## 2018-07-25 NOTE — Telephone Encounter (Signed)
Called Madison Holden about her request for Ambien. Advised her that we are concerned about sedation since she is taking oxycodone.  Discussed that she can try tylenol PM over the counter.  She verbalized understanding and agreement.

## 2018-08-01 ENCOUNTER — Encounter: Payer: Self-pay | Admitting: Gynecologic Oncology

## 2018-08-01 ENCOUNTER — Inpatient Hospital Stay: Payer: Medicare Other | Attending: Gynecology | Admitting: Gynecologic Oncology

## 2018-08-01 VITALS — BP 150/82 | HR 90 | Temp 97.4°F | Resp 20 | Ht <= 58 in | Wt 196.0 lb

## 2018-08-01 DIAGNOSIS — C541 Malignant neoplasm of endometrium: Secondary | ICD-10-CM

## 2018-08-01 DIAGNOSIS — Z7189 Other specified counseling: Secondary | ICD-10-CM

## 2018-08-01 DIAGNOSIS — Z90710 Acquired absence of both cervix and uterus: Secondary | ICD-10-CM

## 2018-08-01 DIAGNOSIS — Z90722 Acquired absence of ovaries, bilateral: Secondary | ICD-10-CM | POA: Diagnosis not present

## 2018-08-01 NOTE — Patient Instructions (Addendum)
Please notify Dr Denman George at phone number [phone] if you notice vaginal bleeding, new pelvic or abdominal pains, bloating, feeling full easy, or a change in bladder or bowel function.   Please continue taking her iron tablets for 3 months. Your primary care doctor will check your hemoglobin and determine when it is safe to stop this.  Your cancer was a stage 1 cancer and no treatment is recommended, though you are advised to see Dr Denman George once a year and Dr Philis Pique once a year for check ups.  Avoid heavy lifting for 4 weeks postop and avoid intercourse for 19months.  Call Dr Serita Grit number at the above number in the new year to schedule follow-up with her in April, 2020.

## 2018-08-01 NOTE — Progress Notes (Signed)
Follow-up Note: Gyn-Onc   Madison Holden 72 y.o. female  Chief Complaint  Patient presents with  . 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  Plan: Pathology revealed low risk factors for recurrence, therefore no adjuvant therapy is recommended according to NCCN guidelines.  I discussed risk for recurrence and typical symptoms encouraged her to notify us of these should they develop between visits.  I recommend she have follow-up every 6 months for 5 years in accordance with NCCN guidelines. Those visits should include symptom assessment, physical exam and pelvic examination. Pap smears are not indicated or recommended in the routine surveillance of endometrial cancer.  She will follow up with me in 6 months and in 12 months with Dr Philis Pique.   HPI: 72 year old white married female seen in consultation at the request of Dr. Philis Pique regarding management of a newly diagnosed endometrial cancer.  Patient had a routine Pap smear that showed atypical glandular cells.  Cervix was stenotic requiring the patient undergo a D&C which revealed a polypoid lesion in the endometrial cavity.  Final pathology showed this to be a well-differentiated endometrial carcinoma.  Uterus is normal size (sounded 7 cm).  Patient denies any postmenopausal bleeding or discharge.  She has no pelvic pain pressure and has no GI or GU symptoms.  She denies any past gynecologic history.  She has a number of medical problems listed above.  Most recently she has had a bout of gout causing fair amount of pain in her arms hands and legs.  Interval Hx: At her preop visit she was noted to have hyperkalemia (7.2) and AKI. She was admitted and determined to have acute on chronic kidney injury from NSAID use in the setting of a gout flare. Supportive therapy was instituted and she recovered normalizing her labs (normal potassium at recheck on  05/15/18).   On 10.1.19 she underwent robotic assisted total hysterectomy, BSO, and bilateral SLN biopsy. Findings showed a grade 1 cancer with no myo invasion, no LVSI, negative cervical or nodal involvmement. She was staged as stage IA grade 1 endometrioid adenocarcinoma. MSI testing was stable/MMR normal. She was determined to have low risk disease and therefore no adjuvant therapy was prescribed in accordance with NCCN guidelines.   Review of Systems:10 point review of systems is negative except as noted in interval history.   Vitals: Blood pressure (!) 150/82, pulse 90, temperature (!) 97.4 F (36.3 C), temperature source Oral, resp. rate 20, height _0  (1.473 m), weight 196 lb (88.9 kg), SpO2 95 %.  Physical Exam: General : The patient is a healthy woman in no acute distress.  HEENT: normocephalic, extraoccular movements normal; neck is supple without thyromegally  Lynphnodes: Supraclavicular and inguinal nodes not enlarged  Abdomen: Soft, non-tender, no ascites, no organomegally, no masses, no hernias, well healed incisions. Pelvic:  Vaginal cuff healing normally, no blood, no lesions, no defects    Lower extremities: No edema or varicosities. Normal range of motion      No Known Allergies  Past Medical History:  Diagnosis Date  . Ambulates with cane    due to gout   . Arthritis    hands  . Chest pain, unspecified 07-01-2018  per pt had 2 day chest pain and sob  6 weeks ago,, per pt no symptoms since   cardiology-- dr Salley Scarlet--  scheduled for stress test and echo 07-03-2018 prior to surgery scheduled 07-08-2018  . Diarrhea,  unspecified    intermittant  . DOE (dyspnea on exertion)   . Endometrial cancer ([place])   . Essential hypertension   . Frequency of urination   . GERD (gastroesophageal reflux disease)   . Gout rheumologist-- michelle young PA ([facility])   07-01-2018 per pt flare up april 2019 , hands and wrists  . Heart murmur   . History of  adenomatous polyp of colon   . History of esophageal dilatation    for stricture  . Mild nausea    per pt intermittant , no vomiting  . Mixed dyslipidemia   . Muscle weakness of extremity    bilateral upper and lower extremities due to gout  . Poor appetite    per pt   . Type 2 diabetes mellitus ([place])    followed by pcp  . Urgency of urination   . Urine frequency     Past Surgical History:  Procedure Laterality Date  . BREAST CYST EXCISION Right 1974   benign per pt  . CATARACT EXTRACTION W/ INTRAOCULAR LENS  IMPLANT, BILATERAL  2014;  2017  . MOUTH SURGERY  11/2017   Upper quadrants,  per pt peridontist did laser procedure on gums  . ROBOTIC ASSISTED TOTAL HYSTERECTOMY WITH BILATERAL SALPINGO OOPHERECTOMY Bilateral 07/22/2018   Procedure: XI ROBOTIC ASSISTED TOTAL HYSTERECTOMY WITH BILATERAL SALPINGO OOPHORECTOMY AND SENTINAL LYMPH NODE BIOPSY;  Surgeon: Everitt Amber, MD;  Location: [facility];  Service: Gynecology;  Laterality: Bilateral;  . TONSILLECTOMY  child    Current Outpatient Medications  Medication Sig Dispense Refill  . allopurinol (ZYLOPRIM) 100 MG tablet Take 100 mg by mouth 2 (two) times daily. For Gout     . diphenhydrAMINE (BENADRYL) 25 MG tablet Take 25 mg by mouth at bedtime as needed for sleep.     Marland Kitchen esomeprazole (NEXIUM) 20 MG capsule Take 20 mg by mouth 3 (three) times a week.    . ferrous gluconate (FERGON) 324 MG tablet Take 1 tablet (324 mg total) by mouth 2 (two) times daily with a meal. 60 tablet 3  . Magnesium 500 MG TABS Take 500 mg by mouth every other day.    . Melatonin 3 MG TABS Take 3-6 mg by mouth at bedtime as needed (sleep).     . metFORMIN (GLUCOPHAGE) 1000 MG tablet Take 1,000 mg by mouth 2 (two) times daily with a meal.    . oxybutynin (DITROPAN) 5 MG tablet Take 5 mg by mouth daily. May take a second 5 mg dose as needed for bladder spasms    . pioglitazone (ACTOS) 15 MG tablet Take 7.5 mg by mouth daily.    . vitamin C (ASCORBIC ACID) 500 MG  tablet Take 500 mg by mouth 4 (four) times a week.     No current facility-administered medications for this visit.     Social History   Socioeconomic History  . Marital status: Married    Spouse name: Not on file  . Number of children: 0  . Years of education: Not on file  . Highest education level: Not on file  Occupational History  . Occupation: retired  Scientific laboratory technician  . Financial resource strain: Not on file  . Food insecurity:    Worry: Not on file    Inability: Not on file  . Transportation needs:    Medical: Not on file    Non-medical: Not on file  Tobacco Use  . Smoking status: Never Smoker  . Smokeless tobacco: Never Used  Substance  and Sexual Activity  . Alcohol use: Not Currently    Comment: 2-3 per  week, not currently drinking since Apri; 2019  . Drug use: No  . Sexual activity: Not on file  Lifestyle  . Physical activity:    Days per week: Not on file    Minutes per session: Not on file  . Stress: Not on file  Relationships  . Social connections:    Talks on phone: Not on file    Gets together: Not on file    Attends religious service: Not on file    Active member of club or organization: Not on file    Attends meetings of clubs or organizations: Not on file    Relationship status: Not on file  . Intimate partner violence:    Fear of current or ex partner: Not on file    Emotionally abused: Not on file    Physically abused: Not on file    Forced sexual activity: Not on file  Other Topics Concern  . Not on file  Social History Narrative   Has never smoked. Daily Caffeine Use- 2 cups per day. Does not get regular exercise. Retired.     Family History  Problem Relation Age of Onset  . Heart disease Mother   . Heart failure Mother   . Heart disease Father   . Prostate cancer Father   . Clotting disorder Father   . Diabetes Maternal Grandfather   . Colon cancer Neg Hx     30 minutes of direct face to face counseling time was spent with the  patient. This included discussion about prognosis, therapy recommendations and postoperative side effects and are beyond the scope of routine postoperative care.    Thereasa Solo, MD 08/01/2018, 12:30 PM

## 2018-08-07 DIAGNOSIS — M1A09X0 Idiopathic chronic gout, multiple sites, without tophus (tophi): Secondary | ICD-10-CM | POA: Diagnosis not present

## 2018-08-07 DIAGNOSIS — R5382 Chronic fatigue, unspecified: Secondary | ICD-10-CM | POA: Diagnosis not present

## 2018-08-07 DIAGNOSIS — M2550 Pain in unspecified joint: Secondary | ICD-10-CM | POA: Diagnosis not present

## 2018-08-08 DIAGNOSIS — R601 Generalized edema: Secondary | ICD-10-CM | POA: Diagnosis not present

## 2018-08-08 DIAGNOSIS — E7800 Pure hypercholesterolemia, unspecified: Secondary | ICD-10-CM | POA: Diagnosis not present

## 2018-08-08 DIAGNOSIS — I10 Essential (primary) hypertension: Secondary | ICD-10-CM | POA: Diagnosis not present

## 2018-08-08 DIAGNOSIS — R609 Edema, unspecified: Secondary | ICD-10-CM | POA: Diagnosis not present

## 2018-08-08 DIAGNOSIS — E11319 Type 2 diabetes mellitus with unspecified diabetic retinopathy without macular edema: Secondary | ICD-10-CM | POA: Diagnosis not present

## 2018-08-11 ENCOUNTER — Other Ambulatory Visit (HOSPITAL_BASED_OUTPATIENT_CLINIC_OR_DEPARTMENT_OTHER): Payer: Self-pay | Admitting: Physician Assistant

## 2018-08-11 DIAGNOSIS — R609 Edema, unspecified: Secondary | ICD-10-CM

## 2018-09-04 ENCOUNTER — Ambulatory Visit (HOSPITAL_BASED_OUTPATIENT_CLINIC_OR_DEPARTMENT_OTHER)
Admission: RE | Admit: 2018-09-04 | Discharge: 2018-09-04 | Disposition: A | Discharge status: Home / Self Care | Payer: Medicare Other | Source: Ambulatory Visit | Attending: Physician Assistant | Admitting: Physician Assistant

## 2018-09-04 DIAGNOSIS — R600 Localized edema: Secondary | ICD-10-CM | POA: Diagnosis not present

## 2018-09-04 DIAGNOSIS — R609 Edema, unspecified: Secondary | ICD-10-CM | POA: Insufficient documentation

## 2018-09-29 DIAGNOSIS — M2550 Pain in unspecified joint: Secondary | ICD-10-CM | POA: Diagnosis not present

## 2018-09-29 DIAGNOSIS — Z6839 Body mass index (BMI) 39.0-39.9, adult: Secondary | ICD-10-CM | POA: Diagnosis not present

## 2018-09-29 DIAGNOSIS — M1A09X0 Idiopathic chronic gout, multiple sites, without tophus (tophi): Secondary | ICD-10-CM | POA: Diagnosis not present

## 2018-09-29 DIAGNOSIS — E669 Obesity, unspecified: Secondary | ICD-10-CM | POA: Diagnosis not present

## 2018-11-06 DIAGNOSIS — Z719 Counseling, unspecified: Secondary | ICD-10-CM | POA: Diagnosis not present

## 2018-11-06 DIAGNOSIS — M2550 Pain in unspecified joint: Secondary | ICD-10-CM | POA: Diagnosis not present

## 2018-11-06 DIAGNOSIS — M1A09X0 Idiopathic chronic gout, multiple sites, without tophus (tophi): Secondary | ICD-10-CM | POA: Diagnosis not present

## 2018-11-06 DIAGNOSIS — M0579 Rheumatoid arthritis with rheumatoid factor of multiple sites without organ or systems involvement: Secondary | ICD-10-CM | POA: Diagnosis not present

## 2018-11-17 ENCOUNTER — Other Ambulatory Visit (HOSPITAL_BASED_OUTPATIENT_CLINIC_OR_DEPARTMENT_OTHER): Payer: Self-pay | Admitting: Physician Assistant

## 2018-11-17 DIAGNOSIS — R918 Other nonspecific abnormal finding of lung field: Secondary | ICD-10-CM

## 2018-11-20 ENCOUNTER — Ambulatory Visit (HOSPITAL_BASED_OUTPATIENT_CLINIC_OR_DEPARTMENT_OTHER)
Admission: RE | Admit: 2018-11-20 | Discharge: 2018-11-20 | Disposition: A | Discharge status: Home / Self Care | Payer: Medicare Other | Source: Ambulatory Visit | Attending: Physician Assistant | Admitting: Physician Assistant

## 2018-11-20 ENCOUNTER — Encounter (HOSPITAL_BASED_OUTPATIENT_CLINIC_OR_DEPARTMENT_OTHER): Payer: Self-pay

## 2018-11-20 DIAGNOSIS — J984 Other disorders of lung: Secondary | ICD-10-CM | POA: Diagnosis not present

## 2018-11-20 DIAGNOSIS — R918 Other nonspecific abnormal finding of lung field: Secondary | ICD-10-CM | POA: Diagnosis not present

## 2018-11-20 MED ORDER — IOPAMIDOL (ISOVUE-300) INJECTION 61%
100.0000 mL | Freq: Once | INTRAVENOUS | Status: AC | PRN
  Administered 2018-11-20: 80 mL via INTRAVENOUS

## 2018-11-28 ENCOUNTER — Telehealth: Payer: Self-pay | Admitting: Cardiology

## 2018-11-28 NOTE — Telephone Encounter (Signed)
New message   Patient would like to be released from Dr. Geraldo Pitter and now See Dr. Harrington Challenger. Please advise.

## 2018-11-28 NOTE — Telephone Encounter (Signed)
I am OK with switch °

## 2018-11-28 NOTE — Telephone Encounter (Signed)
Fine. She has seen Dr Harrington Challenger in the past

## 2018-12-10 DIAGNOSIS — Z719 Counseling, unspecified: Secondary | ICD-10-CM | POA: Diagnosis not present

## 2018-12-10 DIAGNOSIS — M0579 Rheumatoid arthritis with rheumatoid factor of multiple sites without organ or systems involvement: Secondary | ICD-10-CM | POA: Diagnosis not present

## 2018-12-10 DIAGNOSIS — M1A09X0 Idiopathic chronic gout, multiple sites, without tophus (tophi): Secondary | ICD-10-CM | POA: Diagnosis not present

## 2018-12-10 DIAGNOSIS — M2550 Pain in unspecified joint: Secondary | ICD-10-CM | POA: Diagnosis not present

## 2018-12-31 DIAGNOSIS — N179 Acute kidney failure, unspecified: Secondary | ICD-10-CM | POA: Diagnosis not present

## 2018-12-31 DIAGNOSIS — E875 Hyperkalemia: Secondary | ICD-10-CM | POA: Diagnosis not present

## 2018-12-31 DIAGNOSIS — M1A09X0 Idiopathic chronic gout, multiple sites, without tophus (tophi): Secondary | ICD-10-CM | POA: Diagnosis not present

## 2018-12-31 DIAGNOSIS — I10 Essential (primary) hypertension: Secondary | ICD-10-CM | POA: Diagnosis not present

## 2019-01-06 ENCOUNTER — Other Ambulatory Visit (HOSPITAL_COMMUNITY): Payer: Self-pay | Admitting: Gynecologic Oncology

## 2019-01-16 DIAGNOSIS — M0579 Rheumatoid arthritis with rheumatoid factor of multiple sites without organ or systems involvement: Secondary | ICD-10-CM | POA: Diagnosis not present

## 2019-01-16 DIAGNOSIS — M1A09X0 Idiopathic chronic gout, multiple sites, without tophus (tophi): Secondary | ICD-10-CM | POA: Diagnosis not present

## 2019-01-16 DIAGNOSIS — Z719 Counseling, unspecified: Secondary | ICD-10-CM | POA: Diagnosis not present

## 2019-01-16 DIAGNOSIS — E669 Obesity, unspecified: Secondary | ICD-10-CM | POA: Diagnosis not present

## 2019-01-27 ENCOUNTER — Telehealth: Payer: Self-pay

## 2019-01-27 NOTE — Telephone Encounter (Signed)
Follow up   Patient states that she needs to add to her medication list that was discussed. The medications are: Allopurinol 300 mg daily, Hydrocodone 5/325 mg one every 6 hours, methotrexate .9 weekly, folic acid 009 mg.

## 2019-01-27 NOTE — Telephone Encounter (Signed)
   Pt does not have smart phone or Internet access at this time. Agreeable to PHONE visit. Pts med list reviewed.   Virtual Visit Pre-Appointment Phone Call  Steps For Call:  1. Confirm consent - "In the setting of the current Covid19 crisis, you are scheduled for a PHONE visit with your provider on 4/17 at 9:20am.  Just as we do with many in-office visits, in order for you to participate in this visit, we must obtain consent.  If you'd like, I can send this to your mychart (if signed up) or email for you to review.  Otherwise, I can obtain your verbal consent now.  All virtual visits are billed to your insurance company just like a normal visit would be.  By agreeing to a virtual visit, we'd like you to understand that the technology does not allow for your provider to perform an examination, and thus may limit your provider's ability to fully assess your condition.  Finally, though the technology is pretty good, we cannot assure that it will always work on either your or our end, and in the setting of a video visit, we may have to convert it to a phone-only visit.  In either situation, we cannot ensure that we have a secure connection.  Are you willing to proceed?"  2. Give patient instructions for WebEx download to smartphone as below if video visit  3. Advise patient to be prepared with any vital sign or heart rhythm information, their current medicines, and a piece of paper and pen handy for any instructions they may receive the day of their visit  4. Inform patient they will receive a phone call 15 minutes prior to their appointment time (may be from unknown caller ID) so they should be prepared to answer  5. Confirm that appointment type is correct in Epic appointment notes (video vs telephone)    TELEPHONE CALL NOTE  Madison Holden has been deemed a candidate for a follow-up tele-health visit to limit community exposure during the Covid-19 pandemic. I spoke with the patient via phone  to ensure availability of phone/video source, confirm preferred email & phone number, and discuss instructions and expectations.  I reminded Madison Holden to be prepared with any vital sign and/or heart rhythm information that could potentially be obtained via home monitoring, at the time of her visit. I reminded Madison Holden to expect a phone call at the time of her visit if her visit.  Did the patient verbally acknowledge consent to treatment? YES  Wilma Flavin, RN 01/27/2019 4:23 PM

## 2019-02-06 ENCOUNTER — Telehealth: Payer: Self-pay | Admitting: Internal Medicine

## 2019-02-06 ENCOUNTER — Other Ambulatory Visit: Payer: Self-pay

## 2019-02-06 ENCOUNTER — Telehealth (INDEPENDENT_AMBULATORY_CARE_PROVIDER_SITE_OTHER): Payer: Medicare Other | Admitting: Internal Medicine

## 2019-02-06 ENCOUNTER — Encounter: Payer: Self-pay | Admitting: Internal Medicine

## 2019-02-06 VITALS — BP 163/97 | HR 89 | Ht <= 58 in | Wt 188.0 lb

## 2019-02-06 DIAGNOSIS — I10 Essential (primary) hypertension: Secondary | ICD-10-CM

## 2019-02-06 DIAGNOSIS — E785 Hyperlipidemia, unspecified: Secondary | ICD-10-CM

## 2019-02-06 DIAGNOSIS — E119 Type 2 diabetes mellitus without complications: Secondary | ICD-10-CM | POA: Diagnosis not present

## 2019-02-06 DIAGNOSIS — E782 Mixed hyperlipidemia: Secondary | ICD-10-CM

## 2019-02-06 DIAGNOSIS — I2510 Atherosclerotic heart disease of native coronary artery without angina pectoris: Secondary | ICD-10-CM | POA: Diagnosis not present

## 2019-02-06 MED ORDER — AMLODIPINE BESYLATE 2.5 MG PO TABS: 2.5000 mg | ORAL_TABLET | Freq: Every day | ORAL | 3 refills | Status: DC

## 2019-02-06 NOTE — Progress Notes (Signed)
Telephone visit  Evaluation Performed:  Follow-up visit  This visit type was conducted due to national recommendations for restrictions regarding the COVID-19 Pandemic (e.g. social distancing).  This format is felt to be most appropriate for this patient at this time.  All issues noted in this document were discussed and addressed.  No physical exam was performed (except for noted visual exam findings with Video Visits).  Please refer to the patient's chart (MyChart message for video visits and phone note for telephone visits) for the patient's consent to telehealth for [facility].  Date:  02/06/2019   ID:  Madison Holden, DOB 10-May-1946, MRN [id number]  Patient Location: [place]  Provider location:   Home  PCP:  Orpah Melter, MD  Cardiologist:  Harrington Challenger 2013   Chief Complaint:  F/U of HTN and SOB  History of Present Illness:    Madison Holden is a 73 y.o. female who presents via audio/video conferencing for a telehealth visit today.  Pt is a 73 yo with hx of HTN and DM   I saw her in 2013   She was seen in September for ARF and hyperkalemia   Had N/V and diarrhea at time    Meds adjusted    The pt went on to have a hysterectomy   Had testing noted below for preop (myovue, echo) The patient has had f/u in renal clinic in March   Pt does get SOB with activity   Denies CP   Stable     PT having signif problems with gout The patient does not have symptoms concerning for COVID-19 infection (fever, chills, cough, or new shortness of breath).    Prior CV studies:   The following studies were reviewed today: Echo:   September 2019 LVEF normal    No signif valve abnormalities  Myovue:  Normal perfusion   September    Past Medical History:  Diagnosis Date  . Ambulates with cane    due to gout   . Arthritis    hands  . Chest pain, unspecified 07-01-2018  per pt had 2 day chest pain and sob  6 weeks ago,, per pt no symptoms since   cardiology-- dr Salley Scarlet--  scheduled for  stress test and echo 07-03-2018 prior to surgery scheduled 07-08-2018  . Diarrhea, unspecified    intermittant  . DOE (dyspnea on exertion)   . Endometrial cancer ([place])   . Essential hypertension   . Frequency of urination   . GERD (gastroesophageal reflux disease)   . Gout rheumologist-- michelle young PA ([facility])   07-01-2018 per pt flare up april 2019 , hands and wrists  . Heart murmur   . History of adenomatous polyp of colon   . History of esophageal dilatation    for stricture  . Mild nausea    per pt intermittant , no vomiting  . Mixed dyslipidemia   . Muscle weakness of extremity    bilateral upper and lower extremities due to gout  . Poor appetite    per pt   . Type 2 diabetes mellitus ([place])    followed by pcp  . Urgency of urination   . Urine frequency    Past Surgical History:  Procedure Laterality Date  . BREAST CYST EXCISION Right 1974   benign per pt  . CATARACT EXTRACTION W/ INTRAOCULAR LENS  IMPLANT, BILATERAL  2014;  2017  . MOUTH SURGERY  11/2017   Upper quadrants,  per pt peridontist did laser procedure on  gums  . ROBOTIC ASSISTED TOTAL HYSTERECTOMY WITH BILATERAL SALPINGO OOPHERECTOMY Bilateral 07/22/2018   Procedure: XI ROBOTIC ASSISTED TOTAL HYSTERECTOMY WITH BILATERAL SALPINGO OOPHORECTOMY AND SENTINAL LYMPH NODE BIOPSY;  Surgeon: Everitt Amber, MD;  Location: [facility];  Service: Gynecology;  Laterality: Bilateral;  . TONSILLECTOMY  child     Current Meds  Medication Sig  . allopurinol (ZYLOPRIM) 300 MG tablet Take 300 mg by mouth daily.  . diphenhydrAMINE (BENADRYL) 25 MG tablet Take 25 mg by mouth at bedtime as needed for sleep.   Marland Kitchen esomeprazole (NEXIUM) 20 MG capsule Take 20 mg by mouth 3 (three) times a week.  . ferrous gluconate (FERGON) 324 MG tablet TAKE 1 TABLET (324 MG TOTAL) BY MOUTH 2 (TWO) TIMES DAILY WITH A MEAL.  . folic acid (FOLVITE) 734 MCG tablet Take 400 mcg by mouth daily.  Marland Kitchen glipiZIDE (GLUCOTROL XL) 5 MG 24 hr tablet  Take 5 mg by mouth daily.  . hydrochlorothiazide (HYDRODIURIL) 25 MG tablet Take 12.5 mg by mouth daily.  Marland Kitchen HYDROcodone-acetaminophen (NORCO/VICODIN) 5-325 MG tablet Take 1 tablet by mouth every 6 (six) hours as needed for moderate pain.  . Magnesium 500 MG TABS Take 500 mg by mouth every other day.  . Melatonin 3 MG TABS Take 3-6 mg by mouth at bedtime as needed (sleep).   . methotrexate 250 MG/10ML injection once a week. 0.9  . oxybutynin (DITROPAN) 5 MG tablet Take 5 mg by mouth daily. May take a second 5 mg dose as needed for bladder spasms  . pioglitazone (ACTOS) 15 MG tablet Take 7.5 mg by mouth daily.  . vitamin C (ASCORBIC ACID) 500 MG tablet Take 500 mg by mouth 4 (four) times a week.     Allergies:   Patient has no known allergies.   Social History   Tobacco Use  . Smoking status: Never Smoker  . Smokeless tobacco: Never Used  Substance Use Topics  . Alcohol use: Not Currently    Comment: 2-3 per  week, not currently drinking since Apri; 2019  . Drug use: No     Family Hx: The patient's family history includes Clotting disorder in her father; Diabetes in her maternal grandfather; Heart disease in her father and mother; Heart failure in her mother; Prostate cancer in her father. There is no history of Colon cancer.  ROS:   Please see the history of present illness.    All other systems reviewed and are negative.   Labs/Other Tests and Data Reviewed:    Recent Labs: 07/01/2018: Magnesium 1.7 07/18/2018: ALT 17 07/23/2018: BUN 18; Creatinine, Ser 0.95; Hemoglobin 7.8; Platelets 643; Potassium 4.5; Sodium 138   Recent Lipid Panel Lab Results  Component Value Date/Time   CHOL 119 06/29/2010 09:48 AM   TRIG 205.0 (H) 06/29/2010 09:48 AM   HDL 29.90 (L) 06/29/2010 09:48 AM   CHOLHDL 4 06/29/2010 09:48 AM   LDLDIRECT 56.6 06/29/2010 09:48 AM    Wt Readings from Last 3 Encounters:  02/06/19 188 lb (85.3 kg)  08/01/18 196 lb (88.9 kg)  07/22/18 199 lb (90.3 kg)      Objective:    Vital Signs:  BP (!) 163/97   Pulse 89   Ht 4\' 10"  (1.473 m)   Wt 188 lb (85.3 kg) Comment: per pt  BMI 39.29 kg/m    Patient not examined due to tele visit with current COVID pandemic   ASSESSMENT & PLAN:    1. HTN  BP is high   Will add  low dose amlodipine   2.5 mg daily   F/U with televisit in 4 wks     2   HL   Pt had been on statin   Achy on atorvastatin   Pt has plaquing on CT scan   Should be on statin   ? Crestor    Trial    3. Atherosclerosis   Evid on CT   Will persue lipid lowering once COVID restraints ease  4   DM   A1C was good    4.   COVID-19 Education: The signs and symptoms of COVID-19 were discussed with the patient and how to seek care for testing  The importance of social distancing was discussed today.  Patient Risk:   After full review of this patient's clinical status, I feel that they are at least moderate risk at this time.  Time:   Today, I have spent 30  minutes directly with the patient on  discussing medical problems including.  We also reviewed the symptoms of COVID 19 and the ways to protect against contracting the virus with telehealth technology.  I spent an additional 10 minutes reviewing patient's chart including tests.  Medication Adjustments/Labs and Tests Ordered: Current medicines are reviewed at length with the patient today.  Concerns regarding medicines are outlined above.  Tests Ordered: No orders of the defined types were placed in this encounter.  Medication Changes: No orders of the defined types were placed in this encounter.   Disposition:  Follow up 4 wks     Signed, Dorris Carnes, MD  02/06/2019 9:31 AM    [place]

## 2019-02-06 NOTE — Patient Instructions (Signed)
Medication Instructions:  Your physician has recommended you make the following change in your medication:  1.) start amlodipine 2.5 mg once a day for blood pressure  If you need a refill on your cardiac medications before your next appointment, please call your pharmacy.   Lab work: None  Testing/Procedures: none  Follow-Up: We have scheduled follow up VIRTUAL (phone) visit with Dr. Harrington Challenger for Thursday, Mar 12, 2019.    Any Other Special Instructions Will Be Listed Below (If Applicable). Check you blood pressures at home and fax a copy of the readings to the office at [phone].  We will have them scanned into your chart.  Also today, you were verbally given an activation code to set up [place].

## 2019-02-06 NOTE — Telephone Encounter (Signed)
New Message   Pt c/o BP issue: STAT if pt c/o blurred vision, one-sided weakness or slurred speech  1. What are your last 5 BP readings?  March 11th- 130/74  March 27th- 150/70  April 14th- 143/88 hr 89 April 15th- 134/68  Hr 72 April 16th- 147/77  Hr 86   2. Are you having any other symptoms (ex. Dizziness, headache, blurred vision, passed out)? No   3. What is your BP issue? Pts husband is calling to leave some of the blood pressure readings before the virtual visit appt

## 2019-02-06 NOTE — Telephone Encounter (Signed)
Will route to Dr. Harrington Challenger.

## 2019-02-10 ENCOUNTER — Telehealth: Payer: Self-pay | Admitting: *Deleted

## 2019-02-10 NOTE — Telephone Encounter (Signed)
-----   Message from Fay Records, MD sent at 02/09/2019  1:26 PM EDT ----- LIpids are excellent NO new recommendations

## 2019-02-10 NOTE — Telephone Encounter (Signed)
DPR ok to lmom. Left message lipids are excellent per Dr. Harrington Challenger. Per Dr. Harrington Challenger no new recommendations. Please call the office if any questions, [phone].

## 2019-02-13 DIAGNOSIS — R195 Other fecal abnormalities: Secondary | ICD-10-CM | POA: Diagnosis not present

## 2019-02-18 ENCOUNTER — Telehealth: Payer: Self-pay | Admitting: *Deleted

## 2019-02-18 DIAGNOSIS — M0579 Rheumatoid arthritis with rheumatoid factor of multiple sites without organ or systems involvement: Secondary | ICD-10-CM | POA: Diagnosis not present

## 2019-02-18 NOTE — Telephone Encounter (Signed)
Called and left the patient a message to call the office back. Need to change her appt on 5/6 to a virutal visit or phone visit

## 2019-02-19 NOTE — Telephone Encounter (Signed)
Patient called back and changed her appt to a phone visit. Patient is going to fax a new medicine list over

## 2019-02-25 ENCOUNTER — Encounter: Payer: Self-pay | Admitting: Gynecologic Oncology

## 2019-02-25 ENCOUNTER — Inpatient Hospital Stay: Payer: Medicare Other | Attending: Gynecologic Oncology | Admitting: Gynecologic Oncology

## 2019-02-25 DIAGNOSIS — Z90722 Acquired absence of ovaries, bilateral: Secondary | ICD-10-CM | POA: Diagnosis not present

## 2019-02-25 DIAGNOSIS — Z90710 Acquired absence of both cervix and uterus: Secondary | ICD-10-CM

## 2019-02-25 DIAGNOSIS — I011 Acute rheumatic endocarditis: Secondary | ICD-10-CM | POA: Diagnosis not present

## 2019-02-25 DIAGNOSIS — C541 Malignant neoplasm of endometrium: Secondary | ICD-10-CM

## 2019-02-25 NOTE — Patient Instructions (Signed)
Patient instructed to follow-up in 12 months with me and Dr Philis Pique in 6 months.

## 2019-02-25 NOTE — Progress Notes (Signed)
Gynecologic Oncology Telehealth Follow-up Note: Gyn-Onc  I connected with [name] on 02/25/19 at  2:30 PM EDT by telephone and verified that I am speaking with the correct person using two identifiers.  I discussed the limitations, risks, security and privacy concerns of performing an evaluation and management service by telemedicine and the availability of in-person appointments. I also discussed with the patient that there may be a patient responsible charge related to this service. The patient expressed understanding and agreed to proceed.  Other persons participating in the visit and their role in the encounter: none.  Patient's location: home Provider's location: [place]  Chief Complaint:  Chief Complaint  Patient presents with  . 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  Plan: Pathology revealed low risk factors for recurrence, therefore no adjuvant therapy is recommended according to NCCN guidelines.  I discussed risk for recurrence and typical symptoms encouraged her to notify us of these should they develop between visits.  I recommend she have follow-up every 6 months for 5 years in accordance with NCCN guidelines. Those visits should include symptom assessment, physical exam and pelvic examination. Pap smears are not indicated or recommended in the routine surveillance of endometrial cancer.  She will follow up with me in 12 months and in 6 months with Dr Philis Pique.   HPI: 73 year old white married female seen in consultation at the request of Dr. Philis Pique regarding management of a newly diagnosed endometrial cancer.  Patient had a routine Pap smear that showed atypical glandular cells.  Cervix was stenotic requiring the patient undergo a D&C which revealed a polypoid lesion in the endometrial cavity.  Final pathology showed this to be a well-differentiated  endometrial carcinoma.  Uterus is normal size (sounded 7 cm).  Patient denies any postmenopausal bleeding or discharge.  She has no pelvic pain pressure and has no GI or GU symptoms.  She denies any past gynecologic history.  She has a number of medical problems listed above.  Most recently she has had a bout of gout causing fair amount of pain in her arms hands and legs.  At her preop visit she was noted to have hyperkalemia (7.2) and AKI. She was admitted and determined to have acute on chronic kidney injury from NSAID use in the setting of a gout flare. Supportive therapy was instituted and she recovered normalizing her labs (normal potassium at recheck on 05/15/18).   On 10.1.19 she underwent robotic assisted total hysterectomy, BSO, and bilateral SLN biopsy. Findings showed a grade 1 cancer with no myo invasion, no LVSI, negative cervical or nodal involvmement. She was staged as stage IA grade 1 endometrioid adenocarcinoma. MSI testing was stable/MMR normal. She was determined to have low risk disease and therefore no adjuvant therapy was prescribed in accordance with NCCN guidelines.   Interval Hx: Has been diagnosed with rheumatoid arthritis. On methotrexate for this and enbril.  Yesterday BP 120/78, pulse 86.    Review of Systems:10 point review of systems is negative except as noted in interval history.   Vitals: There were no vitals taken for this visit.  Physical Exam: Deferred due to telephone visit.     No Known Allergies  Past Medical History:  Diagnosis Date  . Ambulates with cane    due to gout   . Arthritis    hands  . Chest pain, unspecified 07-01-2018  per pt had  2 day chest pain and sob  6 weeks ago,, per pt no symptoms since   cardiology-- dr Salley Scarlet--  scheduled for stress test and echo 07-03-2018 prior to surgery scheduled 07-08-2018  . Diarrhea, unspecified    intermittant  . DOE (dyspnea on exertion)   . Endometrial cancer ([place])   . Essential  hypertension   . Frequency of urination   . GERD (gastroesophageal reflux disease)   . Gout rheumologist-- michelle young PA ([facility])   07-01-2018 per pt flare up april 2019 , hands and wrists  . Heart murmur   . History of adenomatous polyp of colon   . History of esophageal dilatation    for stricture  . Mild nausea    per pt intermittant , no vomiting  . Mixed dyslipidemia   . Muscle weakness of extremity    bilateral upper and lower extremities due to gout  . Poor appetite    per pt   . Type 2 diabetes mellitus ([place])    followed by pcp  . Urgency of urination   . Urine frequency     Past Surgical History:  Procedure Laterality Date  . BREAST CYST EXCISION Right 1974   benign per pt  . CATARACT EXTRACTION W/ INTRAOCULAR LENS  IMPLANT, BILATERAL  2014;  2017  . MOUTH SURGERY  11/2017   Upper quadrants,  per pt peridontist did laser procedure on gums  . ROBOTIC ASSISTED TOTAL HYSTERECTOMY WITH BILATERAL SALPINGO OOPHERECTOMY Bilateral 07/22/2018   Procedure: XI ROBOTIC ASSISTED TOTAL HYSTERECTOMY WITH BILATERAL SALPINGO OOPHORECTOMY AND SENTINAL LYMPH NODE BIOPSY;  Surgeon: Everitt Amber, MD;  Location: [facility];  Service: Gynecology;  Laterality: Bilateral;  . TONSILLECTOMY  child    Current Outpatient Medications  Medication Sig Dispense Refill  . allopurinol (ZYLOPRIM) 300 MG tablet Take 300 mg by mouth daily.    Marland Kitchen amLODipine (NORVASC) 2.5 MG tablet Take 1 tablet (2.5 mg total) by mouth daily. 90 tablet 3  . diphenhydrAMINE (BENADRYL) 25 MG tablet Take 25 mg by mouth at bedtime as needed for sleep.     Marland Kitchen esomeprazole (NEXIUM) 20 MG capsule Take 20 mg by mouth 3 (three) times a week.    . ferrous gluconate (FERGON) 324 MG tablet TAKE 1 TABLET (324 MG TOTAL) BY MOUTH 2 (TWO) TIMES DAILY WITH A MEAL. 60 tablet 1  . folic acid (FOLVITE) 841 MCG tablet Take 400 mcg by mouth daily.    Marland Kitchen glipiZIDE (GLUCOTROL XL) 5 MG 24 hr tablet Take 5 mg by mouth daily.    .  hydrochlorothiazide (HYDRODIURIL) 25 MG tablet Take 12.5 mg by mouth daily.    Marland Kitchen HYDROcodone-acetaminophen (NORCO/VICODIN) 5-325 MG tablet Take 1 tablet by mouth every 6 (six) hours as needed for moderate pain.    . Magnesium 500 MG TABS Take 500 mg by mouth every other day.    . Melatonin 3 MG TABS Take 3-6 mg by mouth at bedtime as needed (sleep).     . methotrexate 250 MG/10ML injection once a week. 0.9    . oxybutynin (DITROPAN) 5 MG tablet Take 5 mg by mouth daily. May take a second 5 mg dose as needed for bladder spasms    . pioglitazone (ACTOS) 15 MG tablet Take 7.5 mg by mouth daily.    . vitamin C (ASCORBIC ACID) 500 MG tablet Take 500 mg by mouth 4 (four) times a week.     No current facility-administered medications for this visit.  Social History   Socioeconomic History  . Marital status: Married    Spouse name: Not on file  . Number of children: 0  . Years of education: Not on file  . Highest education level: Not on file  Occupational History  . Occupation: retired  Scientific laboratory technician  . Financial resource strain: Not on file  . Food insecurity:    Worry: Not on file    Inability: Not on file  . Transportation needs:    Medical: Not on file    Non-medical: Not on file  Tobacco Use  . Smoking status: Never Smoker  . Smokeless tobacco: Never Used  Substance and Sexual Activity  . Alcohol use: Not Currently    Comment: 2-3 per  week, not currently drinking since Apri; 2019  . Drug use: No  . Sexual activity: Not on file  Lifestyle  . Physical activity:    Days per week: Not on file    Minutes per session: Not on file  . Stress: Not on file  Relationships  . Social connections:    Talks on phone: Not on file    Gets together: Not on file    Attends religious service: Not on file    Active member of club or organization: Not on file    Attends meetings of clubs or organizations: Not on file    Relationship status: Not on file  . Intimate partner violence:     Fear of current or ex partner: Not on file    Emotionally abused: Not on file    Physically abused: Not on file    Forced sexual activity: Not on file  Other Topics Concern  . Not on file  Social History Narrative   Has never smoked. Daily Caffeine Use- 2 cups per day. Does not get regular exercise. Retired.     Family History  Problem Relation Age of Onset  . Heart disease Mother   . Heart failure Mother   . Heart disease Father   . Prostate cancer Father   . Clotting disorder Father   . Diabetes Maternal Grandfather   . Colon cancer Neg Hx     I discussed the assessment and treatment plan with the patient. The patient was provided with an opportunity to ask questions and all were answered. The patient agreed with the plan and demonstrated an understanding of the instructions.   The patient was advised to call back or see an in-person evaluation if the symptoms worsen or if the condition fails to improve as anticipated.   I provided 15 minutes of non face-to-face telephone visit time during this encounter, and > 50% was spent counseling as documented under my assessment & plan.   Thereasa Solo, MD 02/25/2019, 3:16 PM

## 2019-03-12 ENCOUNTER — Encounter: Payer: Self-pay | Admitting: Internal Medicine

## 2019-03-12 ENCOUNTER — Other Ambulatory Visit: Payer: Self-pay

## 2019-03-12 ENCOUNTER — Telehealth (INDEPENDENT_AMBULATORY_CARE_PROVIDER_SITE_OTHER): Payer: Medicare Other | Admitting: Internal Medicine

## 2019-03-12 VITALS — BP 133/77 | HR 70 | Ht <= 58 in

## 2019-03-12 DIAGNOSIS — I2510 Atherosclerotic heart disease of native coronary artery without angina pectoris: Secondary | ICD-10-CM | POA: Diagnosis not present

## 2019-03-12 DIAGNOSIS — I10 Essential (primary) hypertension: Secondary | ICD-10-CM | POA: Diagnosis not present

## 2019-03-12 DIAGNOSIS — E782 Mixed hyperlipidemia: Secondary | ICD-10-CM

## 2019-03-12 NOTE — Patient Instructions (Signed)
Medication Instructions:  No changes If you need a refill on your cardiac medications before your next appointment, please call your pharmacy.   Lab work: none If you have labs (blood work) drawn today and your tests are completely normal, you will receive your results only by: . MyChart Message (if you have MyChart) OR . A paper copy in the mail If you have any lab test that is abnormal or we need to change your treatment, we will call you to review the results.  Testing/Procedures: none  Follow-Up: At [facility], you and your health needs are our priority.  As part of our continuing mission to provide you with exceptional heart care, we have created designated Provider Care Teams.  These Care Teams include your primary Cardiologist (physician) and Advanced Practice Providers (APPs -  Physician Assistants and Nurse Practitioners) who all work together to provide you with the care you need, when you need it. You will need a follow up appointment in:  5 months.  Please call our office 2 months in advance to schedule this appointment.  You may see Paula Ross, MD or one of the following Advanced Practice Providers on your designated Care Team: Scott Weaver, PA-C Vin Bhagat, PA-C . Janine Hammond, NP  Any Other Special Instructions Will Be Listed Below (If Applicable).    

## 2019-03-12 NOTE — Progress Notes (Signed)
Virtual Visit via Telephone Note   This visit type was conducted due to national recommendations for restrictions regarding the COVID-19 Pandemic (e.g. social distancing) in an effort to limit this patient's exposure and mitigate transmission in our community.  Due to her co-morbid illnesses, this patient is at least at moderate risk for complications without adequate follow up.  This format is felt to be most appropriate for this patient at this time.  The patient did not have access to video technology/had technical difficulties with video requiring transitioning to audio format only (telephone).  All issues noted in this document were discussed and addressed.  No physical exam could be performed with this format.  Please refer to the patient's chart for her  consent to telehealth for [facility].   Date:  03/12/2019   ID:  Madison Holden, DOB 1945/12/02, MRN [id number]  Patient Location: Home Provider Location: Home  PCP:  Orpah Melter, MD  Cardiologist:  No primary care provider on file.  Electrophysiologist:  None   Evaluation Performed:  Follow-Up Visit  Chief Complaint:    History of Present Illness:    Madison Holden is a 73 y.o. female with hx of HTN and DM   I spoke with the patient in a televisit in April 2020   She has some SOB with activity as well as gout  I recomm adding amlodipine 2.5 mg for BP     Also recomm a trial of Crestor since evid of plaquing on CT   REcomm starting after COVID restrictions eased    Patient says her BP is much better 120s mainly     Breathing OK   No significant edema ankles    The patient does not have symptoms concerning for COVID-19 infection (fever, chills, cough, or new shortness of breath).    Past Medical History:  Diagnosis Date  . Ambulates with cane    due to gout   . Arthritis    hands  . Chest pain, unspecified 07-01-2018  per pt had 2 day chest pain and sob  6 weeks ago,, per pt no symptoms since   cardiology-- dr Salley Scarlet--  scheduled for stress test and echo 07-03-2018 prior to surgery scheduled 07-08-2018  . Diarrhea, unspecified    intermittant  . DOE (dyspnea on exertion)   . Endometrial cancer ([place])   . Essential hypertension   . Frequency of urination   . GERD (gastroesophageal reflux disease)   . Gout rheumologist-- michelle young PA ([facility])   07-01-2018 per pt flare up april 2019 , hands and wrists  . Heart murmur   . History of adenomatous polyp of colon   . History of esophageal dilatation    for stricture  . Mild nausea    per pt intermittant , no vomiting  . Mixed dyslipidemia   . Muscle weakness of extremity    bilateral upper and lower extremities due to gout  . Poor appetite    per pt   . Type 2 diabetes mellitus ([place])    followed by pcp  . Urgency of urination   . Urine frequency    Past Surgical History:  Procedure Laterality Date  . BREAST CYST EXCISION Right 1974   benign per pt  . CATARACT EXTRACTION W/ INTRAOCULAR LENS  IMPLANT, BILATERAL  2014;  2017  . MOUTH SURGERY  11/2017   Upper quadrants,  per pt peridontist did laser procedure on gums  . ROBOTIC ASSISTED TOTAL HYSTERECTOMY WITH BILATERAL  SALPINGO OOPHERECTOMY Bilateral 07/22/2018   Procedure: XI ROBOTIC ASSISTED TOTAL HYSTERECTOMY WITH BILATERAL SALPINGO OOPHORECTOMY AND SENTINAL LYMPH NODE BIOPSY;  Surgeon: Everitt Amber, MD;  Location: [facility];  Service: Gynecology;  Laterality: Bilateral;  . TONSILLECTOMY  child     Current Meds  Medication Sig  . allopurinol (ZYLOPRIM) 300 MG tablet Take 300 mg by mouth daily.  Marland Kitchen amLODipine (NORVASC) 2.5 MG tablet Take 1 tablet (2.5 mg total) by mouth daily.  . B-D TB SYRINGE 1CC/27GX1/2" 27G X 1/2" 1 ML MISC USE WEEKLY WITH METHOTREXATE SUBCUTANEOUSLY 90 DAYS  . diphenhydrAMINE (BENADRYL) 25 MG tablet Take 25 mg by mouth at bedtime as needed for sleep.   Scarlette Shorts SURECLICK 50 MG/ML injection 25 mg as directed. Two times a week  .  esomeprazole (NEXIUM) 20 MG capsule Take 20 mg by mouth 3 (three) times a week.  . ferrous gluconate (FERGON) 324 MG tablet TAKE 1 TABLET (324 MG TOTAL) BY MOUTH 2 (TWO) TIMES DAILY WITH A MEAL.  Marland Kitchen Ferrous Gluconate 324 (37.5 Fe) MG TABS TAKE 1 TABLET WITH WATER OR JUICE BETWEEN MEALS TWICE A DAY ORALLY 30 DAY(S)  . folic acid (FOLVITE) 1 MG tablet Take 1 mg by mouth daily.  . folic acid (FOLVITE) 017 MCG tablet Take 400 mcg by mouth daily.  Marland Kitchen glipiZIDE (GLUCOTROL XL) 5 MG 24 hr tablet Take 5 mg by mouth daily.  . hydrochlorothiazide (HYDRODIURIL) 25 MG tablet Take 12.5 mg by mouth daily.  Marland Kitchen HYDROcodone-acetaminophen (NORCO/VICODIN) 5-325 MG tablet Take 1 tablet by mouth every 6 (six) hours as needed for moderate pain.  . Magnesium 500 MG TABS Take 500 mg by mouth every other day.  . Melatonin 3 MG TABS Take 3-6 mg by mouth at bedtime as needed (sleep).   . methotrexate 250 MG/10ML injection once a week. 0.9  . oxybutynin (DITROPAN) 5 MG tablet Take 5 mg by mouth daily. May take a second 5 mg dose as needed for bladder spasms  . pioglitazone (ACTOS) 15 MG tablet Take 7.5 mg by mouth daily.  . pioglitazone (ACTOS) 30 MG tablet TAKE 1 2 (ONE HALF) TABLET BY MOUTH ONCE DAILY FOR 90 DAYS  . vitamin C (ASCORBIC ACID) 500 MG tablet Take 500 mg by mouth 4 (four) times a week.     Allergies:   Patient has no known allergies.   Social History   Tobacco Use  . Smoking status: Never Smoker  . Smokeless tobacco: Never Used  Substance Use Topics  . Alcohol use: Not Currently    Comment: 2-3 per  week, not currently drinking since Apri; 2019  . Drug use: No     Family Hx: The patient's family history includes Clotting disorder in her father; Diabetes in her maternal grandfather; Heart disease in her father and mother; Heart failure in her mother; Prostate cancer in her father. There is no history of Colon cancer.  ROS:   Please see the history of present illness.     All other systems reviewed  and are negative.   Prior CV studies:   The following studies were reviewed today:    Labs/Other Tests and Data Reviewed:    EKG:  No ekg to review  Recent Labs: 07/01/2018: Magnesium 1.7 07/18/2018: ALT 17 07/23/2018: BUN 18; Creatinine, Ser 0.95; Hemoglobin 7.8; Platelets 643; Potassium 4.5; Sodium 138   Recent Lipid Panel Lab Results  Component Value Date/Time   CHOL 119 06/29/2010 09:48 AM   TRIG 205.0 (H) 06/29/2010 09:48  AM   HDL 29.90 (L) 06/29/2010 09:48 AM   CHOLHDL 4 06/29/2010 09:48 AM   LDLDIRECT 56.6 06/29/2010 09:48 AM    Wt Readings from Last 3 Encounters:  02/06/19 188 lb (85.3 kg)  08/01/18 196 lb (88.9 kg)  07/22/18 199 lb (90.3 kg)     Objective:    Vital Signs:  BP 133/77   Pulse 70   Ht 4\' 10"  (1.473 m)   BMI 39.29 kg/m    VITAL SIGNS:  reviewed  ASSESSMENT & PLAN:    1. HTN   BP improved   DOing good on current regimen   Follow  2   Lipids   Will reassess in the fall  Once restrictions ease  With calcifications noted on coronary arteries on CT in 10/2018 would treat   Did not tolerate lipitor  Consider Crestor  3  CAD  Coronary calcifications on chest CT   Will follow up in fall.  NO symptoms of angina    COVID-19 Education: The signs and symptoms of COVID-19 were discussed with the patient and how to seek care for testing (follow up with PCP or arrange E-visit).  The importance of social distancing was discussed today.  Time:   Today, I have spent 15 minutes with the patient with telehealth technology discussing the above problems.     Medication Adjustments/Labs and Tests Ordered: Current medicines are reviewed at length with the patient today.  Concerns regarding medicines are outlined above.   Tests Ordered: No orders of the defined types were placed in this encounter.   Medication Changes: No orders of the defined types were placed in this encounter.   Disposition:  Follow up October 2020  SignedDorris Carnes, MD   03/12/2019 10:07 AM    [place]

## 2019-03-19 DIAGNOSIS — M0579 Rheumatoid arthritis with rheumatoid factor of multiple sites without organ or systems involvement: Secondary | ICD-10-CM | POA: Diagnosis not present

## 2019-03-19 DIAGNOSIS — M2550 Pain in unspecified joint: Secondary | ICD-10-CM | POA: Diagnosis not present

## 2019-03-19 DIAGNOSIS — Z719 Counseling, unspecified: Secondary | ICD-10-CM | POA: Diagnosis not present

## 2019-03-19 DIAGNOSIS — M1A09X0 Idiopathic chronic gout, multiple sites, without tophus (tophi): Secondary | ICD-10-CM | POA: Diagnosis not present

## 2019-04-03 ENCOUNTER — Encounter (INDEPENDENT_AMBULATORY_CARE_PROVIDER_SITE_OTHER): Payer: Medicare Other | Admitting: Ophthalmology

## 2019-04-08 ENCOUNTER — Telehealth: Payer: Self-pay | Admitting: Internal Medicine

## 2019-04-08 NOTE — Telephone Encounter (Signed)
Patient called and states that some her medications are duplicated. She states she mailed the office a list of her current medications as of April 2020. She had a few different procedures done, and had her med list revamped. She just wants to make sure that the office has an updated record of her medications.   She has an appt scheduled with Dr. Harrington Challenger for 08/31 and can bring the updated list then too.

## 2019-04-10 ENCOUNTER — Encounter (INDEPENDENT_AMBULATORY_CARE_PROVIDER_SITE_OTHER): Payer: Medicare Other | Admitting: Ophthalmology

## 2019-04-10 ENCOUNTER — Other Ambulatory Visit: Payer: Self-pay

## 2019-04-10 DIAGNOSIS — H35033 Hypertensive retinopathy, bilateral: Secondary | ICD-10-CM | POA: Diagnosis not present

## 2019-04-10 DIAGNOSIS — E11319 Type 2 diabetes mellitus with unspecified diabetic retinopathy without macular edema: Secondary | ICD-10-CM

## 2019-04-10 DIAGNOSIS — I10 Essential (primary) hypertension: Secondary | ICD-10-CM

## 2019-04-10 DIAGNOSIS — E113293 Type 2 diabetes mellitus with mild nonproliferative diabetic retinopathy without macular edema, bilateral: Secondary | ICD-10-CM

## 2019-04-10 DIAGNOSIS — H43813 Vitreous degeneration, bilateral: Secondary | ICD-10-CM

## 2019-04-10 DIAGNOSIS — D3132 Benign neoplasm of left choroid: Secondary | ICD-10-CM

## 2019-04-13 DIAGNOSIS — M542 Cervicalgia: Secondary | ICD-10-CM | POA: Diagnosis not present

## 2019-04-20 NOTE — Telephone Encounter (Signed)
Spoke with patient and deleted meds that were duplicated. (folic acid and iron).  Added tylenol arthritis. Pt aware of next visit on 8/31.

## 2019-04-20 NOTE — Telephone Encounter (Signed)
Left message for patient to call back if she needs to discuss medicine changes.  Otherwise we can review prior to her next appointment with Dr. Harrington Challenger.

## 2019-04-20 NOTE — Telephone Encounter (Signed)
New message    Patient returning your call will still like to talk to you to clear up medication issue.

## 2019-04-21 DIAGNOSIS — M542 Cervicalgia: Secondary | ICD-10-CM | POA: Diagnosis not present

## 2019-04-23 DIAGNOSIS — M47812 Spondylosis without myelopathy or radiculopathy, cervical region: Secondary | ICD-10-CM | POA: Diagnosis not present

## 2019-04-23 DIAGNOSIS — M542 Cervicalgia: Secondary | ICD-10-CM | POA: Diagnosis not present

## 2019-05-06 DIAGNOSIS — E113293 Type 2 diabetes mellitus with mild nonproliferative diabetic retinopathy without macular edema, bilateral: Secondary | ICD-10-CM | POA: Diagnosis not present

## 2019-05-06 DIAGNOSIS — H43813 Vitreous degeneration, bilateral: Secondary | ICD-10-CM | POA: Diagnosis not present

## 2019-05-06 DIAGNOSIS — H35013 Changes in retinal vascular appearance, bilateral: Secondary | ICD-10-CM | POA: Diagnosis not present

## 2019-05-06 DIAGNOSIS — H35033 Hypertensive retinopathy, bilateral: Secondary | ICD-10-CM | POA: Diagnosis not present

## 2019-05-08 DIAGNOSIS — E11319 Type 2 diabetes mellitus with unspecified diabetic retinopathy without macular edema: Secondary | ICD-10-CM | POA: Diagnosis not present

## 2019-05-08 DIAGNOSIS — I10 Essential (primary) hypertension: Secondary | ICD-10-CM | POA: Diagnosis not present

## 2019-05-08 DIAGNOSIS — N3281 Overactive bladder: Secondary | ICD-10-CM | POA: Diagnosis not present

## 2019-05-20 DIAGNOSIS — Z719 Counseling, unspecified: Secondary | ICD-10-CM | POA: Diagnosis not present

## 2019-05-20 DIAGNOSIS — M0579 Rheumatoid arthritis with rheumatoid factor of multiple sites without organ or systems involvement: Secondary | ICD-10-CM | POA: Diagnosis not present

## 2019-05-20 DIAGNOSIS — M1A09X0 Idiopathic chronic gout, multiple sites, without tophus (tophi): Secondary | ICD-10-CM | POA: Diagnosis not present

## 2019-05-20 DIAGNOSIS — K1329 Other disturbances of oral epithelium, including tongue: Secondary | ICD-10-CM | POA: Diagnosis not present

## 2019-05-20 DIAGNOSIS — M2550 Pain in unspecified joint: Secondary | ICD-10-CM | POA: Diagnosis not present

## 2019-05-21 DIAGNOSIS — M47812 Spondylosis without myelopathy or radiculopathy, cervical region: Secondary | ICD-10-CM | POA: Diagnosis not present

## 2019-05-21 DIAGNOSIS — M5136 Other intervertebral disc degeneration, lumbar region: Secondary | ICD-10-CM | POA: Diagnosis not present

## 2019-05-21 DIAGNOSIS — I10 Essential (primary) hypertension: Secondary | ICD-10-CM | POA: Diagnosis not present

## 2019-05-21 DIAGNOSIS — M4157 Other secondary scoliosis, lumbosacral region: Secondary | ICD-10-CM | POA: Diagnosis not present

## 2019-05-26 DIAGNOSIS — K1329 Other disturbances of oral epithelium, including tongue: Secondary | ICD-10-CM | POA: Diagnosis not present

## 2019-05-28 DIAGNOSIS — K1329 Other disturbances of oral epithelium, including tongue: Secondary | ICD-10-CM | POA: Diagnosis not present

## 2019-06-21 NOTE — Progress Notes (Signed)
Cardiology Office Note   Date:  06/22/2019   ID:  Madison Holden, DOB 08-09-1946, MRN [id number]  PCP:  Orpah Melter, MD  Cardiologist:   Dorris Carnes, MD    F/U of HTN     History of Present Illness: Madison Holden is a 73 y.o. female with a history of HTN and DM    I saw the pt as a televsiti in April and May    At that time added amlodipine to regimen   I asslo though she should add Crestor when seen in clinic   Mild plaquing on CT    Since we spoke she has done OK  BP at home 110s to 140s   Says it is up when she is busy  Denies CP  No dizziness  Breathing is OK      Current Meds  Medication Sig  . acetaminophen (TYLENOL) 650 MG CR tablet Take 650 mg by mouth every 8 (eight) hours as needed for pain.  Marland Kitchen allopurinol (ZYLOPRIM) 300 MG tablet Take 300 mg by mouth daily.  Marland Kitchen amLODipine (NORVASC) 2.5 MG tablet Take 1 tablet (2.5 mg total) by mouth daily.  . B-D TB SYRINGE 1CC/27GX1/2" 27G X 1/2" 1 ML MISC USE WEEKLY WITH METHOTREXATE SUBCUTANEOUSLY 90 DAYS  . diphenhydrAMINE (BENADRYL) 25 MG tablet Take 25 mg by mouth at bedtime as needed for sleep.   Scarlette Shorts SURECLICK 50 MG/ML injection 25 mg as directed. Two times a week  . esomeprazole (NEXIUM) 20 MG capsule Take 20 mg by mouth 3 (three) times a week.  . ferrous gluconate (FERGON) 324 MG tablet TAKE 1 TABLET (324 MG TOTAL) BY MOUTH 2 (TWO) TIMES DAILY WITH A MEAL.  . folic acid (FOLVITE) Q000111Q MCG tablet Take 400 mcg by mouth daily.  . hydrochlorothiazide (HYDRODIURIL) 25 MG tablet Take 12.5 mg by mouth daily.  . Magnesium 500 MG TABS Take 500 mg by mouth every other day.  . Melatonin 3 MG TABS Take 3-6 mg by mouth at bedtime as needed (sleep).   . methotrexate 250 MG/10ML injection once a week. 0.9  . oxybutynin (DITROPAN) 5 MG tablet Take 5 mg by mouth daily. May take a second 5 mg dose as needed for bladder spasms  . pioglitazone (ACTOS) 30 MG tablet Take 30 mg by mouth daily.   . vitamin C (ASCORBIC ACID) 500 MG  tablet Take 500 mg by mouth 4 (four) times a week.     Allergies:   Patient has no known allergies.   Past Medical History:  Diagnosis Date  . Ambulates with cane    due to gout   . Arthritis    hands  . Chest pain, unspecified 07-01-2018  per pt had 2 day chest pain and sob  6 weeks ago,, per pt no symptoms since   cardiology-- dr Salley Scarlet--  scheduled for stress test and echo 07-03-2018 prior to surgery scheduled 07-08-2018  . Diarrhea, unspecified    intermittant  . DOE (dyspnea on exertion)   . Endometrial cancer ([place])   . Essential hypertension   . Frequency of urination   . GERD (gastroesophageal reflux disease)   . Gout rheumologist-- michelle young PA ([facility])   07-01-2018 per pt flare up april 2019 , hands and wrists  . Heart murmur   . History of adenomatous polyp of colon   . History of esophageal dilatation    for stricture  . Mild nausea    per pt intermittant ,  no vomiting  . Mixed dyslipidemia   . Muscle weakness of extremity    bilateral upper and lower extremities due to gout  . Poor appetite    per pt   . Type 2 diabetes mellitus ([place])    followed by pcp  . Urgency of urination   . Urine frequency     Past Surgical History:  Procedure Laterality Date  . BREAST CYST EXCISION Right 1974   benign per pt  . CATARACT EXTRACTION W/ INTRAOCULAR LENS  IMPLANT, BILATERAL  2014;  2017  . MOUTH SURGERY  11/2017   Upper quadrants,  per pt peridontist did laser procedure on gums  . ROBOTIC ASSISTED TOTAL HYSTERECTOMY WITH BILATERAL SALPINGO OOPHERECTOMY Bilateral 07/22/2018   Procedure: XI ROBOTIC ASSISTED TOTAL HYSTERECTOMY WITH BILATERAL SALPINGO OOPHORECTOMY AND SENTINAL LYMPH NODE BIOPSY;  Surgeon: Everitt Amber, MD;  Location: [facility];  Service: Gynecology;  Laterality: Bilateral;  . TONSILLECTOMY  child     Social History:  The patient  reports that she has never smoked. She has never used smokeless tobacco. She reports previous alcohol  use. She reports that she does not use drugs.   Family History:  The patient's family history includes Clotting disorder in her father; Diabetes in her maternal grandfather; Heart disease in her father and mother; Heart failure in her mother; Prostate cancer in her father.    ROS:  Please see the history of present illness. All other systems are reviewed and  Negative to the above problem except as noted.    PHYSICAL EXAM: VS:  BP (!) 151/89   Pulse (!) 102   Ht 4\' 10"  (1.473 m)   Wt 192 lb 12.8 oz (87.5 kg)   BMI 40.30 kg/m   GEN: Morbdily obese 73 yo in no acute distress  HEENT: normal  Neck: no JVD, carotid bruits, or masses Cardiac: RRR; no murmurs, rubs, or gallops,no edema  Respiratory:  clear to auscultation bilaterally, normal work of breathing GI: soft, nontender, nondistended, + BS  MS: no deformity Moving all extremities   Skin: warm and dry, no rash Neuro:  Strength and sensation are intact Psych: euthymic mood, full affect   EKG:  EKG is not ordered today.   Lipid Panel    Component Value Date/Time   CHOL 119 06/29/2010 0948   TRIG 205.0 (H) 06/29/2010 0948   HDL 29.90 (L) 06/29/2010 0948   CHOLHDL 4 06/29/2010 0948   VLDL 41.0 (H) 06/29/2010 0948   LDLDIRECT 56.6 06/29/2010 0948      Wt Readings from Last 3 Encounters:  06/22/19 192 lb 12.8 oz (87.5 kg)  02/06/19 188 lb (85.3 kg)  08/01/18 196 lb (88.9 kg)      ASSESSMENT AND PLAN:  1  HTN  BP records are better at home  ? If cuff reliable  REcomm she keep log   In 4 to 6 wks bring cuff and log to clinic and recheck  No changes forn ow    2  HL  Need lipids   She has appt to get labs with PCP   I will write for these so they can be faxed here for review      3  CAD  Coronary calcificatiosn of CT      Current medicines are reviewed at length with the patient today.  The patient does not have concerns regarding medicines.  Signed, Dorris Carnes, MD  06/22/2019 11:30 AM    [place] [phone]  [address] [place], [place]  [address] Phone: [phone]; Fax: [phone]

## 2019-06-22 ENCOUNTER — Ambulatory Visit (INDEPENDENT_AMBULATORY_CARE_PROVIDER_SITE_OTHER): Payer: Medicare Other | Admitting: Internal Medicine

## 2019-06-22 ENCOUNTER — Other Ambulatory Visit: Payer: Self-pay

## 2019-06-22 ENCOUNTER — Encounter: Payer: Self-pay | Admitting: Internal Medicine

## 2019-06-22 VITALS — BP 151/89 | HR 102 | Ht <= 58 in | Wt 192.8 lb

## 2019-06-22 DIAGNOSIS — I10 Essential (primary) hypertension: Secondary | ICD-10-CM

## 2019-06-22 DIAGNOSIS — E782 Mixed hyperlipidemia: Secondary | ICD-10-CM

## 2019-06-22 NOTE — Patient Instructions (Addendum)
Medication Instructions:  No changes If you need a refill on your cardiac medications before your next appointment, please call your pharmacy.   Lab work: Take prescription with you to PCP appointment - so that results can be faxed to Dr. Harrington Challenger  If you have labs (blood work) drawn today and your tests are completely normal, you will receive your results only by: Marland Kitchen MyChart Message (if you have MyChart) OR . A paper copy in the mail If you have any lab test that is abnormal or we need to change your treatment, we will call you to review the results.  Testing/Procedures: none  Follow-Up: At [facility], you and your health needs are our priority.  As part of our continuing mission to provide you with exceptional heart care, we have created designated Provider Care Teams.  These Care Teams include your primary Cardiologist (physician) and Advanced Practice Providers (APPs -  Physician Assistants and Nurse Practitioners) who all work together to provide you with the care you need, when you need it. You will need a follow up appointment in:  6 months.  Please call our office 2 months in advance to schedule this appointment.  You may see No primary care provider on file. or one of the following Advanced Practice Providers on your designated Care Team: Richardson Dopp, PA-C [place], [place] . Daune Perch, NP  Any Other Special Instructions Will Be Listed Below (If Applicable). Bring blood pressure cuff and list of readings to your next visit with the pharmacist.

## 2019-06-23 ENCOUNTER — Encounter: Payer: Self-pay | Admitting: Physical Medicine & Rehabilitation

## 2019-06-23 ENCOUNTER — Encounter: Payer: Medicare Other | Attending: Physical Medicine & Rehabilitation | Admitting: Physical Medicine & Rehabilitation

## 2019-06-23 VITALS — BP 136/82 | HR 100 | Temp 97.3°F | Ht <= 58 in | Wt 193.8 lb

## 2019-06-23 DIAGNOSIS — Z79891 Long term (current) use of opiate analgesic: Secondary | ICD-10-CM | POA: Insufficient documentation

## 2019-06-23 DIAGNOSIS — M47812 Spondylosis without myelopathy or radiculopathy, cervical region: Secondary | ICD-10-CM | POA: Insufficient documentation

## 2019-06-23 DIAGNOSIS — G894 Chronic pain syndrome: Secondary | ICD-10-CM | POA: Insufficient documentation

## 2019-06-23 DIAGNOSIS — Z5181 Encounter for therapeutic drug level monitoring: Secondary | ICD-10-CM | POA: Diagnosis not present

## 2019-06-23 NOTE — Progress Notes (Signed)
Subjective:    Patient ID: Madison Holden, female    DOB: 1945-12-29, 73 y.o.   MRN: [id number]  HPI 73 year old female with history of rheumatoid arthritis with primary complaint of left greater than right-sided low back pain.  She has pain that radiates down into her trapezius as well as periscapular area.  She was seen initially at [facility] by Dr. Mardelle Matte who performed some trigger point injections which were helpful on a temporary basis.  An MRI was then performed and the patient was evaluated by Dr. Lorin Mercy.  There was evidence of left-sided C1-C2 arthropathy as well as facet arthrosis C2-3 C3-4 and C4-5.  She is not complaining of base of the skull pain. Patient is here today because she was hoping to have a physician in the [facility] system that can perform her neck injections.  She has been on pain medications in the past however currently and not taking this.  She lost quite a bit of weight with the diagnosis of endometrial carcinoma which was treated with hysterectomy.  She did not have metastatic disease  The patient has not had physical therapy for her neck issues but is willing to do this. Pain Inventory Average Pain 6 Pain Right Now 6 My pain is constant, stabbing and aching  In the last 24 hours, has pain interfered with the following? General activity 6 Relation with others 6 Enjoyment of life 7 What TIME of day is your pain at its worst? morning evenimg Sleep (in general) Fair  Pain is worse with: walking, bending and standing Pain improves with: rest, pacing activities and medication Relief from Meds: 2  Mobility walk with assistance use a walker how many minutes can you walk? 1 ability to climb steps?  no do you drive?  yes  Function retired I need assistance with the following:  bathing, meal prep, household duties and shopping Do you have any goals in this area?  yes  Neuro/Psych weakness trouble walking  Prior Studies Any changes since last  visit?  no  Physicians involved in your care Primary care Dr. Orpah Melter Rheumatologist [facility] Orthopedist Dr. Laroy Apple   Family History  Problem Relation Age of Onset  . Heart disease Mother   . Heart failure Mother   . Heart disease Father   . Prostate cancer Father   . Clotting disorder Father   . Diabetes Maternal Grandfather   . Colon cancer Neg Hx    Social History   Socioeconomic History  . Marital status: Married    Spouse name: Not on file  . Number of children: 0  . Years of education: Not on file  . Highest education level: Not on file  Occupational History  . Occupation: retired  Scientific laboratory technician  . Financial resource strain: Not on file  . Food insecurity    Worry: Not on file    Inability: Not on file  . Transportation needs    Medical: Not on file    Non-medical: Not on file  Tobacco Use  . Smoking status: Never Smoker  . Smokeless tobacco: Never Used  Substance and Sexual Activity  . Alcohol use: Not Currently    Comment: 2-3 per  week, not currently drinking since Apri; 2019  . Drug use: No  . Sexual activity: Not on file  Lifestyle  . Physical activity    Days per week: Not on file    Minutes per session: Not on file  . Stress: Not on  file  Relationships  . Social Herbalist on phone: Not on file    Gets together: Not on file    Attends religious service: Not on file    Active member of club or organization: Not on file    Attends meetings of clubs or organizations: Not on file    Relationship status: Not on file  Other Topics Concern  . Not on file  Social History Narrative   Has never smoked. Daily Caffeine Use- 2 cups per day. Does not get regular exercise. Retired.    Past Surgical History:  Procedure Laterality Date  . BREAST CYST EXCISION Right 1974   benign per pt  . CATARACT EXTRACTION W/ INTRAOCULAR LENS  IMPLANT, BILATERAL  2014;  2017  . MOUTH SURGERY  11/2017   Upper quadrants,  per pt  peridontist did laser procedure on gums  . ROBOTIC ASSISTED TOTAL HYSTERECTOMY WITH BILATERAL SALPINGO OOPHERECTOMY Bilateral 07/22/2018   Procedure: XI ROBOTIC ASSISTED TOTAL HYSTERECTOMY WITH BILATERAL SALPINGO OOPHORECTOMY AND SENTINAL LYMPH NODE BIOPSY;  Surgeon: Everitt Amber, MD;  Location: [facility];  Service: Gynecology;  Laterality: Bilateral;  . TONSILLECTOMY  child   Past Medical History:  Diagnosis Date  . Ambulates with cane    due to gout   . Arthritis    hands  . Chest pain, unspecified 07-01-2018  per pt had 2 day chest pain and sob  6 weeks ago,, per pt no symptoms since   cardiology-- dr Salley Scarlet--  scheduled for stress test and echo 07-03-2018 prior to surgery scheduled 07-08-2018  . Diarrhea, unspecified    intermittant  . DOE (dyspnea on exertion)   . Endometrial cancer ([place])   . Essential hypertension   . Frequency of urination   . GERD (gastroesophageal reflux disease)   . Gout rheumologist-- michelle young PA ([facility])   07-01-2018 per pt flare up april 2019 , hands and wrists  . Heart murmur   . History of adenomatous polyp of colon   . History of esophageal dilatation    for stricture  . Mild nausea    per pt intermittant , no vomiting  . Mixed dyslipidemia   . Muscle weakness of extremity    bilateral upper and lower extremities due to gout  . Poor appetite    per pt   . Type 2 diabetes mellitus ([place])    followed by pcp  . Urgency of urination   . Urine frequency    BP 136/82   Pulse 100   Temp (!) 97.3 F (36.3 C)   Ht 4\' 10"  (1.473 m)   Wt 193 lb 12.8 oz (87.9 kg)   SpO2 95%   BMI 40.50 kg/m   Opioid Risk Score:   Fall Risk Score:  `1  Depression screen PHQ 2/9  No flowsheet data found.  Review of Systems  Constitutional: Negative.   HENT: Negative.   Eyes: Negative.   Respiratory: Negative.   Cardiovascular: Negative.   Gastrointestinal: Negative.   Endocrine: Negative.   Genitourinary: Negative.    Musculoskeletal: Positive for gait problem.  Skin: Negative.   Allergic/Immunologic: Negative.   Neurological: Positive for weakness.  Hematological: Negative.   Psychiatric/Behavioral: Negative.   All other systems reviewed and are negative.      Objective:   Physical Exam Vitals signs and nursing note reviewed.  Constitutional:      Appearance: Normal appearance.  HENT:     Head: Normocephalic and atraumatic.  Neck:  Comments: Pain with rotation of the neck more to the left side than the right as well as with lateral bending.  Also has pain with extension greater than with flexion of the neck. The patient has mild tenderness palpation left side around the C2 paraspinal C3-C4 and C5.  No pain at the occiput Cardiovascular:     Heart sounds: Normal heart sounds. No murmur.  Pulmonary:     Effort: Pulmonary effort is normal. No respiratory distress.     Breath sounds: Normal breath sounds. No wheezing.  Abdominal:     General: Abdomen is flat. Bowel sounds are normal. There is no distension.     Palpations: Abdomen is soft.  Skin:    General: Skin is warm and dry.  Neurological:     General: No focal deficit present.     Mental Status: She is alert and oriented to person, place, and time.     Deep Tendon Reflexes:     Reflex Scores:      Tricep reflexes are 3+ on the right side and 3+ on the left side.      Bicep reflexes are 3+ on the right side and 3+ on the left side.      Brachioradialis reflexes are 3+ on the right side and 3+ on the left side.      Patellar reflexes are 3+ on the right side.      Achilles reflexes are 3+ on the right side and 3+ on the left side.    Comments: Motor strength is 4/5 bilateral deltoid bicep tricep grip hip flexor knee extensor ankle dorsiflexor Patient ambulates with walker no evidence of toe drag or knee stability Sensation intact to light touch and pin bilateral upper extremities.  Psychiatric:        Mood and Affect: Mood normal.         Behavior: Behavior normal.           Assessment & Plan:  1.  Rheumatoid arthritis with cervical spine involvement.  Radiologically the patient has pain at multiple levels although symptomatically it appears to be worse from C2-C5 level on the left side. We discussed that I do not perform higher cervical medial branch blocks.  She can certainly follow-up with Dr. Maryjean Ka on that but given her desire to stay within the [facility] system, have made referral to Dr. Ernestina Patches. Also patient requested physical therapy I think this is reasonable.  They can work on her gait as well as some soft tissue work in the cervical and parascapular area on the left side. I will see her back on a as needed basis.

## 2019-06-23 NOTE — Patient Instructions (Signed)
PT on [address] should call you to set up appt  Dr Midge Aver office will call you regarding cervical injections

## 2019-06-25 ENCOUNTER — Telehealth: Payer: Self-pay | Admitting: Internal Medicine

## 2019-06-25 NOTE — Telephone Encounter (Signed)
° ° °  Patient requesting lab order be faxed to [facility], Dr Sheryn Bison  Fax [phone] including diagnosis codes Phone [phone]

## 2019-06-25 NOTE — Telephone Encounter (Signed)
Prescription faxed to [facility] PCP office for lipids to be drawn with next visit there.

## 2019-07-06 ENCOUNTER — Ambulatory Visit: Payer: Medicare Other | Attending: Physical Medicine & Rehabilitation | Admitting: Physical Therapy

## 2019-07-06 ENCOUNTER — Other Ambulatory Visit: Payer: Self-pay

## 2019-07-06 ENCOUNTER — Encounter: Payer: Self-pay | Admitting: Physical Therapy

## 2019-07-06 DIAGNOSIS — R2689 Other abnormalities of gait and mobility: Secondary | ICD-10-CM | POA: Diagnosis not present

## 2019-07-06 DIAGNOSIS — M6281 Muscle weakness (generalized): Secondary | ICD-10-CM | POA: Insufficient documentation

## 2019-07-06 DIAGNOSIS — M542 Cervicalgia: Secondary | ICD-10-CM | POA: Insufficient documentation

## 2019-07-06 DIAGNOSIS — R293 Abnormal posture: Secondary | ICD-10-CM

## 2019-07-06 NOTE — Therapy (Signed)
[facility] [address]  [place], [place], [address] Phone: [phone]   Fax:  [phone]  Physical Therapy Evaluation  Patient Details  Name: Madison Holden MRN: [id number] Date of Birth: 04/09/1946 Referring Provider (PT): Alysia Penna, MD   Encounter Date: 07/06/2019  PT End of Session - 07/06/19 1407    Visit Number  1    Number of Visits  16    Date for PT Re-Evaluation  08/31/19    Authorization Type  [employer]    PT Start Time  1407    PT Stop Time  [id number]    PT Time Calculation (min)  52 min    Activity Tolerance  Patient tolerated treatment well    Behavior During Therapy  Impulsive       Past Medical History:  Diagnosis Date  . Ambulates with cane    due to gout   . Arthritis    hands  . Chest pain, unspecified 07-01-2018  per pt had 2 day chest pain and sob  6 weeks ago,, per pt no symptoms since   cardiology-- dr Salley Scarlet--  scheduled for stress test and echo 07-03-2018 prior to surgery scheduled 07-08-2018  . Diarrhea, unspecified    intermittant  . DOE (dyspnea on exertion)   . Endometrial cancer ([place])   . Essential hypertension   . Frequency of urination   . GERD (gastroesophageal reflux disease)   . Gout rheumologist-- michelle young PA ([facility])   07-01-2018 per pt flare up april 2019 , hands and wrists  . Heart murmur   . History of adenomatous polyp of colon   . History of esophageal dilatation    for stricture  . Mild nausea    per pt intermittant , no vomiting  . Mixed dyslipidemia   . Muscle weakness of extremity    bilateral upper and lower extremities due to gout  . Poor appetite    per pt   . Type 2 diabetes mellitus ([place])    followed by pcp  . Urgency of urination   . Urine frequency     Past Surgical History:  Procedure Laterality Date  . BREAST CYST EXCISION Right 1974   benign per pt  . CATARACT EXTRACTION W/ INTRAOCULAR LENS   IMPLANT, BILATERAL  2014;  2017  . MOUTH SURGERY  11/2017   Upper quadrants,  per pt peridontist did laser procedure on gums  . ROBOTIC ASSISTED TOTAL HYSTERECTOMY WITH BILATERAL SALPINGO OOPHERECTOMY Bilateral 07/22/2018   Procedure: XI ROBOTIC ASSISTED TOTAL HYSTERECTOMY WITH BILATERAL SALPINGO OOPHORECTOMY AND SENTINAL LYMPH NODE BIOPSY;  Surgeon: Everitt Amber, MD;  Location: [facility];  Service: Gynecology;  Laterality: Bilateral;  . TONSILLECTOMY  child    There were no vitals filed for this visit.   Subjective Assessment - 07/06/19 1410    Subjective  Diagnosed with gout in January 2019 and was being treated with multiple meds.  Complications from gout meds led to hospitalization last summer (2019). Also underwent hysterectomy for uterine cancer in Oct 2019. Over this period (esp after hospitalizations) she became more unsteady causing her to start using a rollator. Diagnosed with RA in January 2020. RA in cervical spine causing her a lot of neck pain - had an injection in lower neck with very minimal relief for only a few days. Awaiting consult for upper cervical spine injection with Dr. Ernestina Patches on Wed. Looking for PT to address neck pain and work of  improving steadiness with mobility.    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  <1 minute w/o AD; 5 minutes with rollator    Patient Stated Goals  "To walk w/o AD like I was pre-hospital"    Currently in Pain?  Yes    Pain Score  6    5-6/10   Pain Location  Neck    Pain Orientation  Left    Pain Descriptors / Indicators  Constant;Jabbing;Stabbing    Pain Radiating Towards  L upper shoulder    Pain Onset  More than a month ago   March 2020   Pain Frequency  Constant    Aggravating Factors   turing head to L (esp while driving), bending down to pick something up, neck extension     Pain Relieving Factors  Tylenol    Effect of Pain on Daily Activities  difficulty to turn head to look while driving; uncomfortable sitting in dentist chair or at beauty parlor sink; difficulty sleeping on L side         [facility] PT Assessment - 07/06/19 1407      Assessment   Medical Diagnosis  L sided neck pain d/t cervical spondylosis & cervical facet arthritis; Gait instabilty     Referring Provider (PT)  Alysia Penna, MD    Onset Date/Surgical Date  --   March 2020   Hand Dominance  Left    Next MD Visit  none scheduled    Prior Therapy  none      Balance Screen   Has the patient fallen in the past 6 months  No    Has the patient had a decrease in activity level because of a fear of falling?   Yes    Is the patient reluctant to leave their home because of a fear of falling?   No      Home Social worker  Private residence    Living Arrangements  Spouse/significant other    Type of Holiday to enter    Entrance Stairs-Number of Steps  2    [place]  One level    [place] - 4 wheels;Tub bench      Prior Function   Level of Independence  Independent;Independent with household mobility with device;Independent with community mobility with device;Needs assistance with ADLs;Needs assistance with homemaking    Vocation  Retired    Leisure  travel; mostly sedentary      Cognition   Overall Cognitive Status  Within Functional Limits for tasks assessed      Posture/Postural Control   Posture/Postural Control  Postural limitations    Postural Limitations  Forward head;Rounded Shoulders;Increased thoracic kyphosis   head shifted slight R of neutral spine     ROM / Strength   AROM / PROM / Strength  AROM;Strength      AROM   AROM Assessment Site  Cervical;Shoulder    Right/Left Shoulder  Right;Left    Right Shoulder Flexion  138 Degrees    Right Shoulder ABduction  122 Degrees    Left Shoulder Flexion  142  Degrees    Left Shoulder ABduction  129 Degrees    Cervical Flexion  51  Cervical Extension  38   pulls on L   Cervical - Right Side Bend  20    Cervical - Left Side Bend  19    Cervical - Right Rotation  30    Cervical - Left Rotation  23      Strength   Strength Assessment Site  Shoulder;Hand    Right/Left Shoulder  Right;Left    Right Shoulder Flexion  4/5    Right Shoulder ABduction  4/5    Right Shoulder Internal Rotation  4/5    Right Shoulder External Rotation  4-/5    Left Shoulder Flexion  4-/5    Left Shoulder ABduction  4-/5    Left Shoulder Internal Rotation  4-/5    Left Shoulder External Rotation  4-/5    Right/Left hand  Right;Left    Right Hand Gross Grasp  Impaired    Right Hand Grip (lbs)  <5#    Left Hand Gross Grasp  Impaired    Left Hand Grip (lbs)  <5#                Objective measurements completed on examination: See above findings.              PT Education - 07/06/19 1459    Education Details  PT eval findings - anticipated POC to initially focus on neck pain with plan to address balance and gait deficits upon return from traveling in Nov    Person(s) Educated  Patient;Spouse    Methods  Explanation    Comprehension  Verbalized understanding;Need further instruction       PT Short Term Goals - 07/06/19 1459      PT SHORT TERM GOAL #1   Title  Patient will be independent with initial HEP    Status  New    Target Date  08/03/19      PT SHORT TERM GOAL #2   Title  Patient will verbalize/demonstrate understanding of neutral spine posture and proper body mechanics to reduce strain on cervical spine    Status  New    Target Date  08/03/19        PT Long Term Goals - 07/06/19 1459      PT LONG TERM GOAL #1   Title  Patient will be independent with ongoing/advanced HEP    Status  New    Target Date  08/31/19      PT LONG TERM GOAL #2   Title  Patient to improve cervical AROM to [employer] without pain provocation     Status  New    Target Date  08/31/19      PT LONG TERM GOAL #3   Title  Patient to demonstrate appropriate posture and body mechanics needed for daily activities    Status  New    Target Date  08/31/19      PT LONG TERM GOAL #4   Title  Patient to report ability to perform ADLs and household tasks without increased neck pain    Status  New    Target Date  08/31/19             Plan - 07/06/19 1459    Clinical Impression Statement  Madison Holden is a 73 y/o female who reports to PT chronic neck pain related to RA originating in ~March of 2020. She reports she was first diagnosed and treated for gout last year with RA diagnosed in January 2020 but associates onset of neck pain with decreasing  activity level in March due to COVID-19, although reports overall decline since 2 hospitalizations in 2019. This decline resulted in need for new use of rollator style RW and increased dependence on her husband for assistance with ADLs and household tasks. Posture deviations significant for forward head and rounded shoulder posture with increased thoracic kyphosis with head shifted slightly R of neutral spine. Cervical AROM decreased in all planes with limited B shoulder AROM as well. B UE weakness evident, most pronounced with diminished grip strength bilaterally. Patient also expressing desire to work on PT for gait instability with hopes of weaning from rollator however time constraints on initial visit preventing full gait, balance and LE strength assessment. Patient also expressing desire to wait until November to proceed with further PT after returning from traveling in October. Encouraged patient to schedule at least a few visits in September to initiate cervical HEP, with plan to defer gait, balance and LE strength assessment until return in November.    Personal Factors and Comorbidities  Age;Behavior Pattern;Comorbidity 3+;Fitness;Past/Current Experience;Time since onset of injury/illness/exacerbation     Comorbidities  RA; gout; OA of R knee; rheumatoid aortitis; CAD; LVH; DM; HTN; obesity; GERD; h/o ARF; endometrial cancer s/p hysterectomy 07/2018    Examination-Activity Limitations  Bathing;Bed Mobility;Bend;Caring for Others;Carry;Dressing;Hygiene/Grooming;Locomotion Level;Reach Overhead;Stand;Transfers    Examination-Participation Restrictions  Cleaning;Laundry;Meal Prep    Stability/Clinical Decision Making  Evolving/Moderate complexity    Clinical Decision Making  Moderate    Rehab Potential  Fair    PT Frequency  2x / week    PT Duration  8 weeks    PT Treatment/Interventions  ADLs/Self Care Home Management;Electrical Stimulation;Iontophoresis 4mg /ml Dexamethasone;Moist Heat;Ultrasound;Gait training;Stair training;Functional mobility training;Therapeutic activities;Therapeutic exercise;Balance training;Neuromuscular re-education;Patient/family education;Manual techniques;Passive range of motion;Dry needling;Taping;Spinal Manipulations    PT Next Visit Plan  Cervical intial HEP for ROM, stretching and postural training    Consulted and Agree with Plan of Care  Patient;Family member/caregiver    Family Member Consulted  husband       Patient will benefit from skilled therapeutic intervention in order to improve the following deficits and impairments:  Abnormal gait, Decreased activity tolerance, Decreased balance, Decreased endurance, Decreased knowledge of use of DME, Decreased mobility, Decreased range of motion, Decreased safety awareness, Decreased strength, Difficulty walking, Increased muscle spasms, Impaired flexibility, Impaired sensation, Impaired UE functional use, Improper body mechanics, Postural dysfunction, Pain  Visit Diagnosis: Cervicalgia  Abnormal posture  Muscle weakness (generalized)  Other abnormalities of gait and mobility     Problem List Patient Active Problem List   Diagnosis Date Noted  . 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 Endometrial cancer ([place])  07/22/2018  . Acute renal failure (ARF) ([place]) 07/01/2018  . Endometrial carcinoma ([place]) 07/01/2018  . Gout 07/01/2018  . Hyperkalemia 07/01/2018  . Morbid obesity ([place]) 06/26/2018  . Chest pain 06/26/2018  . Pre-operative cardiovascular examination 06/26/2018  . Diabetes mellitus ([place]) 06/18/2018  . 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 CORONARY ATHEROSCLEROSIS 05/02/2009  . Mixed dyslipidemia 04/22/2009  . Essential hypertension 04/15/2008  . VENTRICULAR HYPERTROPHY, LEFT 04/15/2008  . ESOPHAGEAL STRICTURE 04/15/2008  . GERD 04/15/2008    Percival Spanish, PT, MPT 07/06/2019, 6:37 PM  [facility] [address]  [place], [place], [address] Phone: [phone]   Fax:  [phone]  Name: Madison Holden MRN: [id number] Date of Birth: 02-26-1946

## 2019-07-07 DIAGNOSIS — Z1231 Encounter for screening mammogram for malignant neoplasm of breast: Secondary | ICD-10-CM | POA: Diagnosis not present

## 2019-07-07 DIAGNOSIS — Z01419 Encounter for gynecological examination (general) (routine) without abnormal findings: Secondary | ICD-10-CM | POA: Diagnosis not present

## 2019-07-07 DIAGNOSIS — Z124 Encounter for screening for malignant neoplasm of cervix: Secondary | ICD-10-CM | POA: Diagnosis not present

## 2019-07-08 ENCOUNTER — Encounter: Payer: Self-pay | Admitting: Physical Medicine and Rehabilitation

## 2019-07-08 ENCOUNTER — Other Ambulatory Visit: Payer: Self-pay

## 2019-07-08 ENCOUNTER — Ambulatory Visit (INDEPENDENT_AMBULATORY_CARE_PROVIDER_SITE_OTHER): Payer: Medicare Other | Admitting: Physical Medicine and Rehabilitation

## 2019-07-08 ENCOUNTER — Telehealth: Payer: Self-pay | Admitting: Radiology

## 2019-07-08 DIAGNOSIS — M542 Cervicalgia: Secondary | ICD-10-CM | POA: Diagnosis not present

## 2019-07-08 DIAGNOSIS — G8929 Other chronic pain: Secondary | ICD-10-CM

## 2019-07-08 DIAGNOSIS — M47812 Spondylosis without myelopathy or radiculopathy, cervical region: Secondary | ICD-10-CM | POA: Diagnosis not present

## 2019-07-08 DIAGNOSIS — R202 Paresthesia of skin: Secondary | ICD-10-CM

## 2019-07-08 DIAGNOSIS — M25512 Pain in left shoulder: Secondary | ICD-10-CM

## 2019-07-08 NOTE — Telephone Encounter (Signed)
FYI  I scheduled patient on 07/23/19 for left C3-4 C4-5 facet block injection per Dr. Ernestina Patches request. FYI just in case authorization is needed or schedule needs to be adjusted.  Thanks!

## 2019-07-08 NOTE — Telephone Encounter (Signed)
Per pt insurance [employer] no PA is needed for [phone] and [phone] per online portal.

## 2019-07-08 NOTE — Progress Notes (Signed)
Numeric Pain Rating Scale and Functional Assessment Average Pain 5 Pain Right Now 6 My pain is constant Pain is worse with: first thing in morning and at end of day. Worse with busy day going out to appointments etc.  Pain improves with:laying down   In the last MONTH (on 0-10 scale) has pain interfered with the following?  1. General activity like being  able to carry out your everyday physical activities such as walking, climbing stairs, carrying groceries, or moving a chair?  Rating(9)  2. Relation with others like being able to carry out your usual social activities and roles such as  activities at home, at work and in your community. Rating(9)  3. Enjoyment of life such that you have  been bothered by emotional problems such as feeling anxious, depressed or irritable?  Rating(6)

## 2019-07-09 ENCOUNTER — Other Ambulatory Visit: Payer: Self-pay | Admitting: Physical Medicine and Rehabilitation

## 2019-07-09 ENCOUNTER — Telehealth: Payer: Self-pay | Admitting: Radiology

## 2019-07-09 DIAGNOSIS — F411 Generalized anxiety disorder: Secondary | ICD-10-CM

## 2019-07-09 MED ORDER — DIAZEPAM 5 MG PO TABS: ORAL_TABLET | ORAL | 0 refills | Status: DC

## 2019-07-09 NOTE — Progress Notes (Signed)
Pre-procedure diazepam ordered for pre-operative anxiety.  

## 2019-07-09 NOTE — Telephone Encounter (Signed)
Patient lmom and she is requesting something to relax her for the procedure that she is having on 07/23/2019.  She states that her husband will be bringing her

## 2019-07-09 NOTE — Telephone Encounter (Signed)
Default pharm was [facility] [place], done

## 2019-07-09 NOTE — Telephone Encounter (Signed)
I called and advised that her rx was sent to her pharmacy 

## 2019-07-10 DIAGNOSIS — I10 Essential (primary) hypertension: Secondary | ICD-10-CM | POA: Diagnosis not present

## 2019-07-10 DIAGNOSIS — E782 Mixed hyperlipidemia: Secondary | ICD-10-CM | POA: Diagnosis not present

## 2019-07-10 DIAGNOSIS — E11319 Type 2 diabetes mellitus with unspecified diabetic retinopathy without macular edema: Secondary | ICD-10-CM | POA: Diagnosis not present

## 2019-07-10 DIAGNOSIS — Z0000 Encounter for general adult medical examination without abnormal findings: Secondary | ICD-10-CM | POA: Diagnosis not present

## 2019-07-20 ENCOUNTER — Ambulatory Visit: Payer: Medicare Other | Admitting: Physical Therapy

## 2019-07-20 ENCOUNTER — Encounter: Payer: Self-pay | Admitting: Physical Therapy

## 2019-07-20 ENCOUNTER — Other Ambulatory Visit: Payer: Self-pay

## 2019-07-20 DIAGNOSIS — R293 Abnormal posture: Secondary | ICD-10-CM | POA: Diagnosis not present

## 2019-07-20 DIAGNOSIS — M542 Cervicalgia: Secondary | ICD-10-CM | POA: Diagnosis not present

## 2019-07-20 DIAGNOSIS — M6281 Muscle weakness (generalized): Secondary | ICD-10-CM | POA: Diagnosis not present

## 2019-07-20 DIAGNOSIS — R2689 Other abnormalities of gait and mobility: Secondary | ICD-10-CM | POA: Diagnosis not present

## 2019-07-20 NOTE — Therapy (Addendum)
[facility] [address]  [address] [place], [place], [address] Phone: [phone]   Fax:  [phone]  Physical Therapy Treatment / Discharge Summary  Patient Details  Name: Madison Holden MRN: [id number] Date of Birth: 10/15/1946 Referring Provider (PT): Andrew Kirsteins, MD   Encounter Date: 07/20/2019  PT End of Session - 07/20/19 1401    Visit Number  2    Number of Visits  16    Date for PT Re-Evaluation  08/31/19    Authorization Type  [employer]    PT Start Time  1401    PT Stop Time  1441    PT Time Calculation (min)  40 min    Activity Tolerance  Patient tolerated treatment well    Behavior During Therapy  WFL for tasks assessed/performed       Past Medical History:  Diagnosis Date  . Ambulates with cane    due to gout   . Arthritis    hands  . Chest pain, unspecified 07-01-2018  per pt had 2 day chest pain and sob  6 weeks ago,, per pt no symptoms since   cardiology-- dr revanker--  scheduled for stress test and echo 07-03-2018 prior to surgery scheduled 07-08-2018  . Diarrhea, unspecified    intermittant  . DOE (dyspnea on exertion)   . Endometrial cancer ([facility])   . Essential hypertension   . Frequency of urination   . GERD (gastroesophageal reflux disease)   . Gout rheumologist-- michelle young PA ([facility])   07-01-2018 per pt flare up april 2019 , hands and wrists  . Heart murmur   . History of adenomatous polyp of colon   . History of esophageal dilatation    for stricture  . Mild nausea    per pt intermittant , no vomiting  . Mixed dyslipidemia   . Muscle weakness of extremity    bilateral upper and lower extremities due to gout  . Poor appetite    per pt   . Type 2 diabetes mellitus ([facility])    followed by pcp  . Urgency of urination   . Urine frequency     Past Surgical History:  Procedure Laterality Date  . BREAST CYST EXCISION Right 1974   benign per pt  .  CATARACT EXTRACTION W/ INTRAOCULAR LENS  IMPLANT, BILATERAL  2014;  2017  . MOUTH SURGERY  11/2017   Upper quadrants,  per pt peridontist did laser procedure on gums  . ROBOTIC ASSISTED TOTAL HYSTERECTOMY WITH BILATERAL SALPINGO OOPHERECTOMY Bilateral 07/22/2018   Procedure: XI ROBOTIC ASSISTED TOTAL HYSTERECTOMY WITH BILATERAL SALPINGO OOPHORECTOMY AND SENTINAL LYMPH NODE BIOPSY;  Surgeon: Rossi, Emma, MD;  Location: [facility];  Service: Gynecology;  Laterality: Bilateral;  . TONSILLECTOMY  child    There were no vitals filed for this visit.  Subjective Assessment - 07/20/19 1403    Subjective  Pt reports she is schedeuled for a neck injection on Thursday. C/o "stiff all over". Will be [place] to [place] for the remainder of the month next week.    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  Yes    Pain Score  7    6-7/10   Pain Location  Neck    Pain Orientation  Left;Upper    Pain Descriptors / Indicators    Throbbing    Pain Type  Chronic pain    Pain Frequency  Constant                       [facility] Adult PT Treatment/Exercise - 07/20/19 1401      Self-Care   Self-Care  Posture    Posture  Educated pt on basics of neutral cervical spine and shoulder posture to reduce cervical muscle and promote nomral cervical alignment.      Exercises   Exercises  Neck      Neck Exercises: Theraband   Shoulder External Rotation  10 reps   yellow TB   Shoulder External Rotation Limitations  seated and hooklying - better performance in hooklying    Horizontal ABduction  10 reps   yellow TB   Horizontal ABduction Limitations  seated and hooklying - better performance in sitting      Neck Exercises: Seated   Neck Retraction  10 reps;5 secs    Cervical Rotation  Right;Left;10 reps    Cervical Rotation Limitations  SNAGs with rolled pillow case    Other Seated Exercise   Scap retraction + depression x 10    Other Seated Exercise  Assisted cervical extension with rolled pillow case 10 x 3"      Neck Exercises: Supine   Neck Retraction  10 reps;5 secs    Neck Retraction Limitations  into pillow - pt preferring seated version of this exercise      Neck Exercises: Stretches   Upper Trapezius Stretch  Right;Left;30 seconds;2 reps             PT Education - 07/20/19 1441    Education Details  Postural awareness & intial HEP    Person(s) Educated  Patient    Methods  Explanation;Demonstration;Handout    Comprehension  Verbalized understanding;Returned demonstration;Need further instruction       PT Short Term Goals - 07/20/19 1407      PT SHORT TERM GOAL #1   Title  Patient will be independent with initial HEP    Status  On-going    Target Date  08/03/19      PT SHORT TERM GOAL #2   Title  Patient will verbalize/demonstrate understanding of neutral spine posture and proper body mechanics to reduce strain on cervical spine    Status  On-going    Target Date  08/03/19        PT Long Term Goals - 07/20/19 1407      PT LONG TERM GOAL #1   Title  Patient will be independent with ongoing/advanced HEP    Status  On-going    Target Date  08/31/19      PT LONG TERM GOAL #2   Title  Patient to improve cervical AROM to WFL without pain provocation    Status  On-going    Target Date  08/31/19      PT LONG TERM GOAL #3   Title  Patient to demonstrate appropriate posture and body mechanics needed for daily activities    Status  On-going    Target Date  08/31/19      PT LONG TERM GOAL #4   Title  Patient to report ability to perform ADLs and household tasks without increased neck pain    Status  On-going    Target Date  08/31/19            Plan - 07/20/19 1408    Clinical Impression Statement    Anaijah returning for first visit since eval and reports she will be traveling out of town starting next week for the remainder of the month,  hence focus of today's session was on basics on neutral cervical spine and shoulder posture as well as establishment of initial HEP. Patient cautioned to avoid forcing painful motions, looking only for gentle stretching with at most mild discomfort with multiple options attempted for some exercise to find best tolerated position allowing for good return demonstration. Will plan to review initial HEP when patient returns on Friday as well as elaborate on posture and body mechanics to minimize cervical strain.    Comorbidities  RA; gout; OA of R knee; rheumatoid aortitis; CAD; LVH; DM; HTN; obesity; GERD; h/o ARF; endometrial cancer s/p hysterectomy 07/2018    Rehab Potential  Fair    PT Frequency  2x / week    PT Duration  8 weeks    PT Treatment/Interventions  ADLs/Self Care Home Management;Electrical Stimulation;Iontophoresis 62m/ml Dexamethasone;Moist Heat;Ultrasound;Gait training;Stair training;Functional mobility training;Therapeutic activities;Therapeutic exercise;Balance training;Neuromuscular re-education;Patient/family education;Manual techniques;Passive range of motion;Dry needling;Taping;Spinal Manipulations    PT Next Visit Plan  review initial cervical HEP for ROM, stretching and postural training; posture & body mechanics education    PT Home Exercise Plan  07/20/19 - chin tuck, UT stretch, AAROM cervical extension & rotation, scap retraction, scap retraction + horiz ABD & ER with yellow TB    Consulted and Agree with Plan of Care  Patient       Patient will benefit from skilled therapeutic intervention in order to improve the following deficits and impairments:  Abnormal gait, Decreased activity tolerance, Decreased balance, Decreased endurance, Decreased knowledge of use of DME, Decreased mobility, Decreased range of motion, Decreased safety awareness, Decreased strength, Difficulty walking, Increased muscle spasms, Impaired flexibility, Impaired sensation, Impaired UE functional use,  Improper body mechanics, Postural dysfunction, Pain  Visit Diagnosis: Cervicalgia  Abnormal posture  Muscle weakness (generalized)  Other abnormalities of gait and mobility     Problem List Patient Active Problem List   Diagnosis Date Noted  . 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 Endometrial cancer ([place] 07/22/2018  . Acute renal failure (ARF) ([place] 07/01/2018  . Endometrial carcinoma ([place] 07/01/2018  . Gout 07/01/2018  . Hyperkalemia 07/01/2018  . Morbid obesity ([place] 06/26/2018  . Chest pain 06/26/2018  . Pre-operative cardiovascular examination 06/26/2018  . Diabetes mellitus ([place] 06/18/2018  . 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 CORONARY ATHEROSCLEROSIS 05/02/2009  . Mixed dyslipidemia 04/22/2009  . Essential hypertension 04/15/2008  . VENTRICULAR HYPERTROPHY, LEFT 04/15/2008  . ESOPHAGEAL STRICTURE 04/15/2008  . GERD 04/15/2008    JPercival Spanish PT, MPT 07/20/2019, 9:08 PM  [facility] [place] [address]Phone: [phone]  Fax:  [phone] Name: SJaleigha DeaneMRN: [id number] of Birth: [birth year] PHYSICAL THERAPY DISCHARGE SUMMARY  Visits from Start of Care: 2  Current functional level related to goals / functional outcomes:   Refer to above clinical impression for status as of last visit on 07/20/2019. Patient was placed on hold for 30 days at her request due to traveling during the month of October, however has not returned to PT since, therefore will proceed with discharge from PT for this episode.   Remaining deficits:   As above. Unable to formally assess due to failure to return to PT.   Education / Equipment:   Initial HEP  Plan: Patient agrees to discharge.  Patient goals were not met. Patient is being discharged due to not returning since the last visit.  ?????     Percival Spanish, PT, MPT 09/10/19, 8:22 AM  [facility] [place], [place], [address] Phone: [phone]   Fax:  [phone]

## 2019-07-20 NOTE — Patient Instructions (Signed)
    Home exercise program created by Dannielle Baskins, PT.  For questions, please contact Omari Mcmanaway via phone at [phone] or email at [email or web address]  [facility] [address]  [address] [place], [place], [address] Phone: [phone]   Fax:  [phone]    

## 2019-07-20 NOTE — Progress Notes (Signed)
Patient ID: Madison Holden [facility]                 DOB: [birth year]                      MRN: [id number]     HPI: Madison Holden is a 73 y.o. female referred by Dr. Harrington Challenger to HTN clinic. PMH is significant for HTN, CAD, GERD, DM, HLD, obesity, chest pain, gout, and endometrial cancer. At last appt with Dr. Harrington Challenger, pt reports that her home systolic BP readings range between 110 to 140s mmHg, however, BP was 151/89 mmHg at the office.  Pt presents for initial appt with HTN clinic with husband; permission given. Pt has not been experiencing s/sx of hypotension. She reports adherence and tolerance to amlodipine 2.5 mg daily and HCTZ 12.5 mg daily. She takes BP meds at 10 AM daily. Patient states she notices her BP readings are higher when she is experiencing neck pain or arthralgia d/t rheumatoid arthritis. She states she is following with [facility] for management of neck pain and has an appt this Thursday October 1st, 2020 to schedule procedure for neck pain.   Pt states she recently had blood drawn for cholesterol panel and would like to know results. Patient states she has been on statin therapy in the past, however, experienced muscle pain. Patient remembers 3 different trials, but only remembers taking atorvastatin. She states her PCP Dr. Sheryn Bison has prescribed Zetia 10 mg daily, but she has not taken it yet.  Current HTN meds: amlodipine 2.5 mg daily, HCTZ 12.5 mg daily Previously tried: lisinopril 20 mg daily (hx of hyperkalemia in 06/2018), verapamil 180 mg CR and 240 mg CR daily (on hold d/t "soft BP" in 06/2018) BP goal: <130/80 mmHg  Family History: father (clotting disorder, heart disease, prostate cancer), mother (HF), maternal grandfather (DM)  Social History: never smoked, decreased alcohol use to every 2 weeks   Diet: eats ~ 2 meals/day Breakfast: bagel with butter and jam or cheese toast, fruit (apples or oranges) Around 3PM: fish about 1-2 weeks, spinach or aspargus,  baked potato or rice  Snacks: sweets (typically at night) Drinks: juice, coffee  Exercise: limited d/t pain  Home BP readings:   Calibrated BP cuff at office today (07/21/19);  automated BP cuff appears ~5 mmHg higher than manual BP cuff.  September 2020   BP (mmHg)  Pulse (bpm) (time)  136/82  100 Unknown time 140/99  81 (10:30 AM) -- pain 140/71  73 (11:00 PM) -- pain 124/84  82 (9:00 AM) 138/82  72 (8:00 PM) 144/77  86 (10:30 AM) 134/74  81 (9:00 PM) 137/77  71  (8:00 AM)  134/74  78 (11:00 AM) -- calibrated at Dr. Gabriel Carina 118/67  76 (4:00 PM) 130/72   60 Unknown time 138/82   80 (10:00 AM) 139/88   95 (2:00 PM) -- pain  127/54   84 (8:00 PM) 114/60   80  (9:00 AM) 123/64   85 (9:00 PM)  Lipid panel 07/10/19 [facility] at [facility]): TC 165 TG 158 HDL 42 LDL 91 Non-HDL 123 Chol/HDL ratio 3.9; no HLD medications   Wt Readings from Last 3 Encounters:  06/23/19 193 lb 12.8 oz (87.9 kg)  06/22/19 192 lb 12.8 oz (87.5 kg)  02/06/19 188 lb (85.3 kg)   BP Readings from Last 3 Encounters:  06/23/19 136/82  06/22/19 (!) 151/89  03/12/19 133/77   Pulse Readings from Last  3 Encounters:  06/23/19 100  06/22/19 (!) 102  03/12/19 70    Renal function: CrCl cannot be calculated (Patient's most recent lab result is older than the maximum 21 days allowed.).  Past Medical History:  Diagnosis Date  . Ambulates with cane    due to gout   . Arthritis    hands  . Chest pain, unspecified 07-01-2018  per pt had 2 day chest pain and sob  6 weeks ago,, per pt no symptoms since   cardiology-- dr Salley Scarlet--  scheduled for stress test and echo 07-03-2018 prior to surgery scheduled 07-08-2018  . Diarrhea, unspecified    intermittant  . DOE (dyspnea on exertion)   . Endometrial cancer ([place])   . Essential hypertension   . Frequency of urination   . GERD (gastroesophageal reflux disease)   . Gout rheumologist-- michelle young PA ([facility])   07-01-2018 per pt flare up  april 2019 , hands and wrists  . Heart murmur   . History of adenomatous polyp of colon   . History of esophageal dilatation    for stricture  . Mild nausea    per pt intermittant , no vomiting  . Mixed dyslipidemia   . Muscle weakness of extremity    bilateral upper and lower extremities due to gout  . Poor appetite    per pt   . Type 2 diabetes mellitus ([place])    followed by pcp  . Urgency of urination   . Urine frequency     Current Outpatient Medications on File Prior to Visit  Medication Sig Dispense Refill  . acetaminophen (TYLENOL) 650 MG CR tablet Take 650 mg by mouth every 8 (eight) hours as needed for pain.    Marland Kitchen allopurinol (ZYLOPRIM) 300 MG tablet Take 300 mg by mouth daily.    Marland Kitchen amLODipine (NORVASC) 2.5 MG tablet Take 1 tablet (2.5 mg total) by mouth daily. 90 tablet 3  . ASA-APAP-Caff Buffered (VANQUISH PO) Take by mouth.    . B-D TB SYRINGE 1CC/27GX1/2" 27G X 1/2" 1 ML MISC USE WEEKLY WITH METHOTREXATE SUBCUTANEOUSLY 90 DAYS    . diazepam (VALIUM) 5 MG tablet Take 1 by mouth 1 hour  pre-procedure with very light food. May bring 2nd tablet to appointment. 2 tablet 0  . diphenhydrAMINE (BENADRYL) 25 MG tablet Take 25 mg by mouth at bedtime as needed for sleep.     Scarlette Shorts SURECLICK 50 MG/ML injection 25 mg as directed. Two times a week    . esomeprazole (NEXIUM) 20 MG capsule Take 20 mg by mouth 3 (three) times a week.    . ferrous gluconate (FERGON) 324 MG tablet TAKE 1 TABLET (324 MG TOTAL) BY MOUTH 2 (TWO) TIMES DAILY WITH A MEAL. 60 tablet 1  . folic acid (FOLVITE) Q000111Q MCG tablet Take 400 mcg by mouth daily.    . hydrochlorothiazide (HYDRODIURIL) 25 MG tablet Take 12.5 mg by mouth daily.    . Magnesium 500 MG TABS Take 500 mg by mouth every other day.    . Melatonin 3 MG TABS Take 3-6 mg by mouth at bedtime as needed (sleep).     . methotrexate 250 MG/10ML injection once a week. 0.9    . oxybutynin (DITROPAN) 5 MG tablet Take 5 mg by mouth daily. May take a second 5  mg dose as needed for bladder spasms    . pioglitazone (ACTOS) 30 MG tablet Take 30 mg by mouth daily.     . Turmeric (  QC TUMERIC COMPLEX) 500 MG CAPS Take by mouth.    . vitamin B-12 (CYANOCOBALAMIN) 1000 MCG tablet Take 1,000 mcg by mouth daily.    . vitamin C (ASCORBIC ACID) 500 MG tablet Take 500 mg by mouth 4 (four) times a week.     No current facility-administered medications on file prior to visit.     No Known Allergies   Assessment/Plan:   1. Hypertension - goal BP <130/80 mmHg; therefore, considering pt's automated BP cuff appears ~5 mmHg higher than manual BP cuff it is likely patient is at goal. Patient appears to be tolerating current BP regimen. Will f/u with pt after neck procedure to re-assess BP readings. Plan to continue amlodipine 2.5 mg daily and HCTZ 12.5 mg daily at this time.   2. Hyperlipidemia - goal LDL < 70 mg/dL; therefore, pt is not at goal. Educated pt thoroughly about her options (statin vs PSCK9i vs zetia vs bempedoic acid). Patient stated she is comfortable re-starting statin therapy. Initiate rosuvastatin 5 mg daily Monday on 07/27/19. Educated pt thoroughly on efficacy, administration, side effects, and dosing. If you are tolerating rosuvastatin then after 2 weeks you can increase dose to rosuvastatin 10 mg daily on 08/10/19. Plan to follow up with pt on 08/24/19 at 3:00 PM.  Informed pt to notify Dr. Sheryn Bison she following with cardiology for lipid management and do not need Zetia at this time. Patient verbalized understanding to treatment plan.   Thank you for involving pharmacy to assist in providing Ms. Pulse's care.   Drexel Iha, PharmD PGY2 Ambulatory Care Pharmacy Resident

## 2019-07-20 NOTE — Patient Instructions (Addendum)
It was a pleasure seeing you in clinic today Ms. Maranto!  Today the plan is...  1. Start rosuvastatin 5 mg daily Monday on 07/27/19. If you are tolerating rosuvastatin then after 2 weeks you can increase dose to rosuvastatin 10 mg daily on 08/10/19. Drexel Iha (pharmacist) will follow up with you to see if you are experiencing any muscle pain from dose increase and determine other treatment options if necessary when you get back from vacation on 08/24/19 at 3:00 PM.   2. Let Dr. Sheryn Bison know you will be following with cardiology for your cholesterol management and do not need zetia at this time.  3. Automated BP cuff appears ~5 mmHg higher than manual BP cuff. We will follow up after your neck procedure to see how your blood pressure readings are.   Please call the PharmD clinic at [phone] if you have any questions that you would like to speak with a pharmacist about [facility], [place], [place]).

## 2019-07-21 ENCOUNTER — Other Ambulatory Visit: Payer: Self-pay

## 2019-07-21 ENCOUNTER — Ambulatory Visit (INDEPENDENT_AMBULATORY_CARE_PROVIDER_SITE_OTHER): Payer: Medicare Other | Admitting: Pharmacist

## 2019-07-21 VITALS — BP 138/84 | HR 83

## 2019-07-21 DIAGNOSIS — E782 Mixed hyperlipidemia: Secondary | ICD-10-CM

## 2019-07-21 DIAGNOSIS — I10 Essential (primary) hypertension: Secondary | ICD-10-CM | POA: Diagnosis not present

## 2019-07-21 MED ORDER — ROSUVASTATIN CALCIUM 5 MG PO TABS: 5.0000 mg | ORAL_TABLET | Freq: Every day | ORAL | 3 refills | Status: DC

## 2019-07-22 ENCOUNTER — Ambulatory Visit: Payer: Medicare Other | Admitting: Physical Therapy

## 2019-07-23 ENCOUNTER — Encounter: Payer: Self-pay | Admitting: Physical Medicine and Rehabilitation

## 2019-07-23 ENCOUNTER — Ambulatory Visit (INDEPENDENT_AMBULATORY_CARE_PROVIDER_SITE_OTHER): Payer: Medicare Other | Admitting: Physical Medicine and Rehabilitation

## 2019-07-23 ENCOUNTER — Other Ambulatory Visit: Payer: Self-pay

## 2019-07-23 ENCOUNTER — Ambulatory Visit: Payer: Self-pay

## 2019-07-23 ENCOUNTER — Ambulatory Visit: Payer: Medicare Other | Admitting: Physical Therapy

## 2019-07-23 VITALS — BP 165/81 | HR 75

## 2019-07-23 DIAGNOSIS — M47812 Spondylosis without myelopathy or radiculopathy, cervical region: Secondary | ICD-10-CM | POA: Diagnosis not present

## 2019-07-23 MED ORDER — METHYLPREDNISOLONE ACETATE 80 MG/ML IJ SUSP: 80.0000 mg | Freq: Once | INTRAMUSCULAR | Status: DC

## 2019-07-23 NOTE — Progress Notes (Signed)
 .  Numeric Pain Rating Scale and Functional Assessment Average Pain 6   In the last MONTH (on 0-10 scale) has pain interfered with the following?  1. General activity like being  able to carry out your everyday physical activities such as walking, climbing stairs, carrying groceries, or moving a chair?  Rating(7)   +Driver, -BT, -Dye Allergies.  

## 2019-07-24 ENCOUNTER — Ambulatory Visit: Payer: Medicare Other | Admitting: Physical Therapy

## 2019-07-24 ENCOUNTER — Ambulatory Visit: Payer: Medicare Other

## 2019-07-27 NOTE — Progress Notes (Signed)
Madison Holden [facility] - 73 y.o. female MRN [id number]  Date of birth: [birth year]  Office Visit Note: Visit Date: 07/23/2019 PCP: Aura Dials, MD Referred by: Orpah Melter, MD  Subjective: Chief Complaint  Patient presents with  . Neck - Pain  . Left Shoulder - Pain   HPI:  Madison Holden is a 73 y.o. female who comes in today For planned left-sided facet joint block at C3-4 and C4-5.  Pain started in March 2019 please see our prior notes for further details justification.  ROS Otherwise per HPI.  Assessment & Plan: Visit Diagnoses:  1. Cervical spondylosis without myelopathy     Plan: No additional findings.   Meds & Orders:  Meds ordered this encounter  Medications  . methylPREDNISolone acetate (DEPO-MEDROL) injection 80 mg    Orders Placed This Encounter  Procedures  . Facet Injection  . XR C-ARM NO REPORT    Follow-up: No follow-ups on file.   Procedures: No procedures performed  Diagnostic Cervical Facet Joint Nerve Block with Fluoroscopic Guidance  Patient: Madison Holden      Date of Birth: 1946/09/27 MRN: [id number] PCP: Aura Dials, MD      Visit Date: 07/23/2019   Universal Protocol:    Date/Time: 10/05/205:27 AM  Consent Given By: the patient  Position: LATERAL  Additional Comments: Vital signs were monitored before and after the procedure. Patient was prepped and draped in the usual sterile fashion. The correct patient, procedure, and site was verified.   Injection Procedure Details:  Procedure Site One Meds Administered:  Meds ordered this encounter  Medications  . methylPREDNISolone acetate (DEPO-MEDROL) injection 80 mg     Laterality: Left  Location/Site:  C3-4 C4-5  Needle size: 22 G  Needle type: spinal needle  Needle Placement: Articular Pillar  Findings:  -Contrast Used: 0.5 mL iohexol 180 mg iodine/mL   -Comments: Excellent flow of contrast over the articular pillars noted without intravascular flow  Procedure Details: The fluoroscope beam was manipulated to achieve the best "true" lateral view possible by squaring off the endplates with cranial and caudal tilt and using varying obliquity to achieve the a view with the longest length of spinous process.  The region overlying the facet joints mentioned above were then localized under fluoroscopic visualization.  The needle was inserted down to the center of the "trapezoid" outline of the facet joint lateral mass. Bi-planar images were used for confirming placement and spot radiographs were documented.  A 0.25 ml volume of Omnipaque-240 was injected to look for vascular uptake. A 0.5 ml. volume of the anesthetic solution was injected onto the target. This procedure was repeated for each medial branch nerve injected.  Prior to the procedure, the patient was given a Pain Diary which was completed for baseline measurements.  After the procedure, the patient rated their pain every 30 minutes and will continue rating at this frequency for a total of 5 hours.  The patient has been asked to complete the Diary and return to [place] by mail, fax or hand delivered as soon as possible.   Additional Comments:  The patient tolerated the procedure well Dressing: Band-Aid    Post-procedure details: Patient was observed during the procedure. Post-procedure instructions were reviewed.  Patient left the clinic in stable condition.     Clinical History: EXAM: MRI CERVICAL SPINE WITHOUT CONTRAST  TECHNIQUE: Multiplanar, multisequence MR imaging of the cervical spine was performed. No intravenous contrast was administered.  COMPARISON: None.  FINDINGS: Alignment: Trace anterolisthesis of  C4 on C5.  Vertebrae: No fracture or suspicious osseous lesion. Asymmetric left-sided C1-2 arthropathy with edema and a joint effusion. Mild ligamentous thickening about the dens.  Cord: Normal signal.  Posterior Fossa, vertebral arteries, paraspinal tissues:  Partially empty sella. Preserved vertebral artery flow voids.  Disc levels:  C2-3: Facet arthrosis without disc herniation or stenosis.  C3-4: Mild right and severe left facet arthrosis without disc herniation or stenosis.  C4-5: Anterolisthesis with bulging uncovered disc and severe bilateral facet arthrosis result in mild spinal stenosis and mild bilateral neural foraminal stenosis.  C5-6: Mild uncovertebral spurring, minimal disc bulging, and moderate to severe left greater than right facet arthrosis without significant stenosis.  C6-7: Mild disc bulging and minimal uncovertebral spurring without significant stenosis.  C7-T1: Mild right and moderate left facet arthrosis without disc herniation or stenosis.  IMPRESSION: 1. C1-2 arthropathy with prominent left-sided edema and joint effusion. 2. Severe C4-5 facet arthrosis with trace anterolisthesis, mild spinal stenosis, and mild neural foraminal stenosis. 3. Mild disc and advanced facet arthrosis elsewhere without significant stenosis.   Electronically Signed  By: Logan Bores M.D.  On: 04/21/2019 16:17     Objective:  VS:  HT:    WT:   BMI:     BP:(!) 165/81  HR:75bpm  TEMP: ( )  RESP:  Physical Exam  Ortho Exam Imaging: No results found.

## 2019-07-27 NOTE — Progress Notes (Signed)
Madison Holden - 73 y.o. female MRN [id number]  Date of birth: 1946-09-27  Office Visit Note: Visit Date: 07/08/2019 PCP: Aura Dials, MD Referred by: Orpah Melter, MD  Subjective: Chief Complaint  Patient presents with   Neck - Pain   HPI: Madison Holden is a 73 y.o. female who comes in today For consultation a new patient evaluation management of severe left-sided neck and shoulder pain with paresthesia.  Patient referred by Dr. Alysia Penna.  Patient with very complicated history at this point with trying to seek help with her left-sided neck and shoulder pain.  We reviewed multiple notes from various providers along with the patient trying to get the full history of her care to this date.  Trying to boil this down and make it brief, in the fall of last year she was having considerable amount of neck pain particularly on the left referring into the shoulder occasional paresthesia much less pain on the right.  Worse with neck rotation.  During that time she had had work-up for rheumatoid arthritis and then went on to have an episode of acute renal failure which was treated.  She was also found to have endometrial cancer which required a procedure.  She seemed to have multiple medical issues through that time.  She was initially referred for her neck pain to Dr. Laroy Apple at [place].  This is when the MRI of the cervical spine was performed.  That is reviewed with the patient today and reviewed below.  Part of this I am having to guess but because of the C1-2 changes do to rheumatoid arthritis and seen on the MRI it appears that Dr. Ron Agee referred her to Dr. Maryjean Ka usually because the C1-2 region is something the only is looked at from an interventional standpoint by few people typically the surgery center and sometimes with CT scanning because of the nature of the vertebral artery at that level.  Nonetheless she had a very hard time getting in to see Dr.  Maryjean Ka.  Ultimately she saw Dr. Maryjean Ka nurse practitioner.  She was left very disappointed at that point because it was taking so long to get any treatment that ultimately she did get a referral to Dr. Alysia Penna because she wanted to keep her care within [facility] health.  At that point Dr. Letta Pate referred her here because he does not do much in the way of upper cervical injections.  We spent 45 minutes today talking about her cervical pain and shoulder pain along with rheumatoid arthritis and changes of the cervical spine and specific treatments.  She is very frustrated with how long it has taken to get any sort of care at this point.  She has started physical therapy or has appointment for physical therapy.  She wanted to wait until she saw [place] for evaluation.  Her case is also complicated by diabetes.  She does take Enbrel.  She is not on any blood thinners.  She does use tumeric.  She has not had any focal upper extremity weakness.  She reports constant 5 out of 10 neck pain worse first thing in the morning and end of the day but also with movement.  She gets better pain control at rest and lying down.  Review of Systems  Constitutional: Positive for malaise/fatigue. Negative for chills, fever and weight loss.  HENT: Negative for hearing loss and sinus pain.   Eyes: Negative for blurred vision, double vision and photophobia.  Respiratory:  Negative for cough and shortness of breath.   Cardiovascular: Negative for chest pain, palpitations and leg swelling.  Gastrointestinal: Negative for abdominal pain, nausea and vomiting.  Genitourinary: Negative for flank pain.  Musculoskeletal: Positive for back pain, joint pain and neck pain. Negative for myalgias.  Skin: Negative for itching and rash.  Neurological: Positive for tingling. Negative for tremors, focal weakness and weakness.  Endo/Heme/Allergies: Negative.   Psychiatric/Behavioral: Negative for depression.  All other systems reviewed and  are negative.  Otherwise per HPI.  Assessment & Plan: Visit Diagnoses:  1. Cervicalgia   2. Cervical spondylosis without myelopathy   3. Chronic left shoulder pain   4. Paresthesia of skin     Plan: Findings:  Chronic left severe neck pain referral into the top of the shoulder trapezius area without much radicular referral down the arms or paresthesias to a great degree.  Case is complicated by diabetes, rheumatoid arthritis and recent hospitalizations for kidney failure and endometrial cancer last fall.  She has had a difficult time getting treatment and I think part of that is just been in the system itself.  We are happy to see her today and I discussed the problems with the C1-2 treatment.  Injection at this level could be performed under CT guidance perhaps at [facility] radiology or somewhere else.  Dr. Clydell Hakim may be able to do this at the surgery center with fluoroscopic guidance if it something he does quite frequently.  We can however treat the facet arthritis below this level.  She has pretty significant arthritis much more left than right really at C3-4 C4-5 and C5-6.  Somewhat at C2-3 as well.  She does not have much in the way of central stenosis or high-grade foraminal stenosis.  I think would be a great idea for her to start and continue physical therapy.  We will get her in for diagnostic facet joint blocks.  We will give her preprocedure Valium.    Meds & Orders: No orders of the defined types were placed in this encounter.  No orders of the defined types were placed in this encounter.   Follow-up: Return for Left C3-4, C4-5 facet joint block.   Procedures: No procedures performed  No notes on file   Clinical History: EXAM: MRI CERVICAL SPINE WITHOUT CONTRAST  TECHNIQUE: Multiplanar, multisequence MR imaging of the cervical spine was performed. No intravenous contrast was administered.  COMPARISON: None.  FINDINGS: Alignment: Trace anterolisthesis of C4 on  C5.  Vertebrae: No fracture or suspicious osseous lesion. Asymmetric left-sided C1-2 arthropathy with edema and a joint effusion. Mild ligamentous thickening about the dens.  Cord: Normal signal.  Posterior Fossa, vertebral arteries, paraspinal tissues: Partially empty sella. Preserved vertebral artery flow voids.  Disc levels:  C2-3: Facet arthrosis without disc herniation or stenosis.  C3-4: Mild right and severe left facet arthrosis without disc herniation or stenosis.  C4-5: Anterolisthesis with bulging uncovered disc and severe bilateral facet arthrosis result in mild spinal stenosis and mild bilateral neural foraminal stenosis.  C5-6: Mild uncovertebral spurring, minimal disc bulging, and moderate to severe left greater than right facet arthrosis without significant stenosis.  C6-7: Mild disc bulging and minimal uncovertebral spurring without significant stenosis.  C7-T1: Mild right and moderate left facet arthrosis without disc herniation or stenosis.  IMPRESSION: 1. C1-2 arthropathy with prominent left-sided edema and joint effusion. 2. Severe C4-5 facet arthrosis with trace anterolisthesis, mild spinal stenosis, and mild neural foraminal stenosis. 3. Mild disc and advanced facet arthrosis  elsewhere without significant stenosis.   Electronically Signed  By: Logan Bores M.D.  On: 04/21/2019 16:17   She reports that she has never smoked. She has never used smokeless tobacco. No results for input(s): HGBA1C, LABURIC in the last 8760 hours.  Objective:  VS:  HT:     WT:    BMI:      BP:    HR: bpm   TEMP: ( )   RESP:  Physical Exam Vitals signs and nursing note reviewed.  Constitutional:      General: She is not in acute distress.    Appearance: Normal appearance. She is well-developed. She is obese.  HENT:     Head: Normocephalic and atraumatic.     Nose: Nose normal.     Mouth/Throat:     Mouth: Mucous membranes are moist.     Pharynx: Oropharynx is  clear.  Eyes:     Conjunctiva/sclera: Conjunctivae normal.     Pupils: Pupils are equal, round, and reactive to light.  Neck:     Musculoskeletal: Neck supple. No neck rigidity.  Cardiovascular:     Rate and Rhythm: Regular rhythm.  Pulmonary:     Effort: Pulmonary effort is normal. No respiratory distress.  Abdominal:     General: There is no distension.     Palpations: Abdomen is soft.     Tenderness: There is no guarding.  Musculoskeletal:     Right lower leg: No edema.     Left lower leg: No edema.     Comments: Examination of the cervical spine shows forward flexed cervical spine pain with rotation left more than right with decreased range of motion to the left.  Negative Spurling's test bilaterally.  Some focal trigger points in the trapezius and levator scapula on the left more than right.  Mild shoulder impingement signs on the left compared to right.  Good strength in the upper extremities.  Negative Hoffmann's test.  Lymphadenopathy:     Cervical: No cervical adenopathy.  Skin:    General: Skin is warm and dry.     Findings: No erythema or rash.  Neurological:     General: No focal deficit present.     Mental Status: She is alert and oriented to person, place, and time.     Motor: No abnormal muscle tone.     Coordination: Coordination normal.     Gait: Gait abnormal.  Psychiatric:        Mood and Affect: Mood normal.        Behavior: Behavior normal.        Thought Content: Thought content normal.     Ortho Exam Imaging: No results found.  Past Medical/Family/Surgical/Social History: Medications & Allergies reviewed per EMR, new medications updated. 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   Endometrial cancer ([place]) 07/22/2018   Acute renal failure (ARF) ([place]) 07/01/2018   Endometrial carcinoma ([place]) 07/01/2018   Gout 07/01/2018   Hyperkalemia 07/01/2018   Morbid obesity ([place]) 06/26/2018   Chest pain  06/26/2018   Pre-operative cardiovascular examination 06/26/2018   Diabetes mellitus ([place]) 06/18/2018   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   CORONARY ATHEROSCLEROSIS 05/02/2009   Mixed dyslipidemia 04/22/2009   Essential hypertension 04/15/2008   VENTRICULAR HYPERTROPHY, LEFT 04/15/2008   ESOPHAGEAL STRICTURE 04/15/2008   GERD 04/15/2008   Past Medical History:  Diagnosis Date   Ambulates with cane    due to gout    Arthritis    hands  Chest pain, unspecified 07-01-2018  per pt had 2 day chest pain and sob  6 weeks ago,, per pt no symptoms since   cardiology-- dr Salley Scarlet--  scheduled for stress test and echo 07-03-2018 prior to surgery scheduled 07-08-2018   Diarrhea, unspecified    intermittant   DOE (dyspnea on exertion)    Endometrial cancer ([place])    Essential hypertension    Frequency of urination    GERD (gastroesophageal reflux disease)    Gout rheumologist-- michelle young PA ([facility])   07-01-2018 per pt flare up april 2019 , hands and wrists   Heart murmur    History of adenomatous polyp of colon    History of esophageal dilatation    for stricture   Mild nausea    per pt intermittant , no vomiting   Mixed dyslipidemia    Muscle weakness of extremity    bilateral upper and lower extremities due to gout   Poor appetite    per pt    Type 2 diabetes mellitus ([place])    followed by pcp   Urgency of urination    Urine frequency    Family History  Problem Relation Age of Onset   Heart disease Mother    Heart failure Mother    Heart disease Father    Prostate cancer Father    Clotting disorder Father    Diabetes Maternal Grandfather    Colon cancer Neg Hx    Past Surgical History:  Procedure Laterality Date   BREAST CYST EXCISION Right 1974   benign per pt   CATARACT EXTRACTION W/ INTRAOCULAR LENS  IMPLANT, BILATERAL  2014;  2017   MOUTH SURGERY  11/2017   Upper quadrants,  per pt peridontist did laser  procedure on gums   ROBOTIC ASSISTED TOTAL HYSTERECTOMY WITH BILATERAL SALPINGO OOPHERECTOMY Bilateral 07/22/2018   Procedure: XI ROBOTIC ASSISTED TOTAL HYSTERECTOMY WITH BILATERAL SALPINGO OOPHORECTOMY AND SENTINAL LYMPH NODE BIOPSY;  Surgeon: Everitt Amber, MD;  Location: [facility];  Service: Gynecology;  Laterality: Bilateral;   TONSILLECTOMY  child   Social History   Occupational History   Occupation: retired  Tobacco Use   Smoking status: Never Smoker   Smokeless tobacco: Never Used  Substance and Sexual Activity   Alcohol use: Not Currently    Comment: 2-3 per  week, not currently drinking since Apri; 2019   Drug use: No   Sexual activity: Not on file

## 2019-07-27 NOTE — Procedures (Signed)
Diagnostic Cervical Facet Joint Nerve Block with Fluoroscopic Guidance  Patient: Madison Holden      Date of Birth: April 11, 1946 MRN: [id number] PCP: Aura Dials, MD      Visit Date: 07/23/2019   Universal Protocol:    Date/Time: 10/05/205:27 AM  Consent Given By: the patient  Position: LATERAL  Additional Comments: Vital signs were monitored before and after the procedure. Patient was prepped and draped in the usual sterile fashion. The correct patient, procedure, and site was verified.   Injection Procedure Details:  Procedure Site One Meds Administered:  Meds ordered this encounter  Medications  . methylPREDNISolone acetate (DEPO-MEDROL) injection 80 mg     Laterality: Left  Location/Site:  C3-4 C4-5  Needle size: 22 G  Needle type: spinal needle  Needle Placement: Articular Pillar  Findings:  -Contrast Used: 0.5 mL iohexol 180 mg iodine/mL   -Comments: Excellent flow of contrast over the articular pillars noted without intravascular flow  Procedure Details: The fluoroscope beam was manipulated to achieve the best "true" lateral view possible by squaring off the endplates with cranial and caudal tilt and using varying obliquity to achieve the a view with the longest length of spinous process.  The region overlying the facet joints mentioned above were then localized under fluoroscopic visualization.  The needle was inserted down to the center of the "trapezoid" outline of the facet joint lateral mass. Bi-planar images were used for confirming placement and spot radiographs were documented.  A 0.25 ml volume of Omnipaque-240 was injected to look for vascular uptake. A 0.5 ml. volume of the anesthetic solution was injected onto the target. This procedure was repeated for each medial branch nerve injected.  Prior to the procedure, the patient was given a Pain Diary which was completed for baseline measurements.  After the procedure, the patient rated their pain  every 30 minutes and will continue rating at this frequency for a total of 5 hours.  The patient has been asked to complete the Diary and return to [place] by mail, fax or hand delivered as soon as possible.   Additional Comments:  The patient tolerated the procedure well Dressing: Band-Aid    Post-procedure details: Patient was observed during the procedure. Post-procedure instructions were reviewed.  Patient left the clinic in stable condition.

## 2019-07-29 ENCOUNTER — Telehealth: Payer: Self-pay | Admitting: Physical Medicine and Rehabilitation

## 2019-07-29 DIAGNOSIS — M47812 Spondylosis without myelopathy or radiculopathy, cervical region: Secondary | ICD-10-CM

## 2019-07-29 NOTE — Telephone Encounter (Signed)
Referral placed.

## 2019-07-29 NOTE — Telephone Encounter (Signed)
Ok to place referral to [place], she has already seen his PA/NP but she needs C1-2 injection if he does them. Or, he can eval accordingly, see if my notes dictated?

## 2019-08-24 ENCOUNTER — Telehealth: Payer: Self-pay | Admitting: Pharmacist

## 2019-08-24 NOTE — Telephone Encounter (Signed)
Called pt on 08/24/2019 regarding toleration of rosuvastatin dose. Patient did not answer. Plan to f/u again with patient tomorrow 08/25/2019.  Drexel Iha, PharmD PGY2 Ambulatory Care Pharmacy Resident

## 2019-08-25 NOTE — Telephone Encounter (Signed)
Patient told me she would not be available for another two weeks. She stated that she did not increase rosuvastatin yet and wanted to discuss it with you first.

## 2019-08-28 ENCOUNTER — Telehealth: Payer: Self-pay | Admitting: Pharmacist

## 2019-08-28 NOTE — Telephone Encounter (Signed)
Called pt on 08/28/2019 at 2:53 PM and left HIPAA-compliant VM with instructions to call [facility] clinic back   Plan to f/u regarding toleration of rosuvastatin dose.   Drexel Iha, PharmD PGY2 Ambulatory Care Pharmacy Resident

## 2019-08-31 ENCOUNTER — Telehealth: Payer: Self-pay | Admitting: Pharmacist

## 2019-08-31 DIAGNOSIS — E782 Mixed hyperlipidemia: Secondary | ICD-10-CM

## 2019-08-31 MED ORDER — ROSUVASTATIN CALCIUM 10 MG PO TABS: 10.0000 mg | ORAL_TABLET | Freq: Every day | ORAL | 4 refills | Status: DC

## 2019-08-31 NOTE — Addendum Note (Signed)
Addended by: Ellwood Handler on: 08/31/2019 04:07 PM   Modules accepted: Orders

## 2019-08-31 NOTE — Telephone Encounter (Signed)
Called pt on 08/31/2019 at 2:35PM and left HIPAA-compliant VM with instructions to call [place] clinic back   Plan to discuss increasing rosuvastatin dose  Drexel Iha, PharmD PGY2 Ambulatory Care Pharmacy Resident

## 2019-08-31 NOTE — Telephone Encounter (Signed)
Called pt on 08/31/2019 at 3:51 PM.   Pt states she has recently received an injection in the neck for rheumatoid arthritis, however, states it was not helpful. She states the injection has made her pain worse. She is being referred to a different doctor to help manage her rheumatoid arthritis in ~2 weeks.  She is on rosuvastatin 5 mg daily. She has been staying vigilant watching for side effects from statin therapy. She does not think she has experienced any side effects from rosuvastatin. She is willing to increase from rosuvastatin 5 mg daily to rosuvastatin 10 mg daily. Patient asked for a new prescription to be sent in for rosuvastatin 10 mg daily for 90 days to [facility] in [place].  Patient is not currently monitoring her BP. At last office visit for pharmacy appt (07/21/2019), pt's blood pressure appeared to be controlled. Pt is not experiencing any s/sx of hypotension. Plan to continue to monitor BP readings considering pt is experiencing increases in pain/stress from rheumatoid arthritis that may be affecting her blood pressure. Plan for patient to monitor BP readings at least 3 times per week and to continue amlodipine 2.5 mg daily and HCTZ 12.5 mg daily at this time.   Follow up via telephone 09/22/2019

## 2019-09-02 DIAGNOSIS — M0579 Rheumatoid arthritis with rheumatoid factor of multiple sites without organ or systems involvement: Secondary | ICD-10-CM | POA: Diagnosis not present

## 2019-09-02 DIAGNOSIS — M1A09X0 Idiopathic chronic gout, multiple sites, without tophus (tophi): Secondary | ICD-10-CM | POA: Diagnosis not present

## 2019-09-02 DIAGNOSIS — Z719 Counseling, unspecified: Secondary | ICD-10-CM | POA: Diagnosis not present

## 2019-09-02 DIAGNOSIS — M2550 Pain in unspecified joint: Secondary | ICD-10-CM | POA: Diagnosis not present

## 2019-09-11 ENCOUNTER — Telehealth: Payer: Self-pay | Admitting: Physical Medicine and Rehabilitation

## 2019-09-11 NOTE — Telephone Encounter (Signed)
Patient called asked what was the name of the  Injection and dosage that Dr Ernestina Patches gave her. Patient said anyother information concerning the injection is also needed by Dr. Maryjean Ka. . The contact person at Dr. Maryjean Ka is Jarrett Soho ph# [phone] Ext: [phone]. This information is needed today  for Dr. Clydell Hakim at [facility].

## 2019-09-11 NOTE — Telephone Encounter (Signed)
Please see message below.  Called pt and lvm #1 to let patient know that Dr. Ernestina Patches is out of the office on Friday afternoons and we will contact her on Monday (09/14/2019).

## 2019-09-14 DIAGNOSIS — M47812 Spondylosis without myelopathy or radiculopathy, cervical region: Secondary | ICD-10-CM | POA: Diagnosis not present

## 2019-09-14 NOTE — Telephone Encounter (Signed)
Please make sure my notes sent to [facility]. Medication was depo-medrol and 0.25%  Marcaine. We drew up 40mg  ie 0.5 cc of the DepoMedrol.

## 2019-09-14 NOTE — Telephone Encounter (Signed)
Faxed Notes to Dr. Maryjean Ka office at [phone]

## 2019-09-23 ENCOUNTER — Telehealth: Payer: Self-pay | Admitting: Pharmacist

## 2019-09-23 DIAGNOSIS — E782 Mixed hyperlipidemia: Secondary | ICD-10-CM

## 2019-09-23 NOTE — Telephone Encounter (Signed)
Called pt on 09/23/2019 at 2:20PM and left HIPAA-compliant VM with instructions to call [facility] clinic back   Plan to discuss if pt is willing to further titrate up statin dosing from rosuvastatin 10 mg daily. Will f/u again on 09/25/2019.  Drexel Iha, PharmD PGY2 Ambulatory Care Pharmacy Resident

## 2019-09-23 NOTE — Telephone Encounter (Signed)
Pt returned call to clinic and states she's tolerating rosuvastatin 10mg  daily well. She has been on this dose for about 3 weeks and states she just picked up a 90 day supply refill. She prefers to continue on the 10mg  dosing for a while longer as it has sometimes taken 1-2 months for previous myalgias to develop. Will plan to recheck lipid panel on 1/14 to determine if further dose increase is needed and follow up with pt at that time.

## 2019-09-23 NOTE — Addendum Note (Signed)
Addended by: Mardy Lucier E on: 09/23/2019 02:37 PM   Modules accepted: Orders

## 2019-10-06 DIAGNOSIS — M47812 Spondylosis without myelopathy or radiculopathy, cervical region: Secondary | ICD-10-CM | POA: Diagnosis not present

## 2019-10-06 DIAGNOSIS — Z6829 Body mass index (BMI) 29.0-29.9, adult: Secondary | ICD-10-CM | POA: Diagnosis not present

## 2019-10-06 DIAGNOSIS — I10 Essential (primary) hypertension: Secondary | ICD-10-CM | POA: Diagnosis not present

## 2019-11-05 ENCOUNTER — Other Ambulatory Visit: Payer: Self-pay

## 2019-11-05 ENCOUNTER — Other Ambulatory Visit: Payer: Medicare Other | Admitting: *Deleted

## 2019-11-05 ENCOUNTER — Encounter (INDEPENDENT_AMBULATORY_CARE_PROVIDER_SITE_OTHER): Payer: Self-pay

## 2019-11-05 DIAGNOSIS — E782 Mixed hyperlipidemia: Secondary | ICD-10-CM | POA: Diagnosis not present

## 2019-11-05 LAB — HEPATIC FUNCTION PANEL
ALT: 51 IU/L — ABNORMAL HIGH (ref 0–32)
AST: 43 IU/L — ABNORMAL HIGH (ref 0–40)
Albumin: 4.3 g/dL (ref 3.7–4.7)
Alkaline Phosphatase: 113 IU/L (ref 39–117)
Bilirubin Total: 0.3 mg/dL (ref 0.0–1.2)
Bilirubin, Direct: 0.13 mg/dL (ref 0.00–0.40)
Total Protein: 7 g/dL (ref 6.0–8.5)

## 2019-11-05 LAB — LIPID PANEL
Chol/HDL Ratio: 2.1 ratio (ref 0.0–4.4)
Cholesterol, Total: 103 mg/dL (ref 100–199)
HDL: 48 mg/dL (ref >39)
LDL Chol Calc (NIH): 35 mg/dL (ref 0–99)
Triglycerides: 106 mg/dL (ref 0–149)
VLDL Cholesterol Cal: 20 mg/dL (ref 5–40)

## 2019-11-05 IMAGING — US US RENAL
1 series · 14 of 25 positions shown · non-contrast
Comparison: None.

CLINICAL DATA: Acute kidney injury

EXAM:
RENAL / URINARY TRACT ULTRASOUND COMPLETE

[Series 1: us renal · 0.25mm/px · 14 of 37 slices shown]
[im 1/37]
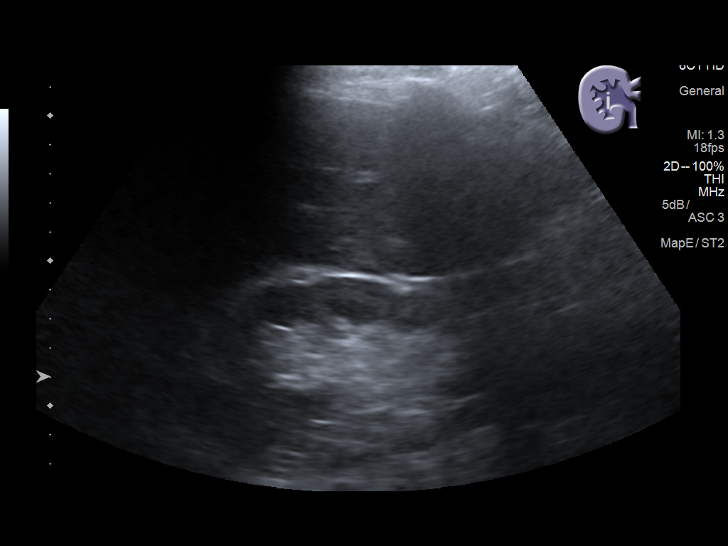
[im 4/37]
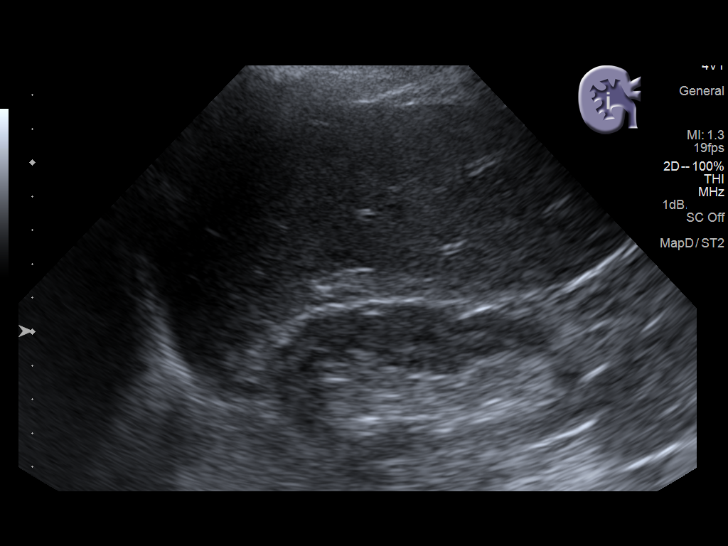
[im 7/37]
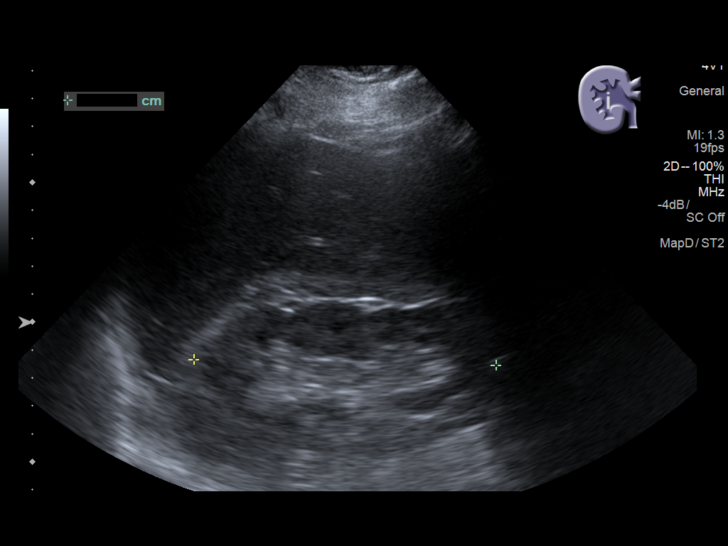
[im 10/37]
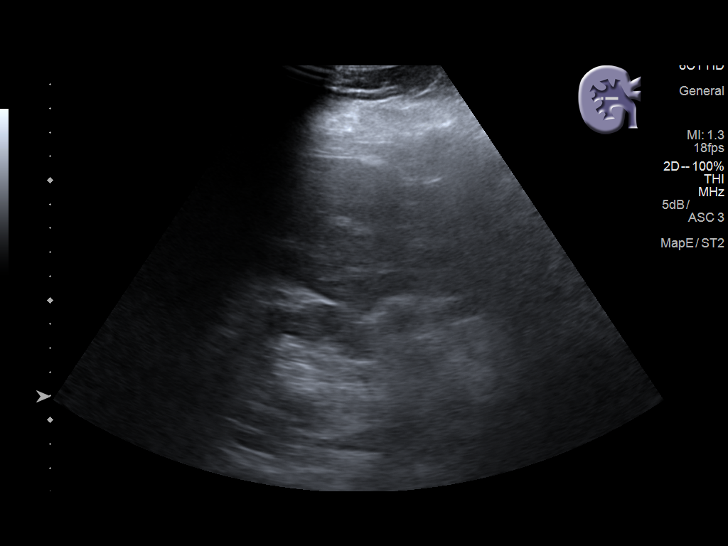
[im 13/37]
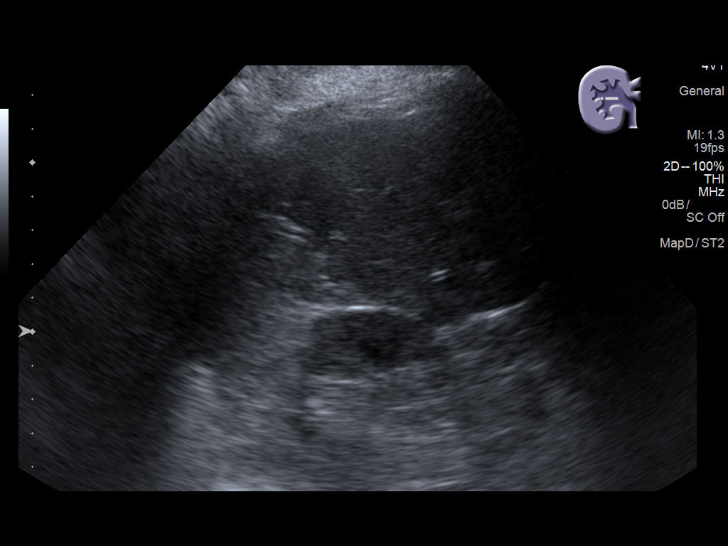
[im 14/37]
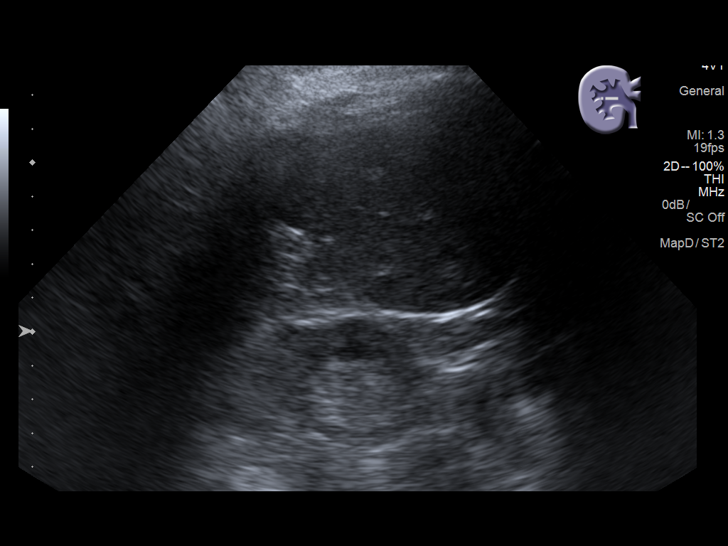
[im 17/37]
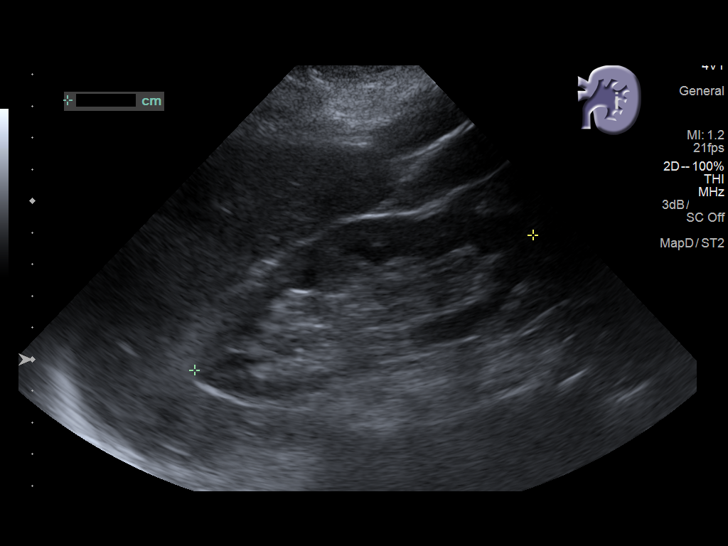
[im 20/37]
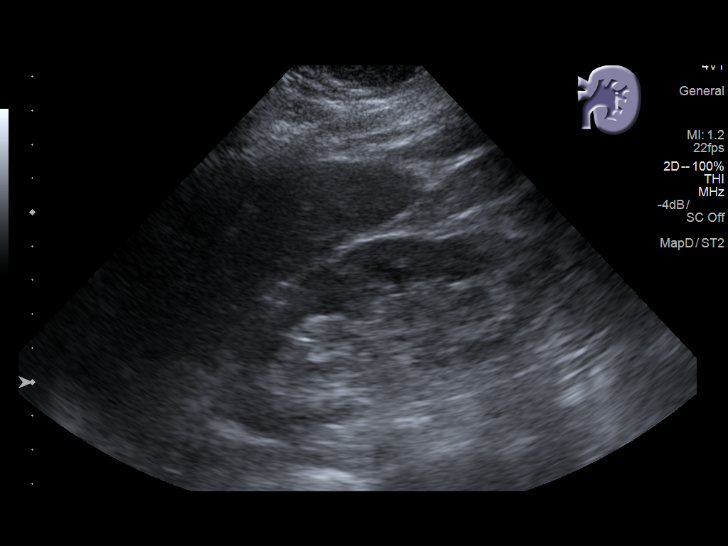
[im 23/37]
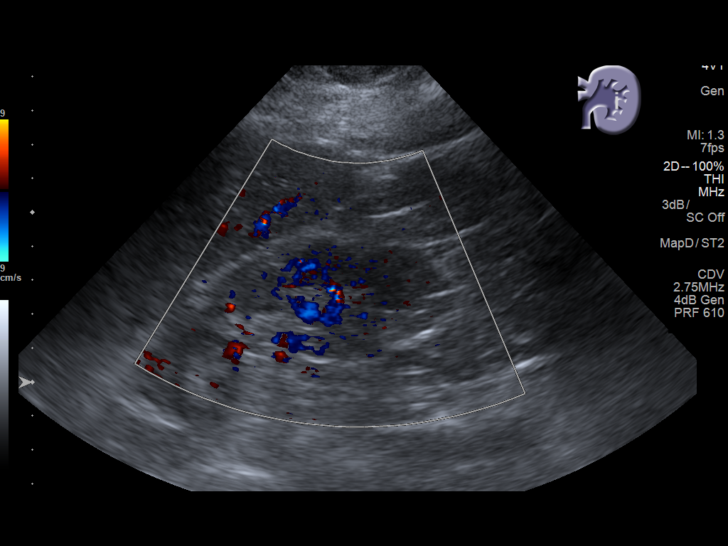
[im 25/37]
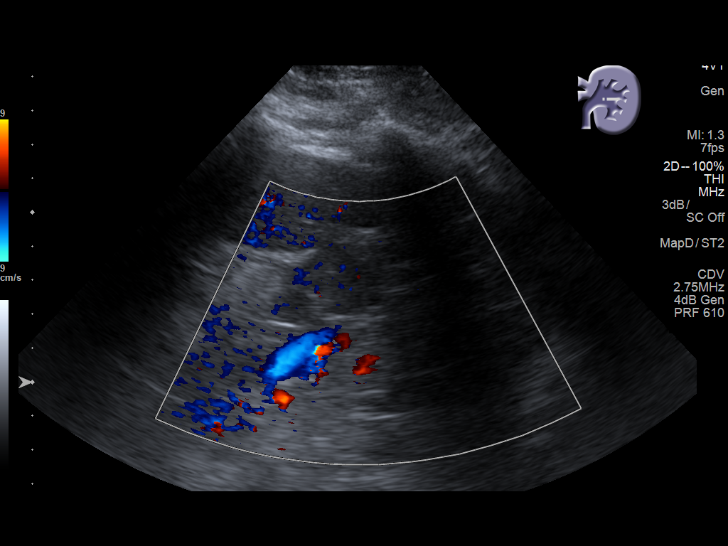
[im 28/37]
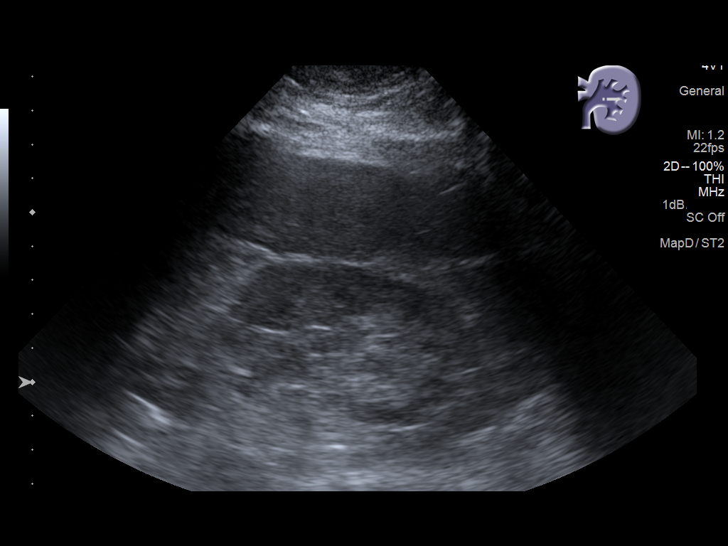
[im 31/37]
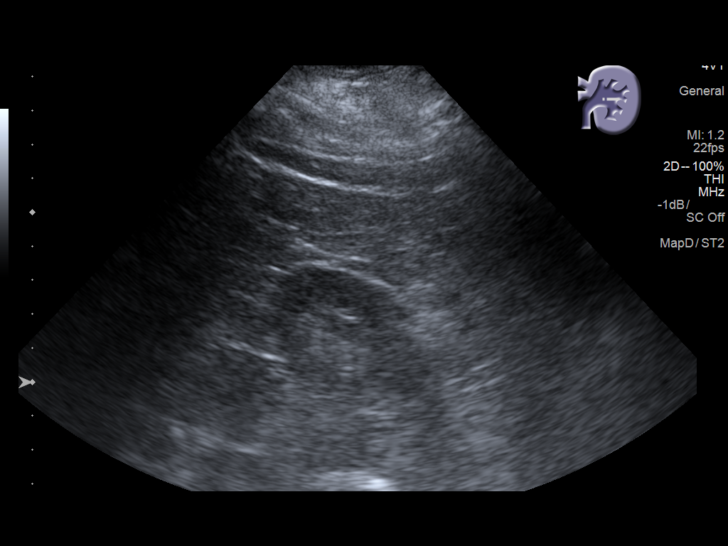
[im 34/37]
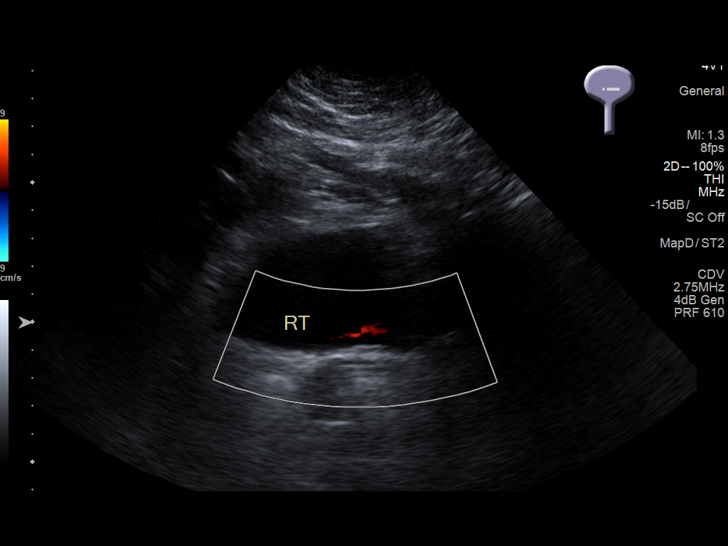
[im 37/37]
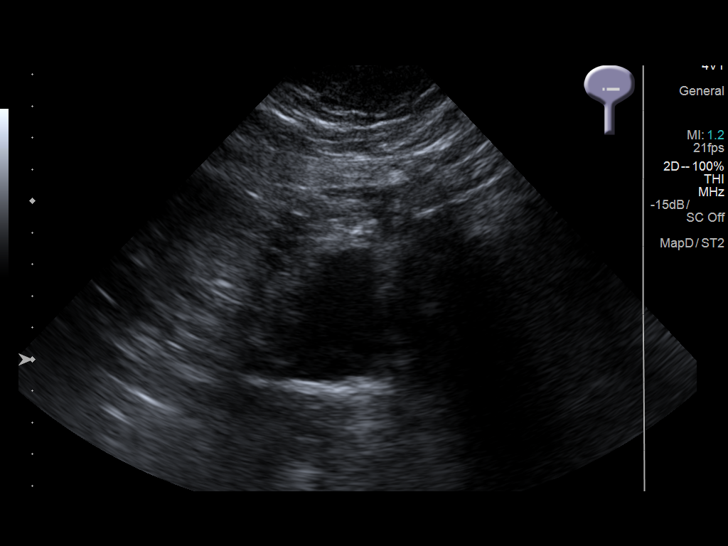

[14 of 25 positions shown; findings below may reference images not displayed]

FINDINGS: Right Kidney:

Length: 10.8 cm. Echogenicity within normal limits. No mass or
hydronephrosis visualized.

Left Kidney:

Length: 11.5 cm. Echogenicity within normal limits. No mass or
hydronephrosis visualized.

Bladder:

Appears normal for degree of bladder distention.
IMPRESSION: Negative renal ultrasound.

## 2019-11-06 ENCOUNTER — Other Ambulatory Visit: Payer: Self-pay

## 2019-11-06 DIAGNOSIS — E782 Mixed hyperlipidemia: Secondary | ICD-10-CM

## 2019-11-06 MED ORDER — AMLODIPINE BESYLATE 2.5 MG PO TABS: 2.5000 mg | ORAL_TABLET | Freq: Every day | ORAL | 3 refills | Status: DC

## 2019-11-06 MED ORDER — ROSUVASTATIN CALCIUM 10 MG PO TABS: 10.0000 mg | ORAL_TABLET | Freq: Every day | ORAL | 3 refills | Status: DC

## 2019-11-11 DIAGNOSIS — K1329 Other disturbances of oral epithelium, including tongue: Secondary | ICD-10-CM | POA: Diagnosis not present

## 2019-11-11 NOTE — Progress Notes (Signed)
Cardiology Office Note   Date:  11/12/2019   ID:  Madison Holden, DOB 1946/08/17, MRN [id number]  PCP:  Aura Dials, MD  Cardiologist:   Dorris Carnes, MD    F/U of HTN     History of Present Illness: Madison Holden is a 74 y.o. female with a history of HTN and DM    I saw the pt as a televsiti in April and May    At that time added amlodipine to regimen   I also thought she should add Crestor when seen in clinic   Mild plaquing on CT    I saw the pt in summer 2020   She was also seen by  Beckie Salts    Pt says she has been breathing oK   She denies CP   No dizziness SHe brings in BP readings   110s to 140s/      Current Meds  Medication Sig  . acetaminophen (TYLENOL) 650 MG CR tablet Take 650 mg by mouth every 8 (eight) hours as needed for pain.  Marland Kitchen allopurinol (ZYLOPRIM) 300 MG tablet Take 300 mg by mouth daily.  Marland Kitchen amLODipine (NORVASC) 2.5 MG tablet Take 1 tablet (2.5 mg total) by mouth daily.  . ASA-APAP-Caff Buffered (VANQUISH PO) Take by mouth. As needed  . B-D TB SYRINGE 1CC/27GX1/2" 27G X 1/2" 1 ML MISC USE WEEKLY WITH METHOTREXATE SUBCUTANEOUSLY 90 DAYS  . Baclofen 5 MG TABS Take 2.5 mg by mouth at bedtime as needed.  . chlorhexidine (PERIDEX) 0.12 % solution 1/2 OZ SWISH AND SPIT 2 TIMES PER DAY FOR 2 FULL MINUTES  . diazepam (VALIUM) 5 MG tablet Take 1 by mouth 1 hour  pre-procedure with very light food. May bring 2nd tablet to appointment.  . diphenhydrAMINE (BENADRYL) 25 MG tablet Take 25 mg by mouth at bedtime as needed for sleep.   Scarlette Shorts SURECLICK 50 MG/ML injection 25 mg as directed. Two times a week  . esomeprazole (NEXIUM) 20 MG capsule Take 20 mg by mouth 3 (three) times a week.  . Ferrous Gluconate 324 (37.5 Fe) MG TABS Take 1 tablet by mouth 2 (two) times daily.  . folic acid (FOLVITE) Q000111Q MCG tablet Take 400 mcg by mouth daily.  . hydrochlorothiazide (HYDRODIURIL) 25 MG tablet Take 12.5 mg by mouth daily.  Marland Kitchen ketoconazole (NIZORAL) 2 % cream Apply  1 application topically as needed.  . Magnesium 500 MG TABS Take 500 mg by mouth every other day.  . Melatonin 3 MG TABS Take 3-6 mg by mouth at bedtime as needed (sleep).   . methotrexate 250 MG/10ML injection once a week. 0.9  . oxybutynin (DITROPAN) 5 MG tablet Take 5 mg by mouth daily. May take a second 5 mg dose as needed for bladder spasms  . pioglitazone (ACTOS) 30 MG tablet Take 30 mg by mouth daily.   . rosuvastatin (CRESTOR) 10 MG tablet Take 20 mg by mouth daily.  . Turmeric (QC TUMERIC COMPLEX) 500 MG CAPS Take by mouth.  . vitamin B-12 (CYANOCOBALAMIN) 1000 MCG tablet Take 1,000 mcg by mouth daily.  . vitamin C (ASCORBIC ACID) 500 MG tablet Take 500 mg by mouth 4 (four) times a week.   Current Facility-Administered Medications for the 11/12/19 encounter (Office Visit) with Fay Records, MD  Medication  . methylPREDNISolone acetate (DEPO-MEDROL) injection 80 mg     Allergies:   Patient has no known allergies.   Past Medical History:  Diagnosis Date  .  Ambulates with cane    due to gout   . Arthritis    hands  . Chest pain, unspecified 07-01-2018  per pt had 2 day chest pain and sob  6 weeks ago,, per pt no symptoms since   cardiology-- dr Salley Scarlet--  scheduled for stress test and echo 07-03-2018 prior to surgery scheduled 07-08-2018  . Diarrhea, unspecified    intermittant  . DOE (dyspnea on exertion)   . Endometrial cancer ([place])   . Essential hypertension   . Frequency of urination   . GERD (gastroesophageal reflux disease)   . Gout rheumologist-- michelle young PA ( rheumatology)   07-01-2018 per pt flare up april 2019 , hands and wrists  . Heart murmur   . History of adenomatous polyp of colon   . History of esophageal dilatation    for stricture  . Mild nausea    per pt intermittant , no vomiting  . Mixed dyslipidemia   . Muscle weakness of extremity    bilateral upper and lower extremities due to gout  . Poor appetite    per pt   . Type 2  diabetes mellitus ([place])    followed by pcp  . Urgency of urination   . Urine frequency     Past Surgical History:  Procedure Laterality Date  . BREAST CYST EXCISION Right 1974   benign per pt  . CATARACT EXTRACTION W/ INTRAOCULAR LENS  IMPLANT, BILATERAL  2014;  2017  . MOUTH SURGERY  11/2017   Upper quadrants,  per pt peridontist did laser procedure on gums  . ROBOTIC ASSISTED TOTAL HYSTERECTOMY WITH BILATERAL SALPINGO OOPHERECTOMY Bilateral 07/22/2018   Procedure: XI ROBOTIC ASSISTED TOTAL HYSTERECTOMY WITH BILATERAL SALPINGO OOPHORECTOMY AND SENTINAL LYMPH NODE BIOPSY;  Surgeon: Everitt Amber, MD;  Location: [facility];  Service: Gynecology;  Laterality: Bilateral;  . TONSILLECTOMY  child     Social History:  The patient  reports that she has never smoked. She has never used smokeless tobacco. She reports previous alcohol use. She reports that she does not use drugs.   Family History:  The patient's family history includes Clotting disorder in her father; Diabetes in her maternal grandfather; Heart disease in her father and mother; Heart failure in her mother; Prostate cancer in her father.    ROS:  Please see the history of present illness. All other systems are reviewed and  Negative to the above problem except as noted.    PHYSICAL EXAM: VS:  BP (!) 142/82   Pulse 85   Ht 4\' 10"  (1.473 m)   Wt 202 lb 6.4 oz (91.8 kg)   SpO2 96%   BMI 42.30 kg/m    GEN: Morbdily obese 74 yo in no acute distress  HEENT: normal  Neck: JVP is not elevated   Cardiac: RRR; no murmurs, rubs, or gallops,no LE edema  Respiratory:  clear to auscultation bilaterally, normal work of breathing GI: soft, nontender, nondistended, + BS  MS: no deformity Moving all extremities   Skin: warm and dry, no rash Neuro:  Strength and sensation are intact Psych: euthymic mood, full affect   EKG:  EKG shows SR 80 bpm  NonspecificST changes    Lipid Panel    Component Value Date/Time   CHOL 103 11/05/2019  0806   TRIG 106 11/05/2019 0806   HDL 48 11/05/2019 0806   CHOLHDL 2.1 11/05/2019 0806   CHOLHDL 4 06/29/2010 0948   VLDL 41.0 (H) 06/29/2010 0948   LDLCALC 35 11/05/2019  [id number]   LDLDIRECT 56.6 06/29/2010 0948      Wt Readings from Last 3 Encounters:  11/12/19 202 lb 6.4 oz (91.8 kg)  06/23/19 193 lb 12.8 oz (87.9 kg)  06/22/19 192 lb 12.8 oz (87.5 kg)      ASSESSMENT AND PLAN:  1  HTN  BP is controlled.   Keep on same regimen   2  HL    LDL is excellent at 35  She is tolerating Crestor with no achiness  WIll back down to 10 mg   F/U lipids and repeat LFTs in 8 wks  3  CAD  Coronary calcificatiosn of CT  No symtoms to suggest angina  F/U in 9 months     Current medicines are reviewed at length with the patient today.  The patient does not have concerns regarding medicines.  Signed, Dorris Carnes, MD  11/12/2019 9:55 AM    [address], [place], [place]  [address] Phone: [phone]; Fax: [phone]

## 2019-11-12 ENCOUNTER — Other Ambulatory Visit: Payer: Self-pay

## 2019-11-12 ENCOUNTER — Ambulatory Visit: Payer: Medicare Other | Admitting: Internal Medicine

## 2019-11-12 ENCOUNTER — Encounter: Payer: Self-pay | Admitting: Internal Medicine

## 2019-11-12 VITALS — BP 142/82 | HR 85 | Ht <= 58 in | Wt 202.4 lb

## 2019-11-12 DIAGNOSIS — E782 Mixed hyperlipidemia: Secondary | ICD-10-CM

## 2019-11-12 DIAGNOSIS — I2510 Atherosclerotic heart disease of native coronary artery without angina pectoris: Secondary | ICD-10-CM

## 2019-11-12 MED ORDER — ROSUVASTATIN CALCIUM 10 MG PO TABS: 10.0000 mg | ORAL_TABLET | Freq: Every day | ORAL | 3 refills | Status: DC

## 2019-11-12 NOTE — Patient Instructions (Signed)
Medication Instructions:  Your physician has recommended you make the following change in your medication:  1.) decrease rosuvastatin 10 mg (Crestor) to daily  *If you need a refill on your cardiac medications before your next appointment, please call your pharmacy*  Lab Work: In about 8 weeks --fasting lipids and liver panel  If you have labs (blood work) drawn today and your tests are completely normal, you will receive your results only by: Marland Kitchen MyChart Message (if you have MyChart) OR . A paper copy in the mail If you have any lab test that is abnormal or we need to change your treatment, we will call you to review the results.  Testing/Procedures: none  Follow-Up: At [facility], you and your health needs are our priority.  As part of our continuing mission to provide you with exceptional heart care, we have created designated Provider Care Teams.  These Care Teams include your primary Cardiologist (physician) and Advanced Practice Providers (APPs -  Physician Assistants and Nurse Practitioners) who all work together to provide you with the care you need, when you need it.  Your next appointment:   9 month(s)  The format for your next appointment:   Either In Person or Virtual  Provider:   You may see Dr. Dorris Carnes or one of the following Advanced Practice Providers on your designated Care Team:    Richardson Dopp, PA-C  [place], [place]  Daune Perch, NP   Other Instructions

## 2019-11-19 DIAGNOSIS — M47812 Spondylosis without myelopathy or radiculopathy, cervical region: Secondary | ICD-10-CM | POA: Diagnosis not present

## 2019-11-25 DIAGNOSIS — K1329 Other disturbances of oral epithelium, including tongue: Secondary | ICD-10-CM | POA: Diagnosis not present

## 2019-11-25 DIAGNOSIS — K1370 Unspecified lesions of oral mucosa: Secondary | ICD-10-CM | POA: Diagnosis not present

## 2019-12-03 DIAGNOSIS — I10 Essential (primary) hypertension: Secondary | ICD-10-CM | POA: Diagnosis not present

## 2019-12-03 DIAGNOSIS — M47812 Spondylosis without myelopathy or radiculopathy, cervical region: Secondary | ICD-10-CM | POA: Diagnosis not present

## 2019-12-03 DIAGNOSIS — Z6829 Body mass index (BMI) 29.0-29.9, adult: Secondary | ICD-10-CM | POA: Diagnosis not present

## 2019-12-16 DIAGNOSIS — Z992 Dependence on renal dialysis: Secondary | ICD-10-CM | POA: Diagnosis not present

## 2019-12-16 DIAGNOSIS — Z719 Counseling, unspecified: Secondary | ICD-10-CM | POA: Diagnosis not present

## 2019-12-16 DIAGNOSIS — M0579 Rheumatoid arthritis with rheumatoid factor of multiple sites without organ or systems involvement: Secondary | ICD-10-CM | POA: Diagnosis not present

## 2019-12-16 DIAGNOSIS — Z6841 Body Mass Index (BMI) 40.0 and over, adult: Secondary | ICD-10-CM | POA: Diagnosis not present

## 2019-12-16 DIAGNOSIS — M1A09X0 Idiopathic chronic gout, multiple sites, without tophus (tophi): Secondary | ICD-10-CM | POA: Diagnosis not present

## 2019-12-16 DIAGNOSIS — D509 Iron deficiency anemia, unspecified: Secondary | ICD-10-CM | POA: Diagnosis not present

## 2019-12-16 DIAGNOSIS — N2581 Secondary hyperparathyroidism of renal origin: Secondary | ICD-10-CM | POA: Diagnosis not present

## 2020-01-07 ENCOUNTER — Other Ambulatory Visit: Payer: Medicare Other | Admitting: *Deleted

## 2020-01-07 ENCOUNTER — Other Ambulatory Visit: Payer: Self-pay

## 2020-01-07 DIAGNOSIS — E782 Mixed hyperlipidemia: Secondary | ICD-10-CM

## 2020-01-07 DIAGNOSIS — I2510 Atherosclerotic heart disease of native coronary artery without angina pectoris: Secondary | ICD-10-CM | POA: Diagnosis not present

## 2020-01-07 LAB — LIPID PANEL
Chol/HDL Ratio: 2.8 ratio (ref 0.0–4.4)
Cholesterol, Total: 110 mg/dL (ref 100–199)
HDL: 39 mg/dL — ABNORMAL LOW (ref >39)
LDL Chol Calc (NIH): 48 mg/dL (ref 0–99)
Triglycerides: 129 mg/dL (ref 0–149)
VLDL Cholesterol Cal: 23 mg/dL (ref 5–40)

## 2020-01-07 LAB — HEPATIC FUNCTION PANEL
ALT: 54 IU/L — ABNORMAL HIGH (ref 0–32)
AST: 56 IU/L — ABNORMAL HIGH (ref 0–40)
Albumin: 4 g/dL (ref 3.7–4.7)
Alkaline Phosphatase: 114 IU/L (ref 39–117)
Bilirubin Total: 0.6 mg/dL (ref 0.0–1.2)
Bilirubin, Direct: 0.18 mg/dL (ref 0.00–0.40)
Total Protein: 6.7 g/dL (ref 6.0–8.5)

## 2020-01-13 ENCOUNTER — Telehealth: Payer: Self-pay | Admitting: *Deleted

## 2020-01-13 DIAGNOSIS — I10 Essential (primary) hypertension: Secondary | ICD-10-CM | POA: Diagnosis not present

## 2020-01-13 DIAGNOSIS — E11319 Type 2 diabetes mellitus with unspecified diabetic retinopathy without macular edema: Secondary | ICD-10-CM | POA: Diagnosis not present

## 2020-01-13 DIAGNOSIS — M069 Rheumatoid arthritis, unspecified: Secondary | ICD-10-CM | POA: Diagnosis not present

## 2020-01-13 DIAGNOSIS — D509 Iron deficiency anemia, unspecified: Secondary | ICD-10-CM | POA: Diagnosis not present

## 2020-01-13 DIAGNOSIS — E782 Mixed hyperlipidemia: Secondary | ICD-10-CM

## 2020-01-13 NOTE — Telephone Encounter (Signed)
-----   Message from Dorris Carnes V, MD sent at 01/08/2020  4:25 PM EDT ----- Lipids are excellent   LDL 48 Liver enzymes are minimally elevated   May reflect immune meds she is on BUt I would cut back on Crestor to 5 mg  Follow up LFTs in 12 wks  and lipids

## 2020-01-13 NOTE — Telephone Encounter (Signed)
I placed call to patient and left message to call office 

## 2020-01-14 DIAGNOSIS — D485 Neoplasm of uncertain behavior of skin: Secondary | ICD-10-CM | POA: Diagnosis not present

## 2020-01-14 DIAGNOSIS — C44329 Squamous cell carcinoma of skin of other parts of face: Secondary | ICD-10-CM | POA: Diagnosis not present

## 2020-01-14 DIAGNOSIS — L821 Other seborrheic keratosis: Secondary | ICD-10-CM | POA: Diagnosis not present

## 2020-01-14 MED ORDER — ROSUVASTATIN CALCIUM 5 MG PO TABS: 5.0000 mg | ORAL_TABLET | Freq: Every day | ORAL | 3 refills | Status: DC

## 2020-01-14 NOTE — Telephone Encounter (Signed)
Madison Holden is returning Madison Holden's call in regards to her lab results. She states if she is unable to answer when calling back please leave a detailed message.

## 2020-01-14 NOTE — Telephone Encounter (Signed)
Reviewed lab results with patient. She will decrease Crestor to 5 mg daily and return at the end of June to repeat lipids/liver function. Appointment scheduled.

## 2020-01-19 DIAGNOSIS — M47812 Spondylosis without myelopathy or radiculopathy, cervical region: Secondary | ICD-10-CM | POA: Diagnosis not present

## 2020-02-02 DIAGNOSIS — M47812 Spondylosis without myelopathy or radiculopathy, cervical region: Secondary | ICD-10-CM | POA: Diagnosis not present

## 2020-02-02 DIAGNOSIS — I10 Essential (primary) hypertension: Secondary | ICD-10-CM | POA: Diagnosis not present

## 2020-02-02 DIAGNOSIS — Z6829 Body mass index (BMI) 29.0-29.9, adult: Secondary | ICD-10-CM | POA: Diagnosis not present

## 2020-02-04 DIAGNOSIS — C44329 Squamous cell carcinoma of skin of other parts of face: Secondary | ICD-10-CM | POA: Diagnosis not present

## 2020-02-16 ENCOUNTER — Telehealth: Payer: Self-pay | Admitting: *Deleted

## 2020-02-16 NOTE — Telephone Encounter (Signed)
Called to move the patient's appt from the afternoon to the morning

## 2020-02-16 NOTE — Progress Notes (Signed)
Gynecologic Oncology Follow-up Note  Chief Complaint:  Chief Complaint  Patient presents with  . 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  Plan: Pathology revealed low risk factors for recurrence, therefore no adjuvant therapy is recommended according to NCCN guidelines.  I discussed risk for recurrence and typical symptoms encouraged her to notify us of these should they develop between visits.  I recommend she have follow-up every 6 months for 5 years in accordance with NCCN guidelines. Those visits should include symptom assessment, physical exam and pelvic examination. Pap smears are not indicated or recommended in the routine surveillance of endometrial cancer.  She will follow up with me in 12 months and in 6 months with Dr Philis Pique.   HPI: 74 year old white married female seen in consultation at the request of Dr. Philis Pique regarding management of a newly diagnosed endometrial cancer.  Patient had a routine Pap smear that showed atypical glandular cells.  Cervix was stenotic requiring the patient undergo a D&C which revealed a polypoid lesion in the endometrial cavity.  Final pathology showed this to be a well-differentiated endometrial carcinoma.  Uterus is normal size (sounded 7 cm).  Patient denies any postmenopausal bleeding or discharge.  She has no pelvic pain pressure and has no GI or GU symptoms.  She denies any past gynecologic history.  She has a number of medical problems listed above.  Most recently she has had a bout of gout causing fair amount of pain in her arms hands and legs.  At her preop visit she was noted to have hyperkalemia (7.2) and AKI. She was admitted and determined to have acute on chronic kidney injury from NSAID use in the setting of a gout flare. Supportive therapy was instituted and she recovered normalizing her labs (normal potassium at recheck on 05/15/18).    On 10.1.19 she underwent robotic assisted total hysterectomy, BSO, and bilateral SLN biopsy. Findings showed a grade 1 cancer with no myo invasion, no LVSI, negative cervical or nodal involvmement. She was staged as stage IA grade 1 endometrioid adenocarcinoma. MSI testing was stable/MMR normal. She was determined to have low risk disease and therefore no adjuvant therapy was prescribed in accordance with NCCN guidelines.   Interval Hx: Has been diagnosed with rheumatoid arthritis. On methotrexate for this and enbril.  She is having shoulder and neck pains for which she is being seen by a neurologist for shots.  She is planning on traveling to [place] in the fall for a family wedding.     Review of Systems:10 point review of systems is negative except as noted in interval history.   Vitals: Blood pressure 134/66, pulse 81, temperature 98.2 F (36.8 C), temperature source Temporal, resp. rate 18, height '4\' 10"'$  (1.473 m), weight 198 lb (89.8 kg), SpO2 98 %.  Physical Exam: Deferred due to telephone visit.     No Known Allergies  Past Medical History:  Diagnosis Date  . Ambulates with cane    due to gout   . Arthritis    hands  . Chest pain, unspecified 07-01-2018  per pt had 2 day chest pain and sob  6 weeks ago,, per pt no symptoms since   cardiology-- dr Salley Scarlet--  scheduled for stress test and echo 07-03-2018 prior to surgery scheduled 07-08-2018  . Diarrhea, unspecified    intermittant  . DOE (dyspnea on exertion)   . Endometrial cancer ([place])   .  Essential hypertension   . Frequency of urination   . GERD (gastroesophageal reflux disease)   . Gout rheumologist-- michelle young PA ([facility])   07-01-2018 per pt flare up april 2019 , hands and wrists  . Heart murmur   . History of adenomatous polyp of colon   . History of esophageal dilatation    for stricture  . Mild nausea    per pt intermittant , no vomiting  . Mixed dyslipidemia   . Muscle weakness  of extremity    bilateral upper and lower extremities due to gout  . Poor appetite    per pt   . Type 2 diabetes mellitus ([place])    followed by pcp  . Urgency of urination   . Urine frequency     Past Surgical History:  Procedure Laterality Date  . BREAST CYST EXCISION Right 1974   benign per pt  . CATARACT EXTRACTION W/ INTRAOCULAR LENS  IMPLANT, BILATERAL  2014;  2017  . MOUTH SURGERY  11/2017   Upper quadrants,  per pt peridontist did laser procedure on gums  . ROBOTIC ASSISTED TOTAL HYSTERECTOMY WITH BILATERAL SALPINGO OOPHERECTOMY Bilateral 07/22/2018   Procedure: XI ROBOTIC ASSISTED TOTAL HYSTERECTOMY WITH BILATERAL SALPINGO OOPHORECTOMY AND SENTINAL LYMPH NODE BIOPSY;  Surgeon: Everitt Amber, MD;  Location: [facility];  Service: Gynecology;  Laterality: Bilateral;  . TONSILLECTOMY  child    Current Outpatient Medications  Medication Sig Dispense Refill  . acetaminophen (TYLENOL) 650 MG CR tablet Take 650 mg by mouth every 8 (eight) hours as needed for pain.    Marland Kitchen allopurinol (ZYLOPRIM) 300 MG tablet Take 300 mg by mouth daily.    Marland Kitchen amLODipine (NORVASC) 2.5 MG tablet Take 1 tablet (2.5 mg total) by mouth daily. 90 tablet 3  . ASA-APAP-Caff Buffered (VANQUISH PO) Take by mouth. As needed    . ASA-APAP-Caff Buffered (VANQUISH PO) Vanquish    . B-D TB SYRINGE 1CC/27GX1/2" 27G X 1/2" 1 ML MISC USE WEEKLY WITH METHOTREXATE SUBCUTANEOUSLY 90 DAYS    . diphenhydrAMINE (BENADRYL) 25 MG tablet Take 25 mg by mouth at bedtime as needed for sleep.     Scarlette Shorts SURECLICK 50 MG/ML injection 25 mg as directed. Two times a week    . esomeprazole (NEXIUM) 20 MG capsule Take 20 mg by mouth 3 (three) times a week.    . Ferrous Gluconate 324 (37.5 Fe) MG TABS Take 1 tablet by mouth 2 (two) times daily.    . folic acid (FOLVITE) 962 MCG tablet Take 400 mcg by mouth daily.    . hydrochlorothiazide (HYDRODIURIL) 25 MG tablet Take 12.5 mg by mouth daily.    Marland Kitchen ketoconazole (NIZORAL) 2 % cream Apply 1  application topically as needed.    . Magnesium 500 MG TABS Take 500 mg by mouth every other day.    . Melatonin 3 MG TABS Take 3-6 mg by mouth at bedtime as needed (sleep).     . methotrexate 250 MG/10ML injection once a week. 0.9    . oxybutynin (DITROPAN) 5 MG tablet Take 5 mg by mouth daily. May take a second 5 mg dose as needed for bladder spasms    . pioglitazone (ACTOS) 30 MG tablet Take 30 mg by mouth daily.     . rosuvastatin (CRESTOR) 5 MG tablet Take 1 tablet (5 mg total) by mouth daily. 90 tablet 3  . Turmeric (QC TUMERIC COMPLEX) 500 MG CAPS Take by mouth.    . vitamin B-12 (CYANOCOBALAMIN)  1000 MCG tablet Take 1,000 mcg by mouth daily.    . vitamin C (ASCORBIC ACID) 500 MG tablet Take 500 mg by mouth 4 (four) times a week.     Current Facility-Administered Medications  Medication Dose Route Frequency Provider Last Rate Last Admin  . methylPREDNISolone acetate (DEPO-MEDROL) injection 80 mg  80 mg Other Once Magnus Sinning, MD        Social History   Socioeconomic History  . Marital status: Married    Spouse name: Not on file  . Number of children: 0  . Years of education: Not on file  . Highest education level: Not on file  Occupational History  . Occupation: retired  Tobacco Use  . Smoking status: Never Smoker  . Smokeless tobacco: Never Used  Substance and Sexual Activity  . Alcohol use: Not Currently    Comment: 2-3 per  week, not currently drinking since Apri; 2019  . Drug use: No  . Sexual activity: Not on file  Other Topics Concern  . Not on file  Social History Narrative   Has never smoked. Daily Caffeine Use- 2 cups per day. Does not get regular exercise. Retired.    Social Determinants of Health   Financial Resource Strain:   . Difficulty of Paying Living Expenses:   Food Insecurity:   . Worried About Charity fundraiser in the Last Year:   . Arboriculturist in the Last Year:   Transportation Needs:   . Film/video editor (Medical):   Marland Kitchen Lack  of Transportation (Non-Medical):   Physical Activity:   . Days of Exercise per Week:   . Minutes of Exercise per Session:   Stress:   . Feeling of Stress :   Social Connections:   . Frequency of Communication with Friends and Family:   . Frequency of Social Gatherings with Friends and Family:   . Attends Religious Services:   . Active Member of Clubs or Organizations:   . Attends Archivist Meetings:   Marland Kitchen Marital Status:   Intimate Partner Violence:   . Fear of Current or Ex-Partner:   . Emotionally Abused:   Marland Kitchen Physically Abused:   . Sexually Abused:     Family History  Problem Relation Age of Onset  . Heart disease Mother   . Heart failure Mother   . Heart disease Father   . Prostate cancer Father   . Clotting disorder Father   . Diabetes Maternal Grandfather   . Colon cancer Neg Hx    Physical Exam: WD in NAD Neck  Supple NROM, without any enlargements.  Lymph Node Survey No cervical supraclavicular or inguinal adenopathy Cardiovascular  Pulse normal rate, regularity and rhythm. S1 and S2 normal.  Lungs  Clear to auscultation bilateraly, without wheezes/crackles/rhonchi. Good air movement.  Skin  No rash/lesions/breakdown  Psychiatry  Alert and oriented to person, place, and time  Abdomen  Normoactive bowel sounds, abdomen soft, non-tender and obese without evidence of hernia. Candida in folds of abdominal pannus Back No CVA tenderness Genito Urinary  Vulva/vagina: Normal external female genitalia.   No lesions. No discharge or bleeding.  Bladder/urethra:  No lesions or masses, well supported bladder  Vagina: smooth, atrophic, moderate relaxation, no lesions or masses or blood.  Adnexa: no palpable masses. Rectal  deferred Extremities  No bilateral cyanosis, clubbing or edema.   Thereasa Solo, MD 02/17/2020, 12:10 PM

## 2020-02-17 ENCOUNTER — Encounter: Payer: Self-pay | Admitting: Gynecologic Oncology

## 2020-02-17 ENCOUNTER — Other Ambulatory Visit: Payer: Self-pay

## 2020-02-17 ENCOUNTER — Inpatient Hospital Stay: Payer: Medicare Other | Attending: Gynecologic Oncology | Admitting: Gynecologic Oncology

## 2020-02-17 VITALS — BP 134/66 | HR 81 | Temp 98.2°F | Resp 18 | Ht <= 58 in | Wt 198.0 lb

## 2020-02-17 DIAGNOSIS — M069 Rheumatoid arthritis, unspecified: Secondary | ICD-10-CM | POA: Diagnosis not present

## 2020-02-17 DIAGNOSIS — C541 Malignant neoplasm of endometrium: Secondary | ICD-10-CM | POA: Insufficient documentation

## 2020-02-17 DIAGNOSIS — M25519 Pain in unspecified shoulder: Secondary | ICD-10-CM | POA: Diagnosis not present

## 2020-02-17 DIAGNOSIS — M542 Cervicalgia: Secondary | ICD-10-CM | POA: Insufficient documentation

## 2020-02-17 DIAGNOSIS — Z79899 Other long term (current) drug therapy: Secondary | ICD-10-CM | POA: Diagnosis not present

## 2020-02-17 DIAGNOSIS — Z90722 Acquired absence of ovaries, bilateral: Secondary | ICD-10-CM

## 2020-02-17 DIAGNOSIS — Z90710 Acquired absence of both cervix and uterus: Secondary | ICD-10-CM | POA: Insufficient documentation

## 2020-02-17 NOTE — Patient Instructions (Signed)
Please notify Dr Denman George at phone number [phone] if you notice vaginal bleeding, new pelvic or abdominal pains, bloating, feeling full easy, or a change in bladder or bowel function.   Please contact Dr Serita Grit office (at [phone]) in January, 2022 to request an appointment with her for April, 2022.  Please see Dr Philis Pique in 6 months from now for a gynecologic check and well-woman visit.

## 2020-02-18 DIAGNOSIS — K7689 Other specified diseases of liver: Secondary | ICD-10-CM | POA: Diagnosis not present

## 2020-03-03 DIAGNOSIS — L905 Scar conditions and fibrosis of skin: Secondary | ICD-10-CM | POA: Diagnosis not present

## 2020-03-10 DIAGNOSIS — M0579 Rheumatoid arthritis with rheumatoid factor of multiple sites without organ or systems involvement: Secondary | ICD-10-CM | POA: Diagnosis not present

## 2020-03-10 DIAGNOSIS — M1A09X0 Idiopathic chronic gout, multiple sites, without tophus (tophi): Secondary | ICD-10-CM | POA: Diagnosis not present

## 2020-03-10 DIAGNOSIS — Z6841 Body Mass Index (BMI) 40.0 and over, adult: Secondary | ICD-10-CM | POA: Diagnosis not present

## 2020-03-10 DIAGNOSIS — M2550 Pain in unspecified joint: Secondary | ICD-10-CM | POA: Diagnosis not present

## 2020-03-24 DIAGNOSIS — K1329 Other disturbances of oral epithelium, including tongue: Secondary | ICD-10-CM | POA: Diagnosis not present

## 2020-03-25 DIAGNOSIS — K121 Other forms of stomatitis: Secondary | ICD-10-CM | POA: Diagnosis not present

## 2020-03-28 DIAGNOSIS — M47812 Spondylosis without myelopathy or radiculopathy, cervical region: Secondary | ICD-10-CM | POA: Diagnosis not present

## 2020-03-30 DIAGNOSIS — S5011XA Contusion of right forearm, initial encounter: Secondary | ICD-10-CM | POA: Diagnosis not present

## 2020-03-30 DIAGNOSIS — B2790 Infectious mononucleosis, unspecified without complication: Secondary | ICD-10-CM | POA: Diagnosis not present

## 2020-04-01 ENCOUNTER — Encounter (INDEPENDENT_AMBULATORY_CARE_PROVIDER_SITE_OTHER): Payer: Medicare Other | Admitting: Ophthalmology

## 2020-04-01 ENCOUNTER — Other Ambulatory Visit: Payer: Self-pay

## 2020-04-01 DIAGNOSIS — I10 Essential (primary) hypertension: Secondary | ICD-10-CM | POA: Diagnosis not present

## 2020-04-01 DIAGNOSIS — E11319 Type 2 diabetes mellitus with unspecified diabetic retinopathy without macular edema: Secondary | ICD-10-CM | POA: Diagnosis not present

## 2020-04-01 DIAGNOSIS — E113293 Type 2 diabetes mellitus with mild nonproliferative diabetic retinopathy without macular edema, bilateral: Secondary | ICD-10-CM | POA: Diagnosis not present

## 2020-04-01 DIAGNOSIS — H35033 Hypertensive retinopathy, bilateral: Secondary | ICD-10-CM | POA: Diagnosis not present

## 2020-04-01 DIAGNOSIS — D3132 Benign neoplasm of left choroid: Secondary | ICD-10-CM

## 2020-04-01 DIAGNOSIS — H43813 Vitreous degeneration, bilateral: Secondary | ICD-10-CM

## 2020-04-05 DIAGNOSIS — L820 Inflamed seborrheic keratosis: Secondary | ICD-10-CM | POA: Diagnosis not present

## 2020-04-06 DIAGNOSIS — Z961 Presence of intraocular lens: Secondary | ICD-10-CM | POA: Diagnosis not present

## 2020-04-06 DIAGNOSIS — E113293 Type 2 diabetes mellitus with mild nonproliferative diabetic retinopathy without macular edema, bilateral: Secondary | ICD-10-CM | POA: Diagnosis not present

## 2020-04-06 DIAGNOSIS — H35033 Hypertensive retinopathy, bilateral: Secondary | ICD-10-CM | POA: Diagnosis not present

## 2020-04-06 DIAGNOSIS — H35013 Changes in retinal vascular appearance, bilateral: Secondary | ICD-10-CM | POA: Diagnosis not present

## 2020-04-13 DIAGNOSIS — M47812 Spondylosis without myelopathy or radiculopathy, cervical region: Secondary | ICD-10-CM | POA: Diagnosis not present

## 2020-04-13 DIAGNOSIS — E782 Mixed hyperlipidemia: Secondary | ICD-10-CM | POA: Diagnosis not present

## 2020-04-13 DIAGNOSIS — E11319 Type 2 diabetes mellitus with unspecified diabetic retinopathy without macular edema: Secondary | ICD-10-CM | POA: Diagnosis not present

## 2020-04-13 DIAGNOSIS — M069 Rheumatoid arthritis, unspecified: Secondary | ICD-10-CM | POA: Diagnosis not present

## 2020-04-13 DIAGNOSIS — I10 Essential (primary) hypertension: Secondary | ICD-10-CM | POA: Diagnosis not present

## 2020-04-15 ENCOUNTER — Other Ambulatory Visit: Payer: Medicare Other

## 2020-04-18 ENCOUNTER — Other Ambulatory Visit: Payer: Self-pay

## 2020-04-18 ENCOUNTER — Other Ambulatory Visit: Payer: Medicare Other | Admitting: *Deleted

## 2020-04-18 DIAGNOSIS — E782 Mixed hyperlipidemia: Secondary | ICD-10-CM

## 2020-04-18 LAB — HEPATIC FUNCTION PANEL
ALT: 45 IU/L — ABNORMAL HIGH (ref 0–32)
AST: 60 IU/L — ABNORMAL HIGH (ref 0–40)
Albumin: 4.3 g/dL (ref 3.7–4.7)
Alkaline Phosphatase: 103 IU/L (ref 48–121)
Bilirubin Total: 0.5 mg/dL (ref 0.0–1.2)
Bilirubin, Direct: 0.15 mg/dL (ref 0.00–0.40)
Total Protein: 7.1 g/dL (ref 6.0–8.5)

## 2020-04-18 LAB — LIPID PANEL
Chol/HDL Ratio: 2.7 ratio (ref 0.0–4.4)
Cholesterol, Total: 125 mg/dL (ref 100–199)
HDL: 46 mg/dL (ref >39)
LDL Chol Calc (NIH): 48 mg/dL (ref 0–99)
Triglycerides: 191 mg/dL — ABNORMAL HIGH (ref 0–149)
VLDL Cholesterol Cal: 31 mg/dL (ref 5–40)

## 2020-04-22 ENCOUNTER — Telehealth: Payer: Self-pay | Admitting: Internal Medicine

## 2020-04-22 DIAGNOSIS — R748 Abnormal levels of other serum enzymes: Secondary | ICD-10-CM

## 2020-04-22 NOTE — Telephone Encounter (Signed)
Rodman Key, RN  04/21/2020 6:19 PM EDT Back to Top    Left message for patient to call office for results.    Fay Records, MD  04/19/2020 4:22 PM EDT     LDL remains excellent   Triglycerides a little high  Prob reflect glucose control LFTs are minimally elevated.  Again, not clearly due to Crestor BUT would stop Crestor F/U liver panel in 3 months  May be related to Enbrel

## 2020-04-22 NOTE — Telephone Encounter (Signed)
The patient has been notified of the result and recommendations. Patient verbalized understanding. LFTS scheduled for 10/6.  All questions (if any) were answered. Cleon Gustin, RN 04/22/2020 1:37 PM

## 2020-04-22 NOTE — Telephone Encounter (Signed)
  Patient returning call from nurse regarding her results

## 2020-04-22 NOTE — Telephone Encounter (Signed)
Left message to call back  

## 2020-05-18 DIAGNOSIS — M2550 Pain in unspecified joint: Secondary | ICD-10-CM | POA: Diagnosis not present

## 2020-05-18 DIAGNOSIS — M0579 Rheumatoid arthritis with rheumatoid factor of multiple sites without organ or systems involvement: Secondary | ICD-10-CM | POA: Diagnosis not present

## 2020-05-18 DIAGNOSIS — M1A09X0 Idiopathic chronic gout, multiple sites, without tophus (tophi): Secondary | ICD-10-CM | POA: Diagnosis not present

## 2020-05-18 DIAGNOSIS — R5382 Chronic fatigue, unspecified: Secondary | ICD-10-CM | POA: Diagnosis not present

## 2020-06-13 DIAGNOSIS — D692 Other nonthrombocytopenic purpura: Secondary | ICD-10-CM | POA: Diagnosis not present

## 2020-06-13 DIAGNOSIS — L820 Inflamed seborrheic keratosis: Secondary | ICD-10-CM | POA: Diagnosis not present

## 2020-06-28 DIAGNOSIS — Z03818 Encounter for observation for suspected exposure to other biological agents ruled out: Secondary | ICD-10-CM | POA: Diagnosis not present

## 2020-06-28 DIAGNOSIS — Z1152 Encounter for screening for COVID-19: Secondary | ICD-10-CM | POA: Diagnosis not present

## 2020-07-21 DIAGNOSIS — Z1231 Encounter for screening mammogram for malignant neoplasm of breast: Secondary | ICD-10-CM | POA: Diagnosis not present

## 2020-07-21 DIAGNOSIS — Z01419 Encounter for gynecological examination (general) (routine) without abnormal findings: Secondary | ICD-10-CM | POA: Diagnosis not present

## 2020-07-27 ENCOUNTER — Other Ambulatory Visit: Payer: Medicare Other

## 2020-08-02 DIAGNOSIS — S92516A Nondisplaced fracture of proximal phalanx of unspecified lesser toe(s), initial encounter for closed fracture: Secondary | ICD-10-CM | POA: Diagnosis not present

## 2020-08-03 ENCOUNTER — Ambulatory Visit (INDEPENDENT_AMBULATORY_CARE_PROVIDER_SITE_OTHER): Payer: Medicare Other | Admitting: Family Medicine

## 2020-08-03 ENCOUNTER — Other Ambulatory Visit: Payer: Self-pay

## 2020-08-03 ENCOUNTER — Encounter: Payer: Self-pay | Admitting: Family Medicine

## 2020-08-03 DIAGNOSIS — M79671 Pain in right foot: Secondary | ICD-10-CM | POA: Diagnosis not present

## 2020-08-03 DIAGNOSIS — M79672 Pain in left foot: Secondary | ICD-10-CM

## 2020-08-03 NOTE — Progress Notes (Signed)
Office Visit Note   Patient: Madison Holden           Date of Birth: 03/23/1946           MRN: [id number] Visit Date: 08/03/2020 Requested by: Aura Dials, [place],  [place] [id number] PCP: Aura Dials, MD  Subjective: Chief Complaint  Patient presents with  . Left Foot - Pain    Left foot injury from 07/12/20. Slid down out of a high limousine while in [place] - landed flat-footed on the curb. Pain in both feet. Had xrays yesterday at an urgent care in [facility] yesterday - brought cd. In postop shoe & using a rolling walker.    HPI: 74yo F presenting to clinic with bilateral foot pain x2.5wks. Patient states she was in a limo in [place], which had a tall drop out the door (A little over 2 feet). She underestimated the drop, and says she landed very hard on the balls of her feet with sudden pain. Since then, she has had severe, sharp pain in the 3rd toe bilaterally, with swelling along the dorsum of the foot.  The pain and swelling has improved since the initial injury, but she still remains in significant discomfort with walking. She was seen in an UC yesterday, who felt that she had a non-displaced 3rd proximal phalange fracture on the right, and gave her a post-operative shoe. She says this is comfortable, however the majority of her pain is in her left foot, and she would like this to be evaluated.               ROS:   All other systems were reviewed and are negative.  Objective: Vital Signs: There were no vitals taken for this visit.  Physical Exam:  General:  Alert and oriented, in no acute distress. Pulm:  Breathing unlabored. Psy:  Normal mood, congruent affect. Skin:  Bilateral feet with trace swelling over the dorsal aspect. No significant ecchymosis. Diffuse dry skin.    FEET EXAM General assessment: ambulates with walker assistance Seated Exam: Ankle motion: full ROM in ankle. Reduced ROM in all toes Palpation:  No Tenderness over the peroneal  tendons. No bony tenderness to palpation over the 5th metatarsal, navicular, and normal Lisfranc joint without swelling or bruising.  Does have pain at 3rd metatarsal heads bilaterally.   Ligamentous Examination:  No Tenderness or ecchymosis over the lateral ligament complex. No tenderness over the medial deltoid ligament complex  Strength: 5/5 in eversion, inversion, and plantar/dorsiflexion Normal distal sensation  Imaging: Urgent care bilateral foot X-rays reviewed, with questionable small, nondisplaced fracture of left third proximal phalange as well as the known injury in right third proximal phalange.   Assessment & Plan: 74yo F presenting to clinic 2.5 wks after a fall injury, from which she sustained bilateral foot pain, worse in the metatarsal head of both 3rd toes. Xrays reviewed from Urgent care, with possible additional fracture on the left. -Placed in a second post-operative support shoe for her left foot, which she is to wear for comfort until follow up in 3 wks -RTC in 3 wks for repeat XR -Patient leaves for [place] trip in 1 month, will contact clinic upon her return for symptom update.       Procedures: No procedures performed  No notes on file     PMFS History: Patient Active Problem List   Diagnosis Date Noted  . 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 Endometrial cancer ([place])  07/22/2018  . Acute renal failure (ARF) ([place]) 07/01/2018  . Endometrial carcinoma ([place]) 07/01/2018  . Gout 07/01/2018  . Hyperkalemia 07/01/2018  . Morbid obesity ([place]) 06/26/2018  . Chest pain 06/26/2018  . Pre-operative cardiovascular examination 06/26/2018  . Diabetes mellitus ([place]) 06/18/2018  . 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 CORONARY ATHEROSCLEROSIS 05/02/2009  . Mixed dyslipidemia 04/22/2009  . Essential hypertension 04/15/2008  . VENTRICULAR HYPERTROPHY, LEFT 04/15/2008  . ESOPHAGEAL STRICTURE 04/15/2008  . GERD 04/15/2008   Past Medical History:  Diagnosis Date  . Ambulates  with cane    due to gout   . Arthritis    hands  . Chest pain, unspecified 07-01-2018  per pt had 2 day chest pain and sob  6 weeks ago,, per pt no symptoms since   cardiology-- dr Salley Scarlet--  scheduled for stress test and echo 07-03-2018 prior to surgery scheduled 07-08-2018  . Diarrhea, unspecified    intermittant  . DOE (dyspnea on exertion)   . Endometrial cancer ([place])   . Essential hypertension   . Frequency of urination   . GERD (gastroesophageal reflux disease)   . Gout rheumologist-- michelle young PA ([facility])   07-01-2018 per pt flare up april 2019 , hands and wrists  . Heart murmur   . History of adenomatous polyp of colon   . History of esophageal dilatation    for stricture  . Mild nausea    per pt intermittant , no vomiting  . Mixed dyslipidemia   . Muscle weakness of extremity    bilateral upper and lower extremities due to gout  . Poor appetite    per pt   . Type 2 diabetes mellitus ([place])    followed by pcp  . Urgency of urination   . Urine frequency     Family History  Problem Relation Age of Onset  . Heart disease Mother   . Heart failure Mother   . Heart disease Father   . Prostate cancer Father   . Clotting disorder Father   . Diabetes Maternal Grandfather   . Colon cancer Neg Hx     Past Surgical History:  Procedure Laterality Date  . BREAST CYST EXCISION Right 1974   benign per pt  . CATARACT EXTRACTION W/ INTRAOCULAR LENS  IMPLANT, BILATERAL  2014;  2017  . MOUTH SURGERY  11/2017   Upper quadrants,  per pt peridontist did laser procedure on gums  . ROBOTIC ASSISTED TOTAL HYSTERECTOMY WITH BILATERAL SALPINGO OOPHERECTOMY Bilateral 07/22/2018   Procedure: XI ROBOTIC ASSISTED TOTAL HYSTERECTOMY WITH BILATERAL SALPINGO OOPHORECTOMY AND SENTINAL LYMPH NODE BIOPSY;  Surgeon: Everitt Amber, MD;  Location: [facility];  Service: Gynecology;  Laterality: Bilateral;  . TONSILLECTOMY  child   Social History   Occupational History  .  Occupation: retired  Tobacco Use  . Smoking status: Never Smoker  . Smokeless tobacco: Never Used  Vaping Use  . Vaping Use: Never used  Substance and Sexual Activity  . Alcohol use: Not Currently    Comment: 2-3 per  week, not currently drinking since Apri; 2019  . Drug use: No  . Sexual activity: Not on file

## 2020-08-03 NOTE — Progress Notes (Signed)
I saw and examined the patient with Dr. Elouise Munroe and agree with assessment and plan as outlined.    Bilateral foot pain due to falling 07/12/20 in [place].  X-Rays yesterday show diffuse degenerative changes in both feet.  Possible non-displaced fracture of the 2nd proximal phalanx and proximal second metatarsal of right foot.   Could be a 2nd proximal phalanx fracture on the left as well.  Will treat with post-op shoes.  Anticipate 6-12 weeks healing time.  Repeat x-ray in 3-4 weeks if not improving.

## 2020-08-12 ENCOUNTER — Telehealth: Payer: Self-pay | Admitting: Internal Medicine

## 2020-08-12 NOTE — Telephone Encounter (Signed)
    Pt c/o medication issue:  1. Name of Medication:rosuvastatin  2. How are you currently taking this medication (dosage and times per day)?   3. Are you having a reaction (difficulty breathing--STAT)?   4. What is your medication issue? [facility] called, they would like to confirm if Dr. Harrington Challenger stopped pt's rosuvastatin

## 2020-08-12 NOTE — Telephone Encounter (Signed)
Left message to call back  

## 2020-08-15 NOTE — Telephone Encounter (Signed)
Spoke with Caren Griffins who is part of the chronic care management team at PCP office.  Pt reported to them she is off Crestor but could not remember why.  She stopped due to elevated LFTs in June.  Has a lab appointment this Thursday to repeate LFTs.

## 2020-08-15 NOTE — Telephone Encounter (Signed)
Follow up:     Caren Griffins for [facility] returning a call back to speak with someone concerning this patient medication.

## 2020-08-17 DIAGNOSIS — M47812 Spondylosis without myelopathy or radiculopathy, cervical region: Secondary | ICD-10-CM | POA: Diagnosis not present

## 2020-08-17 DIAGNOSIS — I10 Essential (primary) hypertension: Secondary | ICD-10-CM | POA: Diagnosis not present

## 2020-08-17 DIAGNOSIS — Z6829 Body mass index (BMI) 29.0-29.9, adult: Secondary | ICD-10-CM | POA: Diagnosis not present

## 2020-08-18 ENCOUNTER — Other Ambulatory Visit: Payer: Medicare Other

## 2020-08-18 DIAGNOSIS — R5382 Chronic fatigue, unspecified: Secondary | ICD-10-CM | POA: Diagnosis not present

## 2020-08-18 DIAGNOSIS — M1A09X0 Idiopathic chronic gout, multiple sites, without tophus (tophi): Secondary | ICD-10-CM | POA: Diagnosis not present

## 2020-08-18 DIAGNOSIS — M0579 Rheumatoid arthritis with rheumatoid factor of multiple sites without organ or systems involvement: Secondary | ICD-10-CM | POA: Diagnosis not present

## 2020-08-18 DIAGNOSIS — M2550 Pain in unspecified joint: Secondary | ICD-10-CM | POA: Diagnosis not present

## 2020-08-19 DIAGNOSIS — M069 Rheumatoid arthritis, unspecified: Secondary | ICD-10-CM | POA: Diagnosis not present

## 2020-08-19 DIAGNOSIS — E11319 Type 2 diabetes mellitus with unspecified diabetic retinopathy without macular edema: Secondary | ICD-10-CM | POA: Diagnosis not present

## 2020-08-19 DIAGNOSIS — E7800 Pure hypercholesterolemia, unspecified: Secondary | ICD-10-CM | POA: Diagnosis not present

## 2020-08-19 DIAGNOSIS — I10 Essential (primary) hypertension: Secondary | ICD-10-CM | POA: Diagnosis not present

## 2020-08-19 DIAGNOSIS — C541 Malignant neoplasm of endometrium: Secondary | ICD-10-CM | POA: Diagnosis not present

## 2020-08-19 DIAGNOSIS — E782 Mixed hyperlipidemia: Secondary | ICD-10-CM | POA: Diagnosis not present

## 2020-08-22 ENCOUNTER — Telehealth: Payer: Self-pay | Admitting: Cardiology

## 2020-08-22 ENCOUNTER — Telehealth: Payer: Self-pay | Admitting: Internal Medicine

## 2020-08-22 DIAGNOSIS — R748 Abnormal levels of other serum enzymes: Secondary | ICD-10-CM

## 2020-08-22 DIAGNOSIS — E782 Mixed hyperlipidemia: Secondary | ICD-10-CM

## 2020-08-22 NOTE — Telephone Encounter (Signed)
Wrong Provider

## 2020-08-22 NOTE — Telephone Encounter (Signed)
Results from [facility] received and to Dr. Harrington Challenger for review.

## 2020-08-22 NOTE — Telephone Encounter (Signed)
New Message:     Pt wants to be sure that Dr Harrington Challenger received her lab results from [facility].    She also said tat she thought the Pharm D from from her primary doctor would be alling Dr Harrington Challenger or her nurse. This would be concerning her taking Rosuvastatin. Pt said she wanted Dr Harrington Challenger to know if possible tghat she does not want to take this medicine or any of the statins if possible please.

## 2020-08-25 NOTE — Telephone Encounter (Signed)
Left detailed message (DPR) that Dr. Harrington Challenger recommends she stay off statin medications for now.  3 months after being completely off - get baseline cholesterol panel.  Orders for NMR Lipomed panel.  Requested patient call in and schedule this 3 month lab appointment.  According to our notes, she may have already been off statin for 3 months.

## 2020-08-25 NOTE — Telephone Encounter (Signed)
OK. I would recomm she stay off statins  Get good baseline of cholesterol panel (get Lipomed panel) in 12 wks off meds  Will review with lipid clinic

## 2020-08-26 NOTE — Telephone Encounter (Signed)
Patient is going to [place] soon and will call to schedule Lipomed profile when she returns in December.

## 2020-08-31 DIAGNOSIS — J189 Pneumonia, unspecified organism: Secondary | ICD-10-CM | POA: Diagnosis not present

## 2020-09-06 DIAGNOSIS — Z20822 Contact with and (suspected) exposure to covid-19: Secondary | ICD-10-CM | POA: Diagnosis not present

## 2020-10-12 ENCOUNTER — Telehealth: Payer: Self-pay | Admitting: Internal Medicine

## 2020-10-12 NOTE — Telephone Encounter (Signed)
Spoke with Caren Griffins, [place] at [facility].  Adv pt remaining off statin and is due for NMR Lipomed panel at this time.

## 2020-10-12 NOTE — Telephone Encounter (Signed)
    Pt c/o medication issue:  1. Name of Medication: rosuvastatin   2. How are you currently taking this medication (dosage and times per day)?  3. Are you having a reaction (difficulty breathing--STAT)?   4. What is your medication issue? Caren Griffins with [facility] calling, she would like to now if Dr. Harrington Challenger resume pt's cholesterol meds

## 2020-10-18 DIAGNOSIS — K219 Gastro-esophageal reflux disease without esophagitis: Secondary | ICD-10-CM | POA: Diagnosis not present

## 2020-10-18 DIAGNOSIS — E7800 Pure hypercholesterolemia, unspecified: Secondary | ICD-10-CM | POA: Diagnosis not present

## 2020-10-18 DIAGNOSIS — I10 Essential (primary) hypertension: Secondary | ICD-10-CM | POA: Diagnosis not present

## 2020-10-18 DIAGNOSIS — E11319 Type 2 diabetes mellitus with unspecified diabetic retinopathy without macular edema: Secondary | ICD-10-CM | POA: Diagnosis not present

## 2020-10-20 ENCOUNTER — Telehealth: Payer: Self-pay | Admitting: Internal Medicine

## 2020-10-20 NOTE — Telephone Encounter (Signed)
Returned call to pt. Left a detailed message, per pt request, that she is overdue for an appointment and to call the office back to schedule.  Once pt schedules appointment, will send in a refill.

## 2020-10-20 NOTE — Telephone Encounter (Signed)
*  STAT* If patient is at the pharmacy, call can be transferred to refill team.   1. Which medications need to be refilled? (please list name of each medication and dose if known) amLODipine (NORVASC) 2.5 MG tablet  2. Which pharmacy/location (including street and city if local pharmacy) is medication to be sent to? [facility] -  [address] [place], [place], [place] [address]   3. Do they need a 30 day or 90 day supply?   90 day supply  Patient requested a confirmation call. However, she states if she is unavailable when the call is returned then she would like a voice message.

## 2020-10-24 MED ORDER — AMLODIPINE BESYLATE 2.5 MG PO TABS
2.5000 mg | ORAL_TABLET | Freq: Every day | ORAL | 1 refills | Status: DC
Start: 2020-10-24 — End: 2020-11-30

## 2020-10-24 NOTE — Telephone Encounter (Signed)
    Pt returned call and made an appt with Dr. Tenny Craw on 11/25/20 and she said she needs her amlodipine today

## 2020-10-24 NOTE — Addendum Note (Signed)
Addended by: Dareen Piano on: 10/24/2020 09:28 AM   Modules accepted: Orders

## 2020-11-18 DIAGNOSIS — M2550 Pain in unspecified joint: Secondary | ICD-10-CM | POA: Diagnosis not present

## 2020-11-18 DIAGNOSIS — M0579 Rheumatoid arthritis with rheumatoid factor of multiple sites without organ or systems involvement: Secondary | ICD-10-CM | POA: Diagnosis not present

## 2020-11-18 DIAGNOSIS — R5382 Chronic fatigue, unspecified: Secondary | ICD-10-CM | POA: Diagnosis not present

## 2020-11-18 DIAGNOSIS — M1A09X0 Idiopathic chronic gout, multiple sites, without tophus (tophi): Secondary | ICD-10-CM | POA: Diagnosis not present

## 2020-11-23 NOTE — Progress Notes (Unsigned)
Cardiology Office Note   Date:  11/25/2020   ID:  Madison Holden, DOB 1945/11/29, MRN [id number]  PCP:  Aura Dials, MD  Cardiologist:   Dorris Carnes, MD    F/U of HTN     History of Present Illness: Madison Holden is a 75 y.o. female with a history of HTN and DM    Pt also with mild plaquing on chest CT  Myovue in Sept 2019 was normal  Echo showed normal LVEF with mod LVH  I saw her in cilnic in Jan 2021     SInce seen she has done OK from a cardiac standpoint   HEr breathing is stable   SHe denies CP       Current Meds  Medication Sig  . acetaminophen (TYLENOL) 650 MG CR tablet Take 650 mg by mouth every 8 (eight) hours as needed for pain.  Marland Kitchen allopurinol (ZYLOPRIM) 300 MG tablet Take 300 mg by mouth daily.  Marland Kitchen amLODipine (NORVASC) 2.5 MG tablet Take 1 tablet (2.5 mg total) by mouth daily.  . ASA-APAP-Caff Buffered (VANQUISH PO) Take by mouth. As needed  . B-D TB SYRINGE 1CC/27GX1/2" 27G X 1/2" 1 ML MISC USE WEEKLY WITH METHOTREXATE SUBCUTANEOUSLY 90 DAYS  . Baclofen 5 MG TABS Take by mouth at bedtime.  . diphenhydrAMINE (BENADRYL) 25 MG tablet Take 25 mg by mouth at bedtime as needed for sleep.   Madison Holden SURECLICK 50 MG/ML injection 25 mg as directed. Two times a week  . esomeprazole (NEXIUM) 20 MG capsule Take 20 mg by mouth 3 (three) times a week.  . Ferrous Gluconate 324 (37.5 Fe) MG TABS Take 1 tablet by mouth 2 (two) times daily.  . folic acid (FOLVITE) Q000111Q MCG tablet Take 400 mcg by mouth daily.  . hydrochlorothiazide (HYDRODIURIL) 25 MG tablet Take 12.5 mg by mouth daily.  Marland Kitchen ketoconazole (NIZORAL) 2 % cream Apply 1 application topically as needed.  . Magnesium 500 MG TABS Take 500 mg by mouth every other day.  . Melatonin 3 MG TABS Take 3-6 mg by mouth at bedtime as needed (sleep).   . methotrexate 250 MG/10ML injection once a week. 0.9  . oxybutynin (DITROPAN) 5 MG tablet Take 5 mg by mouth daily. May take a second 5 mg dose as needed for bladder spasms  .  pioglitazone (ACTOS) 30 MG tablet Take 30 mg by mouth daily.   Vladimir Faster Glycol-Propyl Glycol (SYSTANE) 0.4-0.3 % SOLN 1 drop into both eyes as needed for dry eyes  . Turmeric 500 MG CAPS Take 1 capsule by mouth daily.  . vitamin B-12 (CYANOCOBALAMIN) 1000 MCG tablet Take 1,000 mcg by mouth daily.  . vitamin C (ASCORBIC ACID) 500 MG tablet Take 500 mg by mouth 4 (four) times a week.   Current Facility-Administered Medications for the 11/25/20 encounter (Office Visit) with Fay Records, MD  Medication  . methylPREDNISolone acetate (DEPO-MEDROL) injection 80 mg     Allergies:   Patient has no known allergies.   Past Medical History:  Diagnosis Date  . Ambulates with cane    due to gout   . Arthritis    hands  . Chest pain, unspecified 07-01-2018  per pt had 2 day chest pain and sob  6 weeks ago,, per pt no symptoms since   cardiology-- dr Salley Scarlet--  scheduled for stress test and echo 07-03-2018 prior to surgery scheduled 07-08-2018  . Diarrhea, unspecified    intermittant  . DOE (dyspnea on exertion)   .  Endometrial cancer ([place])   . Essential hypertension   . Frequency of urination   . GERD (gastroesophageal reflux disease)   . Gout rheumologist-- michelle young PA ([facility])   07-01-2018 per pt flare up april 2019 , hands and wrists  . Heart murmur   . History of adenomatous polyp of colon   . History of esophageal dilatation    for stricture  . Mild nausea    per pt intermittant , no vomiting  . Mixed dyslipidemia   . Muscle weakness of extremity    bilateral upper and lower extremities due to gout  . Poor appetite    per pt   . Type 2 diabetes mellitus ([place])    followed by pcp  . Urgency of urination   . Urine frequency     Past Surgical History:  Procedure Laterality Date  . BREAST CYST EXCISION Right 1974   benign per pt  . CATARACT EXTRACTION W/ INTRAOCULAR LENS  IMPLANT, BILATERAL  2014;  2017  . MOUTH SURGERY  11/2017   Upper quadrants,   per pt peridontist did laser procedure on gums  . ROBOTIC ASSISTED TOTAL HYSTERECTOMY WITH BILATERAL SALPINGO OOPHERECTOMY Bilateral 07/22/2018   Procedure: XI ROBOTIC ASSISTED TOTAL HYSTERECTOMY WITH BILATERAL SALPINGO OOPHORECTOMY AND SENTINAL LYMPH NODE BIOPSY;  Surgeon: Everitt Amber, MD;  Location: [facility];  Service: Gynecology;  Laterality: Bilateral;  . TONSILLECTOMY  child     Social History:  The patient  reports that she has never smoked. She has never used smokeless tobacco. She reports previous alcohol use. She reports that she does not use drugs.   Family History:  The patient's family history includes Clotting disorder in her father; Diabetes in her maternal grandfather; Heart disease in her father and mother; Heart failure in her mother; Prostate cancer in her father.    ROS:  Please see the history of present illness. All other systems are reviewed and  Negative to the above problem except as noted.    PHYSICAL EXAM: VS:  BP 138/70   Pulse (!) 101   Ht 4\' 10"  (1.473 m)   Wt 192 lb 6.4 oz (87.3 kg)   SpO2 97%   BMI 40.21 kg/m    GEN: Morbdily obese 75 yo in no acute distress  HEENT: normal  Neck: JVP is normal     Cardiac: RRR; no murmurs, ,no LE edema  Respiratory:  clear to auscultation bilaterally,  GI: soft, nontender, nondistended, + BS  MS: no deformity Moving all extremities   Skin: warm and dry, no rash Neuro:  Strength and sensation are intact Psych: euthymic mood, full affect   EKG:  EKG shows ST 101  bpm  NonspecificST changes    Lipid Panel    Component Value Date/Time   CHOL 125 04/18/2020 0735   TRIG 191 (H) 04/18/2020 0735   HDL 46 04/18/2020 0735   CHOLHDL 2.7 04/18/2020 0735   CHOLHDL 4 06/29/2010 0948   VLDL 41.0 (H) 06/29/2010 0948   LDLCALC 48 04/18/2020 0735   LDLDIRECT 56.6 06/29/2010 0948      Wt Readings from Last 3 Encounters:  11/25/20 192 lb 6.4 oz (87.3 kg)  02/17/20 198 lb (89.8 kg)  11/12/19 202 lb 6.4 oz (91.8 kg)       ASSESSMENT AND PLAN:  1  HTN  BP is controlled.   Keep curr regimen    2  HL    LDL is excellent at 48   Keep on  current regimen  3  CAD  Coronary calcificatiosn of CT  Pt asymptoamtic     F/U in the fall   Current medicines are reviewed at length with the patient today.  The patient does not have concerns regarding medicines.  Signed, Dorris Carnes, MD  11/25/2020 [facility] [place], [place], [place]  [address] Phone: [phone]; Fax: [phone]

## 2020-11-25 ENCOUNTER — Other Ambulatory Visit: Payer: Self-pay

## 2020-11-25 ENCOUNTER — Ambulatory Visit: Payer: Medicare Other | Admitting: Internal Medicine

## 2020-11-25 ENCOUNTER — Encounter: Payer: Self-pay | Admitting: Internal Medicine

## 2020-11-25 VITALS — BP 138/70 | HR 101 | Ht <= 58 in | Wt 192.4 lb

## 2020-11-25 DIAGNOSIS — I2510 Atherosclerotic heart disease of native coronary artery without angina pectoris: Secondary | ICD-10-CM

## 2020-11-25 DIAGNOSIS — E782 Mixed hyperlipidemia: Secondary | ICD-10-CM

## 2020-11-25 DIAGNOSIS — I10 Essential (primary) hypertension: Secondary | ICD-10-CM | POA: Diagnosis not present

## 2020-11-25 NOTE — Patient Instructions (Signed)
Medication Instructions:  Your physician recommends that you continue on your current medications as directed. Please refer to the Current Medication list given to you today.  *If you need a refill on your cardiac medications before your next appointment, please call your pharmacy*   Lab Work: None Today If you have labs (blood work) drawn today and your tests are completely normal, you will receive your results only by: Marland Kitchen MyChart Message (if you have MyChart) OR . A paper copy in the mail If you have any lab test that is abnormal or we need to change your treatment, we will call you to review the results.   Follow-Up: At [facility], you and your health needs are our priority.  As part of our continuing mission to provide you with exceptional heart care, we have created designated Provider Care Teams.  These Care Teams include your primary Cardiologist (physician) and Advanced Practice Providers (APPs -  Physician Assistants and Nurse Practitioners) who all work together to provide you with the care you need, when you need it.  Your next appointment:   10 month(s)  The format for your next appointment:   In Person  Provider:   You may see Dorris Carnes, MD or one of the following Advanced Practice Providers on your designated Care Team:    Richardson Dopp, PA-C  [place], [place]

## 2020-11-30 ENCOUNTER — Other Ambulatory Visit: Payer: Self-pay | Admitting: Internal Medicine

## 2020-12-06 DIAGNOSIS — M5136 Other intervertebral disc degeneration, lumbar region: Secondary | ICD-10-CM | POA: Diagnosis not present

## 2020-12-06 DIAGNOSIS — M47812 Spondylosis without myelopathy or radiculopathy, cervical region: Secondary | ICD-10-CM | POA: Diagnosis not present

## 2020-12-08 ENCOUNTER — Telehealth: Payer: Self-pay | Admitting: *Deleted

## 2020-12-08 NOTE — Telephone Encounter (Signed)
Patient called and scheduled a follow up appt for 5/5

## 2020-12-14 ENCOUNTER — Telehealth: Payer: Self-pay | Admitting: *Deleted

## 2020-12-14 NOTE — Telephone Encounter (Signed)
Patient called and moved her appt from 5/5 to 5/12

## 2021-01-03 DIAGNOSIS — L821 Other seborrheic keratosis: Secondary | ICD-10-CM | POA: Diagnosis not present

## 2021-01-03 DIAGNOSIS — L814 Other melanin hyperpigmentation: Secondary | ICD-10-CM | POA: Diagnosis not present

## 2021-01-17 DIAGNOSIS — E11319 Type 2 diabetes mellitus with unspecified diabetic retinopathy without macular edema: Secondary | ICD-10-CM | POA: Diagnosis not present

## 2021-01-17 DIAGNOSIS — M069 Rheumatoid arthritis, unspecified: Secondary | ICD-10-CM | POA: Diagnosis not present

## 2021-01-17 DIAGNOSIS — E782 Mixed hyperlipidemia: Secondary | ICD-10-CM | POA: Diagnosis not present

## 2021-01-17 DIAGNOSIS — I10 Essential (primary) hypertension: Secondary | ICD-10-CM | POA: Diagnosis not present

## 2021-02-13 DIAGNOSIS — L03114 Cellulitis of left upper limb: Secondary | ICD-10-CM | POA: Diagnosis not present

## 2021-02-20 DIAGNOSIS — L03113 Cellulitis of right upper limb: Secondary | ICD-10-CM | POA: Diagnosis not present

## 2021-02-23 ENCOUNTER — Ambulatory Visit: Payer: Medicare Other | Admitting: Gynecologic Oncology

## 2021-02-28 NOTE — Progress Notes (Signed)
Gynecologic Oncology Follow-up Note  Chief Complaint:  Chief Complaint  Patient presents with  . 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  Plan: Pathology revealed low risk factors for recurrence, therefore no adjuvant therapy was recommended according to NCCN guidelines.  I discussed risk for recurrence and typical symptoms encouraged her to notify us of these should they develop between visits.  I recommend she continue to have follow-up every 6 months for 5 years in accordance with NCCN guidelines. Those visits should include symptom assessment, physical exam and pelvic examination. Pap smears are not indicated or recommended in the routine surveillance of endometrial cancer.  She will follow up with me in 12 months and in 6 months with Dr Philis Pique.   HPI: 75 year old white married female seen in consultation at the request of Dr. Philis Pique regarding management of a newly diagnosed endometrial cancer.  Patient had a routine Pap smear that showed atypical glandular cells.  Cervix was stenotic requiring the patient undergo a D&C which revealed a polypoid lesion in the endometrial cavity.  Final pathology showed this to be a well-differentiated endometrial carcinoma.    At her preop visit she was noted to have hyperkalemia (7.2) and AKI. She was admitted and determined to have acute on chronic kidney injury from NSAID use in the setting of a gout flare. Supportive therapy was instituted and she recovered normalizing her labs (normal potassium at recheck on 05/15/18).   On 10.1.19 she underwent robotic assisted total hysterectomy, BSO, and bilateral SLN biopsy. Findings showed a grade 1 cancer with no myo invasion, no LVSI, negative cervical or nodal involvmement. She was staged as stage IA grade 1 endometrioid adenocarcinoma. MSI testing was stable/MMR normal. She was determined to have low risk disease and  therefore no adjuvant therapy was prescribed in accordance with NCCN guidelines.   Interval Hx: Has been diagnosed with rheumatoid arthritis. On methotrexate for this and enbril.   She traveled to [place] in the fall for a family wedding.   She is having some disability from the RA.  She has no symptoms concerning for recurrence.    Review of Systems:10 point review of systems is negative except as noted in interval history.   Vitals: Blood pressure 138/60, pulse 91, temperature 98.5 F (36.9 C), temperature source Oral, resp. rate (!) 22, height $RemoveBe'4\' 10"'NwEqiOzXv$  (1.473 m), weight 187 lb (84.8 kg), SpO2 97 %.  Allergies  Allergen Reactions  . Lovastatin   . Rosuvastatin Other (See Comments)    Past Medical History:  Diagnosis Date  . Ambulates with cane    due to gout   . Arthritis    hands  . Chest pain, unspecified 07-01-2018  per pt had 2 day chest pain and sob  6 weeks ago,, per pt no symptoms since   cardiology-- dr Salley Scarlet--  scheduled for stress test and echo 07-03-2018 prior to surgery scheduled 07-08-2018  . Diarrhea, unspecified    intermittant  . DOE (dyspnea on exertion)   . Endometrial cancer ([place])   . Essential hypertension   . Frequency of urination   . GERD (gastroesophageal reflux disease)   . Gout rheumologist-- michelle young PA ([facility])   07-01-2018 per pt flare up april 2019 , hands and wrists  . Heart murmur   . History of adenomatous polyp of colon   . History of esophageal dilatation    for stricture  . Mild nausea  per pt intermittant , no vomiting  . Mixed dyslipidemia   . Muscle weakness of extremity    bilateral upper and lower extremities due to gout  . Poor appetite    per pt   . Type 2 diabetes mellitus ([place])    followed by pcp  . Urgency of urination   . Urine frequency     Past Surgical History:  Procedure Laterality Date  . BREAST CYST EXCISION Right 1974   benign per pt  . CATARACT EXTRACTION W/ INTRAOCULAR LENS   IMPLANT, BILATERAL  2014;  2017  . MOUTH SURGERY  11/2017   Upper quadrants,  per pt peridontist did laser procedure on gums  . ROBOTIC ASSISTED TOTAL HYSTERECTOMY WITH BILATERAL SALPINGO OOPHERECTOMY Bilateral 07/22/2018   Procedure: XI ROBOTIC ASSISTED TOTAL HYSTERECTOMY WITH BILATERAL SALPINGO OOPHORECTOMY AND SENTINAL LYMPH NODE BIOPSY;  Surgeon: Everitt Amber, MD;  Location: [facility];  Service: Gynecology;  Laterality: Bilateral;  . TONSILLECTOMY  child    Current Outpatient Medications  Medication Sig Dispense Refill  . acetaminophen (TYLENOL) 650 MG CR tablet Take 650 mg by mouth every 8 (eight) hours as needed for pain.    Marland Kitchen allopurinol (ZYLOPRIM) 300 MG tablet Take 300 mg by mouth daily.    Marland Kitchen amLODipine (NORVASC) 2.5 MG tablet Take 1 tablet by mouth once daily 90 tablet 3  . B-D TB SYRINGE 1CC/27GX1/2" 27G X 1/2" 1 ML MISC USE WEEKLY WITH METHOTREXATE SUBCUTANEOUSLY 90 DAYS    . diphenhydrAMINE (BENADRYL) 25 MG tablet Take 25 mg by mouth at bedtime as needed for sleep.     Scarlette Shorts SURECLICK 50 MG/ML injection 25 mg as directed. Two times a week    . esomeprazole (NEXIUM) 40 MG capsule Take 40 mg by mouth 3 (three) times a week.    . Ferrous Gluconate 324 (37.5 Fe) MG TABS Take 1 tablet by mouth 2 (two) times daily.    . folic acid (FOLVITE) 767 MCG tablet Take 400 mcg by mouth daily.    . hydrochlorothiazide (HYDRODIURIL) 25 MG tablet Take 12.5 mg by mouth daily.    . Magnesium 500 MG TABS Take 500 mg by mouth every other day.    . Melatonin 3 MG TABS Take 3-6 mg by mouth at bedtime as needed (sleep).     . methotrexate 250 MG/10ML injection once a week. 0.9    . oxybutynin (DITROPAN) 5 MG tablet Take 5 mg by mouth daily. May take a second 5 mg dose as needed for bladder spasms    . pioglitazone (ACTOS) 30 MG tablet Take 30 mg by mouth daily.     . Turmeric 500 MG CAPS Take 1 capsule by mouth daily.    . vitamin B-12 (CYANOCOBALAMIN) 1000 MCG tablet Take 1,000 mcg by mouth daily.    .  vitamin C (ASCORBIC ACID) 500 MG tablet Take 500 mg by mouth 4 (four) times a week.    . ASA-APAP-Caff Buffered (VANQUISH PO) Take by mouth. As needed (Patient not taking: Reported on 03/02/2021)     No current facility-administered medications for this visit.    Social History   Socioeconomic History  . Marital status: Married    Spouse name: Not on file  . Number of children: 0  . Years of education: Not on file  . Highest education level: Not on file  Occupational History  . Occupation: retired  Tobacco Use  . Smoking status: Never Smoker  . Smokeless tobacco: Never Used  Vaping  Use  . Vaping Use: Never used  Substance and Sexual Activity  . Alcohol use: Not Currently    Comment: 2-3 per  week, not currently drinking since Apri; 2019  . Drug use: No  . Sexual activity: Not on file  Other Topics Concern  . Not on file  Social History Narrative   Has never smoked. Daily Caffeine Use- 2 cups per day. Does not get regular exercise. Retired.    Social Determinants of Health   Financial Resource Strain: Not on file  Food Insecurity: Not on file  Transportation Needs: Not on file  Physical Activity: Not on file  Stress: Not on file  Social Connections: Not on file  Intimate Partner Violence: Not on file    Family History  Problem Relation Age of Onset  . Heart disease Mother   . Heart failure Mother   . Heart disease Father   . Prostate cancer Father   . Clotting disorder Father   . Diabetes Maternal Grandfather   . Colon cancer Neg Hx    Physical Exam: WD in NAD Neck  Supple NROM, without any enlargements.  Lymph Node Survey No cervical supraclavicular or inguinal adenopathy Cardiovascular  Pulse normal rate, regularity and rhythm. S1 and S2 normal.  Lungs  Clear to auscultation bilateraly, without wheezes/crackles/rhonchi. Good air movement.  Skin  No rash/lesions/breakdown  Psychiatry  Alert and oriented to person, place, and time  Abdomen  Normoactive  bowel sounds, abdomen soft, non-tender and obese without evidence of hernia. Candida in folds of abdominal pannus Back No CVA tenderness Genito Urinary  Vulva/vagina: Normal external female genitalia.   No lesions. No discharge or bleeding.  Bladder/urethra:  No lesions or masses, well supported bladder  Vagina: smooth, atrophic, moderate relaxation, no lesions or masses or blood.  Adnexa: no palpable masses. Rectal  deferred Extremities  No bilateral cyanosis, clubbing or edema.   Thereasa Solo, MD 03/02/2021, 2:11 PM

## 2021-03-01 DIAGNOSIS — Z20822 Contact with and (suspected) exposure to covid-19: Secondary | ICD-10-CM | POA: Diagnosis not present

## 2021-03-02 ENCOUNTER — Inpatient Hospital Stay: Payer: Medicare Other | Attending: Gynecologic Oncology | Admitting: Gynecologic Oncology

## 2021-03-02 ENCOUNTER — Encounter: Payer: Self-pay | Admitting: Gynecologic Oncology

## 2021-03-02 ENCOUNTER — Other Ambulatory Visit: Payer: Self-pay

## 2021-03-02 VITALS — BP 138/60 | HR 91 | Temp 98.5°F | Resp 22 | Ht <= 58 in | Wt 187.0 lb

## 2021-03-02 DIAGNOSIS — E119 Type 2 diabetes mellitus without complications: Secondary | ICD-10-CM | POA: Insufficient documentation

## 2021-03-02 DIAGNOSIS — R011 Cardiac murmur, unspecified: Secondary | ICD-10-CM | POA: Insufficient documentation

## 2021-03-02 DIAGNOSIS — I10 Essential (primary) hypertension: Secondary | ICD-10-CM | POA: Insufficient documentation

## 2021-03-02 DIAGNOSIS — K219 Gastro-esophageal reflux disease without esophagitis: Secondary | ICD-10-CM | POA: Insufficient documentation

## 2021-03-02 DIAGNOSIS — Z79899 Other long term (current) drug therapy: Secondary | ICD-10-CM | POA: Insufficient documentation

## 2021-03-02 DIAGNOSIS — Z90710 Acquired absence of both cervix and uterus: Secondary | ICD-10-CM | POA: Diagnosis not present

## 2021-03-02 DIAGNOSIS — R350 Frequency of micturition: Secondary | ICD-10-CM | POA: Insufficient documentation

## 2021-03-02 DIAGNOSIS — Z90722 Acquired absence of ovaries, bilateral: Secondary | ICD-10-CM | POA: Insufficient documentation

## 2021-03-02 DIAGNOSIS — M0579 Rheumatoid arthritis with rheumatoid factor of multiple sites without organ or systems involvement: Secondary | ICD-10-CM | POA: Diagnosis not present

## 2021-03-02 DIAGNOSIS — C541 Malignant neoplasm of endometrium: Secondary | ICD-10-CM

## 2021-03-02 DIAGNOSIS — M069 Rheumatoid arthritis, unspecified: Secondary | ICD-10-CM | POA: Diagnosis not present

## 2021-03-02 NOTE — Patient Instructions (Signed)
Please notify Dr Denman George at phone number [phone] if you notice vaginal bleeding, new pelvic or abdominal pains, bloating, feeling full easy, or a change in bladder or bowel function.   Please contact Dr Serita Grit office (at [phone]) in November after your appointment with Dr Philis Pique to request an appointment with her for May, 2022.

## 2021-03-31 ENCOUNTER — Encounter (INDEPENDENT_AMBULATORY_CARE_PROVIDER_SITE_OTHER): Payer: Medicare Other | Admitting: Ophthalmology

## 2021-03-31 ENCOUNTER — Other Ambulatory Visit: Payer: Self-pay

## 2021-03-31 DIAGNOSIS — E113293 Type 2 diabetes mellitus with mild nonproliferative diabetic retinopathy without macular edema, bilateral: Secondary | ICD-10-CM

## 2021-03-31 DIAGNOSIS — H35033 Hypertensive retinopathy, bilateral: Secondary | ICD-10-CM

## 2021-03-31 DIAGNOSIS — H43813 Vitreous degeneration, bilateral: Secondary | ICD-10-CM

## 2021-03-31 DIAGNOSIS — D3132 Benign neoplasm of left choroid: Secondary | ICD-10-CM

## 2021-03-31 DIAGNOSIS — I10 Essential (primary) hypertension: Secondary | ICD-10-CM

## 2021-04-12 DIAGNOSIS — R059 Cough, unspecified: Secondary | ICD-10-CM | POA: Diagnosis not present

## 2021-04-18 DIAGNOSIS — Z01812 Encounter for preprocedural laboratory examination: Secondary | ICD-10-CM | POA: Diagnosis not present

## 2021-04-19 ENCOUNTER — Other Ambulatory Visit (HOSPITAL_BASED_OUTPATIENT_CLINIC_OR_DEPARTMENT_OTHER): Payer: Self-pay | Admitting: Family Medicine

## 2021-04-19 DIAGNOSIS — R059 Cough, unspecified: Secondary | ICD-10-CM

## 2021-04-20 DIAGNOSIS — K219 Gastro-esophageal reflux disease without esophagitis: Secondary | ICD-10-CM | POA: Diagnosis not present

## 2021-04-20 DIAGNOSIS — E11319 Type 2 diabetes mellitus with unspecified diabetic retinopathy without macular edema: Secondary | ICD-10-CM | POA: Diagnosis not present

## 2021-04-20 DIAGNOSIS — E782 Mixed hyperlipidemia: Secondary | ICD-10-CM | POA: Diagnosis not present

## 2021-04-20 DIAGNOSIS — I10 Essential (primary) hypertension: Secondary | ICD-10-CM | POA: Diagnosis not present

## 2021-04-25 ENCOUNTER — Other Ambulatory Visit: Payer: Self-pay

## 2021-04-25 ENCOUNTER — Ambulatory Visit (HOSPITAL_BASED_OUTPATIENT_CLINIC_OR_DEPARTMENT_OTHER)
Admission: RE | Admit: 2021-04-25 | Discharge: 2021-04-25 | Disposition: A | Discharge status: Home / Self Care | Payer: Medicare Other | Source: Ambulatory Visit | Attending: Family Medicine | Admitting: Family Medicine

## 2021-04-25 ENCOUNTER — Encounter (HOSPITAL_BASED_OUTPATIENT_CLINIC_OR_DEPARTMENT_OTHER): Payer: Self-pay

## 2021-04-25 DIAGNOSIS — R059 Cough, unspecified: Secondary | ICD-10-CM | POA: Diagnosis not present

## 2021-04-25 DIAGNOSIS — R590 Localized enlarged lymph nodes: Secondary | ICD-10-CM | POA: Insufficient documentation

## 2021-04-25 DIAGNOSIS — I700 Atherosclerosis of aorta: Secondary | ICD-10-CM | POA: Diagnosis not present

## 2021-04-25 DIAGNOSIS — K449 Diaphragmatic hernia without obstruction or gangrene: Secondary | ICD-10-CM | POA: Insufficient documentation

## 2021-04-25 DIAGNOSIS — I2510 Atherosclerotic heart disease of native coronary artery without angina pectoris: Secondary | ICD-10-CM | POA: Diagnosis not present

## 2021-04-25 DIAGNOSIS — R0602 Shortness of breath: Secondary | ICD-10-CM | POA: Diagnosis not present

## 2021-04-25 DIAGNOSIS — J189 Pneumonia, unspecified organism: Secondary | ICD-10-CM | POA: Diagnosis not present

## 2021-04-25 MED ORDER — IOHEXOL 300 MG/ML  SOLN
75.0000 mL | Freq: Once | INTRAMUSCULAR | Status: AC | PRN
  Administered 2021-04-25: 75 mL via INTRAVENOUS

## 2021-05-18 ENCOUNTER — Ambulatory Visit: Payer: Medicare Other | Admitting: Pulmonary Disease

## 2021-05-18 ENCOUNTER — Encounter: Payer: Self-pay | Admitting: Pulmonary Disease

## 2021-05-18 ENCOUNTER — Other Ambulatory Visit: Payer: Self-pay

## 2021-05-18 VITALS — BP 140/80 | HR 94 | Ht <= 58 in | Wt 188.6 lb

## 2021-05-18 DIAGNOSIS — M0510 Rheumatoid lung disease with rheumatoid arthritis of unspecified site: Secondary | ICD-10-CM

## 2021-05-18 DIAGNOSIS — J8410 Pulmonary fibrosis, unspecified: Secondary | ICD-10-CM

## 2021-05-18 DIAGNOSIS — J849 Interstitial pulmonary disease, unspecified: Secondary | ICD-10-CM

## 2021-05-18 DIAGNOSIS — R053 Chronic cough: Secondary | ICD-10-CM | POA: Diagnosis not present

## 2021-05-18 DIAGNOSIS — M2550 Pain in unspecified joint: Secondary | ICD-10-CM | POA: Diagnosis not present

## 2021-05-18 DIAGNOSIS — M0579 Rheumatoid arthritis with rheumatoid factor of multiple sites without organ or systems involvement: Secondary | ICD-10-CM | POA: Diagnosis not present

## 2021-05-18 DIAGNOSIS — M1A09X0 Idiopathic chronic gout, multiple sites, without tophus (tophi): Secondary | ICD-10-CM | POA: Diagnosis not present

## 2021-05-18 DIAGNOSIS — R5382 Chronic fatigue, unspecified: Secondary | ICD-10-CM | POA: Diagnosis not present

## 2021-05-18 NOTE — Patient Instructions (Signed)
Thank you for visiting Dr. Valeta Harms at [facility]. Today we recommend the following:  ILD Questionaire  PFTs and 6MWT prior to visit with MR  Return for next available New ILD Consult Dr. Chase Caller .    Please do your part to reduce the spread of COVID-19.

## 2021-05-18 NOTE — Progress Notes (Signed)
Synopsis: Referred in July 2022 for cough and abnormal CT PCP: by Aura Dials, MD  Subjective:   PATIENT ID: Madison Holden GENDER: female DOB: [birth year], MRN: [id number]  Chief Complaint  Patient presents with   Consult    Consult for chronic cough.     75 yo, HTN, arthritis, gout. C/o cough. Has been going on for several months. She went on a riverboat cruise this past year. Her cough is dry and non-productive. Cough is usually late at night or early in the morning.  From a respiratory standpoint is short of breath with exertion.  She has a dry nonproductive cough.  She has rheumatoid arthritis currently on Enbrel plus methotrexate.  She recently had a CT scan of the chest which revealed subpleural lower lobe reticulation and evidence of fibrosis.  She was sent here for evaluation of chronic cough and abnormal CT.   Past Medical History:  Diagnosis Date   Ambulates with cane    due to gout    Arthritis    hands   Chest pain, unspecified 07-01-2018  per pt had 2 day chest pain and sob  6 weeks ago,, per pt no symptoms since   cardiology-- dr Salley Scarlet--  scheduled for stress test and echo 07-03-2018 prior to surgery scheduled 07-08-2018   Diarrhea, unspecified    intermittant   DOE (dyspnea on exertion)    Endometrial cancer ([place])    Essential hypertension    Frequency of urination    GERD (gastroesophageal reflux disease)    Gout rheumologist-- michelle young PA ([facility])   07-01-2018 per pt flare up april 2019 , hands and wrists   Heart murmur    History of adenomatous polyp of colon    History of esophageal dilatation    for stricture   Mild nausea    per pt intermittant , no vomiting   Mixed dyslipidemia    Muscle weakness of extremity    bilateral upper and lower extremities due to gout   Poor appetite    per pt    Type 2 diabetes mellitus ([place])    followed by pcp   Urgency of urination    Urine frequency      Family History  Problem  Relation Age of Onset   Heart disease Mother    Heart failure Mother    Heart disease Father    Prostate cancer Father    Clotting disorder Father    Diabetes Maternal Grandfather    Colon cancer Neg Hx      Past Surgical History:  Procedure Laterality Date   BREAST CYST EXCISION Right 1974   benign per pt   CATARACT EXTRACTION W/ INTRAOCULAR LENS  IMPLANT, BILATERAL  2014;  2017   MOUTH SURGERY  11/2017   Upper quadrants,  per pt peridontist did laser procedure on gums   ROBOTIC ASSISTED TOTAL HYSTERECTOMY WITH BILATERAL SALPINGO OOPHERECTOMY Bilateral 07/22/2018   Procedure: XI ROBOTIC ASSISTED TOTAL HYSTERECTOMY WITH BILATERAL SALPINGO OOPHORECTOMY AND SENTINAL LYMPH NODE BIOPSY;  Surgeon: Everitt Amber, MD;  Location: [facility];  Service: Gynecology;  Laterality: Bilateral;   TONSILLECTOMY  child    Social History   Socioeconomic History   Marital status: Married    Spouse name: Not on file   Number of children: 0   Years of education: Not on file   Highest education level: Not on file  Occupational History   Occupation: retired  Tobacco Use   Smoking status: Never  Smokeless tobacco: Never  Vaping Use   Vaping Use: Never used  Substance and Sexual Activity   Alcohol use: Not Currently    Comment: 2-3 per  week, not currently drinking since Apri; 2019   Drug use: No   Sexual activity: Not on file  Other Topics Concern   Not on file  Social History Narrative   Has never smoked. Daily Caffeine Use- 2 cups per day. Does not get regular exercise. Retired.    Social Determinants of Health   Financial Resource Strain: Not on file  Food Insecurity: Not on file  Transportation Needs: Not on file  Physical Activity: Not on file  Stress: Not on file  Social Connections: Not on file  Intimate Partner Violence: Not on file     Allergies  Allergen Reactions   Lovastatin    Rosuvastatin Other (See Comments)     Outpatient Medications Prior to Visit  Medication Sig  Dispense Refill   acetaminophen (TYLENOL) 650 MG CR tablet Take 650 mg by mouth every 8 (eight) hours as needed for pain.     allopurinol (ZYLOPRIM) 300 MG tablet Take 300 mg by mouth daily.     amLODipine (NORVASC) 2.5 MG tablet Take 1 tablet by mouth once daily 90 tablet 3   ASA-APAP-Caff Buffered (VANQUISH PO) Take by mouth. As needed     B-D TB SYRINGE 1CC/27GX1/2" 27G X 1/2" 1 ML MISC USE WEEKLY WITH METHOTREXATE SUBCUTANEOUSLY 90 DAYS     diphenhydrAMINE (BENADRYL) 25 MG tablet Take 25 mg by mouth at bedtime as needed for sleep.      ENBREL SURECLICK 50 MG/ML injection 25 mg as directed. Two times a week     esomeprazole (NEXIUM) 40 MG capsule Take 40 mg by mouth 3 (three) times a week.     Ferrous Gluconate 324 (37.5 Fe) MG TABS Take 1 tablet by mouth 2 (two) times daily.     folic acid (FOLVITE) Q000111Q MCG tablet Take 400 mcg by mouth daily.     hydrochlorothiazide (HYDRODIURIL) 25 MG tablet Take 12.5 mg by mouth daily.     Magnesium 500 MG TABS Take 500 mg by mouth every other day.     Melatonin 3 MG TABS Take 3-6 mg by mouth at bedtime as needed (sleep).      methotrexate 250 MG/10ML injection once a week. 0.9     oxybutynin (DITROPAN) 5 MG tablet Take 5 mg by mouth daily. May take a second 5 mg dose as needed for bladder spasms     pioglitazone (ACTOS) 30 MG tablet Take 30 mg by mouth daily.      Turmeric 500 MG CAPS Take 1 capsule by mouth daily.     vitamin B-12 (CYANOCOBALAMIN) 1000 MCG tablet Take 1,000 mcg by mouth daily.     vitamin C (ASCORBIC ACID) 500 MG tablet Take 500 mg by mouth 4 (four) times a week.     No facility-administered medications prior to visit.    Review of Systems  Constitutional:  Negative for chills, fever, malaise/fatigue and weight loss.  HENT:  Negative for hearing loss, sore throat and tinnitus.   Eyes:  Negative for blurred vision and double vision.  Respiratory:  Positive for cough and shortness of breath. Negative for hemoptysis, sputum  production, wheezing and stridor.   Cardiovascular:  Negative for chest pain, palpitations, orthopnea, leg swelling and PND.  Gastrointestinal:  Negative for abdominal pain, constipation, diarrhea, heartburn, nausea and vomiting.  Genitourinary:  Negative for  dysuria, hematuria and urgency.  Musculoskeletal:  Positive for joint pain. Negative for myalgias.  Skin:  Negative for itching and rash.  Neurological:  Negative for dizziness, tingling, weakness and headaches.  Endo/Heme/Allergies:  Negative for environmental allergies. Does not bruise/bleed easily.  Psychiatric/Behavioral:  Negative for depression. The patient is not nervous/anxious and does not have insomnia.   All other systems reviewed and are negative.   Objective:  Physical Exam Vitals reviewed.  Constitutional:      General: She is not in acute distress.    Appearance: She is well-developed.  HENT:     Head: Normocephalic and atraumatic.  Eyes:     General: No scleral icterus.    Conjunctiva/sclera: Conjunctivae normal.     Pupils: Pupils are equal, round, and reactive to light.  Neck:     Vascular: No JVD.     Trachea: No tracheal deviation.  Cardiovascular:     Rate and Rhythm: Normal rate and regular rhythm.     Heart sounds: Normal heart sounds. No murmur heard. Pulmonary:     Effort: Pulmonary effort is normal. No tachypnea, accessory muscle usage or respiratory distress.     Breath sounds: No stridor. No wheezing, rhonchi or rales.     Comments: Bilateral inspiratory crackles Abdominal:     General: Bowel sounds are normal. There is no distension.     Palpations: Abdomen is soft.     Tenderness: There is no abdominal tenderness.  Musculoskeletal:        General: No tenderness.     Cervical back: Neck supple.  Lymphadenopathy:     Cervical: No cervical adenopathy.  Skin:    General: Skin is warm and dry.     Capillary Refill: Capillary refill takes less than 2 seconds.     Findings: No rash.   Neurological:     Mental Status: She is alert and oriented to person, place, and time.  Psychiatric:        Behavior: Behavior normal.     Vitals:   05/18/21 1501  BP: 140/80  Pulse: 94  SpO2: 92%  Weight: 188 lb 9.6 oz (85.5 kg)  Height: '4\' 10"'$  (1.473 m)   92% on RA BMI Readings from Last 3 Encounters:  05/18/21 39.42 kg/m  03/02/21 39.08 kg/m  11/25/20 40.21 kg/m   Wt Readings from Last 3 Encounters:  05/18/21 188 lb 9.6 oz (85.5 kg)  03/02/21 187 lb (84.8 kg)  11/25/20 192 lb 6.4 oz (87.3 kg)     CBC    Component Value Date/Time   WBC 9.6 07/23/2018 0341   RBC 2.85 (L) 07/23/2018 0341   HGB 7.8 (L) 07/23/2018 0341   HCT 25.4 (L) 07/23/2018 0341   PLT 643 (H) 07/23/2018 0341   MCV 89.1 07/23/2018 0341   MCH 27.4 07/23/2018 0341   MCHC 30.7 07/23/2018 0341   RDW 19.2 (H) 07/23/2018 0341   LYMPHSABS 0.9 07/01/2018 1420   MONOABS 0.5 07/01/2018 1420   EOSABS 0.1 07/01/2018 1420   BASOSABS 0.0 07/01/2018 1420     Chest Imaging: July 2022 CT chest: Bilateral lower lobe areas of reticulation and early fibrosis. The patient's images have been independently reviewed by me.    Pulmonary Functions Testing Results: No flowsheet data found.  FeNO:   Pathology:   Echocardiogram:   Heart Catheterization:     Assessment & Plan:     ICD-10-CM   1. 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 Chronic cough  R05.3     4. Pulmonary fibrosis ([place])  J84.10       Discussion:  This is a 75 year old female, longstanding history of rheumatoid arthritis on Enbrel plus methotrexate.  She has chronic nonproductive cough CT imaging concerning for interstitial lung disease.  Also has evidence of fibrosis.  I suspect she has RA ILD.  Plan: Continue Enbrel plus methotrexate Referral to our interstitial lung disease clinic. Will complete pulmonary function test and 6-minute walk test  prior to office visit. Patient also given ILD questionnaire to have complete prior to visit with Dr. Chase Caller. Hopefully they will build to discuss utility of potential change in her immune suppression versus addition of antifibrotic's if patient is a candidate.   Current Outpatient Medications:    acetaminophen (TYLENOL) 650 MG CR tablet, Take 650 mg by mouth every 8 (eight) hours as needed for pain., Disp: , Rfl:    allopurinol (ZYLOPRIM) 300 MG tablet, Take 300 mg by mouth daily., Disp: , Rfl:    amLODipine (NORVASC) 2.5 MG tablet, Take 1 tablet by mouth once daily, Disp: 90 tablet, Rfl: 3   ASA-APAP-Caff Buffered (VANQUISH PO), Take by mouth. As needed, Disp: , Rfl:    B-D TB SYRINGE 1CC/27GX1/2" 27G X 1/2" 1 ML MISC, USE WEEKLY WITH METHOTREXATE SUBCUTANEOUSLY 90 DAYS, Disp: , Rfl:    diphenhydrAMINE (BENADRYL) 25 MG tablet, Take 25 mg by mouth at bedtime as needed for sleep. , Disp: , Rfl:    ENBREL SURECLICK 50 MG/ML injection, 25 mg as directed. Two times a week, Disp: , Rfl:    esomeprazole (NEXIUM) 40 MG capsule, Take 40 mg by mouth 3 (three) times a week., Disp: , Rfl:    Ferrous Gluconate 324 (37.5 Fe) MG TABS, Take 1 tablet by mouth 2 (two) times daily., Disp: , Rfl:    folic acid (FOLVITE) Q000111Q MCG tablet, Take 400 mcg by mouth daily., Disp: , Rfl:    hydrochlorothiazide (HYDRODIURIL) 25 MG tablet, Take 12.5 mg by mouth daily., Disp: , Rfl:    Magnesium 500 MG TABS, Take 500 mg by mouth every other day., Disp: , Rfl:    Melatonin 3 MG TABS, Take 3-6 mg by mouth at bedtime as needed (sleep). , Disp: , Rfl:    methotrexate 250 MG/10ML injection, once a week. 0.9, Disp: , Rfl:    oxybutynin (DITROPAN) 5 MG tablet, Take 5 mg by mouth daily. May take a second 5 mg dose as needed for bladder spasms, Disp: , Rfl:    pioglitazone (ACTOS) 30 MG tablet, Take 30 mg by mouth daily. , Disp: , Rfl:    Turmeric 500 MG CAPS, Take 1 capsule by mouth daily., Disp: , Rfl:    vitamin B-12  (CYANOCOBALAMIN) 1000 MCG tablet, Take 1,000 mcg by mouth daily., Disp: , Rfl:    vitamin C (ASCORBIC ACID) 500 MG tablet, Take 500 mg by mouth 4 (four) times a week., Disp: , Rfl:     Garner Nash, DO [facility] 05/18/2021 3:43 PM

## 2021-05-23 ENCOUNTER — Other Ambulatory Visit: Payer: Self-pay | Admitting: Pulmonary Disease

## 2021-05-23 DIAGNOSIS — J849 Interstitial pulmonary disease, unspecified: Secondary | ICD-10-CM

## 2021-05-24 ENCOUNTER — Other Ambulatory Visit: Payer: Self-pay

## 2021-05-24 ENCOUNTER — Ambulatory Visit (INDEPENDENT_AMBULATORY_CARE_PROVIDER_SITE_OTHER): Payer: Medicare Other | Admitting: Pulmonary Disease

## 2021-05-24 DIAGNOSIS — J849 Interstitial pulmonary disease, unspecified: Secondary | ICD-10-CM

## 2021-05-24 LAB — PULMONARY FUNCTION TEST
DL/VA % pred: 65 %
DL/VA: 2.83 ml/min/mmHg/L
DLCO cor % pred: 47 %
DLCO cor: 7.67 ml/min/mmHg
DLCO unc % pred: 47 %
DLCO unc: 7.67 ml/min/mmHg
FEF 25-75 Post: 2.98 L/sec
FEF 25-75 Pre: 2.98 L/sec
FEF2575-%Change-Post: 0 %
FEF2575-%Pred-Post: 195 %
FEF2575-%Pred-Pre: 195 %
FEV1-%Change-Post: 0 %
FEV1-%Pred-Post: 83 %
FEV1-%Pred-Pre: 83 %
FEV1-Post: 1.46 L
FEV1-Pre: 1.47 L
FEV1FVC-%Change-Post: 0 %
FEV1FVC-%Pred-Pre: 123 %
FEV6-%Change-Post: 0 %
FEV6-%Pred-Post: 70 %
FEV6-%Pred-Pre: 70 %
FEV6-Post: 1.57 L
FEV6-Pre: 1.57 L
FEV6FVC-%Change-Post: 0 %
FEV6FVC-%Pred-Post: 105 %
FEV6FVC-%Pred-Pre: 105 %
FVC-%Change-Post: 0 %
FVC-%Pred-Post: 66 %
FVC-%Pred-Pre: 67 %
FVC-Post: 1.57 L
FVC-Pre: 1.57 L
Post FEV1/FVC ratio: 94 %
Post FEV6/FVC ratio: 100 %
Pre FEV1/FVC ratio: 93 %
Pre FEV6/FVC Ratio: 100 %
RV % pred: 50 %
RV: 1.01 L
TLC % pred: 63 %
TLC: 2.73 L

## 2021-05-24 NOTE — Progress Notes (Signed)
PFT done today. 

## 2021-05-30 ENCOUNTER — Ambulatory Visit (INDEPENDENT_AMBULATORY_CARE_PROVIDER_SITE_OTHER): Payer: Medicare Other

## 2021-05-30 ENCOUNTER — Other Ambulatory Visit: Payer: Self-pay

## 2021-05-30 DIAGNOSIS — J849 Interstitial pulmonary disease, unspecified: Secondary | ICD-10-CM | POA: Diagnosis not present

## 2021-05-30 NOTE — Progress Notes (Signed)
Six Minute Walk - 05/30/21 1436       Six Minute Walk   Medications taken before test (dose and time) Tylenol '650mg'$  (0600&1300), Ferrous Gluconate '324mg'$  (10am), Oxybutynin '5mg'$  (1200)    Supplemental oxygen during test? No    Lap distance in meters  34 meters    Laps Completed 6    Partial lap (in meters) 11 meters    Baseline BP (sitting) 128/72    Baseline Heartrate 95    Baseline Dyspnea (Borg Scale) 6    Baseline Fatigue (Borg Scale) 3    Baseline SPO2 95 %   RA     End of Test Values    BP (sitting) 134/78    Heartrate 100    Dyspnea (Borg Scale) 7    Fatigue (Borg Scale) 9    SPO2 87 %   RA     2 Minutes Post Walk Values   BP (sitting) 130/76    Heartrate 96    SPO2 93 %   RA   Stopped or paused before six minutes? No    Other Symptoms at end of exercise: --   denies any problems     Interpretation   Distance completed 215 meters    Tech Comments: Ambulated at a moderate pace for first 2 laps, then slowed to a slow, steady pace.  No pauses or stops.  No sign of distress. Sats were 87% RA at the end of test, but increased to 90's.

## 2021-05-31 ENCOUNTER — Telehealth: Payer: Self-pay | Admitting: Internal Medicine

## 2021-05-31 ENCOUNTER — Encounter: Payer: Self-pay | Admitting: Internal Medicine

## 2021-05-31 ENCOUNTER — Ambulatory Visit: Payer: Medicare Other | Admitting: Internal Medicine

## 2021-05-31 VITALS — BP 126/78 | HR 79 | Temp 97.6°F | Ht 59.0 in | Wt 188.0 lb

## 2021-05-31 DIAGNOSIS — R0902 Hypoxemia: Secondary | ICD-10-CM | POA: Diagnosis not present

## 2021-05-31 DIAGNOSIS — R0600 Dyspnea, unspecified: Secondary | ICD-10-CM | POA: Diagnosis not present

## 2021-05-31 DIAGNOSIS — J8489 Other specified interstitial pulmonary diseases: Secondary | ICD-10-CM

## 2021-05-31 DIAGNOSIS — D84821 Immunodeficiency due to drugs: Secondary | ICD-10-CM

## 2021-05-31 DIAGNOSIS — R053 Chronic cough: Secondary | ICD-10-CM

## 2021-05-31 DIAGNOSIS — K449 Diaphragmatic hernia without obstruction or gangrene: Secondary | ICD-10-CM

## 2021-05-31 DIAGNOSIS — Z79899 Other long term (current) drug therapy: Secondary | ICD-10-CM

## 2021-05-31 DIAGNOSIS — M359 Systemic involvement of connective tissue, unspecified: Secondary | ICD-10-CM

## 2021-05-31 DIAGNOSIS — R0609 Other forms of dyspnea: Secondary | ICD-10-CM

## 2021-05-31 NOTE — Telephone Encounter (Signed)
PATIENT: Madison Holden GENDER: female MRN: [id number] DOB: 11/23/1945 ADDRESS: [address] [place] [address]    Please schedule the following:  Diagnosis: ILD and immunesupprseed, chronic cough Procedure: Video bronchocoopy, flexible bronchoscopy with BAL +/- Endobronchial biopsy Envisia Classifer Transbronchial biopsy: NO Anesthesia: genereaal due to medical issues Do you need Fluro? no Size of Scope: regular Pre-med nebulized lidocaine: not needed Priority: see below Date: sept 6-9 when [facility] at [employer] rotation Alternate Date: above  Time: AMpreferred/ PMok Location: [place] Does patient have OSA? progabbly DM?  See below Or Latex allergy? See below Medication Restriction: NPO except meds. Ensure no antiocagulation Anticoagulate/Antiplatelet: she is not on it Pre-op Labs Ordered: none needed Imaging request: none       MISCELLANEOUS KEY INSTRUCTIONS    Please coordinate Pre-op COVID Testing   Please let Dr Chase Caller know via reply phone message on Epic  Thank you     Key patient medical info     Allergy History:  Allergies  Allergen Reactions   Lovastatin    Rosuvastatin Other (See Comments)     Current Outpatient Medications:    acetaminophen (TYLENOL) 650 MG CR tablet, Take 650 mg by mouth every 8 (eight) hours as needed for pain., Disp: , Rfl:    allopurinol (ZYLOPRIM) 300 MG tablet, Take 300 mg by mouth daily., Disp: , Rfl:    amLODipine (NORVASC) 2.5 MG tablet, Take 1 tablet by mouth once daily, Disp: 90 tablet, Rfl: 3   ASA-APAP-Caff Buffered (VANQUISH PO), Take by mouth. As needed, Disp: , Rfl:    B-D TB SYRINGE 1CC/27GX1/2" 27G X 1/2" 1 ML MISC, USE WEEKLY WITH METHOTREXATE SUBCUTANEOUSLY 90 DAYS, Disp: , Rfl:    diphenhydrAMINE (BENADRYL) 25 MG tablet, Take 25 mg by mouth at bedtime as needed for sleep. , Disp: , Rfl:    esomeprazole (NEXIUM) 40 MG capsule, Take 40 mg by mouth 3 (three) times a week., Disp: , Rfl:    etanercept  (ENBREL) 50 MG/ML injection, Inject 25 mg into the skin 2 (two) times a week., Disp: , Rfl:    Ferrous Gluconate 324 (37.5 Fe) MG TABS, Take 1 tablet by mouth 2 (two) times daily., Disp: , Rfl:    folic acid (FOLVITE) 638 MCG tablet, Take 400 mcg by mouth daily., Disp: , Rfl:    hydrochlorothiazide (HYDRODIURIL) 25 MG tablet, Take 12.5 mg by mouth daily., Disp: , Rfl:    Magnesium 500 MG TABS, Take 500 mg by mouth every other day., Disp: , Rfl:    Melatonin 3 MG TABS, Take 3-6 mg by mouth at bedtime as needed (sleep). , Disp: , Rfl:    methotrexate 250 MG/10ML injection, once a week. 0.9, Disp: , Rfl:    oxybutynin (DITROPAN) 5 MG tablet, Take 5 mg by mouth daily. May take a second 5 mg dose as needed for bladder spasms, Disp: , Rfl:    pioglitazone (ACTOS) 30 MG tablet, Take 30 mg by mouth daily. , Disp: , Rfl:    Turmeric 500 MG CAPS, Take 1 capsule by mouth daily., Disp: , Rfl:    vitamin B-12 (CYANOCOBALAMIN) 1000 MCG tablet, Take 1,000 mcg by mouth daily., Disp: , Rfl:    vitamin C (ASCORBIC ACID) 500 MG tablet, Take 500 mg by mouth 4 (four) times a week., Disp: , Rfl:    has a past medical history of Ambulates with cane, Arthritis, Chest pain, unspecified (07-01-2018  per pt had 2 day chest pain and  sob  6 weeks ago,, per pt no symptoms since), Diarrhea, unspecified, DOE (dyspnea on exertion), Endometrial cancer ([place]), Essential hypertension, Frequency of urination, GERD (gastroesophageal reflux disease), Gout (rheumologist-- michelle young PA ([facility])), Heart murmur, History of adenomatous polyp of colon, History of esophageal dilatation, Mild nausea, Mixed dyslipidemia, Muscle weakness of extremity, Poor appetite, Type 2 diabetes mellitus ([place]), Urgency of urination, and Urine frequency.    has a past surgical history that includes Mouth surgery (11/2017); Cataract extraction w/ intraocular lens  implant, bilateral (2014;  2017); Tonsillectomy (child); Breast cyst excision  (Right, 1974); and Robotic assisted total hysterectomy with bilateral salpingo oophorectomy (Bilateral, 07/22/2018).   SIGNATURE    Dr. Brand Males, M.D., F.C.C.P,  Pulmonary and Critical Care Medicine Staff Physician, [place] Director - Interstitial Lung Disease  Program  Pulmonary [place] at [place], [place], [address]  Pager: [phone], If no answer or between  15:00h - 7:00h: call [phone] Telephone: [phone]  5:45 PM 05/31/2021

## 2021-05-31 NOTE — Progress Notes (Signed)
IOV 05/18/21 - Dr Valeta Harms  Synopsis: Referred in July 2022 for cough and abnormal CT PCP: by Aura Dials, MD  Subjective:   PATIENT ID: Madison Holden GENDER: female DOB: 1946/08/16, MRN: [id number]  Chief Complaint  Patient presents with   Consult    Consult for chronic cough.     75 yo, HTN, arthritis, gout. C/o cough. Has been going on for several months. She went on a riverboat cruise this past year. Her cough is dry and non-productive. Cough is usually late at night or early in the morning.  From a respiratory standpoint is short of breath with exertion.  She has a dry nonproductive cough.  She has rheumatoid arthritis currently on Enbrel plus methotrexate.  She recently had a CT scan of the chest which revealed subpleural lower lobe reticulation and evidence of fibrosis.  She was sent here for evaluation of chronic cough and abnormal CT.    OV 05/31/2021 -transferred to Dr. Chase Caller in the ILD center  Subjective:  Patient ID: Madison Holden, female , DOB: 15-Mar-1946 , age 75 y.o. , MRN: [id number] , ADDRESS: [address] [place] [address] PCP Aura Dials, MD Patient Care Team: Aura Dials, MD as PCP - General (Family Medicine) Fay Records, MD as PCP - Cardiology (Cardiology) Orpah Melter, MD (Family Medicine)  This Provider for this visit: Treatment Team:  Attending Provider: Brand Males, MD    05/31/2021 -   Chief Complaint  Patient presents with   Follow-up    Pt saw Dr. Valeta Harms and is now being switched to Dr. Chase Caller. States she has had an occ cough that is worse in the mornings when she first wakes up.     HPI Madison Holden [facility] 75 y.o. -history is provided by the patient, her husband and also review of the medical records.  She sees Madison Holden at [facility]ology.  She tells me that she has had a diagnosis of gout for few years.  Then approximately in early 2020 she had a diagnosis of rheumatoid arthritis given to  her.  She does have rheumatoid arthritis deformities in her hands.  Shortly after that she was started on methotrexate which she says also for her gout.  She was also then started on Enbrel.  She believes that in 2019 September [personally visualized and confirmed) she had a CT scan of the abdomen that on the lung images she had presence of ILD.  This was followed with a CT chest with contrast that is described with reticulation but no honeycombing.  Pulmonary consultation was recommended but because of the pandemic she never could get to it.  She says at baseline she just has very minimal shortness of breath but then she is obese and sedentary and therefore she not able to experience her shortness of breath.  Then approximately February 2022 started having chronic dry cough that is persistent since then.  She has been referred here for the cough.  Yesterday on 6-minute walk test she did desaturate to 87% but she says she does not normally exert that much.  She does not believe portable oxygen will help her.  She believes her current regimen of methotrexate and Enbrel is controlling her pain quite well.   In 2019 she did have a normal myocardial perfusion stress test. Pulmonary function test yesterday shows restriction with low diffusion. 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  Mostly sedentary.   PFT  PFT Results Latest Ref Rng & Units 05/24/2021  FVC-Pre L 1.57  FVC-Predicted Pre % 67  FVC-Post L 1.57  FVC-Predicted Post % 66  Pre FEV1/FVC % % 93  Post FEV1/FCV % % 94  FEV1-Pre L 1.47  FEV1-Predicted Pre % 83  FEV1-Post L 1.46  DLCO uncorrected ml/min/mmHg 7.67  DLCO UNC% % 47  DLCO corrected ml/min/mmHg 7.67  DLCO COR %Predicted % 47  DLVA Predicted % 65  TLC L 2.73  TLC % Predicted % 63  RV %  Predicted % 50   CT chest 04/25/21 FINDINGS: Cardiovascular: Atherosclerotic calcification of the aorta, aortic valve and coronary arteries. Pulmonic trunk and heart are enlarged. No pericardial effusion.   Mediastinum/Nodes: Subcentimeter low-attenuation lesion in the right thyroid. No follow-up recommended. (Ref: J Am Coll Radiol. 2015 Feb;12(2): 143-50).Mediastinal lymph nodes measure up to 1.3 cm in the low right paratracheal station, unchanged. Bihilar lymph nodes measure up to 1.3 cm on the right, also unchanged. No axillary adenopathy. Esophagus is grossly unremarkable. Moderate hiatal hernia.   Lungs/Pleura: Peripheral and somewhat basilar predominant subpleural reticulation, ground-glass and traction bronchiectasis/bronchiolectasis, as on 11/20/2018. No pleural fluid. Airway is unremarkable. IMPRESSION: 1. Pulmonary parenchymal pattern of fibrosis is unchanged from 11/20/2018 and most likely due to usual interstitial pneumonitis. Future evaluation utilizing high-resolution chest CT without contrast is suggested. 2. Chronically enlarged mediastinal and hilar lymph nodes, likely related interstitial lung disease. 3. Moderate hiatal hernia. 4. Aortic atherosclerosis (ICD10-I70.0). Coronary artery calcification. 5. Enlarged pulmonic trunk, indicative of pulmonary arterial hypertension.     Electronically Signed   By: Lorin Picket M.D.   On: 04/26/2021 07:56      has a past medical history of Ambulates with cane, Arthritis, Chest pain, unspecified (07-01-2018  per pt had 2 day chest pain and sob  6 weeks ago,, per pt no symptoms since), Diarrhea, unspecified, DOE (dyspnea on exertion), Endometrial cancer ([place]), Essential hypertension, Frequency of urination, GERD (gastroesophageal reflux disease), Gout (rheumologist-- michelle young PA ([facility])), Heart murmur, History of adenomatous polyp of colon, History of esophageal dilatation, Mild nausea, Mixed  dyslipidemia, Muscle weakness of extremity, Poor appetite, Type 2 diabetes mellitus ([place]), Urgency of urination, and Urine frequency.   reports that she has never smoked. She has never used smokeless tobacco.  Past Surgical History:  Procedure Laterality Date   BREAST CYST EXCISION Right 1974   benign per pt   CATARACT EXTRACTION W/ INTRAOCULAR LENS  IMPLANT, BILATERAL  2014;  2017   MOUTH SURGERY  11/2017   Upper quadrants,  per pt peridontist did laser procedure on gums   ROBOTIC ASSISTED TOTAL HYSTERECTOMY WITH BILATERAL SALPINGO OOPHERECTOMY Bilateral 07/22/2018   Procedure: XI ROBOTIC ASSISTED TOTAL HYSTERECTOMY WITH BILATERAL SALPINGO OOPHORECTOMY AND SENTINAL LYMPH NODE BIOPSY;  Surgeon: Everitt Amber, MD;  Location: [facility];  Service: Gynecology;  Laterality: Bilateral;   TONSILLECTOMY  child    Allergies  Allergen Reactions   Lovastatin    Rosuvastatin Other (See Comments)    Immunization History  Administered Date(s) Administered   Moderna Sars-Covid-2 Vaccination 11/26/2019, 12/22/2019, 08/12/2020   Pneumococcal Conjugate-13 01/29/2017   Pneumococcal Polysaccharide-23 08/01/2012   Tdap 01/17/2011   Zoster, Live 05/15/2012    Family History  Problem Relation Age of Onset   Heart disease Mother    Heart failure Mother  Heart disease Father    Prostate cancer Father    Clotting disorder Father    Diabetes Maternal Grandfather    Colon cancer Neg Hx      Current Outpatient Medications:    acetaminophen (TYLENOL) 650 MG CR tablet, Take 650 mg by mouth every 8 (eight) hours as needed for pain., Disp: , Rfl:    allopurinol (ZYLOPRIM) 300 MG tablet, Take 300 mg by mouth daily., Disp: , Rfl:    amLODipine (NORVASC) 2.5 MG tablet, Take 1 tablet by mouth once daily, Disp: 90 tablet, Rfl: 3   ASA-APAP-Caff Buffered (VANQUISH PO), Take by mouth. As needed, Disp: , Rfl:    B-D TB SYRINGE 1CC/27GX1/2" 27G X 1/2" 1 ML MISC, USE WEEKLY WITH METHOTREXATE SUBCUTANEOUSLY 90 DAYS,  Disp: , Rfl:    diphenhydrAMINE (BENADRYL) 25 MG tablet, Take 25 mg by mouth at bedtime as needed for sleep. , Disp: , Rfl:    esomeprazole (NEXIUM) 40 MG capsule, Take 40 mg by mouth 3 (three) times a week., Disp: , Rfl:    etanercept (ENBREL) 50 MG/ML injection, Inject 25 mg into the skin 2 (two) times a week., Disp: , Rfl:    Ferrous Gluconate 324 (37.5 Fe) MG TABS, Take 1 tablet by mouth 2 (two) times daily., Disp: , Rfl:    folic acid (FOLVITE) Q000111Q MCG tablet, Take 400 mcg by mouth daily., Disp: , Rfl:    hydrochlorothiazide (HYDRODIURIL) 25 MG tablet, Take 12.5 mg by mouth daily., Disp: , Rfl:    Magnesium 500 MG TABS, Take 500 mg by mouth every other day., Disp: , Rfl:    Melatonin 3 MG TABS, Take 3-6 mg by mouth at bedtime as needed (sleep). , Disp: , Rfl:    methotrexate 250 MG/10ML injection, once a week. 0.9, Disp: , Rfl:    oxybutynin (DITROPAN) 5 MG tablet, Take 5 mg by mouth daily. May take a second 5 mg dose as needed for bladder spasms, Disp: , Rfl:    pioglitazone (ACTOS) 30 MG tablet, Take 30 mg by mouth daily. , Disp: , Rfl:    Turmeric 500 MG CAPS, Take 1 capsule by mouth daily., Disp: , Rfl:    vitamin B-12 (CYANOCOBALAMIN) 1000 MCG tablet, Take 1,000 mcg by mouth daily., Disp: , Rfl:    vitamin C (ASCORBIC ACID) 500 MG tablet, Take 500 mg by mouth 4 (four) times a week., Disp: , Rfl:       Objective:   Vitals:   05/31/21 1647  BP: 126/78  Pulse: 79  Temp: 97.6 F (36.4 C)  TempSrc: Oral  SpO2: 96%  Weight: 188 lb (85.3 kg)  Height: '4\' 11"'$  (1.499 m)    Estimated body mass index is 37.97 kg/m as calculated from the following:   Height as of this encounter: '4\' 11"'$  (1.499 m).   Weight as of this encounter: 188 lb (85.3 kg).  '@WEIGHTCHANGE'$ @  [employer]   05/31/21 1647  Weight: 188 lb (85.3 kg)     Physical Exam  General Appearance:    Alert, cooperative, no distress, appears stated age - yes , Deconditioned looking - no , OBESE  - yes, Sitting on  Wheelchair -  no but ha s troller  Head:    Normocephalic, without obvious abnormality, atraumatic  Eyes:    PERRL, conjunctiva/corneas clear,  Ears:    Normal TM's and external ear canals, both ears  Nose:   Nares normal, septum midline, mucosa normal, no drainage  or sinus tenderness. OXYGEN ON  - no . Patient is @ ra   Throat:   Lips, mucosa, and tongue normal; teeth and gums normal. Cyanosis on lips - no  Neck:   Supple, symmetrical, trachea midline, no adenopathy;    thyroid:  no enlargement/tenderness/nodules; no carotid   bruit or JVD  Back:     Symmetric, no curvature, ROM normal, no CVA tenderness  Lungs:     Distress - no , Wheeze no, Barrell Chest - no, Purse lip breathing - no, Crackles - Classic Velcro crackles of UIP with a craniocaudal gradient particularly in the lung base  Chest Wall:    No tenderness or deformity.    Heart:    Regular rate and rhythm, S1 and S2 normal, no rub   or gallop, Murmur - no  Breast Exam:    NOT DONE  Abdomen:     Soft, non-tender, bowel sounds active all four quadrants,    no masses, no organomegaly. Visceral obesity - yes  Genitalia:   NOT DONE  Rectal:   NOT DONE  Extremities:   Extremities - normal, Has Cane - no, Clubbing - no,  Edema - no  Pulses:   2+ and symmetric all extremities  Skin:   Stigmata of Connective Tissue Disease - yes of RA  Lymph nodes:   Cervical, supraclavicular, and axillary nodes normal  Psychiatric:  Neurologic:   Pleasant - yes, Anxious - no, Flat affect - no  CAm-ICU - neg, Alert and Oriented x 3 - yes, Moves all 4s - yes, Speech - normal, Cognition - intact         Assessment:       ICD-10-CM   1. Interstitial lung disease due to connective tissue disease (HCC)  J84.89 Pulse oximetry, overnight   M35.9 ECHOCARDIOGRAM COMPLETE    2. Chronic cough  R05.3     3. Exercise hypoxemia  R09.02 ECHOCARDIOGRAM COMPLETE    4. Dyspnea on exertion  R06.00 ECHOCARDIOGRAM COMPLETE    5. 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 Hiatal hernia  K44.9          Plan:     Patient Instructions     ICD-10-CM   1. Interstitial lung disease due to connective tissue disease (HCC)  J84.89 Pulse oximetry, overnight   M35.9 ECHOCARDIOGRAM COMPLETE    2. Chronic cough  R05.3     3. Exercise hypoxemia  R09.02 ECHOCARDIOGRAM COMPLETE    4. Dyspnea on exertion  R06.00 ECHOCARDIOGRAM COMPLETE    5. Immunosuppression due to drug therapy ([place])  D84.821    Z79.899     6. Hiatal hernia  K44.9        You have interstitial lung disease secondary to rheumatoid arthritis.  But he also have underlying hiatal hernia that could be contributing to the development and progression of pulmonary fibrosis  -Though reported as stable on his CT scan between 2020 and 2022 I actually think he might of had this for few to several years and it might be slowly progressive  Your chronic cough could be due to slowly worsening pulmonary fibrosis or rarely due to opportunistic infections from immunosuppressive therapy  Your shortness of breath is due to the interstitial lung disease [pulmonary fibrosis], weight, physical deconditioning, stiff heart muscle [seen in echo 2019] or potentially a condition called pulmonary hypertension that is related to interstitial lung disease  Plan - do ILD questionnaire  at home and bring it back - Check Joselyn Arrow on room air -Check 2D echocardiogram -Schedule bronchoscopy with lavage under anesthesia but after completing echo and overnight oxygen study  Followup  - We will call you with the date about the bronchoscopy but only after you have completed echocardiogram - 30 min fact to face n 6 weeks to discuss results   ( Level 05 visit: Estb 40-54 min   in  visit type: on-site physical face to visit  in total care time and counseling or/and coordination of care by this undersigned MD - Dr Brand Males. This includes one or more of the following on this same day  05/31/2021: pre-charting, chart review, note writing, documentation discussion of test results, diagnostic or treatment recommendations, prognosis, risks and benefits of management options, instructions, education, compliance or risk-factor reduction. It excludes time spent by the [place] or office staff in the care of the patient. Actual time 50 min)    SIGNATURE    Dr. Brand Males, M.D., F.C.C.P,  Pulmonary and Critical Care Medicine Staff Physician, [place] Director - Interstitial Lung Disease  Program  Pulmonary [place] at [place], [place], [address]  Pager: [phone], If no answer or between  15:00h - 7:00h: call [phone] Telephone: [phone]  5:43 PM 05/31/2021

## 2021-05-31 NOTE — Patient Instructions (Addendum)
ICD-10-CM   1. Interstitial lung disease due to connective tissue disease (HCC)  J84.89 Pulse oximetry, overnight   M35.9 ECHOCARDIOGRAM COMPLETE    2. Chronic cough  R05.3     3. Exercise hypoxemia  R09.02 ECHOCARDIOGRAM COMPLETE    4. Dyspnea on exertion  R06.00 ECHOCARDIOGRAM COMPLETE    5. Immunosuppression due to drug therapy ([place])  D84.821    Z79.899     6. Hiatal hernia  K44.9        You have interstitial lung disease secondary to rheumatoid arthritis.  But he also have underlying hiatal hernia that could be contributing to the development and progression of pulmonary fibrosis  -Though reported as stable on his CT scan between 2020 and 2022 I actually think he might of had this for few to several years and it might be slowly progressive  Your chronic cough could be due to slowly worsening pulmonary fibrosis or rarely due to opportunistic infections from immunosuppressive therapy  Your shortness of breath is due to the interstitial lung disease [pulmonary fibrosis], weight, physical deconditioning, stiff heart muscle [seen in echo 2019] or potentially a condition called pulmonary hypertension that is related to interstitial lung disease  Plan - do ILD questionnaire at home and bring it back - Check Joselyn Arrow on room air -Check 2D echocardiogram -Schedule bronchoscopy with lavage under anesthesia but after completing echo and overnight oxygen study  Followup  - We will call you with the date about the bronchoscopy but only after you have completed echocardiogram - 30 min fact to face n 6 weeks to discuss results

## 2021-05-31 NOTE — H&P (View-Only) (Signed)
IOV 05/18/21 - Dr Valeta Harms  Synopsis: Referred in July 2022 for cough and abnormal CT PCP: by Aura Dials, MD  Subjective:   PATIENT ID: Madison Holden GENDER: female DOB: 04-24-1946, MRN: [id number]  Chief Complaint  Patient presents with   Consult    Consult for chronic cough.     75 yo, HTN, arthritis, gout. C/o cough. Has been going on for several months. She went on a riverboat cruise this past year. Her cough is dry and non-productive. Cough is usually late at night or early in the morning.  From a respiratory standpoint is short of breath with exertion.  She has a dry nonproductive cough.  She has rheumatoid arthritis currently on Enbrel plus methotrexate.  She recently had a CT scan of the chest which revealed subpleural lower lobe reticulation and evidence of fibrosis.  She was sent here for evaluation of chronic cough and abnormal CT.    OV 05/31/2021 -transferred to Dr. Chase Caller in the ILD center  Subjective:  Patient ID: Madison Holden, female , DOB: [birth year] , age 27 y.o. , MRN: [id number] , ADDRESS: [address] [place] [id number] PCP Aura Dials, MD Patient Care Team: Aura Dials, MD as PCP - General (Family Medicine) Fay Records, MD as PCP - Cardiology (Cardiology) Orpah Melter, MD (Family Medicine)  This Provider for this visit: Treatment Team:  Attending Provider: Brand Males, MD    05/31/2021 -   Chief Complaint  Patient presents with   Follow-up    Pt saw Dr. Valeta Harms and is now being switched to Dr. Chase Caller. States she has had an occ cough that is worse in the mornings when she first wakes up.     HPI Danajia Goodner [facility] 75 y.o. -history is provided by the patient, her husband and also review of the medical records.  She sees Leafy Kindle at [facility] rheumatology.  She tells me that she has had a diagnosis of gout for few years.  Then approximately in early 2020 she had a diagnosis of rheumatoid arthritis given to  her.  She does have rheumatoid arthritis deformities in her hands.  Shortly after that she was started on methotrexate which she says also for her gout.  She was also then started on Enbrel.  She believes that in 2019 September [personally visualized and confirmed) she had a CT scan of the abdomen that on the lung images she had presence of ILD.  This was followed with a CT chest with contrast that is described with reticulation but no honeycombing.  Pulmonary consultation was recommended but because of the pandemic she never could get to it.  She says at baseline she just has very minimal shortness of breath but then she is obese and sedentary and therefore she not able to experience her shortness of breath.  Then approximately February 2022 started having chronic dry cough that is persistent since then.  She has been referred here for the cough.  Yesterday on 6-minute walk test she did desaturate to 87% but she says she does not normally exert that much.  She does not believe portable oxygen will help her.  She believes her current regimen of methotrexate and Enbrel is controlling her pain quite well.   In 2019 she did have a normal myocardial perfusion stress test. Pulmonary function test yesterday shows restriction with low diffusion. 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  Mostly sedentary.   PFT  PFT Results Latest Ref Rng & Units 05/24/2021  FVC-Pre L 1.57  FVC-Predicted Pre % 67  FVC-Post L 1.57  FVC-Predicted Post % 66  Pre FEV1/FVC % % 93  Post FEV1/FCV % % 94  FEV1-Pre L 1.47  FEV1-Predicted Pre % 83  FEV1-Post L 1.46  DLCO uncorrected ml/min/mmHg 7.67  DLCO UNC% % 47  DLCO corrected ml/min/mmHg 7.67  DLCO COR %Predicted % 47  DLVA Predicted % 65  TLC L 2.73  TLC % Predicted % 63  RV %  Predicted % 50   CT chest 04/25/21 FINDINGS: Cardiovascular: Atherosclerotic calcification of the aorta, aortic valve and coronary arteries. Pulmonic trunk and heart are enlarged. No pericardial effusion.   Mediastinum/Nodes: Subcentimeter low-attenuation lesion in the right thyroid. No follow-up recommended. (Ref: J Am Coll Radiol. 2015 Feb;12(2): 143-50).Mediastinal lymph nodes measure up to 1.3 cm in the low right paratracheal station, unchanged. Bihilar lymph nodes measure up to 1.3 cm on the right, also unchanged. No axillary adenopathy. Esophagus is grossly unremarkable. Moderate hiatal hernia.   Lungs/Pleura: Peripheral and somewhat basilar predominant subpleural reticulation, ground-glass and traction bronchiectasis/bronchiolectasis, as on 11/20/2018. No pleural fluid. Airway is unremarkable. IMPRESSION: 1. Pulmonary parenchymal pattern of fibrosis is unchanged from 11/20/2018 and most likely due to usual interstitial pneumonitis. Future evaluation utilizing high-resolution chest CT without contrast is suggested. 2. Chronically enlarged mediastinal and hilar lymph nodes, likely related interstitial lung disease. 3. Moderate hiatal hernia. 4. Aortic atherosclerosis (ICD10-I70.0). Coronary artery calcification. 5. Enlarged pulmonic trunk, indicative of pulmonary arterial hypertension.     Electronically Signed   By: Lorin Picket M.D.   On: 04/26/2021 07:56      has a past medical history of Ambulates with cane, Arthritis, Chest pain, unspecified (07-01-2018  per pt had 2 day chest pain and sob  6 weeks ago,, per pt no symptoms since), Diarrhea, unspecified, DOE (dyspnea on exertion), Endometrial cancer ([place]), Essential hypertension, Frequency of urination, GERD (gastroesophageal reflux disease), Gout (rheumologist-- michelle young PA ([facility])), Heart murmur, History of adenomatous polyp of colon, History of esophageal dilatation, Mild nausea, Mixed  dyslipidemia, Muscle weakness of extremity, Poor appetite, Type 2 diabetes mellitus ([place]), Urgency of urination, and Urine frequency.   reports that she has never smoked. She has never used smokeless tobacco.  Past Surgical History:  Procedure Laterality Date   BREAST CYST EXCISION Right 1974   benign per pt   CATARACT EXTRACTION W/ INTRAOCULAR LENS  IMPLANT, BILATERAL  2014;  2017   MOUTH SURGERY  11/2017   Upper quadrants,  per pt peridontist did laser procedure on gums   ROBOTIC ASSISTED TOTAL HYSTERECTOMY WITH BILATERAL SALPINGO OOPHERECTOMY Bilateral 07/22/2018   Procedure: XI ROBOTIC ASSISTED TOTAL HYSTERECTOMY WITH BILATERAL SALPINGO OOPHORECTOMY AND SENTINAL LYMPH NODE BIOPSY;  Surgeon: Everitt Amber, MD;  Location: [facility];  Service: Gynecology;  Laterality: Bilateral;   TONSILLECTOMY  child    Allergies  Allergen Reactions   Lovastatin    Rosuvastatin Other (See Comments)    Immunization History  Administered Date(s) Administered   Moderna Sars-Covid-2 Vaccination 11/26/2019, 12/22/2019, 08/12/2020   Pneumococcal Conjugate-13 01/29/2017   Pneumococcal Polysaccharide-23 08/01/2012   Tdap 01/17/2011   Zoster, Live 05/15/2012    Family History  Problem Relation Age of Onset   Heart disease Mother    Heart failure Mother  Heart disease Father    Prostate cancer Father    Clotting disorder Father    Diabetes Maternal Grandfather    Colon cancer Neg Hx      Current Outpatient Medications:    acetaminophen (TYLENOL) 650 MG CR tablet, Take 650 mg by mouth every 8 (eight) hours as needed for pain., Disp: , Rfl:    allopurinol (ZYLOPRIM) 300 MG tablet, Take 300 mg by mouth daily., Disp: , Rfl:    amLODipine (NORVASC) 2.5 MG tablet, Take 1 tablet by mouth once daily, Disp: 90 tablet, Rfl: 3   ASA-APAP-Caff Buffered (VANQUISH PO), Take by mouth. As needed, Disp: , Rfl:    B-D TB SYRINGE 1CC/27GX1/2" 27G X 1/2" 1 ML MISC, USE WEEKLY WITH METHOTREXATE SUBCUTANEOUSLY 90 DAYS,  Disp: , Rfl:    diphenhydrAMINE (BENADRYL) 25 MG tablet, Take 25 mg by mouth at bedtime as needed for sleep. , Disp: , Rfl:    esomeprazole (NEXIUM) 40 MG capsule, Take 40 mg by mouth 3 (three) times a week., Disp: , Rfl:    etanercept (ENBREL) 50 MG/ML injection, Inject 25 mg into the skin 2 (two) times a week., Disp: , Rfl:    Ferrous Gluconate 324 (37.5 Fe) MG TABS, Take 1 tablet by mouth 2 (two) times daily., Disp: , Rfl:    folic acid (FOLVITE) Q000111Q MCG tablet, Take 400 mcg by mouth daily., Disp: , Rfl:    hydrochlorothiazide (HYDRODIURIL) 25 MG tablet, Take 12.5 mg by mouth daily., Disp: , Rfl:    Magnesium 500 MG TABS, Take 500 mg by mouth every other day., Disp: , Rfl:    Melatonin 3 MG TABS, Take 3-6 mg by mouth at bedtime as needed (sleep). , Disp: , Rfl:    methotrexate 250 MG/10ML injection, once a week. 0.9, Disp: , Rfl:    oxybutynin (DITROPAN) 5 MG tablet, Take 5 mg by mouth daily. May take a second 5 mg dose as needed for bladder spasms, Disp: , Rfl:    pioglitazone (ACTOS) 30 MG tablet, Take 30 mg by mouth daily. , Disp: , Rfl:    Turmeric 500 MG CAPS, Take 1 capsule by mouth daily., Disp: , Rfl:    vitamin B-12 (CYANOCOBALAMIN) 1000 MCG tablet, Take 1,000 mcg by mouth daily., Disp: , Rfl:    vitamin C (ASCORBIC ACID) 500 MG tablet, Take 500 mg by mouth 4 (four) times a week., Disp: , Rfl:       Objective:   Vitals:   05/31/21 1647  BP: 126/78  Pulse: 79  Temp: 97.6 F (36.4 C)  TempSrc: Oral  SpO2: 96%  Weight: 188 lb (85.3 kg)  Height: '4\' 11"'$  (1.499 m)    Estimated body mass index is 37.97 kg/m as calculated from the following:   Height as of this encounter: '4\' 11"'$  (1.499 m).   Weight as of this encounter: 188 lb (85.3 kg).  '@WEIGHTCHANGE'$ @  [employer]   05/31/21 1647  Weight: 188 lb (85.3 kg)     Physical Exam  General Appearance:    Alert, cooperative, no distress, appears stated age - yes , Deconditioned looking - no , OBESE  - yes, Sitting on  Wheelchair -  no but ha s troller  Head:    Normocephalic, without obvious abnormality, atraumatic  Eyes:    PERRL, conjunctiva/corneas clear,  Ears:    Normal TM's and external ear canals, both ears  Nose:   Nares normal, septum midline, mucosa normal, no drainage  or sinus tenderness. OXYGEN ON  - no . Patient is @ ra   Throat:   Lips, mucosa, and tongue normal; teeth and gums normal. Cyanosis on lips - no  Neck:   Supple, symmetrical, trachea midline, no adenopathy;    thyroid:  no enlargement/tenderness/nodules; no carotid   bruit or JVD  Back:     Symmetric, no curvature, ROM normal, no CVA tenderness  Lungs:     Distress - no , Wheeze no, Barrell Chest - no, Purse lip breathing - no, Crackles - Classic Velcro crackles of UIP with a craniocaudal gradient particularly in the lung base  Chest Wall:    No tenderness or deformity.    Heart:    Regular rate and rhythm, S1 and S2 normal, no rub   or gallop, Murmur - no  Breast Exam:    NOT DONE  Abdomen:     Soft, non-tender, bowel sounds active all four quadrants,    no masses, no organomegaly. Visceral obesity - yes  Genitalia:   NOT DONE  Rectal:   NOT DONE  Extremities:   Extremities - normal, Has Cane - no, Clubbing - no,  Edema - no  Pulses:   2+ and symmetric all extremities  Skin:   Stigmata of Connective Tissue Disease - yes of RA  Lymph nodes:   Cervical, supraclavicular, and axillary nodes normal  Psychiatric:  Neurologic:   Pleasant - yes, Anxious - no, Flat affect - no  CAm-ICU - neg, Alert and Oriented x 3 - yes, Moves all 4s - yes, Speech - normal, Cognition - intact         Assessment:       ICD-10-CM   1. Interstitial lung disease due to connective tissue disease (HCC)  J84.89 Pulse oximetry, overnight   M35.9 ECHOCARDIOGRAM COMPLETE    2. Chronic cough  R05.3     3. Exercise hypoxemia  R09.02 ECHOCARDIOGRAM COMPLETE    4. 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 Hiatal hernia  K44.9          Plan:     Patient Instructions     ICD-10-CM   1. Interstitial lung disease due to connective tissue disease (HCC)  J84.89 Pulse oximetry, overnight   M35.9 ECHOCARDIOGRAM COMPLETE    2. Chronic cough  R05.3     3. Exercise hypoxemia  R09.02 ECHOCARDIOGRAM COMPLETE    4. Dyspnea on exertion  R06.00 ECHOCARDIOGRAM COMPLETE    5. Immunosuppression due to drug therapy ([place])  D84.821    Z79.899     6. Hiatal hernia  K44.9        You have interstitial lung disease secondary to rheumatoid arthritis.  But he also have underlying hiatal hernia that could be contributing to the development and progression of pulmonary fibrosis  -Though reported as stable on his CT scan between 2020 and 2022 I actually think he might of had this for few to several years and it might be slowly progressive  Your chronic cough could be due to slowly worsening pulmonary fibrosis or rarely due to opportunistic infections from immunosuppressive therapy  Your shortness of breath is due to the interstitial lung disease [pulmonary fibrosis], weight, physical deconditioning, stiff heart muscle [seen in echo 2019] or potentially a condition called pulmonary hypertension that is related to interstitial lung disease  Plan - do ILD questionnaire  at home and bring it back - Check Joselyn Arrow on room air -Check 2D echocardiogram -Schedule bronchoscopy with lavage under anesthesia but after completing echo and overnight oxygen study  Followup  - We will call you with the date about the bronchoscopy but only after you have completed echocardiogram - 30 min fact to face n 6 weeks to discuss results   ( Level 05 visit: Estb 40-54 min   in  visit type: on-site physical face to visit  in total care time and counseling or/and coordination of care by this undersigned MD - Dr Brand Males. This includes one or more of the following on this same day  05/31/2021: pre-charting, chart review, note writing, documentation discussion of test results, diagnostic or treatment recommendations, prognosis, risks and benefits of management options, instructions, education, compliance or risk-factor reduction. It excludes time spent by the [place] or office staff in the care of the patient. Actual time 50 min)    SIGNATURE    Dr. Brand Males, M.D., F.C.C.P,  Pulmonary and Critical Care Medicine Staff Physician, [place] Director - Interstitial Lung Disease  Program  Pulmonary [place] at [place], [place], [address]  Pager: [phone], If no answer or between  15:00h - 7:00h: call [phone] Telephone: [phone]  5:43 PM 05/31/2021

## 2021-06-02 ENCOUNTER — Ambulatory Visit: Payer: Medicare Other

## 2021-06-02 NOTE — Telephone Encounter (Signed)
Called and spoke with Maudie Mercury and scheduled Bronch at [facility] 06/27/21 at 12:30  Pt to arrive 2 hours prior   Called pt to give information about appt and covid testing and there was no answer, and no VM picked up. Will hold in procedure pool to be called again.

## 2021-06-06 DIAGNOSIS — R0683 Snoring: Secondary | ICD-10-CM | POA: Diagnosis not present

## 2021-06-06 DIAGNOSIS — G4730 Sleep apnea, unspecified: Secondary | ICD-10-CM | POA: Diagnosis not present

## 2021-06-06 NOTE — Telephone Encounter (Signed)
Called and spoke with patient. Let them know their Bronch is scheduled for 06/27/21 with Dr. Chase Caller at [facility].  Patient was instructed to arrive at hospital at 11am. They were instructed to bring someone with them as they will not be able to drive home from procedure. Patient instructed not to have anything to eat or drink after midnight. Patient needs to hold their blood thinner n/a , n/a hours / days prior to procedure.   Patient's covid screening is scheduled at [place] [address]. Its a drive up in the back of building stay in your car.  This is scheduled for 06/23/21 between 8am -11am.  Patient voiced understanding, nothing further needed  would like a copy for him to pick up at the front since they do not use their MyChart.  Copy printed and placed up front for pick up sometimes in the next  2 weeks.   Routing to Dr. Chase Caller  as Juluis Rainier

## 2021-06-20 ENCOUNTER — Ambulatory Visit (HOSPITAL_COMMUNITY): Payer: Medicare Other | Attending: Internal Medicine

## 2021-06-20 ENCOUNTER — Other Ambulatory Visit: Payer: Self-pay

## 2021-06-20 DIAGNOSIS — M359 Systemic involvement of connective tissue, unspecified: Secondary | ICD-10-CM | POA: Insufficient documentation

## 2021-06-20 DIAGNOSIS — R0902 Hypoxemia: Secondary | ICD-10-CM | POA: Diagnosis not present

## 2021-06-20 DIAGNOSIS — R0600 Dyspnea, unspecified: Secondary | ICD-10-CM | POA: Diagnosis not present

## 2021-06-20 DIAGNOSIS — J8489 Other specified interstitial pulmonary diseases: Secondary | ICD-10-CM | POA: Insufficient documentation

## 2021-06-20 DIAGNOSIS — R0609 Other forms of dyspnea: Secondary | ICD-10-CM

## 2021-06-20 LAB — ECHOCARDIOGRAM COMPLETE
Area-P 1/2: 3.16 cm2
S' Lateral: 2.2 cm

## 2021-06-20 NOTE — Progress Notes (Signed)
Attempted to obtain medical history via telephone, unable to reach at this time.  

## 2021-06-21 ENCOUNTER — Institutional Professional Consult (permissible substitution): Payer: Medicare Other | Admitting: Emergency Medicine

## 2021-06-23 ENCOUNTER — Other Ambulatory Visit: Payer: Self-pay | Admitting: Internal Medicine

## 2021-06-23 ENCOUNTER — Telehealth: Payer: Self-pay | Admitting: Internal Medicine

## 2021-06-23 LAB — SARS CORONAVIRUS 2 (TAT 6-24 HRS): SARS Coronavirus 2: NEGATIVE

## 2021-06-23 NOTE — Telephone Encounter (Signed)
Called and spoke with patient. She was calling to get instructions for her bronch. She did go get COVID tested this morning. Instructions provided to patient, she verbalized understanding.   Nothing further needed at time of call.

## 2021-06-23 NOTE — Telephone Encounter (Signed)
  Please let patient know echocardiogram is normal.  Overnight pulse ox study done on room air on 06/06/2021 shows pulse ox less than or equal to 88% for 1 hour and 7 minutes and 48 seconds.  Plan - Start 2 L nasal cannula oxygen - Okay to do bronchoscopy on 06/27/2021   Sonographer Comments: Technically difficult study due to poor echo windows  and patient is morbidly obese. Image acquisition challenging due to  patient body habitus.  IMPRESSIONS     1. Left ventricular ejection fraction, by estimation, is 60 to 65%. The  left ventricle has normal function. The left ventricle has no regional  wall motion abnormalities. There is moderate concentric left ventricular  hypertrophy. Left ventricular  diastolic parameters are indeterminate.   2. Right ventricular systolic function is normal. The right ventricular  size is normal.   3. The mitral valve is grossly normal. Trivial mitral valve  regurgitation.   4. The aortic valve is normal in structure. Aortic valve regurgitation is  not visualized. No aortic stenosis is present.

## 2021-06-27 ENCOUNTER — Ambulatory Visit (HOSPITAL_COMMUNITY): Payer: Medicare Other

## 2021-06-27 ENCOUNTER — Ambulatory Visit (HOSPITAL_COMMUNITY): Payer: Medicare Other | Admitting: Certified Registered Nurse Anesthetist

## 2021-06-27 ENCOUNTER — Ambulatory Visit (HOSPITAL_COMMUNITY)
Admission: RE | Admit: 2021-06-27 | Discharge: 2021-06-27 | Disposition: A | Discharge status: Home / Self Care | Payer: Medicare Other | Attending: Internal Medicine | Admitting: Internal Medicine

## 2021-06-27 ENCOUNTER — Encounter (HOSPITAL_COMMUNITY)
Admission: RE | Disposition: A | Discharge status: Home / Self Care | Payer: Self-pay | Source: Home / Self Care | Attending: Internal Medicine

## 2021-06-27 ENCOUNTER — Other Ambulatory Visit: Payer: Self-pay

## 2021-06-27 DIAGNOSIS — R0902 Hypoxemia: Secondary | ICD-10-CM | POA: Insufficient documentation

## 2021-06-27 DIAGNOSIS — R0600 Dyspnea, unspecified: Secondary | ICD-10-CM | POA: Diagnosis not present

## 2021-06-27 DIAGNOSIS — J189 Pneumonia, unspecified organism: Secondary | ICD-10-CM | POA: Diagnosis not present

## 2021-06-27 DIAGNOSIS — I10 Essential (primary) hypertension: Secondary | ICD-10-CM | POA: Insufficient documentation

## 2021-06-27 DIAGNOSIS — M069 Rheumatoid arthritis, unspecified: Secondary | ICD-10-CM | POA: Insufficient documentation

## 2021-06-27 DIAGNOSIS — R053 Chronic cough: Secondary | ICD-10-CM | POA: Insufficient documentation

## 2021-06-27 DIAGNOSIS — K449 Diaphragmatic hernia without obstruction or gangrene: Secondary | ICD-10-CM | POA: Insufficient documentation

## 2021-06-27 DIAGNOSIS — Z888 Allergy status to other drugs, medicaments and biological substances status: Secondary | ICD-10-CM | POA: Diagnosis not present

## 2021-06-27 DIAGNOSIS — M109 Gout, unspecified: Secondary | ICD-10-CM | POA: Diagnosis not present

## 2021-06-27 DIAGNOSIS — Z8542 Personal history of malignant neoplasm of other parts of uterus: Secondary | ICD-10-CM | POA: Diagnosis not present

## 2021-06-27 DIAGNOSIS — J849 Interstitial pulmonary disease, unspecified: Secondary | ICD-10-CM | POA: Diagnosis not present

## 2021-06-27 DIAGNOSIS — Z79899 Other long term (current) drug therapy: Secondary | ICD-10-CM | POA: Diagnosis not present

## 2021-06-27 DIAGNOSIS — Z9889 Other specified postprocedural states: Secondary | ICD-10-CM

## 2021-06-27 DIAGNOSIS — D84821 Immunodeficiency due to drugs: Secondary | ICD-10-CM | POA: Diagnosis not present

## 2021-06-27 DIAGNOSIS — D849 Immunodeficiency, unspecified: Secondary | ICD-10-CM | POA: Diagnosis not present

## 2021-06-27 DIAGNOSIS — E782 Mixed hyperlipidemia: Secondary | ICD-10-CM | POA: Diagnosis not present

## 2021-06-27 DIAGNOSIS — K219 Gastro-esophageal reflux disease without esophagitis: Secondary | ICD-10-CM | POA: Diagnosis not present

## 2021-06-27 HISTORY — PX: VIDEO BRONCHOSCOPY: SHX5072

## 2021-06-27 HISTORY — PX: BRONCHIAL WASHINGS: SHX5105

## 2021-06-27 LAB — BODY FLUID CELL COUNT WITH DIFFERENTIAL
Eos, Fluid: 11 %
Lymphs, Fluid: 0 %
Monocyte-Macrophage-Serous Fluid: 29 % — ABNORMAL LOW (ref 50–90)
Neutrophil Count, Fluid: 60 % — ABNORMAL HIGH (ref 0–25)
Total Nucleated Cell Count, Fluid: 147 cu mm (ref 0–1000)

## 2021-06-27 LAB — GLUCOSE, CAPILLARY: Glucose-Capillary: 116 mg/dL — ABNORMAL HIGH (ref 70–99)

## 2021-06-27 SURGERY — VIDEO BRONCHOSCOPY WITHOUT FLUORO
Anesthesia: General

## 2021-06-27 MED ORDER — SUGAMMADEX SODIUM 500 MG/5ML IV SOLN
INTRAVENOUS | Status: DC | PRN
  Administered 2021-06-27: 500 mg via INTRAVENOUS

## 2021-06-27 MED ORDER — PHENYLEPHRINE HCL 0.25 % NA SOLN: 1.0000 | Freq: Four times a day (QID) | NASAL | Status: DC | PRN

## 2021-06-27 MED ORDER — ONDANSETRON HCL 4 MG/2ML IJ SOLN
INTRAMUSCULAR | Status: DC | PRN
  Administered 2021-06-27: 4 mg via INTRAVENOUS

## 2021-06-27 MED ORDER — LIDOCAINE HCL 1 % IJ SOLN
INTRAMUSCULAR | Status: AC
  Filled 2021-06-27: qty 20

## 2021-06-27 MED ORDER — FENTANYL CITRATE (PF) 100 MCG/2ML IJ SOLN
INTRAMUSCULAR | Status: DC | PRN
  Administered 2021-06-27 (×2): 50 ug via INTRAVENOUS

## 2021-06-27 MED ORDER — FUROSEMIDE 10 MG/ML IJ SOLN
INTRAMUSCULAR | Status: AC
  Filled 2021-06-27: qty 4

## 2021-06-27 MED ORDER — LIDOCAINE 2% (20 MG/ML) 5 ML SYRINGE
INTRAMUSCULAR | Status: DC | PRN
  Administered 2021-06-27: 80 mg via INTRAVENOUS

## 2021-06-27 MED ORDER — ALBUTEROL SULFATE HFA 108 (90 BASE) MCG/ACT IN AERS
INHALATION_SPRAY | RESPIRATORY_TRACT | Status: DC | PRN
  Administered 2021-06-27 (×2): 2 via RESPIRATORY_TRACT

## 2021-06-27 MED ORDER — ACETAMINOPHEN 500 MG PO TABS
1000.0000 mg | ORAL_TABLET | Freq: Four times a day (QID) | ORAL | Status: DC | PRN
  Administered 2021-06-27: 1000 mg via ORAL

## 2021-06-27 MED ORDER — FUROSEMIDE 10 MG/ML IJ SOLN
20.0000 mg | Freq: Once | INTRAMUSCULAR | Status: AC
  Administered 2021-06-27: 20 mg via INTRAVENOUS

## 2021-06-27 MED ORDER — PROPOFOL 10 MG/ML IV BOLUS
INTRAVENOUS | Status: DC | PRN
  Administered 2021-06-27: 150 mg via INTRAVENOUS

## 2021-06-27 MED ORDER — LIDOCAINE HCL 4 % EX SOLN
Freq: Once | CUTANEOUS | Status: DC
  Filled 2021-06-27: qty 50

## 2021-06-27 MED ORDER — DEXAMETHASONE SODIUM PHOSPHATE 10 MG/ML IJ SOLN
INTRAMUSCULAR | Status: DC | PRN
  Administered 2021-06-27: 10 mg via INTRAVENOUS

## 2021-06-27 MED ORDER — ROCURONIUM BROMIDE 10 MG/ML (PF) SYRINGE
PREFILLED_SYRINGE | INTRAVENOUS | Status: DC | PRN
  Administered 2021-06-27: 50 mg via INTRAVENOUS

## 2021-06-27 MED ORDER — FENTANYL CITRATE (PF) 100 MCG/2ML IJ SOLN
INTRAMUSCULAR | Status: AC
  Filled 2021-06-27: qty 2

## 2021-06-27 MED ORDER — ACETAMINOPHEN 500 MG PO TABS
ORAL_TABLET | ORAL | Status: AC
  Filled 2021-06-27: qty 2

## 2021-06-27 MED ORDER — LACTATED RINGERS IV SOLN
INTRAVENOUS | Status: DC
  Administered 2021-06-27: 1000 mL via INTRAVENOUS

## 2021-06-27 MED ORDER — LIDOCAINE HCL URETHRAL/MUCOSAL 2 % EX GEL: 1.0000 "application " | Freq: Once | CUTANEOUS | Status: DC

## 2021-06-27 MED ORDER — SODIUM CHLORIDE 0.9 % IV SOLN: Freq: Once | INTRAVENOUS | Status: DC

## 2021-06-27 NOTE — Op Note (Signed)
Name:  Malia Corsi MRN:  [id number] DOB:  October 02, 1946  PROCEDURE NOTE  Procedure(s): Flexible bronchoscopy [phone]) Bronchial alveolar lavage [phone]) of the Right Lower Lobe   Indications:  ILD immunesupressed  Consent:  Procedure, benefits, risks and alternatives discussed.  Questions answered.  Consent obtained.  Anesthesia: General due to nocturjal desats, obesity and RA nec  Location: [place]  Procedure summary:  Appropriate equipment was assembled.  The patient was brought to the procedure suite room and identified as Madison Holden with 08-24-1946  Safety timeout was performed. The patient was placed supine on the  table, airway and GA given by CRNA. After the  GA was assured, flexible video bronchoscope was lubricated and inserted through the endotracheal tube Total of 0 mL of 1% Lidocaine were administered through the bronchoscope to augment moderate sedation  Airway examination was yes performed bilaterally to subsegmental level.  Minimal clear secretions were noted, mucosa appeared normal and no endobronchial lesions were identified.  Bronchial alveolar lavage of the RLL was performed  first with 23m fluid returned to cannister. Then with 40 mL of normal saline  x 3 number of times for total volume of 120 mL. Note Total return of 35 mL of grey fluid fluid, after which the bronchoscope was withdrawn.    After hemostasis was assure, the bronchoscope was withdrawn.  The patient was recovered and then  transferred to recovery area  Post-procedure chest x-ray was not ordered.  Specimens sent: Bronchial alveolar lavage specimen of the RLL for cell count , microbiology and cytology.  Complications:  No immediate complications were noted.  Hemodynamic parameters and oxygenation remained stable throughout the procedure.  Estimated blood loss:  none  IMPRESSION 1. Normal airway 2. RLL BAL 3. No biopsy  Followup Future Appointments  Date Time Provider  DRoy 07/19/2021  1:45 PM RBrand Males MD [facility]UL[facility] None  03/30/2022 12:30 PM MHayden Pedro MD T[facility]-TRE None     Dr. MBrand Males M.D., FOpticare Eye Health Centers IncC.P Pulmonary and Critical Care Medicine Staff Physician CCumberland CityPulmonary and Critical Care Pager: [phone] If no answer or between  15:00h - 7:00h: call [phone]  06/27/2021 12:44 PM

## 2021-06-27 NOTE — Anesthesia Procedure Notes (Signed)
Procedure Name: Intubation Date/Time: 06/27/2021 12:28 PM Performed by: Gerald Leitz, CRNA Pre-anesthesia Checklist: Patient identified, Patient being monitored, Timeout performed, Emergency Drugs available and Suction available Patient Re-evaluated:Patient Re-evaluated prior to induction Oxygen Delivery Method: Circle system utilized Preoxygenation: Pre-oxygenation with 100% oxygen Induction Type: IV induction Ventilation: Mask ventilation without difficulty Laryngoscope Size: Mac and 3 Grade View: Grade I Tube type: Oral Tube size: 8.5 mm Number of attempts: 1 Airway Equipment and Method: Stylet Placement Confirmation: ETT inserted through vocal cords under direct vision, positive ETCO2 and breath sounds checked- equal and bilateral Secured at: 21 cm Tube secured with: Tape Dental Injury: Teeth and Oropharynx as per pre-operative assessment

## 2021-06-27 NOTE — Discharge Instructions (Signed)
BRONCHOSCOPY DISCHARGE INSTRUCTIONS  - Please have someone to drive you home - Please be careful with activities for next 24 hours - You can eat 2-4 hours after getting home provided you are fully alert, able to cough, and are not nauseated or vomiting and     feel well - You are expected to have low grade fever or cough some amount of blood for next 24-48 hours; if this worsens call us - If you are very short of breath or coughing blood or chest pain or not feeling well, call us [phone] anytime or go to emergency room - For followup appointment - see below. If not there please call [phone] to make an appointment with Dr Chase Caller or nurse practitioner   Future Appointments  Date Time Provider [place]  07/19/2021  1:45 PM Brand Males, MD [facility] None  03/30/2022 12:30 PM Hayden Pedro, MD T[facility]TRE None

## 2021-06-27 NOTE — Telephone Encounter (Signed)
Attempted to call pt but unable to reach. Left message for her to return call. 

## 2021-06-27 NOTE — Interval H&P Note (Signed)
History and Physical Interval Note:  06/27/2021 11:34 AM  Madison Holden  has presented today for surgery, with the diagnosis of [place].  The various methods of treatment have been discussed with the patient and family. After consideration of risks, benefits and other options for treatment, the patient has consented to  Procedure(s): VIDEO BRONCHOSCOPY WITHOUT FLUORO (N/A) as a surgical intervention.  The patient's history has been reviewed, patient examined, no change in status, stable for surgery.  I have reviewed the patient's chart and labs.  Questions were answered to the patient's satisfaction.     Given nocturnal desats and obesity will go with anesthesia support.   Risks of pneumothorax, hemothorax, sedation/anesthesia complications such as cardiac or respiratory arrest or hypotension, stroke and bleeding all explained. Benefits of diagnosis but limitations of non-diagnosis also explained. Patient verbalized understanding and wished to proceed.      SIGNATURE    Dr. Brand Males, M.D., F.C.C.P,  Pulmonary and Critical Care Medicine Staff Physician, [facility] Director - Interstitial Lung Disease  Program  Pulmonary [place] at [place], [place], [address]  NPI Number:  NPI [id number]  Pager: [phone], If no answer  -> Check AMION or Try [phone] Telephone (clinical office): [phone] Telephone (research): [phone]  11:35 AM 06/27/2021

## 2021-06-27 NOTE — Progress Notes (Signed)
Colletta [place], [place] assessed the pt at the bedside. Per PA, Dr Chase Caller is comfortable discharging the pt at this time with home oxygen orders to be delivered tomorrow. Pt meets all endoscopy discharge parameters. Pt being discharged as ordered at this time.

## 2021-06-27 NOTE — Plan of Care (Addendum)
PCCM Interval Progress Note  Asked to assess Madison Holden in Endo suite regarding oxygenation status.  On bedside evaluation, Madison Holden was up to the chair at bedside with husband. She appeared comfortable with very mild conversational dyspnea, but did appear slightly anxious. She reported that she had a headache and some neck discomfort (baseline for her) and that she had not taken any of her medications for pain today (Tylenol Arthritis, baclofen). Offered patient Tylenol for her head in the meantime.  Vitals were notable for normal HR/BP with variable SpO2 ranging from low 80s to mid 90s during my visit with the patient. Discussed options with patient including overnight observation and discharge home with home oxygen therapy. Patient was adamant that she did not want to remain in the hospital overnight, as she has an appointment at 0900 tomorrow that she cannot miss.  Patient was initially averse to home oxygen therapy, but decided she would consider it with some time to think about it. She/husband were ok with me placing a referral for home O2 therapy at this time with the option to cancel if they change their minds. A referral for home O2 therapy and a follow-up appointment with PCCM will be placed for this week (07/04/2021 at 10:30AM with Rexene Edison, NP).  Ultimately, patient felt well at the time of discharge and she and husband verbalized understanding of the plan of care. Advised patient to call if any change in symptoms was noted (including but not limited to chest pain, SOB, difficulty breathing, LE edema/swelling, fever/chills, coughing up blood).  Lestine Mount, PA-C [facility] 06/27/21 4:48 PM

## 2021-06-27 NOTE — Progress Notes (Signed)
Pt arrived to the GI Recovery area after bronchoscopy with fits of strong unproductive coughs. In the immediate post-operative period, the pt's o2 saturation was 95% on 6 LPM by simple mask. Pt's blood pressure was 161/74. Dr Chase Caller assessed the pt at the bedside and ordered a STAT portable chest xray. Pt then desaturated and required 15 LPM by NRB in order to maintain an O2 saturation above 92%. MD Chase Caller was notified and ordered 20 mg furosemide IV to be given before discharge. An attempt was made to wean the pt to 10 LPM NRB but the pt desatted. After approximately 6 minutes, another attempt was made to wean the pt's oxygen. The pt tolerated weaning down to 2 LPM by [place]. At approximately 1400, Dr Chase Caller was notified of patient's oxygenation status. At approximately 1430, an attempt was made to wean the pt to room air. The pt's oxygen saturation was 87-88 at rest and 84-85 when talking. The pt's pressure had improved to 132/59. Dr Chase Caller was notified of this and stated he was still comfortable with the furosemide being given and set the oxygen goal for discharge at 88% with activity on room air. At 1445, the pt was ambulated to the restroom. The ambulation was accompanied by a rise in blood pressure to 163/65 and a drop in oxygen saturation to 66%. The oxygen saturation was able to be corrected with 2 LPM [place] to 95%. Furosemide was given as ordered at 1450. Pt's O2 saturation improved to 98% on 2 LPM [place]. Supplemental oxygen was titrated down to 1 LPM at 1500. Pt's oxygen was titrated to room air at approximately 1530. Pt's saturations are currently 92-94% when deep breathing on RA, 87-89% when breathing normally on room air as of 1545.  Debarah Crape, BSN, RN

## 2021-06-27 NOTE — Progress Notes (Signed)
MD Ramaswamy notified of pt's oxygenation status. MD stated he will send Colletta Maryland, NP to assess.

## 2021-06-27 NOTE — Progress Notes (Signed)
Call from endo (husban was updated post proceure) 1:25 PM   Unable to get patient off NRB CXR - no ptx (official report pending) to my visualization She is on HCTZ at home d- did not take today Pre procedure pulse o x95 % on RA She is also coughing She did have nicturnal desats (pre procedure 06/27/2021 she refused home o2) SBP 160 per RN  Plan - turn IV fluids off  - lasix '20mg'$  IV x 1 - monitor in recovery  - not ready for discharge     SIGNATURE    Dr. Brand Males, M.D., F.C.C.P,  Pulmonary and Critical Care Medicine Staff Physician, [place] Director - Interstitial Lung Disease  Program  Pulmonary [place] at [place], [place], [address]  NPI Number:  NPI [id number]  Pager: [phone], If no answer  -> Check AMION or Try [phone] Telephone (clinical office): [phone] Telephone (research): [phone]  1:27 PM 06/27/2021

## 2021-06-27 NOTE — Transfer of Care (Signed)
Immediate Anesthesia Transfer of Care Note  Patient: Madison Holden  Procedure(s) Performed: Procedure(s): VIDEO BRONCHOSCOPY WITHOUT FLUORO (N/A) BRONCHIAL WASHINGS  Patient Location: PACU  Anesthesia Type:General  Level of Consciousness: Alert, Awake, Oriented  Airway & Oxygen Therapy: Patient Spontanous Breathing  Post-op Assessment: Report given to RN  Post vital signs: Reviewed and stable  Last Vitals:  Vitals:   06/27/21 1143  BP: (!) 167/85  Pulse: (!) 110  Resp: (!) 23  Temp: 36.9 C  SpO2: 99991111    Complications: No apparent anesthesia complications

## 2021-06-27 NOTE — Anesthesia Preprocedure Evaluation (Addendum)
Anesthesia Evaluation  Patient identified by MRN, date of birth, ID band Patient awake    Reviewed: Allergy & Precautions, NPO status , Patient's Chart, lab work & pertinent test results  History of Anesthesia Complications Negative for: history of anesthetic complications  Airway Mallampati: II  TM Distance: >3 FB Neck ROM: Full    Dental  (+) Teeth Intact, Dental Advisory Given   Pulmonary  Chronic cough ILD   Pulmonary exam normal        Cardiovascular hypertension, Normal cardiovascular exam  Echo 06/20/21: E 60-65%, no RWMA, mod LVH, normal RV fn, normal MV/AV   Neuro/Psych    GI/Hepatic Neg liver ROS, GERD  ,  Endo/Other  diabetes, Type 2Morbid obesity  Renal/GU negative Renal ROS  negative genitourinary   Musculoskeletal  (+) Arthritis , Rheumatoid disorders,    Abdominal   Peds  Hematology negative hematology ROS (+)   Anesthesia Other Findings   Reproductive/Obstetrics                          Anesthesia Physical Anesthesia Plan  ASA: 3  Anesthesia Plan: General   Post-op Pain Management:    Induction: Intravenous  PONV Risk Score and Plan: 3 and Ondansetron, Dexamethasone, Treatment may vary due to age or medical condition and Midazolam  Airway Management Planned: Oral ETT  Additional Equipment: None  Intra-op Plan:   Post-operative Plan: Extubation in OR  Informed Consent: I have reviewed the patients History and Physical, chart, labs and discussed the procedure including the risks, benefits and alternatives for the proposed anesthesia with the patient or authorized representative who has indicated his/her understanding and acceptance.     Dental advisory given  Plan Discussed with:   Anesthesia Plan Comments:         Anesthesia Quick Evaluation

## 2021-06-28 ENCOUNTER — Encounter (HOSPITAL_COMMUNITY): Payer: Self-pay | Admitting: Internal Medicine

## 2021-06-28 ENCOUNTER — Telehealth: Payer: Self-pay | Admitting: Internal Medicine

## 2021-06-28 LAB — CYTOLOGY - NON PAP

## 2021-06-28 LAB — PNEUMOCYSTIS JIROVECI SMEAR BY DFA: Pneumocystis jiroveci Ag: NEGATIVE

## 2021-06-28 NOTE — Anesthesia Postprocedure Evaluation (Signed)
Anesthesia Post Note  Patient: Madison Holden [facility]  Procedure(s) Performed: [place] WITHOUT FLUORO BRONCHIAL WASHINGS     Patient location during evaluation: Endoscopy Anesthesia Type: MAC Level of consciousness: awake and alert Pain management: pain level controlled Vital Signs Assessment: post-procedure vital signs reviewed and stable Respiratory status: spontaneous breathing, nonlabored ventilation, respiratory function stable and patient connected to nasal cannula oxygen Cardiovascular status: blood pressure returned to baseline and stable Postop Assessment: no apparent nausea or vomiting Anesthetic complications: no   No notable events documented.  Last Vitals:  Vitals:   06/27/21 1400   BP: 129/56   Pulse: 95   Resp: 24   Temp:    SpO2: 95%     Last Pain:  Vitals:   06/27/21 1640  TempSrc:   PainSc: 0-No pain   Pain Goal:                   [name]

## 2021-06-28 NOTE — Telephone Encounter (Signed)
Will forward to EP to follow up on ILD paperwork for MR.

## 2021-06-28 NOTE — Telephone Encounter (Signed)
Pt returning missed call. States she'll be out all day and won't be able to answer the phone  but asked that a detailed vm be left on answering machine.

## 2021-06-28 NOTE — Telephone Encounter (Signed)
Will give MR the ILD packet when he is back in clinic Tues. 9/13

## 2021-06-28 NOTE — Telephone Encounter (Signed)
Pt's husband dropped off ILD paperwork and the forms were placed in Dr. Chase Caller box. Pls regard; [phone].

## 2021-06-29 LAB — MTB RIF NAA W/O CULTURE, SPUTUM

## 2021-06-29 LAB — CULTURE, RESPIRATORY W GRAM STAIN
Culture: NO GROWTH
Gram Stain: NONE SEEN

## 2021-06-29 NOTE — Telephone Encounter (Signed)
Called patient. She did not answer. I attempted to leave a VM but it cut off. Will attempt to call her later.

## 2021-06-30 NOTE — Telephone Encounter (Signed)
Called and spoke with pt letting her know the results of echo and ONO and stated to her that the ONO does qualify her for O2.  Pt said that she wants to hold off on O2 at this time. Nothing further needed.

## 2021-07-03 LAB — MTB RIF NAA NON-SPUTUM, W/O CULTURE

## 2021-07-04 ENCOUNTER — Ambulatory Visit: Payer: Medicare Other | Admitting: Adult Health

## 2021-07-04 LAB — ACID FAST SMEAR (AFB, MYCOBACTERIA): Acid Fast Smear: NEGATIVE

## 2021-07-19 ENCOUNTER — Encounter: Payer: Self-pay | Admitting: Internal Medicine

## 2021-07-19 ENCOUNTER — Other Ambulatory Visit: Payer: Self-pay

## 2021-07-19 ENCOUNTER — Ambulatory Visit: Payer: Medicare Other | Admitting: Internal Medicine

## 2021-07-19 VITALS — BP 120/72 | HR 84 | Temp 97.6°F | Ht <= 58 in | Wt 189.6 lb

## 2021-07-19 DIAGNOSIS — K449 Diaphragmatic hernia without obstruction or gangrene: Secondary | ICD-10-CM

## 2021-07-19 DIAGNOSIS — R0600 Dyspnea, unspecified: Secondary | ICD-10-CM

## 2021-07-19 DIAGNOSIS — R0902 Hypoxemia: Secondary | ICD-10-CM

## 2021-07-19 DIAGNOSIS — R053 Chronic cough: Secondary | ICD-10-CM

## 2021-07-19 DIAGNOSIS — H35033 Hypertensive retinopathy, bilateral: Secondary | ICD-10-CM | POA: Diagnosis not present

## 2021-07-19 DIAGNOSIS — J8289 Other pulmonary eosinophilia, not elsewhere classified: Secondary | ICD-10-CM

## 2021-07-19 DIAGNOSIS — I10 Essential (primary) hypertension: Secondary | ICD-10-CM | POA: Diagnosis not present

## 2021-07-19 DIAGNOSIS — G4734 Idiopathic sleep related nonobstructive alveolar hypoventilation: Secondary | ICD-10-CM

## 2021-07-19 DIAGNOSIS — J8489 Other specified interstitial pulmonary diseases: Secondary | ICD-10-CM

## 2021-07-19 DIAGNOSIS — R0609 Other forms of dyspnea: Secondary | ICD-10-CM

## 2021-07-19 DIAGNOSIS — E11319 Type 2 diabetes mellitus with unspecified diabetic retinopathy without macular edema: Secondary | ICD-10-CM | POA: Diagnosis not present

## 2021-07-19 DIAGNOSIS — M359 Systemic involvement of connective tissue, unspecified: Secondary | ICD-10-CM

## 2021-07-19 DIAGNOSIS — Z23 Encounter for immunization: Secondary | ICD-10-CM | POA: Diagnosis not present

## 2021-07-19 MED ORDER — ALVESCO 80 MCG/ACT IN AERS: 1.0000 | INHALATION_SPRAY | Freq: Two times a day (BID) | RESPIRATORY_TRACT | 0 refills | Status: DC

## 2021-07-19 NOTE — Patient Instructions (Addendum)
ICD-10-CM   1. Interstitial lung disease due to connective tissue disease ([place])  J84.89    M35.9     2. Exercise hypoxemia  R09.02     3. 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 Hiatal hernia  K44.9     5. 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 Dyspnea on exertion  R06.00     7. Chronic cough  R05.3       Under bronchoscopy with lavage in September 2022 there is no evidence of infection but there is mild pulmonary eosinophilia which means your interstitial lung disease/pulmonary fibrosis is either from rheumatoid arthritis but could also be from medications such as allopurinol, rarely methotrexate or hydrochlorothiazide  Overall I think interstitial lung disease is not from the medications but from rheumatoid arthritis  I think the mild pulmonary eosinophilia [from RA] and the hiatal hernia is making you cough  Your shortness of breath itself is from the pulmonary fibrosis but also from other recent surgeries weight, physical deconditioning and stiff heart muscle  The good news is there is no evidence of infection   Plan -Recommend overnight oxygen and exertional oxygen but respect your desire not to do that - Recommend sleep study evaluation but respect your desire not to do that - Recommend continued observation of interstitial lung disease -and antifibrotic's if you show evidence of decline -Try oral inhaler [place] 61mcg dose -1 puff twice daily to see if this will help your cough through indirect control of eosinophilia -No changes in your baseline medications but we will continue to watch your situation  Followup  -Do spirometry and DLCO in 3-4 months - Return to see Dr. Chase Caller in 3-4 months for 30-minute visit [ILD symptom questionnaire and walking desaturation test at follow-up]

## 2021-07-19 NOTE — Progress Notes (Signed)
IOV 05/18/21 - Dr Valeta Harms  Synopsis: Referred in July 2022 for cough and abnormal CT PCP: by Aura Dials, MD  Subjective:   PATIENT ID: Madison Holden GENDER: female DOB: [birth year], MRN: [id number]  Chief Complaint  Patient presents with   Consult    Consult for chronic cough.     75 yo, HTN, arthritis, gout. C/o cough. Has been going on for several months. She went on a riverboat cruise this past year. Her cough is dry and non-productive. Cough is usually late at night or early in the morning.  From a respiratory standpoint is short of breath with exertion.  She has a dry nonproductive cough.  She has rheumatoid arthritis currently on Enbrel plus methotrexate.  She recently had a CT scan of the chest which revealed subpleural lower lobe reticulation and evidence of fibrosis.  She was sent here for evaluation of chronic cough and abnormal CT.    OV 05/31/2021 -transferred to Dr. Chase Caller in the ILD center  Subjective:  Patient ID: Madison Holden, female , DOB: 17-May-1946 , age 75 y.o. , MRN: [id number] , ADDRESS: [address] [place] [id number] PCP Aura Dials, MD Patient Care Team: Aura Dials, MD as PCP - General (Family Medicine) Fay Records, MD as PCP - Cardiology (Cardiology) Orpah Melter, MD (Family Medicine)  This Provider for this visit: Treatment Team:  Attending Provider: Brand Males, MD    05/31/2021 -   Chief Complaint  Patient presents with   Follow-up    Pt saw Dr. Valeta Harms and is now being switched to Dr. Chase Caller. States she has had an occ cough that is worse in the mornings when she first wakes up.     HPI Madison Holden [facility] 75 y.o. -history is provided by the patient, her husband and also review of the medical records.  She sees Leafy Kindle at [facility] rheumatology.  She tells me that she has had a diagnosis of gout for few years.  Then approximately in early 2020 she had a diagnosis of rheumatoid arthritis given  to her.  She does have rheumatoid arthritis deformities in her hands.  Shortly after that she was started on methotrexate which she says also for her gout.  She was also then started on Enbrel.  She believes that in 2019 September [personally visualized and confirmed) she had a CT scan of the abdomen that on the lung images she had presence of ILD.  This was followed with a CT chest with contrast that is described with reticulation but no honeycombing.  Pulmonary consultation was recommended but because of the pandemic she never could get to it.  She says at baseline she just has very minimal shortness of breath but then she is obese and sedentary and therefore she not able to experience her shortness of breath.  Then approximately February 2022 started having chronic dry cough that is persistent since then.  She has been referred here for the cough.  Yesterday on 6-minute walk test she did desaturate to 87% but she says she does not normally exert that much.  She does not believe portable oxygen will help her.  She believes her current regimen of methotrexate and Enbrel is controlling her pain quite well.   In 2019 she did have a normal myocardial perfusion stress test. Pulmonary function test yesterday shows restriction with low diffusion. 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  Mostly sedentary. Uses walker    CT chest 04/25/21 FINDINGS: Cardiovascular: Atherosclerotic calcification of the aorta, aortic valve and coronary arteries. Pulmonic trunk and heart are enlarged. No pericardial effusion.   Mediastinum/Nodes: Subcentimeter low-attenuation lesion in the right thyroid. No follow-up recommended. (Ref: J Am Coll Radiol. 2015 Feb;12(2): 143-50).Mediastinal lymph nodes measure up to 1.3 cm in the low right  paratracheal station, unchanged. Bihilar lymph nodes measure up to 1.3 cm on the right, also unchanged. No axillary adenopathy. Esophagus is grossly unremarkable. Moderate hiatal hernia.   Lungs/Pleura: Peripheral and somewhat basilar predominant subpleural reticulation, ground-glass and traction bronchiectasis/bronchiolectasis, as on 11/20/2018. No pleural fluid. Airway is unremarkable. IMPRESSION: 1. Pulmonary parenchymal pattern of fibrosis is unchanged from 11/20/2018 and most likely due to usual interstitial pneumonitis. Future evaluation utilizing high-resolution chest CT without contrast is suggested. 2. Chronically enlarged mediastinal and hilar lymph nodes, likely related interstitial lung disease. 3. Moderate hiatal hernia. 4. Aortic atherosclerosis (ICD10-I70.0). Coronary artery calcification. 5. Enlarged pulmonic trunk, indicative of pulmonary arterial hypertension.     Electronically Signed   By: Lorin Picket M.D.   On: 04/26/2021 07:56      OV 07/19/2021  Subjective:  Patient ID: Madison Holden, female , DOB: Sep 29, 1946 , age 75 y.o. , MRN: [id number] , ADDRESS: [address] [place] [phone] PCP Aura Dials, MD Patient Care Team: Aura Dials, MD as PCP - General (Family Medicine) Fay Records, MD as PCP - Cardiology (Cardiology) Orpah Melter, MD (Family Medicine)  This Provider for this visit: Treatment Team:  Attending Provider: Brand Males, MD    07/19/2021 -   Chief Complaint  Patient presents with   Follow-up    Pt is following up today after recent bronch.  Pt states she has been doing okay and denies any complaints.   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  -Associated with connective tissue disease rheumatoid arthritis  -Associated with hiatal hernia moderate  -Associated feather pillow use at home September 2022 [no lymphocytes in lavage September 2022]  -Unchanged January 2020 -> July  2022  -Bronchoscopy with lavage September 2022   -11% eosinophils, 60% neutrophils, no lymphocytes and culture negative   -September 2022: Medications associated with eosinophilia include methotrexate, allopurinol, hydrochlorothiazide  Abnormal 6-minute walk test with hypoxemia and nocturnal desaturation-summer 2022  -Refuses oxygen therapy and sleep apnea evaluation   Obesity with sedentary lifestyle using cane and walker  Possible diastolic dysfunction on echocardiogram summer 2022  HPI Madison Holden [facility] 75 y.o. -returns for follow-up to discuss bronchoalveolar lavage results and also ILD questionnaire..  ILD questionnaire information is elicited below.    [facility] Integrated Comprehensive ILD Questionnaire  Symptoms:  -Insidious onset of symptoms since it started around the same shortness of breath for 3 years.  No episodes present with exertion relieved by rest.  Although the cough started in February 2022.  Since it started its the same.  Mostly clear sometimes has yellow in it.  Never had hemoptysis.  It does affect her voice.  She does clear her throat.  Cough is sometimes raspy.  Symptom severity score is listed below.  SYMPTOM SCALE - ILD 07/19/2021  Current weight   O2 use ra  Shortness of Breath 0 -> 5 scale with 5 being worst (score 6 If unable to do)  At rest 0  Simple tasks -  showers, clothes change, eating, shaving 0  Household (dishes, doing bed, laundry) 1  Shopping 0  Walking level at own pace 1  Walking up Stairs 3  Total (30-36) Dyspnea Score 5  How bad is your cough? 3  How bad is your fatigue 3  How bad is nausea 0  How bad is vomiting?  0  How bad is diarrhea? 0  How bad is anxiety? 1  How bad is depression 0  Any chronic pain - if so where and how bad Yes from RAM in neck       Past Medical History :  -Significant for rheumatoid arthritis for several years.  Diagnosed was in 2020.  Is on methotrexate and Enbrel.  She does not want to change  this ideally because pain control is pretty good.  No other connective tissue disease. -She does have nocturnal desaturations diagnosed summer 2022.  She does not want to wear oxygen.  She does not want to have sleep test -She has exertional hypoxemia diagnosed, 2022.  Does not want to use oxygen. -Has type 2 diabetes under control with diet -Has hypertension -on hydrochlorothiazide and also calcium channel blocker.  Previously on ACE inhibitor -Has gout on allopurinol -Moderate hiatal hernia on CT scan -Denies PE or heart disease or stroke -Has had COVID-vaccine but has never had COVID   ROS:   -Positive for fatigue arthritis occasional dysphagia.  She has had 80 pound weight loss in 2019 and 10 months but none since then.  No acid reflux although she does have hiatal hernia  FAMILY HISTORY of LUNG DISEASE:  Denies  PERSONAL EXPOSURE HISTORY:   -Never smoked cigarettes or cigars.  Never smoked marijuana.  Never smoked cocaine.  No intravenous drug use.  HOME  EXPOSURE and HOBBY DETAILS :   [place] age of the current home is 41 years.  The bathroom did have a little mold in the ceiling but they treated it.  She had some mold in the floor but got replaced.  She does use a feather pillow for many years.  She had a sitter cleaned 20 years ago but there is no mold in it.  OCCUPATIONAL HISTORY (122 questions) :  -Other than using for the pillow she has no organic antigen exposure history -Inorganic antigen exposure history at work is negative  PULMONARY TOXICITY HISTORY (27 items):  -Positive for methotrexate in 2020.  She took prednisone briefly between 2019 and 2020.  She is on Enbrel since 2020.  This controls the pain well. -Pulmonary eosinophilia medications that she is on include allopurinol, hydrochlorothiazide and methotrexate  INVESTIGATIONS: 0 abnormal 6-minute walk test in summer 2022 -refused portable oxygen Abnormal overnight  desaturation test summer 2022-refused nighttime oxygen and sleep study evaluation Bronchoscopy with lavage below  High-resolution CT chest July 2022 probable UIP without change    Her bronchoscopy showed neutrophils 11% eosinophils.  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  Differential diagnosis for this includes medication such as methotrexate, allopurinol and hydrochlorothiazide which she is on.  She is not on any ACE inhibitor or cancer drugs or sulfa drugs or NSAIDs which are also known to cause eosinophilia.  The mixed cellularity in the absence of infection and the mild eosinophilia suggests interstitial lung disease from connective tissue disease as the cause   Results for Madison Holden, Madison Holden (MRN [id number]) as of 07/19/2021 13:23  Ref. Range 06/27/2021 13:31  Monocyte-Macrophage-Serous Fluid Latest Ref Range: 50 -  90 % 29 (L)  Other Cells, Fluid Latest Units: % CORRELATE WITH CYTOLOGY.  Color, Fluid Latest Ref Range: YELLOW  COLORLESS (A)  Total Nucleated Cell Count, Fluid Latest Ref Range: 0 - 1,000 cu mm 147  Fluid Type-FCT Unknown Bronch Lavag  Lymphs, Fluid Latest Units: % 0  Eos, Fluid Latest Units: % 11  Appearance, Fluid Latest Ref Range: CLEAR  HAZY (A)  Neutrophil Count, Fluid Latest Ref Range: 0 - 25 % 60 (H)   Overnight pulse ox study done on room air on 06/06/2021 shows pulse ox less than or equal to 88% for 1 hour and 7 minutes and 48 seconds. Start 2 L nasal cannula oxygen   IMPRESSIONS  ECHO sept 2022    1. Left ventricular ejection fraction, by estimation, is 60 to 65%. The  left ventricle has normal function. The left ventricle has no regional  wall motion abnormalities. There is moderate concentric left ventricular  hypertrophy. Left ventricular  diastolic parameters are indeterminate.   2. Right ventricular systolic function is normal. The right ventricular  size is normal.   3. The mitral valve is grossly normal. Trivial mitral valve  regurgitation.    4. The aortic valve is normal in structure. Aortic valve regurgitation is  not visualized. No aortic stenosis is present   PFT  PFT Results Latest Ref Rng & Units 05/24/2021  FVC-Pre L 1.57  FVC-Predicted Pre % 67  FVC-Post L 1.57  FVC-Predicted Post % 66  Pre FEV1/FVC % % 93  Post FEV1/FCV % % 94  FEV1-Pre L 1.47  FEV1-Predicted Pre % 83  FEV1-Post L 1.46  DLCO uncorrected ml/min/mmHg 7.67  DLCO UNC% % 47  DLCO corrected ml/min/mmHg 7.67  DLCO COR %Predicted % 47  DLVA Predicted % 65  TLC L 2.73  TLC % Predicted % 63  RV % Predicted % 50       has a past medical history of Ambulates with cane, Arthritis, Chest pain, unspecified (07-01-2018  per pt had 2 day chest pain and sob  6 weeks ago,, per pt no symptoms since), Diarrhea, unspecified, DOE (dyspnea on exertion), Endometrial cancer ([place]), Essential hypertension, Frequency of urination, GERD (gastroesophageal reflux disease), Gout (rheumologist-- michelle young PA ([place] [facility])), Heart murmur, History of adenomatous polyp of colon, History of esophageal dilatation, Mild nausea, Mixed dyslipidemia, Muscle weakness of extremity, Poor appetite, Type 2 diabetes mellitus ([place]), Urgency of urination, and Urine frequency.   reports that she has never smoked. She has never used smokeless tobacco.  Past Surgical History:  Procedure Laterality Date   BREAST CYST EXCISION Right 1974   benign per pt   BRONCHIAL WASHINGS  06/27/2021   Procedure: BRONCHIAL WASHINGS;  Surgeon: Brand Males, MD;  Location: [facility];  Service: Endoscopy;;   CATARACT EXTRACTION W/ INTRAOCULAR LENS  IMPLANT, BILATERAL  2014;  2017   MOUTH SURGERY  11/2017   Upper quadrants,  per pt peridontist did laser procedure on gums   ROBOTIC ASSISTED TOTAL HYSTERECTOMY WITH BILATERAL SALPINGO OOPHERECTOMY Bilateral 07/22/2018   Procedure: XI ROBOTIC ASSISTED TOTAL HYSTERECTOMY WITH BILATERAL SALPINGO OOPHORECTOMY AND SENTINAL LYMPH NODE BIOPSY;   Surgeon: Everitt Amber, MD;  Location: [facility];  Service: Gynecology;  Laterality: Bilateral;   TONSILLECTOMY  child   VIDEO BRONCHOSCOPY N/A 06/27/2021   Procedure: VIDEO BRONCHOSCOPY WITHOUT FLUORO;  Surgeon: Brand Males, MD;  Location: [facility];  Service: Endoscopy;  Laterality: N/A;    Allergies  Allergen Reactions   Lovastatin    Rosuvastatin  Other (See Comments)    Immunization History  Administered Date(s) Administered   Moderna Sars-Covid-2 Vaccination 11/26/2019, 12/22/2019, 08/12/2020   Pneumococcal Conjugate-13 01/29/2017   Pneumococcal Polysaccharide-23 08/01/2012   Tdap 01/17/2011   Zoster, Live 05/15/2012    Family History  Problem Relation Age of Onset   Heart disease Mother    Heart failure Mother    Heart disease Father    Prostate cancer Father    Clotting disorder Father    Diabetes Maternal Grandfather    Colon cancer Neg Hx      Current Outpatient Medications:    acetaminophen (TYLENOL) 650 MG CR tablet, Take 650 mg by mouth every 8 (eight) hours as needed for pain., Disp: , Rfl:    allopurinol (ZYLOPRIM) 300 MG tablet, Take 300 mg by mouth daily., Disp: , Rfl:    amLODipine (NORVASC) 2.5 MG tablet, Take 1 tablet by mouth once daily, Disp: 90 tablet, Rfl: 3   ASA-APAP-Caff Buffered (VANQUISH PO), Take 2 tablets by mouth 2 (two) times a week., Disp: , Rfl:    B-D TB SYRINGE 1CC/27GX1/2" 27G X 1/2" 1 ML MISC, USE WEEKLY WITH METHOTREXATE SUBCUTANEOUSLY 90 DAYS, Disp: , Rfl:    ciclesonide (ALVESCO) 80 MCG/ACT inhaler, Inhale 1 puff into the lungs 2 (two) times daily., Disp: 1 each, Rfl: 0   diphenhydrAMINE (BENADRYL) 25 MG tablet, Take 25 mg by mouth at bedtime as needed for sleep. , Disp: , Rfl:    esomeprazole (NEXIUM) 40 MG capsule, Take 40 mg by mouth 3 (three) times a week., Disp: , Rfl:    etanercept (ENBREL) 50 MG/ML injection, Inject 25 mg into the skin 2 (two) times a week., Disp: , Rfl:    Ferrous Gluconate 324 (37.5 Fe) MG TABS, Take 1  tablet by mouth 2 (two) times daily., Disp: , Rfl:    folic acid (FOLVITE) 481 MCG tablet, Take 800 mcg by mouth daily., Disp: , Rfl:    hydrochlorothiazide (HYDRODIURIL) 25 MG tablet, Take 12.5 mg by mouth daily., Disp: , Rfl:    Magnesium 500 MG TABS, Take 500 mg by mouth daily., Disp: , Rfl:    Melatonin 3 MG TABS, Take 3-6 mg by mouth at bedtime as needed (sleep). , Disp: , Rfl:    methotrexate 250 MG/10ML injection, once a week. 0.5 ml, Disp: , Rfl:    oxybutynin (DITROPAN) 5 MG tablet, Take 5 mg by mouth 2 (two) times daily., Disp: , Rfl:    pioglitazone (ACTOS) 30 MG tablet, Take 30 mg by mouth daily. , Disp: , Rfl:    Turmeric 500 MG CAPS, Take 500 mg by mouth daily., Disp: , Rfl:    vitamin B-12 (CYANOCOBALAMIN) 1000 MCG tablet, Take 1,000 mcg by mouth daily., Disp: , Rfl:    vitamin C (ASCORBIC ACID) 500 MG tablet, Take 500 mg by mouth daily., Disp: , Rfl:       Objective:   Vitals:   07/19/21 1333  BP: 120/72  Pulse: 84  Temp: 97.6 F (36.4 C)  TempSrc: Oral  SpO2: 96%  Weight: 189 lb 9.6 oz (86 kg)  Height: _0  (1.473 m)    Estimated body mass index is 39.63 kg/m as calculated from the following:   Height as of this encounter: _1  (1.473 m).   Weight as of this encounter: 189 lb 9.6 oz (86 kg).  _2 @  Filed Weights   07/19/21 1333  Weight: 189 lb 9.6 oz (86 kg)     Physical  Exam    General: No distress. obse Neuro: Alert and Oriented x 3. GCS 15. Speech normal Psych: Pleasant Resp:  Barrel Chest - no.  Wheeze - no, Crackles - yes at base, No overt respiratory distress CVS: Normal heart sounds. Murmurs - n Ext: Stigmata of Connective Tissue Disease - RA. HAS WALKER HEENT: Normal upper airway. PEERL +. No post nasal drip        Assessment:       ICD-10-CM   1. Interstitial lung disease due to connective tissue disease ([place])  J84.89    M35.9     2. Exercise hypoxemia  R09.02     3. 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  Hiatal hernia  K44.9     5. 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 Dyspnea on exertion  R06.00     7. Chronic cough  R05.3          Plan:     Patient Instructions     ICD-10-CM   1. Interstitial lung disease due to connective tissue disease ([place])  J84.89    M35.9     2. Exercise hypoxemia  R09.02     3. 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 Hiatal hernia  K44.9     5. 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 Dyspnea on exertion  R06.00     7. Chronic cough  R05.3       Under bronchoscopy with lavage in September 2022 there is no evidence of infection but there is mild pulmonary eosinophilia which means your interstitial lung disease/pulmonary fibrosis is either from rheumatoid arthritis but could also be from medications such as allopurinol, rarely methotrexate or hydrochlorothiazide  Overall I think interstitial lung disease is not from the medications but from rheumatoid arthritis  I think the mild pulmonary eosinophilia [from RA] and the hiatal hernia is making you cough  Your shortness of breath itself is from the pulmonary fibrosis but also from other recent surgeries weight, physical deconditioning and stiff heart muscle  The good news is there is no evidence of infection   Plan -Recommend overnight oxygen and exertional oxygen but respect your desire not to do that - Recommend sleep study evaluation but respect your desire not to do that - Recommend continued observation of interstitial lung disease -and antifibrotic's if you show evidence of decline -Try oral inhaler Alvesco 46mg dose -1 puff twice daily to see if this will help your cough through indirect control of eosinophilia -No changes in your baseline medications but we will continue to watch your situation  Followup  -Do spirometry and DLCO in 3-4 months - Return to see Dr. RChase Callerin 3-4 months for 30-minute visit [ILD symptom questionnaire and walking desaturation test at follow-up]   ( Level  05 visit: Estb 40-54 min    in  visit type: on-site physical face to visit  in total care time and counseling or/and coordination of care by this undersigned MD - Dr MBrand Males This includes one or more of the following on this same day 07/19/2021: pre-charting, chart review, note writing, documentation discussion of test results, diagnostic or treatment recommendations, prognosis, risks and benefits of management options, instructions, education, compliance or risk-factor reduction. It excludes time spent by the [facility] office staff in the care of the patient. Actual time 473min)  SIGNATURE    Dr. MBrand Males M.D., F.C.C.P,  Pulmonary and Critical Care Medicine  Staff Physician, [address] Director - Interstitial Lung Disease  Program  Pulmonary [place] at [place], [place], [address]  Pager: [phone], If no answer or between  15:00h - 7:00h: call [phone] Telephone: [phone]  2:27 PM 07/19/2021

## 2021-07-20 DIAGNOSIS — B372 Candidiasis of skin and nail: Secondary | ICD-10-CM | POA: Diagnosis not present

## 2021-07-20 DIAGNOSIS — Z01411 Encounter for gynecological examination (general) (routine) with abnormal findings: Secondary | ICD-10-CM | POA: Diagnosis not present

## 2021-07-27 ENCOUNTER — Telehealth: Payer: Self-pay | Admitting: Internal Medicine

## 2021-07-27 NOTE — Telephone Encounter (Signed)
I have called and spoke with pt and she is aware of last OV note has been faxed to Leafy Kindle, [place] at [facility].  Nothing further is needed.

## 2021-08-03 LAB — FUNGUS CULTURE RESULT

## 2021-08-03 LAB — FUNGAL ORGANISM REFLEX

## 2021-08-03 LAB — FUNGUS CULTURE WITH STAIN

## 2021-08-15 LAB — ACID FAST CULTURE WITH REFLEXED SENSITIVITIES (MYCOBACTERIA): Acid Fast Culture: NEGATIVE

## 2021-09-07 DIAGNOSIS — G4486 Cervicogenic headache: Secondary | ICD-10-CM | POA: Diagnosis not present

## 2021-09-07 DIAGNOSIS — M47812 Spondylosis without myelopathy or radiculopathy, cervical region: Secondary | ICD-10-CM | POA: Diagnosis not present

## 2021-09-07 DIAGNOSIS — M7918 Myalgia, other site: Secondary | ICD-10-CM | POA: Diagnosis not present

## 2021-10-05 DIAGNOSIS — R5382 Chronic fatigue, unspecified: Secondary | ICD-10-CM | POA: Diagnosis not present

## 2021-10-05 DIAGNOSIS — M0579 Rheumatoid arthritis with rheumatoid factor of multiple sites without organ or systems involvement: Secondary | ICD-10-CM | POA: Diagnosis not present

## 2021-10-05 DIAGNOSIS — M1A09X0 Idiopathic chronic gout, multiple sites, without tophus (tophi): Secondary | ICD-10-CM | POA: Diagnosis not present

## 2021-10-05 DIAGNOSIS — M2550 Pain in unspecified joint: Secondary | ICD-10-CM | POA: Diagnosis not present

## 2021-10-31 DIAGNOSIS — R7989 Other specified abnormal findings of blood chemistry: Secondary | ICD-10-CM | POA: Diagnosis not present

## 2021-11-01 DIAGNOSIS — I700 Atherosclerosis of aorta: Secondary | ICD-10-CM | POA: Diagnosis not present

## 2021-11-01 DIAGNOSIS — E11319 Type 2 diabetes mellitus with unspecified diabetic retinopathy without macular edema: Secondary | ICD-10-CM | POA: Diagnosis not present

## 2021-11-01 DIAGNOSIS — Z0000 Encounter for general adult medical examination without abnormal findings: Secondary | ICD-10-CM | POA: Diagnosis not present

## 2021-11-01 DIAGNOSIS — I10 Essential (primary) hypertension: Secondary | ICD-10-CM | POA: Diagnosis not present

## 2021-11-16 DIAGNOSIS — S0083XA Contusion of other part of head, initial encounter: Secondary | ICD-10-CM | POA: Diagnosis not present

## 2021-11-16 DIAGNOSIS — W19XXXA Unspecified fall, initial encounter: Secondary | ICD-10-CM | POA: Diagnosis not present

## 2021-11-23 DIAGNOSIS — R7989 Other specified abnormal findings of blood chemistry: Secondary | ICD-10-CM | POA: Diagnosis not present

## 2021-11-29 DIAGNOSIS — Z1231 Encounter for screening mammogram for malignant neoplasm of breast: Secondary | ICD-10-CM | POA: Diagnosis not present

## 2021-11-30 ENCOUNTER — Other Ambulatory Visit: Payer: Self-pay | Admitting: Obstetrics and Gynecology

## 2021-11-30 DIAGNOSIS — R928 Other abnormal and inconclusive findings on diagnostic imaging of breast: Secondary | ICD-10-CM

## 2021-12-22 ENCOUNTER — Other Ambulatory Visit: Payer: Self-pay | Admitting: Internal Medicine

## 2021-12-25 ENCOUNTER — Ambulatory Visit
Admission: RE | Admit: 2021-12-25 | Discharge: 2021-12-25 | Disposition: A | Discharge status: Home / Self Care | Payer: Medicare Other | Source: Ambulatory Visit | Attending: Obstetrics and Gynecology | Admitting: Obstetrics and Gynecology

## 2021-12-25 ENCOUNTER — Ambulatory Visit: Payer: Medicare Other

## 2021-12-25 ENCOUNTER — Other Ambulatory Visit: Payer: Self-pay

## 2021-12-25 DIAGNOSIS — R922 Inconclusive mammogram: Secondary | ICD-10-CM | POA: Diagnosis not present

## 2021-12-25 DIAGNOSIS — R928 Other abnormal and inconclusive findings on diagnostic imaging of breast: Secondary | ICD-10-CM

## 2022-01-11 DIAGNOSIS — M0579 Rheumatoid arthritis with rheumatoid factor of multiple sites without organ or systems involvement: Secondary | ICD-10-CM | POA: Diagnosis not present

## 2022-01-17 DIAGNOSIS — J849 Interstitial pulmonary disease, unspecified: Secondary | ICD-10-CM | POA: Diagnosis not present

## 2022-01-17 DIAGNOSIS — I10 Essential (primary) hypertension: Secondary | ICD-10-CM | POA: Diagnosis not present

## 2022-01-17 DIAGNOSIS — E782 Mixed hyperlipidemia: Secondary | ICD-10-CM | POA: Diagnosis not present

## 2022-01-17 DIAGNOSIS — E11319 Type 2 diabetes mellitus with unspecified diabetic retinopathy without macular edema: Secondary | ICD-10-CM | POA: Diagnosis not present

## 2022-01-27 ENCOUNTER — Other Ambulatory Visit: Payer: Self-pay | Admitting: Internal Medicine

## 2022-02-23 ENCOUNTER — Encounter: Payer: Self-pay | Admitting: Obstetrics & Gynecology

## 2022-02-27 NOTE — Progress Notes (Signed)
Follow Up Note: Gyn-Onc ? ?Madison Holden 76 y.o. female ? ?CC: She presents for a f/u visit. ? ? ?HPI: The oncology history was reviewed. ? ?Interval History: She denies any vaginal bleeding, abdominal/pelvic pain, cough, lethargy or abdominal distention.    ? ?Review of Systems  ?Review of Systems  ?Constitutional:  Negative for malaise/fatigue and weight loss.  ?Respiratory:  Negative for shortness of breath and wheezing.   ?Cardiovascular:  Negative for chest pain and leg swelling.  ?Gastrointestinal:  Negative for abdominal pain, blood in stool, constipation, nausea and vomiting.  ?Genitourinary:  Negative for dysuria, frequency, hematuria and urgency.  ?Musculoskeletal:  Negative for joint pain and myalgias.  ?Neurological:  Negative for weakness.  ?Psychiatric/Behavioral:  Negative for depression. The patient does not have insomnia.   ? ?Current medications, allergy, social history, past surgical history, past medical history, family history were all reviewed. ? ? ? ?Vitals:  BP (!) 145/50 (BP Location: Left Arm, Patient Position: Sitting)   Pulse 83   Temp 97.8 ?F (36.6 ?C) (Oral)   Resp 18   Ht _0  (1.473 m)   Wt 173 lb 14.4 oz (78.9 kg)   SpO2 95%   BMI 36.35 kg/m?   ? ?Physical Exam:  ?Physical Exam ?Exam conducted with a chaperone present.  ?Constitutional:   ?   General: She is not in acute distress. ?Cardiovascular:  ?   Rate and Rhythm: Normal rate and regular rhythm.  ?Pulmonary:  ?   Effort: Pulmonary effort is normal.  ?   Breath sounds: Normal breath sounds. No wheezing or rhonchi.  ?Abdominal:  ?   Palpations: Abdomen is soft.  ?   Tenderness: There is no abdominal tenderness. There is no right CVA tenderness or left CVA tenderness.  ?   Hernia: No hernia is present.  ?Genitourinary: ?   General: Normal vulva.  ?   Urethra: No urethral lesion.  ?   Vagina: No lesions. No bleeding ?Musculoskeletal:  ?   Cervical back: Neck supple.  ?   Right lower leg: No edema.  ?   Left lower leg: No  edema.  ?Lymphadenopathy:  ?   Upper Body:  ?   Right upper body: No supraclavicular adenopathy.  ?   Left upper body: No supraclavicular adenopathy.  ?   Lower Body: No right inguinal adenopathy. No left inguinal adenopathy.  ?Skin: ?   Findings: No rash.  ?Neurological:  ?   Mental Status: She is oriented to person, place, and time.  ? 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 ? Negative symptom review, normal exam.  No evidence of recurrence ? ? ?Plan ?> continue to have follow-up every 6 months for 5 years in accordance with NCCN guidelines.  ?She will follow up with GYN-ONC in 12 months and in 6 months with Dr Philis Pique.   ? ? ?Lahoma Crocker, MD  ?

## 2022-02-27 NOTE — Assessment & Plan Note (Signed)
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 ??Negative symptom review, normal exam.  No evidence of recurrence ? ? ?Plan ?> continue to have follow-up every 6 months for 5 years in accordance with NCCN guidelines.? ?She will follow up with GYN-ONC in 12 months and in 6 months with Dr Philis Pique.  ?

## 2022-02-28 ENCOUNTER — Inpatient Hospital Stay: Payer: Medicare Other | Attending: Obstetrics & Gynecology | Admitting: Obstetrics & Gynecology

## 2022-02-28 ENCOUNTER — Encounter: Payer: Self-pay | Admitting: Obstetrics & Gynecology

## 2022-02-28 ENCOUNTER — Other Ambulatory Visit: Payer: Self-pay

## 2022-02-28 VITALS — BP 145/50 | HR 83 | Temp 97.8°F | Resp 18 | Ht <= 58 in | Wt 173.9 lb

## 2022-02-28 DIAGNOSIS — Z8542 Personal history of malignant neoplasm of other parts of uterus: Secondary | ICD-10-CM | POA: Insufficient documentation

## 2022-02-28 DIAGNOSIS — C541 Malignant neoplasm of endometrium: Secondary | ICD-10-CM

## 2022-02-28 NOTE — Patient Instructions (Signed)
Return in 1 year ?

## 2022-03-30 ENCOUNTER — Encounter (INDEPENDENT_AMBULATORY_CARE_PROVIDER_SITE_OTHER): Payer: Medicare Other | Admitting: Ophthalmology

## 2022-03-30 DIAGNOSIS — H43813 Vitreous degeneration, bilateral: Secondary | ICD-10-CM

## 2022-03-30 DIAGNOSIS — E113293 Type 2 diabetes mellitus with mild nonproliferative diabetic retinopathy without macular edema, bilateral: Secondary | ICD-10-CM

## 2022-03-30 DIAGNOSIS — I10 Essential (primary) hypertension: Secondary | ICD-10-CM | POA: Diagnosis not present

## 2022-03-30 DIAGNOSIS — H35033 Hypertensive retinopathy, bilateral: Secondary | ICD-10-CM | POA: Diagnosis not present

## 2022-03-30 DIAGNOSIS — D3132 Benign neoplasm of left choroid: Secondary | ICD-10-CM | POA: Diagnosis not present

## 2022-04-11 DIAGNOSIS — M0579 Rheumatoid arthritis with rheumatoid factor of multiple sites without organ or systems involvement: Secondary | ICD-10-CM | POA: Diagnosis not present

## 2022-04-11 DIAGNOSIS — E11319 Type 2 diabetes mellitus with unspecified diabetic retinopathy without macular edema: Secondary | ICD-10-CM | POA: Diagnosis not present

## 2022-04-11 DIAGNOSIS — M2550 Pain in unspecified joint: Secondary | ICD-10-CM | POA: Diagnosis not present

## 2022-04-11 DIAGNOSIS — R5382 Chronic fatigue, unspecified: Secondary | ICD-10-CM | POA: Diagnosis not present

## 2022-04-11 DIAGNOSIS — K219 Gastro-esophageal reflux disease without esophagitis: Secondary | ICD-10-CM | POA: Diagnosis not present

## 2022-04-11 DIAGNOSIS — E7800 Pure hypercholesterolemia, unspecified: Secondary | ICD-10-CM | POA: Diagnosis not present

## 2022-04-11 DIAGNOSIS — I10 Essential (primary) hypertension: Secondary | ICD-10-CM | POA: Diagnosis not present

## 2022-04-11 DIAGNOSIS — M1A09X0 Idiopathic chronic gout, multiple sites, without tophus (tophi): Secondary | ICD-10-CM | POA: Diagnosis not present

## 2022-04-17 NOTE — Progress Notes (Addendum)
Cardiology Office Note   Date:  04/18/2022   ID:  DALYAH PLA, DOB 06-11-1946, MRN [id number]  PCP:  Aura Dials, MD  Cardiologist:   Dorris Carnes, MD    F/U of HTN     History of Present Illness: Madison Holden is a 76 y.o. 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   allopurinol (ZYLOPRIM) 300 MG tablet Take 300 mg by mouth daily.   amLODipine (NORVASC) 2.5 MG tablet TAKE 1 TABLET BY MOUTH ONCE DAILY PATIENT  NEEDS  TO  SCHEDULE  APPOINTMENT  WITH  PROVIDER  FOR  FURTHER  REFILLS   ASA-APAP-Caff Buffered (VANQUISH PO) Take 2 tablets by mouth 2 (two) times a week.   B-D TB SYRINGE 1CC/27GX1/2" 27G X 1/2" 1 ML MISC USE WEEKLY WITH METHOTREXATE SUBCUTANEOUSLY 90 DAYS   Baclofen 5 MG TABS Take 0.5-1 tablets by mouth at bedtime as needed.   diphenhydrAMINE (BENADRYL) 25 MG tablet Take 25 mg by mouth at bedtime as needed for sleep.    esomeprazole (NEXIUM) 40 MG capsule Take 40 mg by mouth 3 (three) times a week.   etanercept (ENBREL) 50 MG/ML injection Inject 25 mg into the skin 2 (two) times a week.   Ferrous Gluconate 324 (37.5 Fe) MG TABS Take 1 tablet by mouth 2 (two) times daily.   folic acid (FOLVITE) 387 MCG tablet Take 800 mcg by mouth daily.   hydrochlorothiazide (HYDRODIURIL) 25 MG tablet Take 12.5 mg by mouth daily.   ketoconazole (NIZORAL) 2  % cream SMARTSIG:sparingly Topical Twice Daily   Magnesium 500 MG TABS Take 500 mg by mouth daily.   Melatonin 3 MG TABS Take 3-6 mg by mouth at bedtime as needed (sleep).    methotrexate 250 MG/10ML injection once a week. 0.5 ml   oxybutynin (DITROPAN) 5 MG tablet Take 5 mg by mouth 2 (two) times daily.   pioglitazone (ACTOS) 30 MG tablet Take 30 mg by mouth daily.    Turmeric 500 MG CAPS Take 500 mg by mouth daily.   vitamin B-12 (CYANOCOBALAMIN) 1000 MCG tablet Take 1,000 mcg by mouth daily.   vitamin C (ASCORBIC ACID) 500 MG tablet Take 500 mg by mouth daily.     Allergies:   Lovastatin and Rosuvastatin   Past Medical History:  Diagnosis Date   Ambulates with cane    due to gout    Arthritis    hands   Chest pain, unspecified 07-01-2018  per pt had 2 day chest pain and sob  6 weeks ago,, per pt no symptoms since   cardiology-- dr Salley Scarlet--  scheduled for stress test and  echo 07-03-2018 prior to surgery scheduled 07-08-2018   Diarrhea, unspecified    intermittant   DOE (dyspnea on exertion)    Endometrial cancer ([place])    Essential hypertension    Frequency of urination    GERD (gastroesophageal reflux disease)    Gout rheumologist-- michelle young PA ([facility])   07-01-2018 per pt flare up april 2019 , hands and wrists   Heart murmur    History of adenomatous polyp of colon    History of esophageal dilatation    for stricture   Mild nausea    per pt intermittant , no vomiting   Mixed dyslipidemia    Muscle weakness of extremity    bilateral upper and lower extremities due to gout   Poor appetite    per pt    Type 2 diabetes mellitus ([place])    followed by pcp   Urgency of urination    Urine frequency     Past Surgical History:  Procedure Laterality Date   BREAST CYST EXCISION Right 1974   benign per pt   BRONCHIAL WASHINGS  06/27/2021   Procedure: BRONCHIAL WASHINGS;  Surgeon: Brand Males, MD;  Location: [facility];  Service: Endoscopy;;    CATARACT EXTRACTION W/ INTRAOCULAR LENS  IMPLANT, BILATERAL  2014;  2017   MOUTH SURGERY  11/2017   Upper quadrants,  per pt peridontist did laser procedure on gums   ROBOTIC ASSISTED TOTAL HYSTERECTOMY WITH BILATERAL SALPINGO OOPHERECTOMY Bilateral 07/22/2018   Procedure: XI ROBOTIC ASSISTED TOTAL HYSTERECTOMY WITH BILATERAL SALPINGO OOPHORECTOMY AND SENTINAL LYMPH NODE BIOPSY;  Surgeon: Everitt Amber, MD;  Location: [facility];  Service: Gynecology;  Laterality: Bilateral;   TONSILLECTOMY  child   VIDEO BRONCHOSCOPY N/A 06/27/2021   Procedure: VIDEO BRONCHOSCOPY WITHOUT FLUORO;  Surgeon: Brand Males, MD;  Location: [facility];  Service: Endoscopy;  Laterality: N/A;     Social History:  The patient  reports that she has never smoked. She has never used smokeless tobacco. She reports that she does not currently use alcohol. She reports that she does not use drugs.   Family History:  The patient's family history includes Clotting disorder in her father; Diabetes in her maternal grandfather; Heart disease in her father and mother; Heart failure in her mother; Prostate cancer in her father.    ROS:  Please see the history of present illness. All other systems are reviewed and  Negative to the above problem except as noted.    PHYSICAL EXAM: VS:  BP 114/70   Pulse 87   Ht '4\' 10"'$  (1.473 m)   Wt 177 lb 6.4 oz (80.5 kg)   SpO2 93%   BMI 37.08 kg/m    GEN: Morbdily obese 76 yo in no acute distress  HEENT: normal  Neck: JVP is normal     Cardiac: RRR; no murmurs, ,no LE edema  Respiratory:  clear to auscultation bilaterally,  GI: soft, nontender, nondistended, + BS  MS: no deformity Moving all extremities   Skin: warm and dry, no rash Neuro:  Strength and sensation are intact Psych: euthymic mood, full affect   EKG:  EKG shows NSR 87 bpm  Nonspecific ST   T wav enversion in III. AVF  Lipid Panel    Component Value Date/Time   CHOL 125 04/18/2020 0735   TRIG 191 (H) 04/18/2020 0735    HDL 46 04/18/2020 0735   CHOLHDL 2.7 04/18/2020 0735   CHOLHDL 4 06/29/2010 0948   VLDL 41.0 (H) 06/29/2010 8786  LDLCALC 48 04/18/2020 0735   LDLDIRECT 56.6 06/29/2010 0948      Wt Readings from Last 3 Encounters:  04/18/22 177 lb 6.4 oz (80.5 kg)  02/28/22 173 lb 14.4 oz (78.9 kg)  07/19/21 189 lb 9.6 oz (86 kg)      ASSESSMENT AND PLAN:  1  HTN  BP is  well controlled.   Keep curr regimen    2  HL    LDL is 82    Needs to be lower   I will review with pharmacy medical Rx   She is intolerant to statins    Does not want an injection  ? Zetia alone   ? Nexlitol     3  CAD The pt had CT in July 2022   Severe coronary calcifications noted   She is asymptomatic though not too active       4   RA   Pt followed by Garrett NP       5   ILD  Pt seen by Ann Lions.    Need to arrange follow up    She was shown to desaturate with activity and at night   Not using O2    Stressed importance of using if needed    F/U in the fall   Current medicines are reviewed at length with the patient today.  The patient does not have concerns regarding medicines.  Signed, Dorris Carnes, MD  04/18/2022 8:34 AM    [address] [address], [place], [place]  [address] Phone: [phone]; Fax: [phone]

## 2022-04-18 ENCOUNTER — Encounter: Payer: Self-pay | Admitting: Internal Medicine

## 2022-04-18 ENCOUNTER — Ambulatory Visit: Payer: Medicare Other | Admitting: Internal Medicine

## 2022-04-18 VITALS — BP 114/70 | HR 87 | Ht <= 58 in | Wt 177.4 lb

## 2022-04-18 DIAGNOSIS — I2510 Atherosclerotic heart disease of native coronary artery without angina pectoris: Secondary | ICD-10-CM | POA: Diagnosis not present

## 2022-04-18 MED ORDER — AMLODIPINE BESYLATE 2.5 MG PO TABS: ORAL_TABLET | ORAL | 3 refills | Status: DC

## 2022-04-18 NOTE — Patient Instructions (Signed)
Medication Instructions:   *If you need a refill on your cardiac medications before your next appointment, please call your pharmacy*   Lab Work:  If you have labs (blood work) drawn today and your tests are completely normal, you will receive your results only by: [place] (if you have MyChart) OR A paper copy in the mail If you have any lab test that is abnormal or we need to change your treatment, we will call you to review the results.   Testing/Procedures:    Follow-Up: At [facility], you and your health needs are our priority.  As part of our continuing mission to provide you with exceptional heart care, we have created designated Provider Care Teams.  These Care Teams include your primary Cardiologist (physician) and Advanced Practice Providers (APPs -  Physician Assistants and Nurse Practitioners) who all work together to provide you with the care you need, when you need it.  We recommend signing up for the patient portal called "MyChart".  Sign up information is provided on this After Visit Summary.  MyChart is used to connect with patients for Virtual Visits (Telemedicine).  Patients are able to view lab/test results, encounter notes, upcoming appointments, etc.  Non-urgent messages can be sent to your provider as well.   To learn more about what you can do with MyChart, go to [email or web address].    1}    Other Instructions   Important Information About Sugar

## 2022-05-03 ENCOUNTER — Telehealth: Payer: Self-pay | Admitting: Internal Medicine

## 2022-05-03 DIAGNOSIS — E782 Mixed hyperlipidemia: Secondary | ICD-10-CM

## 2022-05-03 DIAGNOSIS — I700 Atherosclerosis of aorta: Secondary | ICD-10-CM

## 2022-05-03 NOTE — Telephone Encounter (Signed)
THN wrote in that pt intolerant to statins   Does not want injectable Can we see if she would be a candidate for Nexlizet?

## 2022-05-04 ENCOUNTER — Telehealth: Payer: Self-pay | Admitting: Internal Medicine

## 2022-05-04 DIAGNOSIS — E782 Mixed hyperlipidemia: Secondary | ICD-10-CM

## 2022-05-04 DIAGNOSIS — I700 Atherosclerosis of aorta: Secondary | ICD-10-CM

## 2022-05-04 MED ORDER — NEXLIZET 180-10 MG PO TABS: 1.0000 | ORAL_TABLET | Freq: Every day | ORAL | 3 refills | Status: DC

## 2022-05-04 NOTE — Telephone Encounter (Signed)
Pt c/o medication issue:  1. Name of Medication: Bempedoic Acid-Ezetimibe (NEXLIZET) 180-10 MG TABS  2. How are you currently taking this medication (dosage and times per day)? Take 1 tablet by mouth daily.  3. Are you having a reaction (difficulty breathing--STAT)? No   4. What is your medication issue? [place], [place] - [address] is on back order for the medication.

## 2022-05-04 NOTE — Telephone Encounter (Addendum)
Will complete PA to see if medication is covered. Key: [id number]

## 2022-05-04 NOTE — Telephone Encounter (Addendum)
PA approved for Nexlizet through 05/05/23.  Rx sent to pharmacy

## 2022-05-04 NOTE — Addendum Note (Signed)
Addended by: Rollen Sox on: 05/04/2022 11:48 AM   Modules accepted: Orders

## 2022-05-07 NOTE — Telephone Encounter (Signed)
Lm for the pt to be sure she was able to pick up the Nexlizet.. will forward to dr Harrington Challenger to see if she would like repeat labs in 8 weeks.

## 2022-05-09 ENCOUNTER — Telehealth: Payer: Self-pay | Admitting: Internal Medicine

## 2022-05-09 DIAGNOSIS — I2510 Atherosclerotic heart disease of native coronary artery without angina pectoris: Secondary | ICD-10-CM

## 2022-05-09 DIAGNOSIS — Z789 Other specified health status: Secondary | ICD-10-CM

## 2022-05-09 DIAGNOSIS — E782 Mixed hyperlipidemia: Secondary | ICD-10-CM

## 2022-05-09 NOTE — Telephone Encounter (Signed)
Pt contacted;  Questions regarding Nexlizet 180-10 mg tablets ordered by Dr. Harrington Challenger to address HL.     Pt is concerned about the cost of Nexlizet; Pt stated $270 at her pharmacy.  2.   Pt concerned about the side effects of Nexlizet; Listed on website known to cause joint inflammation, Pt has PMH Gout.  Pt also has pulmonary concerns taking this new medication.  3.   Spoke to Pt husband, Madison Holden.  Husband called drug company regarding med.  He had questions regarding the study, and how many patients saw a decrease in the LDL cholesterol, including the patient sample size tested?  4.   Pt husband stated they have not filled the prescription yet.  They would both like to speak with Dr. Harrington Challenger for more detailed information about the Nexlizet treatment option VS. Madison Holden current health concerns.   Madison Holden is not apposed to trying Nexlizet, but is not comfortable trying the medication yet.  Pt advised that I will forward a message to Dr. Harrington Challenger and Alden Hipp RN for follow-up.

## 2022-05-09 NOTE — Telephone Encounter (Signed)
Left the pt a message to call the office back. 

## 2022-05-09 NOTE — Telephone Encounter (Signed)
Left a message for the pt to call back....  may need referral to the Lipid Clinic to discuss all of her options for Lipid Lowering.

## 2022-05-09 NOTE — Telephone Encounter (Signed)
Pt c/o medication issue:  1. Name of Medication: Nexlizet  2. How are you currently taking this medication (dosage and times per day)?   3. Are you having a reaction (difficulty breathing--STAT)?   4. What is your medication issue? She is concerned about the cost versus the benefits of the medicine and also the side effects

## 2022-05-09 NOTE — Telephone Encounter (Signed)
Patient returning call for [facility]

## 2022-05-09 NOTE — Telephone Encounter (Signed)
Yes, Nexlizet is known to increase risk of gout flares.  If patient concerned, recommend trying Repatha/Praluent which does not have the same side effect and is only given once every 2 weeks

## 2022-05-10 ENCOUNTER — Encounter: Payer: Self-pay | Admitting: Internal Medicine

## 2022-05-10 NOTE — Telephone Encounter (Signed)
error 

## 2022-05-10 NOTE — Addendum Note (Signed)
Addended by: Nuala Alpha on: 05/10/2022 09:23 AM   Modules accepted: Orders

## 2022-05-10 NOTE — Telephone Encounter (Signed)
Pt aware of Dr. Harrington Challenger and Pharmacist recommendations regarding Nexlizet.  Pt aware we will refer her to lipid clinic to see the Pharmacist, for further med management. Pt aware I will place the referral to lipid clinic in the system and send a message to our [facility] Schedulers to call her back and arrange this appt. Pt verbalized understanding and agrees with this plan.

## 2022-05-10 NOTE — Telephone Encounter (Signed)
Patient called to return RN's call regarding getting her Nexlizet prescription at reduced cost or alternate medication.

## 2022-05-28 ENCOUNTER — Ambulatory Visit (INDEPENDENT_AMBULATORY_CARE_PROVIDER_SITE_OTHER): Payer: Medicare Other | Admitting: Pharmacist

## 2022-05-28 DIAGNOSIS — E782 Mixed hyperlipidemia: Secondary | ICD-10-CM | POA: Diagnosis not present

## 2022-05-28 DIAGNOSIS — T466X5A Adverse effect of antihyperlipidemic and antiarteriosclerotic drugs, initial encounter: Secondary | ICD-10-CM

## 2022-05-28 DIAGNOSIS — G720 Drug-induced myopathy: Secondary | ICD-10-CM

## 2022-05-28 MED ORDER — ROSUVASTATIN CALCIUM 5 MG PO TABS: 5.0000 mg | ORAL_TABLET | Freq: Every day | ORAL | 3 refills | Status: DC

## 2022-05-28 NOTE — Progress Notes (Signed)
Patient ID: ARAYA ROEL                 DOB: [birth year]                    MRN: [id number]     HPI: Madison Holden is a 76 y.o. female patient referred to lipid clinic by Dr. Harrington Challenger. PMH is significant for DLD, HTN, DM, CAD, aortic atherosclerosis, RA, left ventricular hypertrophy, and gout. Echocardiogram on 06/20/22 showed EF of 60-65%. At the last visit with Dr. Harrington Challenger, the pt's LDL was noted to be 71. Pt does not wish to start injectible therapy, so a PA was approved for Nexlizet. A patient message was sent 05/04/22 stating that the medication is on backorder at her pharmacy.  Pt presents to clinic today with her husband, Delfino Lovett, in good spirits. She reports that her symptoms of joint pain associated from RA are well controlled at this time. She denies any symptoms of palpitations, chest pain, or excess fatigue. She reported no issues tolerating rosuvastatin historically and it was stopped for a mild increase in LFTs just above the ULN. Of note, pt also takes Enbrel and methotrexate that can increase LFTs. At that time she did not notice an increase in myalgias, but had severe RA associated joint pain. She did however reports intolerance to Zetia due to myalgias. She did not pick up the Nexlizet because she and her husband were concerned with the list of adverse effects listed by the manufacturer. She also reported that the cost of the medication would not be sustainable long term. Pt reports limited exercise due to her RA. Pt is not interested in any injectable medications at this time.  Current Medications: none Intolerances: lovastatin, atorvastatin, rosuvastatin (mild LFT elevation, but also on Enbrel), ezetimibe (joint pain) Risk Factors:  LDL goal: <55 mg/dL  Diet: Reports a recent lapse in diet, however generally tries to stick with chicken rather than beef, fresh vegetables, potatoes, cheese toast, and sweet potato casserole  Exercise: Pt noted that her RA limits her ability to exercise  and does not regularly do so  Family History:  Family History  Problem Relation Age of Onset   Heart disease Mother    Heart failure Mother    Heart disease Father    Prostate cancer Father    Clotting disorder Father    Diabetes Maternal Grandfather    Colon cancer Neg Hx    Social History:  Social History   Socioeconomic History   Marital status: Married    Spouse name: Not on file   Number of children: 0   Years of education: Not on file   Highest education level: Not on file  Occupational History   Occupation: retired  Tobacco Use   Smoking status: Never   Smokeless tobacco: Never  Vaping Use   Vaping Use: Never used  Substance and Sexual Activity   Alcohol use: Not Currently    Comment: 2-3 per  week, not currently drinking since Apri; 2019   Drug use: No   Sexual activity: Not on file  Other Topics Concern   Not on file  Social History Narrative   Has never smoked. Daily Caffeine Use- 2 cups per day. Does not get regular exercise. Retired.    Social Determinants of Health   Financial Resource Strain: Not on file  Food Insecurity: Not on file  Transportation Needs: Not on file  Physical Activity: Not on file  Stress: Not on  file  Social Connections: Not on file  Intimate Partner Violence: Not on file    Labs: Lipid panel 01/17/22 LDL 82, TG 134 (no medication) Lipid panel/LFTs 03/2020 on rosuvastatin 10 mg daily TC 125, TG 191, HDL 46, LDL 48, AST 56, ALT 54  Past Medical History:  Diagnosis Date   Ambulates with cane    due to gout    Arthritis    hands   Chest pain, unspecified 07-01-2018  per pt had 2 day chest pain and sob  6 weeks ago,, per pt no symptoms since   cardiology-- dr Salley Scarlet--  scheduled for stress test and echo 07-03-2018 prior to surgery scheduled 07-08-2018   Diarrhea, unspecified    intermittant   DOE (dyspnea on exertion)    Endometrial cancer ([place])    Essential hypertension    Frequency of urination    GERD  (gastroesophageal reflux disease)    Gout rheumologist-- michelle young PA ( rheumatology)   07-01-2018 per pt flare up april 2019 , hands and wrists   Heart murmur    History of adenomatous polyp of colon    History of esophageal dilatation    for stricture   Mild nausea    per pt intermittant , no vomiting   Mixed dyslipidemia    Muscle weakness of extremity    bilateral upper and lower extremities due to gout   Poor appetite    per pt    Type 2 diabetes mellitus ([place])    followed by pcp   Urgency of urination    Urine frequency     Current Outpatient Medications on File Prior to Visit  Medication Sig Dispense Refill   acetaminophen (TYLENOL) 650 MG CR tablet Take 650 mg by mouth every 8 (eight) hours as needed for pain.     allopurinol (ZYLOPRIM) 300 MG tablet Take 300 mg by mouth daily.     amLODipine (NORVASC) 2.5 MG tablet TAKE 1 TABLET BY MOUTH ONCE DAILY 90 tablet 3   ASA-APAP-Caff Buffered (VANQUISH PO) Take 2 tablets by mouth 2 (two) times a week.     B-D TB SYRINGE 1CC/27GX1/2" 27G X 1/2" 1 ML MISC USE WEEKLY WITH METHOTREXATE SUBCUTANEOUSLY 90 DAYS     Baclofen 5 MG TABS Take 0.5-1 tablets by mouth at bedtime as needed.     Bempedoic Acid-Ezetimibe (NEXLIZET) 180-10 MG TABS Take 1 tablet by mouth daily. 90 tablet 3   ciclesonide (ALVESCO) 80 MCG/ACT inhaler Inhale 1 puff into the lungs 2 (two) times daily. (Patient not taking: Reported on 04/18/2022) 1 each 0   diphenhydrAMINE (BENADRYL) 25 MG tablet Take 25 mg by mouth at bedtime as needed for sleep.      esomeprazole (NEXIUM) 40 MG capsule Take 40 mg by mouth 3 (three) times a week. (Patient not taking: Reported on 04/18/2022)     esomeprazole (NEXIUM) 40 MG capsule Take 40 mg by mouth 3 (three) times a week.     etanercept (ENBREL) 50 MG/ML injection Inject 25 mg into the skin 2 (two) times a week.     Ferrous Gluconate 324 (37.5 Fe) MG TABS Take 1 tablet by mouth 2 (two) times daily.     folic acid (FOLVITE)  297 MCG tablet Take 800 mcg by mouth daily.     hydrochlorothiazide (HYDRODIURIL) 25 MG tablet Take 12.5 mg by mouth daily.     ibuprofen (ADVIL) 200 MG tablet 1 tablet with food or milk as needed (Patient not taking: Reported on 04/18/2022)  ketoconazole (NIZORAL) 2 % cream SMARTSIG:sparingly Topical Twice Daily     Magnesium 500 MG TABS Take 500 mg by mouth daily.     Melatonin 3 MG TABS Take 3-6 mg by mouth at bedtime as needed (sleep).      methotrexate 250 MG/10ML injection once a week. 0.5 ml     oxybutynin (DITROPAN) 5 MG tablet Take 5 mg by mouth 2 (two) times daily.     pioglitazone (ACTOS) 30 MG tablet Take 30 mg by mouth daily.      Turmeric 500 MG CAPS Take 500 mg by mouth daily.     vitamin B-12 (CYANOCOBALAMIN) 1000 MCG tablet Take 1,000 mcg by mouth daily.     vitamin C (ASCORBIC ACID) 500 MG tablet Take 500 mg by mouth daily.     No current facility-administered medications on file prior to visit.    Allergies  Allergen Reactions   Lovastatin    Rosuvastatin Other (See Comments)    Assessment/Plan:  1. Hyperlipidemia - Most recent LDL of 82 is above goal of <55 mg/dL on no background therapy. She has previously tolerated Crestor 5 mg daily in the past. Will start Crestor 5 mg every MWF and increase if tolerated to 5 mg daily. Nexlizet is not affordable for the patient and may exacerbate her gout. She is not yet willing to try injectable medications, however pt was educated on Repatha administration and ADE in case needed in the future. Pt was educated on diet and encouraged to trial the mediterranean diet and plate method. Pt was also educated on exercise to start small, even is that means leg/arm raises in a stationary position, with the ultimate goal of 150 minutes of moderate intensity exercise weekly. Pt will return to clinic for hepatic panel, lipid panel, and ApoB in 2 months.  Thank you for allowing pharmacy to participate in this patient's care.  Reatha Harps,  PharmD PGY2 Pharmacy Resident 05/28/2022 2:24 PM Check [email or web address] for unit specific pharmacy number

## 2022-05-28 NOTE — Patient Instructions (Signed)
Start rosuvastatin (Crestor) 5 mg tablet every Monday, Wednesday, and Friday. May increase to every day if tolerating after 1 week  Return fasting for labs in 2 months for cholesterol and liver enzyme check

## 2022-06-01 DIAGNOSIS — G720 Drug-induced myopathy: Secondary | ICD-10-CM | POA: Insufficient documentation

## 2022-06-04 ENCOUNTER — Ambulatory Visit: Payer: Medicare Other

## 2022-06-28 DIAGNOSIS — S90455A Superficial foreign body, left lesser toe(s), initial encounter: Secondary | ICD-10-CM | POA: Diagnosis not present

## 2022-06-28 DIAGNOSIS — Z01419 Encounter for gynecological examination (general) (routine) without abnormal findings: Secondary | ICD-10-CM | POA: Diagnosis not present

## 2022-06-28 DIAGNOSIS — B372 Candidiasis of skin and nail: Secondary | ICD-10-CM | POA: Diagnosis not present

## 2022-07-11 DIAGNOSIS — M0579 Rheumatoid arthritis with rheumatoid factor of multiple sites without organ or systems involvement: Secondary | ICD-10-CM | POA: Diagnosis not present

## 2022-07-11 DIAGNOSIS — Z79899 Other long term (current) drug therapy: Secondary | ICD-10-CM | POA: Diagnosis not present

## 2022-07-17 DIAGNOSIS — I10 Essential (primary) hypertension: Secondary | ICD-10-CM | POA: Diagnosis not present

## 2022-07-17 DIAGNOSIS — E11319 Type 2 diabetes mellitus with unspecified diabetic retinopathy without macular edema: Secondary | ICD-10-CM | POA: Diagnosis not present

## 2022-07-17 DIAGNOSIS — J849 Interstitial pulmonary disease, unspecified: Secondary | ICD-10-CM | POA: Diagnosis not present

## 2022-07-17 DIAGNOSIS — E782 Mixed hyperlipidemia: Secondary | ICD-10-CM | POA: Diagnosis not present

## 2022-08-15 ENCOUNTER — Telehealth: Payer: Self-pay

## 2022-08-15 ENCOUNTER — Ambulatory Visit: Payer: Medicare Other

## 2022-08-15 ENCOUNTER — Other Ambulatory Visit: Payer: Self-pay | Admitting: *Deleted

## 2022-08-15 ENCOUNTER — Other Ambulatory Visit: Payer: Self-pay

## 2022-08-15 ENCOUNTER — Other Ambulatory Visit
Admission: RE | Admit: 2022-08-15 | Discharge: 2022-08-15 | Disposition: A | Discharge status: Home / Self Care | Payer: Medicare Other | Source: Ambulatory Visit | Attending: Internal Medicine | Admitting: Internal Medicine

## 2022-08-15 DIAGNOSIS — E782 Mixed hyperlipidemia: Secondary | ICD-10-CM

## 2022-08-15 DIAGNOSIS — Z79899 Other long term (current) drug therapy: Secondary | ICD-10-CM

## 2022-08-15 LAB — LIPID PANEL
Cholesterol: 111 mg/dL (ref 0–200)
HDL: 61 mg/dL (ref >40)
LDL Cholesterol: 31 mg/dL (ref 0–99)
Total CHOL/HDL Ratio: 1.8 RATIO
Triglycerides: 94 mg/dL (ref <150)
VLDL: 19 mg/dL (ref 0–40)

## 2022-08-15 LAB — HEPATIC FUNCTION PANEL
ALT: 16 U/L (ref 0–44)
AST: 30 U/L (ref 15–41)
Albumin: 3.8 g/dL (ref 3.5–5.0)
Alkaline Phosphatase: 81 U/L (ref 38–126)
Bilirubin, Direct: 0.2 mg/dL (ref 0.0–0.2)
Indirect Bilirubin: 0.8 mg/dL (ref 0.3–0.9)
Total Bilirubin: 1 mg/dL (ref 0.3–1.2)
Total Protein: 6.5 g/dL (ref 6.5–8.1)

## 2022-08-15 NOTE — Addendum Note (Signed)
Addended by: Stephani Police on: 08/15/2022 03:39 PM   Modules accepted: Orders

## 2022-08-15 NOTE — Addendum Note (Signed)
Addended by: Stephani Police on: 08/15/2022 03:28 PM   Modules accepted: Orders

## 2022-08-15 NOTE — Telephone Encounter (Signed)
[facility] lab called me to ask for orders to be put io for the Lipid and hepatic panels on the pt... I also advised her that we need an APO B per the Pharmacist/ Lipid clinic note... she says she will look into it but may not have the proper tube for the order.

## 2022-08-16 LAB — APOLIPOPROTEIN B: Apolipoprotein B: 39 mg/dL (ref <90)

## 2022-08-17 ENCOUNTER — Telehealth (HOSPITAL_COMMUNITY): Payer: Self-pay | Admitting: Pharmacist

## 2022-08-17 NOTE — Telephone Encounter (Signed)
Went over lipid and hepatic panels with patient. Labs look great on rosuvastatin. She is taking it daily with only minor changes in her baseline join pain. She was encouraged to keep up the great work and follow up with Dr. Harrington Challenger as scheduled.

## 2022-11-01 DIAGNOSIS — M0579 Rheumatoid arthritis with rheumatoid factor of multiple sites without organ or systems involvement: Secondary | ICD-10-CM | POA: Diagnosis not present

## 2022-11-01 DIAGNOSIS — M2550 Pain in unspecified joint: Secondary | ICD-10-CM | POA: Diagnosis not present

## 2022-11-01 DIAGNOSIS — M1A09X0 Idiopathic chronic gout, multiple sites, without tophus (tophi): Secondary | ICD-10-CM | POA: Diagnosis not present

## 2022-11-01 DIAGNOSIS — R5382 Chronic fatigue, unspecified: Secondary | ICD-10-CM | POA: Diagnosis not present

## 2022-11-02 ENCOUNTER — Encounter: Payer: Self-pay | Admitting: Adult Health

## 2022-11-02 ENCOUNTER — Ambulatory Visit (INDEPENDENT_AMBULATORY_CARE_PROVIDER_SITE_OTHER)
Admission: RE | Admit: 2022-11-02 | Discharge: 2022-11-02 | Disposition: A | Discharge status: Home / Self Care | Payer: Medicare Other | Source: Ambulatory Visit | Attending: Adult Health | Admitting: Adult Health

## 2022-11-02 ENCOUNTER — Ambulatory Visit: Payer: Medicare Other | Admitting: Adult Health

## 2022-11-02 ENCOUNTER — Telehealth: Payer: Self-pay | Admitting: Internal Medicine

## 2022-11-02 VITALS — BP 128/66 | HR 125 | Temp 98.5°F | Ht <= 58 in | Wt 155.8 lb

## 2022-11-02 DIAGNOSIS — J8489 Other specified interstitial pulmonary diseases: Secondary | ICD-10-CM

## 2022-11-02 DIAGNOSIS — J9621 Acute and chronic respiratory failure with hypoxia: Secondary | ICD-10-CM | POA: Diagnosis not present

## 2022-11-02 DIAGNOSIS — M359 Systemic involvement of connective tissue, unspecified: Secondary | ICD-10-CM | POA: Diagnosis not present

## 2022-11-02 DIAGNOSIS — R0609 Other forms of dyspnea: Secondary | ICD-10-CM

## 2022-11-02 DIAGNOSIS — J8410 Pulmonary fibrosis, unspecified: Secondary | ICD-10-CM | POA: Diagnosis not present

## 2022-11-02 DIAGNOSIS — K449 Diaphragmatic hernia without obstruction or gangrene: Secondary | ICD-10-CM | POA: Diagnosis not present

## 2022-11-02 DIAGNOSIS — J849 Interstitial pulmonary disease, unspecified: Secondary | ICD-10-CM

## 2022-11-02 DIAGNOSIS — R059 Cough, unspecified: Secondary | ICD-10-CM | POA: Diagnosis not present

## 2022-11-02 NOTE — Assessment & Plan Note (Signed)
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  However definitely last 6 weeks appear to be significant change in baseline.  Patient has severe hypoxemia on arrival today in the office.  I have recommended that she go to the emergency room for further evaluation as she has no oxygen at home and we do not know what the exact cause of what is contributing to this.  Potential for multiple issues in the differential.-Could have progressive ILD versus acute ILD flare versus acute pneumonitis (patient is on methotrexate) among multiple other etiology such as cardiac, VTE, anemia etc. patient declined adamantly despite long discussion with her and her husband regarding going to the emergency room.  I have also recommended getting lab work today.  Patient declines this as well.  Patient does agree to a chest x-ray.  Will start on oxygen and will return next week and consider labs at that time.  I have also recommended a high-resolution CT chest and patient has agreed to this as well.  Plan  Patient Instructions  Recommend that you go to ER for evaluation and treatment plan. Recommend labs , call back if you change your mind  Chest xray today  Begin Oxygen 3l/m , goal is for O2 level to be greater than >88-90%.  Set up for HRCT chest to evaluate ILD  Follow up in Office in 1 week with Dr. Chase Caller with Spirometry with DLCO  and As needed   Please contact office for sooner follow up if symptoms do not improve or worsen or seek emergency care

## 2022-11-02 NOTE — Telephone Encounter (Signed)
Called patient and left voicemail for her to call office back about her appointment on Tuesday  TP states it is ok to double book her 10am slot to see patient after spiro.

## 2022-11-02 NOTE — Patient Instructions (Signed)
Recommend that you go to ER for evaluation and treatment plan. Recommend labs , call back if you change your mind  Chest xray today  Begin Oxygen 3l/m , goal is for O2 level to be greater than >88-90%.  Set up for HRCT chest to evaluate ILD  Follow up in Office in 1 week with Dr. Chase Caller with Spirometry with DLCO  and As needed   Please contact office for sooner follow up if symptoms do not improve or worsen or seek emergency care

## 2022-11-02 NOTE — Progress Notes (Signed)
$'@Patient'b$  ID: Madison Holden, female    DOB: 04/03/1946, 77 y.o.   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  Last seen July 19, 2021.  Patient missed her recommended follow-up.  Patient says overall breathing has been gradually getting worse over the last year but definitely substantially raised shortness of breath over the last 6 weeks.  She is becoming more short of breath with activities.  Last visit patient was recommended begin oxygen with activity and at bedtime for known hypoxemia nocturnally and with activity. She declined oxygen last visit.  Today in the office on arrival O2 saturations were 74% with ambulation.  Patient required 3 L of oxygen to maintain O2 saturations greater than 88 to 90% she also has been noticing weight loss she is down 30 pounds since last visit.  She denies any hemoptysis, chest pain, orthopnea, edema. Patient has underlying rheumatoid arthritis and is on Enbrel and methotrexate  Says that over last 2 months that that is becoming harder for her to do things without becoming very short of breath.  She says she has to pant for air at times..   Allergies  Allergen Reactions   Lovastatin    Rosuvastatin Other (See Comments)    Immunization History  Administered Date(s) Administered   Moderna Sars-Covid-2 Vaccination 11/26/2019, 12/22/2019, 08/12/2020    Pneumococcal Conjugate-13 01/29/2017   Pneumococcal Polysaccharide-23 08/01/2012   Tdap 01/17/2011   Zoster, Live 05/15/2012    Past Medical History:  Diagnosis Date   Ambulates with cane    due to gout    Arthritis    hands   Chest pain, unspecified 07-01-2018  per pt had 2 day chest pain and sob  6 weeks ago,, per pt no symptoms since   cardiology-- dr Salley Scarlet--  scheduled for stress test and echo 07-03-2018 prior to surgery scheduled 07-08-2018   Diarrhea, unspecified    intermittant   DOE (dyspnea on exertion)    Endometrial cancer ([place])    Essential hypertension    Frequency of urination    GERD (gastroesophageal reflux disease)    Gout rheumologist-- michelle young PA ([facility])   07-01-2018 per pt flare up april 2019 , hands and wrists   Heart murmur    History of adenomatous polyp of colon    History of esophageal dilatation    for stricture   Mild nausea    per pt intermittant , no vomiting   Mixed dyslipidemia    Muscle weakness of extremity    bilateral upper and lower extremities due to gout   Poor appetite    per pt    Type 2 diabetes mellitus ([place])    followed by pcp   Urgency of urination    Urine frequency     Tobacco History: Social History   Tobacco Use  Smoking Status Never  Smokeless Tobacco Never   Counseling given: Not Answered  Outpatient Medications Prior to Visit  Medication Sig Dispense Refill   acetaminophen (TYLENOL) 650 MG CR tablet Take 650 mg by mouth every 8 (eight) hours as needed for pain.     allopurinol (ZYLOPRIM) 300 MG tablet Take 300 mg by mouth daily.     amLODipine (NORVASC) 2.5 MG tablet TAKE 1 TABLET BY MOUTH ONCE DAILY 90 tablet 3   ASA-APAP-Caff Buffered (VANQUISH PO) Take 2 tablets by mouth 2 (two) times a week.     B-D TB SYRINGE 1CC/27GX1/2" 27G X 1/2" 1 ML MISC USE WEEKLY WITH METHOTREXATE SUBCUTANEOUSLY 90 DAYS     Baclofen 5 MG TABS Take 0.5-1 tablets by mouth at bedtime as needed.      diphenhydrAMINE (BENADRYL) 25 MG tablet Take 25 mg by mouth at bedtime as needed for sleep.      esomeprazole (NEXIUM) 40 MG capsule Take 40 mg by mouth 3 (three) times a week.     etanercept (ENBREL) 50 MG/ML injection Inject 25 mg into the skin 2 (two) times a week.     Ferrous Gluconate 324 (37.5 Fe) MG TABS Take 1 tablet by mouth 2 (two) times daily.     folic acid (FOLVITE) 294 MCG tablet Take 800 mcg by mouth daily.     hydrochlorothiazide (HYDRODIURIL) 25 MG tablet Take 12.5 mg by mouth daily.     ibuprofen (ADVIL) 200 MG tablet      ketoconazole (NIZORAL) 2 % cream SMARTSIG:sparingly Topical Twice Daily     Magnesium 500 MG TABS Take 500 mg by mouth daily.     Melatonin 3 MG TABS Take 3-6 mg by mouth at bedtime as needed (sleep).      methotrexate 250 MG/10ML injection once a week. 0.5 ml     oxybutynin (DITROPAN) 5 MG tablet Take 5 mg by mouth 2 (two) times daily.     pioglitazone (ACTOS) 30 MG tablet Take 30 mg by mouth daily.      rosuvastatin (CRESTOR) 5 MG tablet Take 1 tablet (5 mg total) by mouth daily. Start with one tablet every Monday, Wednesday, and Friday. Increase to one tablet daily if tolerating well. 90 tablet 3   Turmeric 500 MG CAPS Take 500 mg by mouth daily.     vitamin B-12 (CYANOCOBALAMIN) 1000 MCG tablet Take 1,000 mcg by mouth daily.     vitamin C (ASCORBIC ACID) 500 MG tablet Take 500 mg by mouth daily.     ciclesonide (ALVESCO) 80 MCG/ACT inhaler Inhale 1 puff into the lungs 2 (two) times daily. (Patient not taking: Reported on 04/18/2022) 1 each 0   esomeprazole (NEXIUM) 40 MG capsule Take 40 mg by mouth 3 (three) times a week. (Patient not taking: Reported on 04/18/2022)     No facility-administered medications prior to visit.     Review of Systems:   Constitutional:   No  weight loss, night sweats,  Fevers, chills, + fatigue, or  lassitude.  HEENT:   No headaches,  Difficulty swallowing,  Tooth/dental problems, or  Sore throat,                No  sneezing, itching, ear ache, nasal congestion, post nasal drip,   CV:  No chest pain,  Orthopnea, PND, swelling in lower extremities, anasarca, dizziness, palpitations, syncope.   GI  No heartburn, indigestion, abdominal pain, nausea, vomiting, diarrhea, change in bowel habits, loss of appetite, bloody stools.   Resp:   No chest wall deformity  Skin: no rash or lesions.  GU: no dysuria, change in color of urine, no urgency or frequency.  No flank pain, no hematuria   MS:  No joint pain or swelling.  No decreased range of motion.  Joint pains.     Physical Exam  BP 128/66 (BP Location: Left Arm, Patient Position: Sitting, Cuff Size: Normal)   Pulse (!) 125   Temp 98.5 F (36.9 C) (Oral)   Ht '4\' 10"'$  (1.473 m)   Wt 155 lb 12.8 oz (70.7 kg)   SpO2 98% Comment: placed on 3L [place]  BMI 32.56 kg/m   GEN: A/Ox3; pleasant , NAD, well nourished, rolling walker.    HEENT:  [place]/AT,  EACs-clear, TMs-wnl, NOSE-clear, THROAT-clear, no lesions, no postnasal drip or exudate noted.   NECK:  Supple w/ fair ROM; no JVD; normal carotid impulses w/o bruits; no thyromegaly or nodules palpated; no lymphadenopathy.    RESP bibasilar crackles  no accessory muscle use, no dullness to percussion  CARD:  RRR, no m/r/g, tr  peripheral edema, pulses intact, no cyanosis or clubbing.  GI:   Soft & nt; nml bowel sounds; no organomegaly or masses detected.   Musco: Warm bil, arthritic changes in hands.   Neuro: alert, no focal deficits noted.    Skin: Warm, no lesions or rashes    Lab Results:     BNP No results found for: "BNP"  ProBNP No results found for: "PROBNP"  Imaging: No results found.       Latest Ref Rng & Units 05/24/2021   10:58 AM  PFT Results  FVC-Pre L 1.57   FVC-Predicted Pre % 67   FVC-Post L 1.57   FVC-Predicted Post % 66   Pre FEV1/FVC % % 93   Post FEV1/FCV % % 94   FEV1-Pre L 1.47   FEV1-Predicted Pre % 83   FEV1-Post L 1.46   DLCO uncorrected ml/min/mmHg  7.67   DLCO UNC% % 47   DLCO corrected ml/min/mmHg 7.67   DLCO COR %Predicted % 47   DLVA Predicted % 65   TLC L 2.73   TLC % Predicted % 63   RV % Predicted % 50     No results found for: "NITRICOXIDE"      Assessment & Plan:   ILD (interstitial lung disease) ([place]) Connective tissue disease related ILD-with acute decompensation questionable etiology-does have some gradual symptoms over the last year.  However definitely last 6 weeks appear to be significant change in baseline.  Patient has severe hypoxemia on arrival today in the office.  I have recommended that she go to the emergency room for further evaluation as she has no oxygen at home and we do not know what the exact cause of what is contributing to this.  Potential for multiple issues in the differential.-Could have progressive ILD versus acute ILD flare versus acute pneumonitis (patient is on methotrexate) among multiple other etiology such as cardiac, VTE, anemia etc. patient declined adamantly despite long discussion with her and her husband regarding going to the emergency room.  I have also recommended getting lab work today.  Patient declines this as well.  Patient does agree to a chest x-ray.  Will start on oxygen and will return next week and consider labs at that time.  I have also recommended a high-resolution CT chest and patient has agreed to this as well.  Plan  Patient Instructions  Recommend that you go to ER for evaluation and treatment plan. Recommend labs , call back if you change  your mind  Chest xray today  Begin Oxygen 3l/m , goal is for O2 level to be greater than >88-90%.  Set up for HRCT chest to evaluate ILD  Follow up in Office in 1 week with Dr. Chase Caller with Spirometry with DLCO  and As needed   Please contact office for sooner follow up if symptoms do not improve or worsen or seek emergency care      Acute on chronic respiratory failure with hypoxemia [facility]) Patient has known exertional and  nocturnal hypoxemia refused oxygen last visit in 2022.  Unfortunately have not seen her since 2022.  Now with significant change in baseline and very hypoxic on arrival to the office today.  She was able to get O2 saturations greater than 88 to 90% on 3 L of oxygen.  We discharged her from the office with a portable concentrator.  We have seen in urgent order to local DME to begin oxygen at home.  Advised her to get a pulse oximeter to monitor her oxygen levels at home.  Have recommend that she go to the emergency room for further evaluation but patient declines  Plan  Patient Instructions  Recommend that you go to ER for evaluation and treatment plan. Recommend labs , call back if you change your mind  Chest xray today  Begin Oxygen 3l/m , goal is for O2 level to be greater than >88-90%.  Set up for HRCT chest to evaluate ILD  Follow up in Office in 1 week with Dr. Chase Caller with Spirometry with DLCO  and As needed   Please contact office for sooner follow up if symptoms do not improve or worsen or seek emergency care      I spent  42  minutes dedicated to the care of this patient on the date of this encounter to include pre-visit review of records, face-to-face time with the patient discussing conditions above, post visit ordering of testing, clinical documentation with the electronic health record, making appropriate referrals as documented, and communicating necessary findings to members of the patients care team.    Rexene Edison, NP 11/02/2022

## 2022-11-02 NOTE — Assessment & Plan Note (Signed)
Patient has known exertional and nocturnal hypoxemia refused oxygen last visit in 2022.  Unfortunately have not seen her since 2022.  Now with significant change in baseline and very hypoxic on arrival to the office today.  She was able to get O2 saturations greater than 88 to 90% on 3 L of oxygen.  We discharged her from the office with a portable concentrator.  We have seen in urgent order to local DME to begin oxygen at home.  Advised her to get a pulse oximeter to monitor her oxygen levels at home.  Have recommend that she go to the emergency room for further evaluation but patient declines  Plan  Patient Instructions  Recommend that you go to ER for evaluation and treatment plan. Recommend labs , call back if you change your mind  Chest xray today  Begin Oxygen 3l/m , goal is for O2 level to be greater than >88-90%.  Set up for HRCT chest to evaluate ILD  Follow up in Office in 1 week with Dr. Chase Caller with Spirometry with DLCO  and As needed   Please contact office for sooner follow up if symptoms do not improve or worsen or seek emergency care

## 2022-11-05 ENCOUNTER — Telehealth: Payer: Self-pay | Admitting: *Deleted

## 2022-11-05 ENCOUNTER — Other Ambulatory Visit: Payer: Self-pay | Admitting: *Deleted

## 2022-11-05 DIAGNOSIS — M359 Systemic involvement of connective tissue, unspecified: Secondary | ICD-10-CM

## 2022-11-05 MED ORDER — AMOXICILLIN-POT CLAVULANATE 875-125 MG PO TABS: 1.0000 | ORAL_TABLET | Freq: Two times a day (BID) | ORAL | 0 refills | Status: DC

## 2022-11-05 NOTE — Telephone Encounter (Signed)
Spoke with pt and confirmed that she would be seen by Rexene Edison on 11/12/22 after PFT. Pt stated understanding. Nothing further needed at this time.

## 2022-11-05 NOTE — Progress Notes (Signed)
Called and spoke with patient, advised of results/recommendations per Rexene Edison NP.  She verbalized understanding.  Verified pharmacy and script sent.  Scheduled 4 wk f/u for 2/15 at 10:30 am, advised to arrive a little before 10:15 am for CXR and check in.  She verbalized understanding.  Nothing further needed.

## 2022-11-05 NOTE — Telephone Encounter (Signed)
ATC Madison Holden with [employer] regarding order for oxygen that was ordered on Friday 11/02/22.  Advised that she was sent home with a TOC, however was trying to get an update on when she would receive her oxygen equipment.  Will await return call.

## 2022-11-05 NOTE — Telephone Encounter (Signed)
That is fine with me can double book after PFT  Can we check with [employer] on status of O2

## 2022-11-05 NOTE — Telephone Encounter (Signed)
Called and spoke with patient. She stated that she had to reschedule her PFT for 01/22 at 4pm. She wanted to know if there was any way possible she could be seen after her spiro. She is aware that I will send a message to TP to check since our last patient OV is scheduled for 4pm.   While on the phone, she also stated that she has not heard anything from [employer] in regards to her O2. She did state that she was sent home from our office with a concentrator and she is ok at the moment. I advised her that I would call [employer] to check on the status of her order.   Called [employer] and spoke with Denay. She confirmed that they had received the order on Friday but it was not processed correctly. She stated that she will make sure that the patient receives her O2 today. She will have dispatch to call the patient to setup a delivery time for this morning.   TP, can you please advise on the OV on 01/22 mentioned above? Thanks!

## 2022-11-07 ENCOUNTER — Telehealth: Payer: Self-pay | Admitting: Internal Medicine

## 2022-11-07 ENCOUNTER — Other Ambulatory Visit (INDEPENDENT_AMBULATORY_CARE_PROVIDER_SITE_OTHER): Payer: Medicare Other

## 2022-11-07 DIAGNOSIS — R0609 Other forms of dyspnea: Secondary | ICD-10-CM | POA: Diagnosis not present

## 2022-11-07 DIAGNOSIS — C541 Malignant neoplasm of endometrium: Secondary | ICD-10-CM | POA: Diagnosis not present

## 2022-11-07 DIAGNOSIS — J8489 Other specified interstitial pulmonary diseases: Secondary | ICD-10-CM | POA: Diagnosis not present

## 2022-11-07 DIAGNOSIS — J8417 Interstitial lung disease with progressive fibrotic phenotype in diseases classified elsewhere: Secondary | ICD-10-CM | POA: Diagnosis not present

## 2022-11-07 DIAGNOSIS — N179 Acute kidney failure, unspecified: Secondary | ICD-10-CM | POA: Diagnosis not present

## 2022-11-07 DIAGNOSIS — M359 Systemic involvement of connective tissue, unspecified: Secondary | ICD-10-CM

## 2022-11-07 LAB — CBC WITH DIFFERENTIAL/PLATELET
Basophils Absolute: 0 10*3/uL (ref 0.0–0.1)
Basophils Relative: 0.6 % (ref 0.0–3.0)
Eosinophils Absolute: 0.5 10*3/uL (ref 0.0–0.7)
Eosinophils Relative: 6.6 % — ABNORMAL HIGH (ref 0.0–5.0)
HCT: 36.6 % (ref 36.0–46.0)
Hemoglobin: 12 g/dL (ref 12.0–15.0)
Lymphocytes Relative: 12.7 % (ref 12.0–46.0)
Lymphs Abs: 1 10*3/uL (ref 0.7–4.0)
MCHC: 32.8 g/dL (ref 30.0–36.0)
MCV: 104.3 fl — ABNORMAL HIGH (ref 78.0–100.0)
Monocytes Absolute: 0.1 10*3/uL (ref 0.1–1.0)
Monocytes Relative: 1.9 % — ABNORMAL LOW (ref 3.0–12.0)
Neutro Abs: 6.2 10*3/uL (ref 1.4–7.7)
Neutrophils Relative %: 78.2 % — ABNORMAL HIGH (ref 43.0–77.0)
Platelets: 213 10*3/uL (ref 150.0–400.0)
RBC: 3.51 Mil/uL — ABNORMAL LOW (ref 3.87–5.11)
RDW: 18.2 % — ABNORMAL HIGH (ref 11.5–15.5)
WBC: 8 10*3/uL (ref 4.0–10.5)

## 2022-11-07 LAB — BASIC METABOLIC PANEL
BUN: 29 mg/dL — ABNORMAL HIGH (ref 6–23)
CO2: 24 mEq/L (ref 19–32)
Calcium: 9.1 mg/dL (ref 8.4–10.5)
Chloride: 100 mEq/L (ref 96–112)
Creatinine, Ser: 0.91 mg/dL (ref 0.40–1.20)
GFR: 62.35 mL/min (ref >60.00)
Glucose, Bld: 96 mg/dL (ref 70–99)
Potassium: 3.5 mEq/L (ref 3.5–5.1)
Sodium: 133 mEq/L — ABNORMAL LOW (ref 135–145)

## 2022-11-07 LAB — BRAIN NATRIURETIC PEPTIDE: Pro B Natriuretic peptide (BNP): 546 pg/mL — ABNORMAL HIGH (ref 0.0–100.0)

## 2022-11-07 LAB — D-DIMER, QUANTITATIVE: D-Dimer, Quant: 0.55 mcg/mL FEU — ABNORMAL HIGH (ref <0.50)

## 2022-11-07 NOTE — Telephone Encounter (Signed)
ATC X1 LVM for patient to call us back

## 2022-11-07 NOTE — Telephone Encounter (Signed)
Patient is returning phone call. Patient phone number is [phone].

## 2022-11-08 ENCOUNTER — Other Ambulatory Visit: Payer: Self-pay | Admitting: *Deleted

## 2022-11-08 DIAGNOSIS — R7989 Other specified abnormal findings of blood chemistry: Secondary | ICD-10-CM

## 2022-11-08 NOTE — Telephone Encounter (Signed)
Forwarding to [employer]

## 2022-11-08 NOTE — Telephone Encounter (Signed)
See lab note.  

## 2022-11-08 NOTE — Telephone Encounter (Signed)
Patient is returning a call that she missed.  Stated she will try to stay by the phone for a call back.  [phone]

## 2022-11-08 NOTE — Telephone Encounter (Signed)
Called and spoke with patient. She is requesting results from her recent lab work. Also she states she has pneumonia and is wondering when it will be okay to be around people. Dr. Chase Caller please advise?

## 2022-11-09 ENCOUNTER — Inpatient Hospital Stay: Admission: RE | Admit: 2022-11-09 | Payer: Medicare Other | Source: Ambulatory Visit

## 2022-11-12 ENCOUNTER — Ambulatory Visit: Payer: Medicare Other | Admitting: Adult Health

## 2022-11-12 ENCOUNTER — Encounter: Payer: Self-pay | Admitting: Adult Health

## 2022-11-12 ENCOUNTER — Ambulatory Visit (HOSPITAL_BASED_OUTPATIENT_CLINIC_OR_DEPARTMENT_OTHER): Payer: Medicare Other

## 2022-11-12 ENCOUNTER — Ambulatory Visit
Admission: RE | Admit: 2022-11-12 | Discharge: 2022-11-12 | Disposition: A | Discharge status: Home / Self Care | Payer: Medicare Other | Source: Ambulatory Visit | Attending: Adult Health | Admitting: Adult Health

## 2022-11-12 VITALS — BP 140/76 | HR 97 | Temp 98.1°F | Ht <= 58 in | Wt 161.6 lb

## 2022-11-12 DIAGNOSIS — J849 Interstitial pulmonary disease, unspecified: Secondary | ICD-10-CM | POA: Diagnosis not present

## 2022-11-12 DIAGNOSIS — J9611 Chronic respiratory failure with hypoxia: Secondary | ICD-10-CM

## 2022-11-12 DIAGNOSIS — I270 Primary pulmonary hypertension: Secondary | ICD-10-CM

## 2022-11-12 DIAGNOSIS — Z8542 Personal history of malignant neoplasm of other parts of uterus: Secondary | ICD-10-CM | POA: Diagnosis not present

## 2022-11-12 DIAGNOSIS — R7989 Other specified abnormal findings of blood chemistry: Secondary | ICD-10-CM | POA: Diagnosis not present

## 2022-11-12 DIAGNOSIS — M359 Systemic involvement of connective tissue, unspecified: Secondary | ICD-10-CM

## 2022-11-12 DIAGNOSIS — K449 Diaphragmatic hernia without obstruction or gangrene: Secondary | ICD-10-CM

## 2022-11-12 DIAGNOSIS — R9389 Abnormal findings on diagnostic imaging of other specified body structures: Secondary | ICD-10-CM

## 2022-11-12 DIAGNOSIS — I7789 Other specified disorders of arteries and arterioles: Secondary | ICD-10-CM | POA: Diagnosis not present

## 2022-11-12 MED ORDER — IOPAMIDOL (ISOVUE-370) INJECTION 76%
75.0000 mL | Freq: Once | INTRAVENOUS | Status: AC | PRN
  Administered 2022-11-12: 75 mL via INTRAVENOUS

## 2022-11-12 NOTE — Patient Instructions (Addendum)
Continue on  Oxygen 3l/m , goal is for O2 level to be greater than >88-90%.  POC evaluation this week as planned at [facility].  Finish up Augmentin Refer to GI.  2 D echo in 3 months  Set up for HRCT chest in 3 months.  Follow up in 3 months with  Dr. Chase Caller with Spirometry with DLCO  and As needed   Please contact office for sooner follow up if symptoms do not improve or worsen or seek emergency care

## 2022-11-12 NOTE — Progress Notes (Signed)
$'@Patient'K$  ID: Madison Holden, female    DOB: 26-Dec-1945, 77 y.o.   MRN: [id number]  Chief Complaint  Patient presents with   Follow-up    Referring provider: Fay Records, MD  HPI: 77 year old female never smoker followed for connective tissue disease related ILD and chronic respiratory failure  TEST/EVENTS :  CT chest April 25, 2021 showed peripheral and basilar predominant subpleural reticulation, groundglass and traction bronchiectasis.,  Unchanged from most likely due to UIP, chronically enlarged mediastinal and hilar lymph nodes   2D echo June 20, 2021 showed EF at 60 to 65%, moderate LVH, RV size normal, RV SF normal  11/12/2022 Follow up : CTD related ILD, chronic respiratory failure Patient returns for a 1 week follow-up.  Patient was seen last visit with a progressive clinical decline.  She had increased shortness of breath with decreased activity tolerance.  Notable exertional desaturations.  She was treated for a possible ILD flare plus or minus acute bronchitis.  She was given Augmentin x 7 days..  She was started on oxygen 3 L.  She was recommended for hospitalization but declined.  Lab work did show an elevated D-dimer.  Subsequent CT chest that was completed today showed diffuse subpleural reticulation, groundglass that appears to be stable.  Negative PE, nodularity of the right upper lobe measuring 7 x 6 mm appears stable dating back to June 2022, large hiatal hernia, nodular area of increased density near the gastroesophageal junction.  We discussed her CT scan in detail.  Since last visit patient says she is feeling much better.  Oxygen is really helping her.  Feels that she can have increased activity tolerance now.  She does have some mild nasal congestion and cough.  She has 2 days left of Augmentin. Patient says she does have some mild intermittent dysphagia.   She denies any hemoptysis, fever, or discolored mucus.  She is going to DME company this week to see if she  qualifies for portable oxygen concentrator.  Allergies  Allergen Reactions   Lovastatin    Rosuvastatin Other (See Comments)    Immunization History  Administered Date(s) Administered   Moderna Sars-Covid-2 Vaccination 11/26/2019, 12/22/2019, 08/12/2020   Pneumococcal Conjugate-13 01/29/2017   Pneumococcal Polysaccharide-23 08/01/2012   Tdap 01/17/2011   Zoster, Live 05/15/2012    Past Medical History:  Diagnosis Date   Ambulates with cane    due to gout    Arthritis    hands   Chest pain, unspecified 07-01-2018  per pt had 2 day chest pain and sob  6 weeks ago,, per pt no symptoms since   cardiology-- dr Salley Scarlet--  scheduled for stress test and echo 07-03-2018 prior to surgery scheduled 07-08-2018   Diarrhea, unspecified    intermittant   DOE (dyspnea on exertion)    Endometrial cancer ([place])    Essential hypertension    Frequency of urination    GERD (gastroesophageal reflux disease)    Gout rheumologist-- michelle young PA ([facility])   07-01-2018 per pt flare up april 2019 , hands and wrists   Heart murmur    History of adenomatous polyp of colon    History of esophageal dilatation    for stricture   Mild nausea    per pt intermittant , no vomiting   Mixed dyslipidemia    Muscle weakness of extremity    bilateral upper and lower extremities due to gout   Poor appetite    per pt    Type 2 diabetes  mellitus ([place])    followed by pcp   Urgency of urination    Urine frequency     Tobacco History: Social History   Tobacco Use  Smoking Status Never  Smokeless Tobacco Never   Counseling given: Not Answered   Outpatient Medications Prior to Visit  Medication Sig Dispense Refill   acetaminophen (TYLENOL) 650 MG CR tablet Take 650 mg by mouth every 8 (eight) hours as needed for pain.     allopurinol (ZYLOPRIM) 300 MG tablet Take 300 mg by mouth daily.     amLODipine (NORVASC) 2.5 MG tablet TAKE 1 TABLET BY MOUTH ONCE DAILY 90 tablet 3    amoxicillin-clavulanate (AUGMENTIN) 875-125 MG tablet Take 1 tablet by mouth 2 (two) times daily. 14 tablet 0   ASA-APAP-Caff Buffered (VANQUISH PO) Take 2 tablets by mouth 2 (two) times a week.     B-D TB SYRINGE 1CC/27GX1/2" 27G X 1/2" 1 ML MISC USE WEEKLY WITH METHOTREXATE SUBCUTANEOUSLY 90 DAYS     Baclofen 5 MG TABS Take 0.5-1 tablets by mouth at bedtime as needed.     diphenhydrAMINE (BENADRYL) 25 MG tablet Take 25 mg by mouth at bedtime as needed for sleep.      esomeprazole (NEXIUM) 40 MG capsule Take 40 mg by mouth 3 (three) times a week.     etanercept (ENBREL) 50 MG/ML injection Inject 25 mg into the skin 2 (two) times a week.     Ferrous Gluconate 324 (37.5 Fe) MG TABS Take 1 tablet by mouth 2 (two) times daily.     folic acid (FOLVITE) 923 MCG tablet Take 800 mcg by mouth daily.     hydrochlorothiazide (HYDRODIURIL) 25 MG tablet Take 12.5 mg by mouth daily.     ibuprofen (ADVIL) 200 MG tablet      ketoconazole (NIZORAL) 2 % cream SMARTSIG:sparingly Topical Twice Daily     Magnesium 500 MG TABS Take 500 mg by mouth daily.     Melatonin 3 MG TABS Take 3-6 mg by mouth at bedtime as needed (sleep).      methotrexate 250 MG/10ML injection once a week. 0.5 ml     oxybutynin (DITROPAN) 5 MG tablet Take 5 mg by mouth 2 (two) times daily.     pioglitazone (ACTOS) 30 MG tablet Take 30 mg by mouth daily.      rosuvastatin (CRESTOR) 5 MG tablet Take 1 tablet (5 mg total) by mouth daily. Start with one tablet every Monday, Wednesday, and Friday. Increase to one tablet daily if tolerating well. 90 tablet 3   Turmeric 500 MG CAPS Take 500 mg by mouth daily.     vitamin B-12 (CYANOCOBALAMIN) 1000 MCG tablet Take 1,000 mcg by mouth daily.     vitamin C (ASCORBIC ACID) 500 MG tablet Take 500 mg by mouth daily.     No facility-administered medications prior to visit.     Review of Systems:   Constitutional:   No  weight loss, night sweats,  Fevers, chills,  +fatigue, or  lassitude.  HEENT:    No headaches,  Difficulty swallowing,  Tooth/dental problems, or  Sore throat,                No sneezing, itching, ear ache, nasal congestion, post nasal drip,   CV:  No chest pain,  Orthopnea, PND, swelling in lower extremities, anasarca, dizziness, palpitations, syncope.   GI  No heartburn, indigestion, abdominal pain, nausea, vomiting, diarrhea, change in bowel habits, loss of appetite, bloody stools.  Resp: .  No chest wall deformity  Skin: no rash or lesions.  GU: no dysuria, change in color of urine, no urgency or frequency.  No flank pain, no hematuria   MS:  No joint pain or swelling.  No decreased range of motion.  No back pain.    Physical Exam  BP (!) 140/76   Pulse 97   Temp 98.1 F (36.7 C) (Oral)   Ht '4\' 10"'$  (1.473 m)   Wt 161 lb 9.6 oz (73.3 kg)   SpO2 93%   BMI 33.77 kg/m   GEN: A/Ox3; pleasant , NAD, well nourished, chronically ill-appearing, oxygen   HEENT:  [place]/AT,  NOSE-clear, THROAT-clear, no lesions, no postnasal drip or exudate noted.   NECK:  Supple w/ fair ROM; no JVD; normal carotid impulses w/o bruits; no thyromegaly or nodules palpated; no lymphadenopathy.    RESP bibasilar crackles no accessory muscle use, no dullness to percussion  CARD:  RRR, no m/r/g, tr  peripheral edema, pulses intact, no cyanosis or clubbing.  GI:   Soft & nt; nml bowel sounds; no organomegaly or masses detected.   Musco: Warm bil, no deformities or joint swelling noted.   Neuro: alert, no focal deficits noted.    Skin: Warm, no lesions or rashes    Lab Results:  CBC    Component Value Date/Time   WBC 8.0 11/07/2022 1125   RBC 3.51 (L) 11/07/2022 1125   HGB 12.0 11/07/2022 1125   HCT 36.6 11/07/2022 1125   PLT 213.0 11/07/2022 1125   MCV 104.3 (H) 11/07/2022 1125   MCH 27.4 07/23/2018 0341   MCHC 32.8 11/07/2022 1125   RDW 18.2 (H) 11/07/2022 1125   LYMPHSABS 1.0 11/07/2022 1125   MONOABS 0.1 11/07/2022 1125   EOSABS 0.5 11/07/2022 1125   BASOSABS  0.0 11/07/2022 1125    BMET    Component Value Date/Time   NA 133 (L) 11/07/2022 1125   K 3.5 11/07/2022 1125   CL 100 11/07/2022 1125   CO2 24 11/07/2022 1125   GLUCOSE 96 11/07/2022 1125   BUN 29 (H) 11/07/2022 1125   CREATININE 0.91 11/07/2022 1125   CALCIUM 9.1 11/07/2022 1125   GFRNONAA 60 (L) 07/23/2018 0341   GFRAA >60 07/23/2018 0341    BNP No results found for: "BNP"  ProBNP    Component Value Date/Time   PROBNP 546.0 (H) 11/07/2022 1125    Imaging: CT Angio Chest Pulmonary Embolism (PE) W or WO Contrast  Result Date: 11/12/2022 CLINICAL DATA:  Positive D-dimer in a 77 year old female, concern for pulmonary embolism in the setting of endometrial cancer in a patient with shortness of breath. * Tracking Code: BO * EXAM: CT ANGIOGRAPHY CHEST WITH CONTRAST TECHNIQUE: Multidetector CT imaging of the chest was performed using the standard protocol during bolus administration of intravenous contrast. Multiplanar CT image reconstructions and MIPs were obtained to evaluate the vascular anatomy. RADIATION DOSE REDUCTION: This exam was performed according to the departmental dose-optimization program which includes automated exposure control, adjustment of the mA and/or kV according to patient size and/or use of iterative reconstruction technique. CONTRAST:  54m ISOVUE-370 IOPAMIDOL (ISOVUE-370) INJECTION 76% COMPARISON:  April 25, 2021. FINDINGS: Cardiovascular: Main pulmonary artery is well opacified measuring 4.4 cm greatest axial dimension and showing a density of 541 Hounsfield units. There is no evidence of pulmonary embolism. Calcified and noncalcified plaque of a nonaneurysmal thoracic aorta. Aorta with smooth contours. Tortuous great vessels in the chest. Heart size mildly enlarged. No pericardial  effusion. Mitral annular calcifications. Coronary artery calcifications of LEFT coronary circulation. Mediastinum/Nodes: Very large hiatal hernia. In the distal esophagus there is an  area of added density measuring 13 x 15 mm (image 97/4) this is surrounded by an area of even more dense material. No perigastric stranding. No signs of supraclavicular, axillary or mediastinal lymphadenopathy. Lungs/Pleura: Diffuse subpleural reticulation, ground-glass and interstitial prominence that favor subpleural lung. Pattern is very similar to prior imaging and is not associated with new areas of consolidative change, pneumothorax or pleural effusion. There is an area of nodularity along bronchovascular structures in the RIGHT upper lobe (image 38/12 this measures approximately 7 x 6 mm and may be contiguous with thickening of bronchovascular structures extending from the RIGHT hilum. This more focal nodular appearance is different than other areas in the chest and appears stable however dating back to June of 2022 measured in retrospect by this observer on the prior study. Upper Abdomen: Incidental imaging of upper abdominal contents shows no acute process on limited assessment. Stomach is nearly entirely herniated into the chest. This is predominantly paraesophageal herniation but shows small sliding component. Musculoskeletal: No chest wall abnormality. No acute or significant osseous findings. Spinal degenerative changes are similar to previous imaging. Review of the MIP images confirms the above findings. IMPRESSION: 1. Negative for pulmonary embolism. 2. Enlargement of the main pulmonary artery compatible with pulmonary arterial hypertension in the setting of interstitial lung disease. 3. Large hiatal hernia. Nodular area of increased density near the gastroesophageal junction surrounded by greater area of density. This could represent ingested material. Would however correlate with any evidence of gastrointestinal bleeding and also with recent medication ingestion that could account for these findings. The nodular appearance is moderately concerning and dedicated esophageal assessment may be warranted  to exclude underlying lesion in this location. 4. Stable nodular area along bronchovascular structures in the RIGHT upper lobe may simply reflect confluent interstitial fibrosis but would suggest close attention on follow-up. Consider follow-up at 6 months from the current study to ensure continued stability since July of 2022. Could consider evaluation with interstitial lung disease, high-resolution protocol for further assessment as warranted at that time or as based on clinical symptoms. 5. Aortic atherosclerosis. Aortic Atherosclerosis (ICD10-I70.0). These results were called by telephone at the time of interpretation on 11/12/2022 at 1:43 pm to provider [facility] Isiaha Greenup , who verbally acknowledged these results. Electronically Signed   By: Zetta Bills M.D.   On: 11/12/2022 13:43   DG Chest 2 View  Result Date: 11/05/2022 CLINICAL DATA:  Cough and low oxygen. History of pulmonary fibrosis. EXAM: CHEST - 2 VIEW COMPARISON:  June 27, 2021 FINDINGS: The heart size and mediastinal contours are stable. The heart size is enlarged. Patchy consolidation of the right lung base is identified. Increased pulmonary interstitium is identified bilaterally at least in part due to known pulmonary fibrosis. Hiatal hernia is identified. The visualized skeletal structures are unremarkable. IMPRESSION: Patchy consolidation of the right lung base, superimposed pneumonia is not excluded. Electronically Signed   By: Abelardo Diesel M.D.   On: 11/05/2022 08:40         Latest Ref Rng & Units 05/24/2021   10:58 AM  PFT Results  FVC-Pre L 1.57   FVC-Predicted Pre % 67   FVC-Post L 1.57   FVC-Predicted Post % 66   Pre FEV1/FVC % % 93   Post FEV1/FCV % % 94   FEV1-Pre L 1.47   FEV1-Predicted Pre % 83   FEV1-Post L  1.46   DLCO uncorrected ml/min/mmHg 7.67   DLCO UNC% % 47   DLCO corrected ml/min/mmHg 7.67   DLCO COR %Predicted % 47   DLVA Predicted % 65   TLC L 2.73   TLC % Predicted % 63   RV % Predicted % 50      No results found for: "NITRICOXIDE"      Assessment & Plan:   No problem-specific Assessment & Plan notes found for this encounter.    I spent   43 minutes dedicated to the care of this patient on the date of this encounter to include pre-visit review of records, face-to-face time with the patient discussing conditions above, post visit ordering of testing, clinical documentation with the electronic health record, making appropriate referrals as documented, and communicating necessary findings to members of the patients care team.   Rexene Edison, NP 11/12/2022

## 2022-11-13 DIAGNOSIS — J9611 Chronic respiratory failure with hypoxia: Secondary | ICD-10-CM | POA: Insufficient documentation

## 2022-11-13 DIAGNOSIS — M359 Systemic involvement of connective tissue, unspecified: Secondary | ICD-10-CM | POA: Insufficient documentation

## 2022-11-13 DIAGNOSIS — R9389 Abnormal findings on diagnostic imaging of other specified body structures: Secondary | ICD-10-CM | POA: Insufficient documentation

## 2022-11-13 NOTE — Assessment & Plan Note (Signed)
ILD changes appear stable on CT scan.-Areas of nodularity will need serial follow-up.  High-resolution CT chest in 3 months.  Also will check spirometry with DLCO.  Continue follow-up with rheumatology and current maintenance regimen for her connective tissue disease. Check 2D echo to rule out underlying pulmonary hypertension/CHF as her BNP was elevated.  Plan  Patient Instructions  Continue on  Oxygen 3l/m , goal is for O2 level to be greater than >88-90%.  POC evaluation this week as planned at [place].  Finish up Augmentin Refer to GI.  2 D echo in 3 months  Set up for HRCT chest in 3 months.  Follow up in 3 months with  Dr. Chase Caller with Spirometry with DLCO  and As needed   Please contact office for sooner follow up if symptoms do not improve or worsen or seek emergency care

## 2022-11-13 NOTE — Assessment & Plan Note (Signed)
Continue follow-up rheumatology.

## 2022-11-13 NOTE — Assessment & Plan Note (Signed)
Incidental finding on CT scan showed nodular density along the distal esophagus.  Will need further evaluation with GI.  Referral to GI has been made.  Also notable large hiatal hernia.

## 2022-11-13 NOTE — Assessment & Plan Note (Signed)
Continue on oxygen to maintain O2 saturations greater than 88 to 90%.  POC evaluation this week as planned

## 2022-11-16 ENCOUNTER — Telehealth: Payer: Self-pay | Admitting: Adult Health

## 2022-11-16 NOTE — Telephone Encounter (Signed)
Spoke with patient advised orders are pending with insurance and someone will call to get her scheduled for the CT and Echo once those have been approved with her insurance. Advised if she had any billing questions she can call [facility]. I provided her with their number. Pt was also asking if there was a DME company close to [facility] [place]. I stated no but also told her it depends on who is in network with her insurance. I advised she can call them and find out. She verbalized understanding. Nothing further needed.

## 2022-11-16 NOTE — Telephone Encounter (Signed)
Pt calling about her last visit. Ms. Madison Holden changed up all testing and now she is getting bills from the insurance company. She would like to talk to a nurse with several questions because things are changing so fast.  She also asked about CT and Echo that NP ordered. I advised her a [facility] would call her.  [phone]

## 2022-11-19 ENCOUNTER — Telehealth: Payer: Self-pay | Admitting: Adult Health

## 2022-11-19 DIAGNOSIS — J849 Interstitial pulmonary disease, unspecified: Secondary | ICD-10-CM

## 2022-11-19 NOTE — Telephone Encounter (Signed)
Order was done on 11/02/22, if new order is needed please place

## 2022-11-19 NOTE — Telephone Encounter (Signed)
PT wanted to speak to a nurse. Needs extra O2 tanks because she has to go back and forth from [place] to [place] to get O2. She does not have a portable tank yet. Pls call @ the number on file. Uses [employer].

## 2022-11-19 NOTE — Telephone Encounter (Signed)
Called [employer] states new order is needed for additional tanks  as well as getting portable   Please advise on new orders

## 2022-11-20 ENCOUNTER — Emergency Department (HOSPITAL_BASED_OUTPATIENT_CLINIC_OR_DEPARTMENT_OTHER): Payer: Medicare Other

## 2022-11-20 ENCOUNTER — Emergency Department (HOSPITAL_COMMUNITY)
Admission: EM | Admit: 2022-11-20 | Discharge: 2022-11-20 | Disposition: A | Discharge status: Home / Self Care | Payer: Medicare Other | Attending: Emergency Medicine | Admitting: Emergency Medicine

## 2022-11-20 ENCOUNTER — Encounter (HOSPITAL_COMMUNITY): Payer: Self-pay

## 2022-11-20 ENCOUNTER — Other Ambulatory Visit: Payer: Self-pay

## 2022-11-20 ENCOUNTER — Emergency Department (HOSPITAL_COMMUNITY): Payer: Medicare Other

## 2022-11-20 ENCOUNTER — Other Ambulatory Visit: Payer: Self-pay | Admitting: *Deleted

## 2022-11-20 DIAGNOSIS — J189 Pneumonia, unspecified organism: Secondary | ICD-10-CM

## 2022-11-20 DIAGNOSIS — R609 Edema, unspecified: Secondary | ICD-10-CM

## 2022-11-20 DIAGNOSIS — M7989 Other specified soft tissue disorders: Secondary | ICD-10-CM | POA: Insufficient documentation

## 2022-11-20 DIAGNOSIS — R600 Localized edema: Secondary | ICD-10-CM | POA: Diagnosis not present

## 2022-11-20 DIAGNOSIS — R2240 Localized swelling, mass and lump, unspecified lower limb: Secondary | ICD-10-CM | POA: Diagnosis not present

## 2022-11-20 NOTE — Progress Notes (Signed)
Lower extremity venous duplex has been completed.   Preliminary results in CV Proc.   Madison Holden 11/20/2022 6:42 PM

## 2022-11-20 NOTE — Progress Notes (Signed)
Patient was seen in the office on 1/22 by Rexene Edison NP for follow up.  She also has a scheduled f/u scheduled for 12/06/22 with CXR prior to visit.  Nothing further needed.

## 2022-11-20 NOTE — Telephone Encounter (Signed)
Urgent oxygen order placed 11/02/22 spoke with patient who states she needs portable concentrator as well as additional tanks since she is running out of oxygen while at other appts. States she went to [employer] and did test and qualified for pulse states that's not what she wants she wants continuous portable only

## 2022-11-20 NOTE — ED Triage Notes (Addendum)
Right foot has been swollen since Friday. Her PCP was concerned for a blood clot. No history of blood clots. No chest pain or shortness of breathe. 3L  at baseline.

## 2022-11-20 NOTE — ED Provider Triage Note (Addendum)
Emergency Medicine Provider Triage Evaluation Note  Madison Holden , a 77 y.o. female  was evaluated in triage.  Pt complains of right foot swelling since Friday.  Her PCP concerned for blood clot and sent her for eval.  No hx of blood clots.  Chronically on O2 for interstitial lung disease.  She states her foot swells with walking, standing, or having in a dependent position, but improves when elevating it.  She reports no redness, increased warmth, or pain to her foot.  Hx significant for rheumatoid arthritis and gout.  She takes allopurinol daily.    Review of Systems  Positive: As above Negative: As above  Physical Exam  BP 128/80 (BP Location: Left Arm)   Pulse 100   Temp 97.8 F (36.6 C) (Oral)   Resp 19   SpO2 92%  Gen:   Awake, no distress   Resp:  Normal effort  MSK:   Moves extremities without difficulty  Other:  Right foot is normal color, skin is dry, no obvious swelling, erythema, or tenderness to the foot or R calf; there is pitting pretibial edema; full passive ROM of ankle and foot without pain; DP pulse difficult to palpate, was found with Doppler  Medical Decision Making  Medically screening exam initiated at 6:14 PM.  Appropriate orders placed.  [name] was informed that the remainder of the evaluation will be completed by another provider, this initial triage assessment does not replace that evaluation, and the importance of remaining in the ED until their evaluation is complete.     Theressa Stamps R, [place] 11/20/22 1817    [name], [place] 11/20/22 [phone]

## 2022-11-20 NOTE — ED Provider Notes (Signed)
[place] [facility] Provider Note   CSN: [id number] Arrival date & time: 11/20/22  1734     History  Chief Complaint  Patient presents with   Foot Swelling    Madison Holden is a 77 y.o. female.  HPI Patient presents with some swelling in her right foot.  Reported began Friday with today being Tuesday.  Saw PCP and sent in for evaluation with worry for DVT.  No trauma.  No pain in the foot.  Reportedly swelling is also going down.  Does have history of rheumatoid arthritis.  Not on blood thinners.  Does have chronic interstitial lung disease but breathing is not different than prior.  Also history of rheumatoid arthritis and gout.    Home Medications Prior to Admission medications   Medication Sig Start Date End Date Taking? Authorizing Provider  acetaminophen (TYLENOL) 650 MG CR tablet Take 650 mg by mouth every 8 (eight) hours as needed for pain.    [provider]  allopurinol (ZYLOPRIM) 300 MG tablet Take 300 mg by mouth daily.    [provider]  amLODipine (NORVASC) 2.5 MG tablet TAKE 1 TABLET BY MOUTH ONCE DAILY 04/18/22   Fay Records, MD  amoxicillin-clavulanate (AUGMENTIN) 875-125 MG tablet Take 1 tablet by mouth 2 (two) times daily. 11/05/22   Parrett, Fonnie Mu, NP  ASA-APAP-Caff Buffered (VANQUISH PO) Take 2 tablets by mouth 2 (two) times a week.    [provider]  B-D TB SYRINGE 1CC/27GX1/2" 27G X 1/2" 1 ML MISC USE WEEKLY WITH METHOTREXATE SUBCUTANEOUSLY 90 DAYS 01/17/19   [provider]  Baclofen 5 MG TABS Take 0.5-1 tablets by mouth at bedtime as needed. 09/25/21   [provider]  diphenhydrAMINE (BENADRYL) 25 MG tablet Take 25 mg by mouth at bedtime as needed for sleep.     [provider]  esomeprazole (NEXIUM) 40 MG capsule Take 40 mg by mouth 3 (three) times a week.    [provider]  etanercept (ENBREL) 50 MG/ML injection Inject 25 mg into the skin 2 (two) times  a week.    [provider]  Ferrous Gluconate 324 (37.5 Fe) MG TABS Take 1 tablet by mouth 2 (two) times daily. 10/28/19   [provider]  folic acid (FOLVITE) 469 MCG tablet Take 800 mcg by mouth daily.    [provider]  hydrochlorothiazide (HYDRODIURIL) 25 MG tablet Take 12.5 mg by mouth daily.    [provider]  ibuprofen (ADVIL) 200 MG tablet     [provider]  ketoconazole (NIZORAL) 2 % cream SMARTSIG:sparingly Topical Twice Daily 11/29/21   [provider]  Magnesium 500 MG TABS Take 500 mg by mouth daily.    [provider]  Melatonin 3 MG TABS Take 3-6 mg by mouth at bedtime as needed (sleep).     [provider]  methotrexate 250 MG/10ML injection once a week. 0.5 ml 11/06/18   [provider]  oxybutynin (DITROPAN) 5 MG tablet Take 5 mg by mouth 2 (two) times daily.    [provider]  pioglitazone (ACTOS) 30 MG tablet Take 30 mg by mouth daily.  02/17/19   [provider]  rosuvastatin (CRESTOR) 5 MG tablet Take 1 tablet (5 mg total) by mouth daily. Start with one tablet every Monday, Wednesday, and Friday. Increase to one tablet daily if tolerating well. 05/28/22   Fay Records, MD  Turmeric 500 MG CAPS Take 500  mg by mouth daily.    [provider]  vitamin B-12 (CYANOCOBALAMIN) 1000 MCG tablet Take 1,000 mcg by mouth daily.    [provider]  vitamin C (ASCORBIC ACID) 500 MG tablet Take 500 mg by mouth daily.    [provider]      Allergies    Lovastatin and Rosuvastatin    Review of Systems   Review of Systems  Physical Exam Updated Vital Signs BP 128/80 (BP Location: Left Arm)   Pulse 100   Temp 97.8 F (36.6 C) (Oral)   Resp 19   SpO2 92%  Physical Exam Vitals and nursing note reviewed.  HENT:     Head: Normocephalic.  Musculoskeletal:        General: No tenderness.     Comments: No pain with movement of toes foot or at ankle.  Small  areas of edema laterally on the foot and little bit anteriorly.  No erythema.  No wounds.  Mild edema on lower leg.     ED Results / Procedures / Treatments   Labs (all labs ordered are listed, but only abnormal results are displayed) Labs Reviewed - No data to display  EKG None  Radiology VAS [place] LOWER EXTREMITY VENOUS (DVT)  Result Date: 11/20/2022  Lower Venous DVT Study Patient Name:  KENDALL ARNELL  Date of Exam:   11/20/2022 Medical Rec #: [id number]        Accession #:    [id number] Date of Birth: 1946/02/04       Patient Gender: F Patient Age:   71 years Exam Location:  [facility] Procedure:      VAS [place] LOWER EXTREMITY VENOUS (DVT) Referring Phys: Davonna Belling --------------------------------------------------------------------------------  Indications: Edema.  Comparison Study: no prior Performing Technologist: Archie Patten RVS  Examination Guidelines: A complete evaluation includes B-mode imaging, spectral Doppler, color Doppler, and power Doppler as needed of all accessible portions of each vessel. Bilateral testing is considered an integral part of a complete examination. Limited examinations for reoccurring indications may be performed as noted. The reflux portion of the exam is performed with the patient in reverse Trendelenburg.  +---------+---------------+---------+-----------+----------+-------------------+ RIGHT    CompressibilityPhasicitySpontaneityPropertiesThrombus Aging      +---------+---------------+---------+-----------+----------+-------------------+ CFV      Full           Yes      Yes                                      +---------+---------------+---------+-----------+----------+-------------------+ SFJ      Full                                                             +---------+---------------+---------+-----------+----------+-------------------+ FV Prox  Full                                                              +---------+---------------+---------+-----------+----------+-------------------+ FV Mid   Full                                                             +---------+---------------+---------+-----------+----------+-------------------+  FV DistalFull                                                             +---------+---------------+---------+-----------+----------+-------------------+ PFV      Full                                                             +---------+---------------+---------+-----------+----------+-------------------+ POP      Full           Yes      Yes                                      +---------+---------------+---------+-----------+----------+-------------------+ PTV      Full                                                             +---------+---------------+---------+-----------+----------+-------------------+ PERO                                                  Not well visualized +---------+---------------+---------+-----------+----------+-------------------+   +----+---------------+---------+-----------+----------+--------------+ LEFTCompressibilityPhasicitySpontaneityPropertiesThrombus Aging +----+---------------+---------+-----------+----------+--------------+ CFV Full           Yes      Yes                                 +----+---------------+---------+-----------+----------+--------------+     Summary: RIGHT: - There is no evidence of deep vein thrombosis in the lower extremity.  - No cystic structure found in the popliteal fossa.  LEFT: - No evidence of common femoral vein obstruction.  *See table(s) above for measurements and observations.    Preliminary    DG Foot Complete Right  Result Date: 11/20/2022 CLINICAL DATA:  Right foot swelling. EXAM: RIGHT FOOT COMPLETE - 3+ VIEW COMPARISON:  None Available. FINDINGS: Hallux valgus with degenerative change of the first metatarsal phalangeal joint. There are  well-defined erosions involving the medial aspect of the first metatarsal head, medial and lateral aspect of the first proximal phalanx. Additional erosions are seen involving the proximal third and fourth metatarsals. Pes planus. Lucency in the calcaneus in the region of the Achilles tendon insertion may represent subcortical cyst or erosion. There is a moderate plantar calcaneal spur. Dorsal talar spurring. Mild midfoot degenerative spurring. Soft tissue edema is seen in the midfoot. No soft tissue gas or radiopaque body. IMPRESSION: 1. Well-defined erosions involving the first metatarsal head, first proximal phalanx, and proximal third and fourth metatarsals, suggestive of gout. 2. Hallux valgus with degenerative change of the first metatarsophalangeal joint. 3. Pes planus. 4. Moderate plantar calcaneal spur. Lucency in the calcaneus in the region of the Achilles tendon insertion may  represent subcortical cyst or erosion. Electronically Signed   By: Keith Rake M.D.   On: 11/20/2022 18:43    Procedures Procedures    Medications Ordered in ED Medications - No data to display  ED Course/ Medical Decision Making/ A&P                             Medical Decision Making  Patient with swelling on foot.  Differential diagnosis includes CHF DVT nonspecific edema infection arthritis.  However joints are reassuring.  No tenderness.  Good range of motion.  Doubt active gout.  Unilateral so doubt cause such as CHF.  Doppler done and negative.  Symptoms have been improving.  Appears stable for discharge home.        Final Clinical Impression(s) / ED Diagnoses Final diagnoses:  Foot swelling    Rx / DC Orders ED Discharge Orders     None         Davonna Belling, MD 11/20/22 1857

## 2022-11-20 NOTE — Discharge Instructions (Signed)
There is some swelling in her foot but there is no blood clot.  Also x-ray just shows some chronic changes.  Follow-up with your doctor as needed.

## 2022-11-21 ENCOUNTER — Telehealth: Payer: Self-pay | Admitting: Adult Health

## 2022-11-21 DIAGNOSIS — J849 Interstitial pulmonary disease, unspecified: Secondary | ICD-10-CM

## 2022-11-21 NOTE — Telephone Encounter (Signed)
Nothing further needed 

## 2022-11-21 NOTE — Telephone Encounter (Signed)
Ret Margie[facility] call. Pls try again. Msg OK. [phone]  Husband says it really helps to have 5 cylinders of O2 and he picked up 5 today. [employer] said they would really like a letter from [place] stating that it is OK to get 5 at a time. Marland Kitchen

## 2022-11-21 NOTE — Telephone Encounter (Signed)
[place] Gray, [place]; [place], Leory Plowman; [place], [place]; Nash Shearer This Pt has O2 currently but, this pt did not pass her POC walk test and does not qualify- she already has home fill- I will put in order for more tanks for her to fill and have on hand/discuss options. Order still mentions POC but continuous- which we do not carry. Please let me know if anything else is needed.

## 2022-11-21 NOTE — Telephone Encounter (Signed)
PT calling saying Ms. P noticed swelling in her foot when she was in for an appt. Tammy asked her about it.  Since that visit it started to swell up quite a bit. One of the ladies at her [facility] said it may poss be a blood clot. She went to [facility] . They gave her an Xray and all was well and they also did a sonogram of the foot and an ultra sound to look at her vascular system. No signs of a blood clot.  The ultra sound lady said she could tell there was some fluid at the bottom of her leg that gravitates to the foot. She wanted Tammy Parrett to know. No meds were given and no orders for her to call in to any Dr's.   Ultimately no clot and no gout. And she wants to know the connection between swelling and pulmonary system. Pls call PT to advise. [phone] OK to leave message if she is at a Dr. Hilaria Ota.

## 2022-11-21 NOTE — Telephone Encounter (Signed)
Lm x1 for patient.  

## 2022-11-22 NOTE — Telephone Encounter (Signed)
Order placed that states it is approved by Tammy that patient is able to pick up 5 oxygen takes at a time. Nothing further needed

## 2022-11-22 NOTE — Telephone Encounter (Signed)
That is fine 

## 2022-11-22 NOTE — Telephone Encounter (Signed)
Husband states that he is needing a letter sent to [employer] stating it is ok for them to pick up 5 oxygen tanks at a time for his wife.   Tammy are you ok if I write this letter/order to [employer] stating it is ok to pick up 5 tanks at a time  Please advise

## 2022-11-23 DIAGNOSIS — Z0000 Encounter for general adult medical examination without abnormal findings: Secondary | ICD-10-CM | POA: Diagnosis not present

## 2022-11-30 ENCOUNTER — Telehealth: Payer: Self-pay | Admitting: Adult Health

## 2022-11-30 DIAGNOSIS — R0981 Nasal congestion: Secondary | ICD-10-CM

## 2022-11-30 NOTE — Telephone Encounter (Signed)
Spoke with patient, she states she still has a stuffy nose, no coughing, wheezing, or sob. Patient states oxygen is making nose stuffy and dry. Patient is wondering is she can get an antibiotic and a humidifier. Tammy if your fine with the humidifier we can place those order? Just FYI patient states once she gets to feel better she would like to try and qualify for a poc.   Pharmacy: [facility] in [place]

## 2022-11-30 NOTE — Telephone Encounter (Signed)
Spoke with patient. Discussed Tammy's recommendations. Order has been placed for humidification water bottle. Nothing further needed.

## 2022-11-30 NOTE — Telephone Encounter (Signed)
Will try saline nasal gel twice daily for nasal stuffiness and dryness. Can also use a humidifier in your bedroom just keep it very clean If you do not have humidification water bottle on your concentrator we can add that through your DME

## 2022-12-06 ENCOUNTER — Ambulatory Visit: Payer: Medicare Other | Admitting: Adult Health

## 2022-12-10 ENCOUNTER — Telehealth: Payer: Self-pay | Admitting: Internal Medicine

## 2022-12-10 NOTE — Telephone Encounter (Signed)
Would recommend using flonase nasal spray once daily and saline nasal rinses twice daily. Also would try over the counter antihistamine such as claritin or zyrtec.

## 2022-12-10 NOTE — Telephone Encounter (Signed)
Spoke with patient went over [employer] recommendations. She verbalized understanding. Nothing further needed

## 2022-12-10 NOTE — Telephone Encounter (Signed)
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 With her o2 machine she now has a humidifier attached she states this doesn't seem to be helping with the stuffy nose and wonders what else she can do?   Since Tammy is off [place]io Maize can you please advise  Pharmacy [facility] [place]

## 2022-12-14 ENCOUNTER — Ambulatory Visit (INDEPENDENT_AMBULATORY_CARE_PROVIDER_SITE_OTHER): Payer: Medicare Other | Admitting: Internal Medicine

## 2022-12-14 DIAGNOSIS — M359 Systemic involvement of connective tissue, unspecified: Secondary | ICD-10-CM

## 2022-12-14 DIAGNOSIS — J8489 Other specified interstitial pulmonary diseases: Secondary | ICD-10-CM | POA: Diagnosis not present

## 2022-12-14 LAB — PULMONARY FUNCTION TEST
DL/VA % pred: 50 %
DL/VA: 2.16 ml/min/mmHg/L
DLCO cor % pred: 31 %
DLCO cor: 5.07 ml/min/mmHg
DLCO unc % pred: 30 %
DLCO unc: 4.84 ml/min/mmHg
FEF 25-75 Pre: 3.22 L/sec
FEF2575-%Pred-Pre: 225 %
FEV1-%Pred-Pre: 77 %
FEV1-Pre: 1.31 L
FEV1FVC-%Pred-Pre: 126 %
FEV6-%Pred-Pre: 64 %
FEV6-Pre: 1.38 L
FEV6FVC-%Pred-Pre: 105 %
FVC-%Pred-Pre: 60 %
FVC-Pre: 1.38 L
Pre FEV1/FVC ratio: 95 %
Pre FEV6/FVC Ratio: 100 %

## 2022-12-14 NOTE — Progress Notes (Signed)
Spirometry & DLCO Performed Today.

## 2022-12-14 NOTE — Patient Instructions (Signed)
Spirometry & DLCO Performed Today.

## 2022-12-17 ENCOUNTER — Encounter: Payer: Self-pay | Admitting: Physician Assistant

## 2022-12-19 ENCOUNTER — Telehealth: Payer: Self-pay | Admitting: Adult Health

## 2022-12-19 NOTE — Telephone Encounter (Signed)
Patient would like to know if Gastro appointment 01/25/2023 is ok. Also would like to know if she needs labwork. Patient phone number is [phone].

## 2022-12-19 NOTE — Telephone Encounter (Signed)
Patient calling to advise of upcoming gastro appointment, she was asking if she would need blood work. I advised patient to discuss this with them at the appointment. She verbalized understanding. Nothing further needed.

## 2022-12-21 ENCOUNTER — Telehealth: Payer: Self-pay | Admitting: Internal Medicine

## 2022-12-21 NOTE — Telephone Encounter (Signed)
Spoke with patient. Advised per Tammy's last note she wanted her to have a Chest CT in 3 months. Read off instructions from last note for patient. CT has been ordered and scheduled. She verbalized understanding. Nothing further needed. ILD changes appear stable on CT scan.-Areas of nodularity will need serial follow-up.  High-resolution CT chest in 3 months.

## 2022-12-21 NOTE — Telephone Encounter (Signed)
PT has a CT sched for  4/11. Wonders if this is ness for her next appt here. She states she had one done in January (22) right after her last appt here. Pls call PT to advise. Her # is [phone]  Also, is CT high Contrast? She had issues last time.  Will her last mammogram help her Pulmonary Dr. Parks Ranger was done 12/2021  OK to leave VM she said. OK per DPR on file.

## 2022-12-25 ENCOUNTER — Ambulatory Visit: Payer: Medicare Other | Admitting: Internal Medicine

## 2022-12-25 ENCOUNTER — Encounter: Payer: Self-pay | Admitting: Internal Medicine

## 2022-12-25 VITALS — BP 120/70 | HR 104 | Ht <= 58 in | Wt 150.8 lb

## 2022-12-25 DIAGNOSIS — M359 Systemic involvement of connective tissue, unspecified: Secondary | ICD-10-CM

## 2022-12-25 DIAGNOSIS — J8489 Other specified interstitial pulmonary diseases: Secondary | ICD-10-CM

## 2022-12-25 DIAGNOSIS — D84821 Immunodeficiency due to drugs: Secondary | ICD-10-CM

## 2022-12-25 DIAGNOSIS — J9611 Chronic respiratory failure with hypoxia: Secondary | ICD-10-CM | POA: Diagnosis not present

## 2022-12-25 DIAGNOSIS — Z5181 Encounter for therapeutic drug level monitoring: Secondary | ICD-10-CM | POA: Diagnosis not present

## 2022-12-25 DIAGNOSIS — Z79899 Other long term (current) drug therapy: Secondary | ICD-10-CM

## 2022-12-25 DIAGNOSIS — K449 Diaphragmatic hernia without obstruction or gangrene: Secondary | ICD-10-CM

## 2022-12-25 NOTE — Progress Notes (Signed)
IOV 05/18/21 - Dr Valeta Harms  Synopsis: Referred in July 2022 for cough and abnormal CT PCP: by Aura Dials, MD  Subjective:   PATIENT ID: Madison Holden GENDER: female DOB: 07-Jun-1946, MRN: [id number]  Chief Complaint  Patient presents with   Consult    Consult for chronic cough.     77 yo, HTN, arthritis, gout. C/o cough. Has been going on for several months. She went on a riverboat cruise this past year. Her cough is dry and non-productive. Cough is usually late at night or early in the morning.  From a respiratory standpoint is short of breath with exertion.  She has a dry nonproductive cough.  She has rheumatoid arthritis currently on Enbrel plus methotrexate.  She recently had a CT scan of the chest which revealed subpleural lower lobe reticulation and evidence of fibrosis.  She was sent here for evaluation of chronic cough and abnormal CT.    OV 05/31/2021 -transferred to Dr. Chase Caller in the ILD center  Subjective:  Patient ID: Madison Holden, female , DOB: Sep 15, 1946 , age 72 y.o. , MRN: [id number] , ADDRESS: [address] [place] [id number] PCP Aura Dials, MD Patient Care Team: Aura Dials, MD as PCP - General (Family Medicine) Fay Records, MD as PCP - Cardiology (Cardiology) Orpah Melter, MD (Family Medicine)  This Provider for this visit: Treatment Team:  Attending Provider: Brand Males, MD    05/31/2021 -   Chief Complaint  Patient presents with   Follow-up    Pt saw Dr. Valeta Harms and is now being switched to Dr. Chase Caller. States she has had an occ cough that is worse in the mornings when she first wakes up.     HPI Marlana Ratledge [facility] 77 y.o. -history is provided by the patient, her husband and also review of the medical records.  She sees Leafy Kindle at [facility] rheumatology.  She tells me that she has had a diagnosis of gout for few years.  Then approximately in early 2020 she had a diagnosis of rheumatoid arthritis  given to her.  She does have rheumatoid arthritis deformities in her hands.  Shortly after that she was started on methotrexate which she says also for her gout.  She was also then started on Enbrel.  She believes that in 2019 September [personally visualized and confirmed) she had a CT scan of the abdomen that on the lung images she had presence of ILD.  This was followed with a CT chest with contrast that is described with reticulation but no honeycombing.  Pulmonary consultation was recommended but because of the pandemic she never could get to it.  She says at baseline she just has very minimal shortness of breath but then she is obese and sedentary and therefore she not able to experience her shortness of breath.  Then approximately February 2022 started having chronic dry cough that is persistent since then.  She has been referred here for the cough.  Yesterday on 6-minute walk test she did desaturate to 87% but she says she does not normally exert that much.  She does not believe portable oxygen will help her.  She believes her current regimen of methotrexate and Enbrel is controlling her pain quite well.   In 2019 she did have a normal myocardial perfusion stress test. Pulmonary function test yesterday shows restriction with low diffusion. 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  Mostly sedentary. Uses walker    CT chest 04/25/21 FINDINGS: Cardiovascular: Atherosclerotic calcification of the aorta, aortic valve and coronary arteries. Pulmonic trunk and heart are enlarged. No pericardial effusion.   Mediastinum/Nodes: Subcentimeter low-attenuation lesion in the right thyroid. No follow-up recommended. (Ref: J Am Coll Radiol. 2015 Feb;12(2): 143-50).Mediastinal lymph nodes measure up to 1.3 cm in the low  right paratracheal station, unchanged. Bihilar lymph nodes measure up to 1.3 cm on the right, also unchanged. No axillary adenopathy. Esophagus is grossly unremarkable. Moderate hiatal hernia.   Lungs/Pleura: Peripheral and somewhat basilar predominant subpleural reticulation, ground-glass and traction bronchiectasis/bronchiolectasis, as on 11/20/2018. No pleural fluid. Airway is unremarkable. IMPRESSION: 1. Pulmonary parenchymal pattern of fibrosis is unchanged from 11/20/2018 and most likely due to usual interstitial pneumonitis. Future evaluation utilizing high-resolution chest CT without contrast is suggested. 2. Chronically enlarged mediastinal and hilar lymph nodes, likely related interstitial lung disease. 3. Moderate hiatal hernia. 4. Aortic atherosclerosis (ICD10-I70.0). Coronary artery calcification. 5. Enlarged pulmonic trunk, indicative of pulmonary arterial hypertension.     Electronically Signed   By: Lorin Picket M.D.   On: 04/26/2021 07:56      OV 07/19/2021  Subjective:  Patient ID: Madison Holden, female , DOB: [birth year] , age 28 y.o. , MRN: [id number] , ADDRESS: [address] [place] [phone] PCP Aura Dials, MD Patient Care Team: Aura Dials, MD as PCP - General (Family Medicine) Fay Records, MD as PCP - Cardiology (Cardiology) Orpah Melter, MD (Family Medicine)  This Provider for this visit: Treatment Team:  Attending Provider: Brand Males, MD    07/19/2021 -   Chief Complaint  Patient presents with   Follow-up    Pt is following up today after recent bronch.  Pt states she has been doing okay and denies any complaints.    HPI Kadidia Sthilaire [facility] 77 y.o. -returns for follow-up to discuss bronchoalveolar lavage results and also ILD questionnaire..  ILD questionnaire information is elicited below.    Vel[facility] Heck[facility] Integrated Comprehensive ILD Questionnaire  Symptoms:  -Insidious onset of symptoms since it started  around the same shortness of breath for 3 years.  No episodes present with exertion relieved by rest.  Although the cough started in February 2022.  Since it started its the same.  Mostly clear sometimes has yellow in it.  Never had hemoptysis.  It does affect her voice.  She does clear her throat.  Cough is sometimes raspy.  Symptom severity score is listed below.     Past Medical History :  -Significant for rheumatoid arthritis for several years.  Diagnosed was in 2020.  Is on methotrexate and Enbrel.  She does not want to change this ideally because pain control is pretty good.  No other connective tissue disease. -She does have nocturnal desaturations diagnosed summer 2022.  She does not want to wear oxygen.  She does not want to have sleep test -She has exertional hypoxemia diagnosed, 2022.  Does not want to use oxygen. -Has type 2 diabetes under control with diet -Has hypertension -on hydrochlorothiazide and also calcium channel blocker.  Previously on ACE inhibitor -Has gout on allopurinol -Moderate hiatal hernia on CT scan -Denies PE or heart disease or stroke -Has had COVID-vaccine but has never had COVID   ROS:   -Positive for fatigue arthritis occasional dysphagia.  She has had 80  pound weight loss in 2019 and 10 months but none since then.  No acid reflux although she does have hiatal hernia  FAMILY HISTORY of LUNG DISEASE:  Denies  PERSONAL EXPOSURE HISTORY:   -Never smoked cigarettes or cigars.  Never smoked marijuana.  Never smoked cocaine.  No intravenous drug use.  HOME  EXPOSURE and HOBBY DETAILS :   [place] age of the current home is 35 years.  The bathroom did have a little mold in the ceiling but they treated it.  She had some mold in the floor but got replaced.  She does use a feather pillow for many years.  She had a sitter cleaned 20 years ago but there is no mold in it.  OCCUPATIONAL HISTORY (122 questions) :  -Other  than using for the pillow she has no organic antigen exposure history -Inorganic antigen exposure history at work is negative  PULMONARY TOXICITY HISTORY (27 items):  -Positive for methotrexate in 2020.  She took prednisone briefly between 2019 and 2020.  She is on Enbrel since 2020.  This controls the pain well. 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  Differential diagnosis for this includes medication such as methotrexate, allopurinol and hydrochlorothiazide which she is on.  She is not on any ACE inhibitor or cancer drugs or sulfa drugs or NSAIDs which are also known to cause eosinophilia.  The mixed cellularity in the absence of infection and the mild eosinophilia suggests interstitial lung disease from connective tissue disease as the cause   Results for AVIV, SALZ (MRN [id number]) as of 07/19/2021 13:23  Ref. Range 06/27/2021 13:31  Monocyte-Macrophage-Serous Fluid Latest Ref Range: 50 - 90 % 29 (L)  Other Cells, Fluid Latest Units: % CORRELATE WITH CYTOLOGY.  Color, Fluid Latest Ref Range: YELLOW  COLORLESS (A)  Total Nucleated Cell Count, Fluid Latest Ref Range: 0 - 1,000 cu mm 147  Fluid Type-FCT Unknown Bronch Lavag  Lymphs, Fluid Latest Units: % 0  Eos, Fluid Latest Units: % 11  Appearance, Fluid Latest Ref Range: CLEAR  HAZY (A)  Neutrophil Count, Fluid Latest Ref Range: 0 - 25 % 60 (H)   Overnight pulse ox study done on room air on 06/06/2021 shows pulse ox less than or equal to 88% for 1 hour and 7 minutes and 48  seconds. Start 2 L nasal cannula oxygen   IMPRESSIONS  ECHO sept 2022    1. Left ventricular ejection fraction, by estimation, is 60 to 65%. The  left ventricle has normal function. The left ventricle has no regional  wall motion abnormalities. There is moderate concentric left ventricular  hypertrophy. Left ventricular  diastolic parameters are indeterminate.   2. Right ventricular systolic function is normal. The right ventricular  size is normal.   3. The mitral valve is grossly normal. Trivial mitral valve  regurgitation.   4. The aortic valve is normal in structure. Aortic valve regurgitation is  not visualized. No aortic stenosis is present   11/02/2022 Follow up : CTD related ILD and Hypoxemia  Patient presents for a follow-up visit.  Last seen July 19, 2021.  Patient missed her recommended follow-up.  Patient says overall breathing has been gradually getting worse over the last year but definitely substantially raised shortness of breath over the last 6 weeks.  She is becoming more short of breath with activities.  Last visit patient was recommended begin oxygen with activity and at bedtime for known hypoxemia nocturnally and with activity. She declined oxygen last visit.  Today in the office on arrival O2 saturations were 74% with ambulation.  Patient required 3 L of oxygen to maintain O2 saturations greater than 88 to 90% she also has been noticing weight loss she is down 30 pounds since last visit.  She denies any hemoptysis, chest pain, orthopnea, edema. Patient has underlying rheumatoid arthritis and is on Enbrel and methotrexate  Says that over last 2 months that that is becoming harder for her to do things without becoming very short of breath.  She says she has to pant for air at time   11/12/2022 Follow up : CTD related ILD, chronic respiratory failure Patient returns for a 1 week follow-up.  Patient was seen last visit with a progressive clinical decline.  She had  increased shortness of breath with decreased activity tolerance.  Notable exertional desaturations.  She was treated for a possible ILD flare plus or minus acute bronchitis.  She was given Augmentin x 7 days..  She was started on oxygen 3 L.  She was recommended for hospitalization but declined.  Lab work did show an elevated D-dimer.  Subsequent CT chest that was completed today showed diffuse subpleural reticulation, groundglass that appears to be stable.  Negative PE, nodularity of the right upper lobe measuring 7 x 6 mm appears stable dating back to June 2022, large hiatal hernia, nodular area of increased density near the gastroesophageal junction.  We discussed her CT scan in detail.  Since last visit patient says she is feeling much better.  Oxygen is really helping her.  Feels that she can have increased activity tolerance now.  She does have some mild nasal congestion and cough.  She has 2 days left of Augmentin. Patient says she does have some mild intermittent dysphagia.   She denies any hemoptysis, fever, or discolored mucus.  She is going to DME company this week to see if she qualifies for portable oxygen concentrator    CT Chest data - CTA 11/12/22  Narrative & Impression  CLINICAL DATA:  Positive D-dimer in a 77 year old female, concern for pulmonary embolism in the setting of endometrial cancer in a patient with shortness of breath.   * Tracking Code: BO *   EXAM: CT ANGIOGRAPHY CHEST WITH CONTRAST   TECHNIQUE: Multidetector CT imaging of the chest was performed using the standard protocol during bolus administration of intravenous contrast. Multiplanar CT image reconstructions and MIPs were obtained to evaluate the vascular anatomy.   RADIATION DOSE REDUCTION: This exam was performed according to the departmental dose-optimization program which includes automated exposure control, adjustment of the mA and/or kV according to patient size and/or use of iterative  reconstruction technique.   CONTRAST:  55m ISOVUE-370 IOPAMIDOL (ISOVUE-370) INJECTION 76%   COMPARISON:  April 25, 2021.   FINDINGS: Cardiovascular: Main pulmonary artery is well opacified measuring 4.4 cm greatest axial dimension and showing a density of 541 Hounsfield units. There is no evidence of pulmonary embolism.   Calcified and noncalcified plaque of a nonaneurysmal thoracic aorta. Aorta with smooth contours. Tortuous great vessels in the chest.   Heart size mildly enlarged.  No pericardial effusion. Mitral annular calcifications. Coronary artery calcifications of LEFT coronary circulation.   Mediastinum/Nodes: Very large hiatal hernia. In the distal esophagus there is an area of added density measuring 13 x 15 mm (image 97/4) this is surrounded by an area of even more dense material. No perigastric stranding. No signs of supraclavicular, axillary or mediastinal lymphadenopathy.   Lungs/Pleura: Diffuse subpleural reticulation, ground-glass and interstitial prominence that favor subpleural lung. Pattern is very similar to prior imaging and is not associated with new areas of consolidative change, pneumothorax or pleural effusion.   There is an area of nodularity along bronchovascular structures in the RIGHT upper lobe (image 38/12 this measures approximately 7 x 6 mm and may be contiguous with thickening of bronchovascular structures extending from the RIGHT hilum. This more focal nodular appearance is different than other areas in the chest and appears stable however dating back to June of 2022 measured in retrospect by this observer on the prior study.   Upper Abdomen: Incidental imaging of upper abdominal contents shows no acute process on limited assessment. Stomach is nearly entirely herniated into the chest. This is predominantly paraesophageal herniation but shows small sliding component.   Musculoskeletal: No chest wall abnormality. No acute or  significant osseous findings. Spinal degenerative changes are similar to previous imaging.   Review of the MIP images confirms the above findings.   IMPRESSION: 1. Negative for pulmonary embolism. 2. Enlargement of the main pulmonary artery compatible with pulmonary arterial hypertension in the setting of interstitial lung disease. 3. Large hiatal hernia. Nodular area of increased density near the gastroesophageal junction surrounded by greater area of density. This could represent ingested material. Would however correlate with any evidence of gastrointestinal bleeding and also with recent medication ingestion that could account for these findings. The nodular appearance is moderately concerning and dedicated esophageal assessment may be warranted to exclude underlying lesion in this location. 4. Stable nodular area along bronchovascular structures in the RIGHT upper lobe may simply reflect confluent interstitial fibrosis but would suggest close attention on follow-up. Consider follow-up at 6 months from the current study to ensure continued stability since July of 2022. Could consider evaluation with interstitial lung disease, high-resolution protocol for further assessment as warranted at that time or as based on clinical symptoms. 5. Aortic atherosclerosis.   Aortic Atherosclerosis (ICD10-I70.0).   These results were called by telephone at the time of interpretation on 11/12/2022 at 1:43 pm to provider [facility] PARRETT , who verbally acknowledged these results.     Electronically Signed   By: Zetta Bills M.D.   On: 11/12/2022 13:43      OV 12/25/2022  Subjective:  Patient ID: [name], female , DOB: 1946/08/01 , age 58 y.o. , MRN: [id number] , ADDRESS: [address] [place] [id number] PCP Aura Dials, MD Patient Care Team: Aura Dials, MD as PCP - General (Family Medicine)  This Provider for this visit: Treatment Team:  Attending Provider:  Brand Males, MD  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  -Associated with connective tissue disease rheumatoid arthritis  -Associated with hiatal hernia moderate  -Associated feather pillow use at home September 2022 [no lymphocytes in lavage September 2022]  -Unchanged January 2020 -> July 2022  -Bronchoscopy with lavage September 2022   -11% eosinophils, 60% neutrophils, no lymphocytes and culture negative   -September 2022: Medications associated with eosinophilia include methotrexate, allopurinol, hydrochlorothiazide  Abnormal 6-minute walk test with hypoxemia and nocturnal desaturation-summer 2022  -Refuses oxygen therapy  and sleep apnea evaluation   Obesity with sedentary lifestyle using cane and walker  -Weight loss in   - Sept 2022 : 189#   - Jan 2024: 161# -  12/25/2022: 2024" 150#  Possible diastolic dysfunction on echocardiogram summer 2022  Hiatal hernia hernia   - described as moderate in 2022 -Described as large in 2024 January  12/25/2022 -   Chief Complaint  Patient presents with   Follow-up    F/up PFT     HPI ALFIE SVETLIK 77 y.o. -returns for follow-up.  I personally not seen her in 2022.  In fact in 2023 she never saw any of [place].  During this time she lost a lot of weight.  Weight chart shows that she is lost 40 pounds between September 2022 and currently.  She is denying any dysphagia.  In the most recent CT scan in January 2024 hiatal hernia is now described as large.  She and her husband could not recollect previous mention of hiatal hernia although the one time I met her in 2022 I did indicate to her per documentation.  She states that she is not having any acid reflux because she takes her medications for this.  Although the husband did indicate that when she takes rice or chicken there is some difficulty.  She has upcoming GI appointment for the hiatal hernia.  In April 2024 with Dr. Vicie Mutters   In terms of respiratory status:  January  2024 she presented with significant amount of hypoxemia.  Emergent evaluation showed that she did not have pulmonary embolism.  She has been started on oxygen which she is using 3 L continuously although today she was 93% room air at rest and with exertion she was requiring 5 L correction even to walk 1 lap in our office.  And during this time her FVC has declined at least 12% through February 2024.  She has upcoming echocardiogram January 02, 2023  In terms of rheumatoid arthritis she continues on immunosuppression Enbrel and methotrexate.   SYMPTOM SCALE - ILD 07/19/2021  Current weight   O2 use ra  Shortness of Breath 0 -> 5 scale with 5 being worst (score 6 If unable to do)  At rest 0  Simple tasks - showers, clothes change, eating, shaving 0  Household (dishes, doing bed, laundry) 1  Shopping 0  Walking level at own pace 1  Walking up Stairs 3  Total (30-36) Dyspnea Score 5  How bad is your cough? 3  How bad is your fatigue 3  How bad is nausea 0  How bad is vomiting?  0  How bad is diarrhea? 0  How bad is anxiety? 1  How bad is depression 0  Any chronic pain - if so where and how bad Yes from RAM in neck    Simple office walk 185 feet x  3 laps goal with forehead probe 12/25/2022 93% on RA atre   O2 used 5L [place]   Number laps completed Half lap   Comments about pace slow   Resting Pulse Ox/HR x% and x/min   Final Pulse Ox/HR x% and x/min   Desaturated </= 88% x   Desaturated <= 3% points x   Got Tachycardic >/= 90/min x   Symptoms at end of test x   Miscellaneous comments Needed 5L to walk 100 feet without deat    MODIFIED walk test       Latest Reference Range & Units 05/02/09 10:01 06/29/10  09:48 07/01/18 11:50 07/18/18 12:06 11/05/19 08:06 01/07/20 08:45 04/18/20 07:35 08/15/22 16:36  AST 15 - 41 U/L 33 46 (H) 34 25 43 (H) 56 (H) 60 (H) 30  ALT 0 - 44 U/L 40 (H)  22 17 51 (H) 54 (H) 45 (H) 16  (H): Data is abnormally high  No results found.   Latest  Reference Range & Units 07/01/18 11:50 07/01/18 14:20 07/02/18 02:07 07/18/18 12:06 07/23/18 03:41 11/07/22 11:25  Hemoglobin 12.0 - 15.0 g/dL 10.5 (L) 9.8 (L) 9.0 (L) 9.4 (L) 7.8 (L) 12.0  (L): Data is abnormally low  PFT     Latest Ref Rng & Units 12/14/2022    1:52 PM 05/24/2021   10:58 AM  PFT Results  FVC-Pre L 1.38  1.57   FVC-Predicted Pre % 60  67   FVC-Post L  1.57   FVC-Predicted Post %  66   Pre FEV1/FVC % % 95  93   Post FEV1/FCV % %  94   FEV1-Pre L 1.31  1.47   FEV1-Predicted Pre % 77  83   FEV1-Post L  1.46   DLCO uncorrected ml/min/mmHg 4.84  7.67   DLCO UNC% % 30  47   DLCO corrected ml/min/mmHg 5.07  7.67   DLCO COR %Predicted % 31  47   DLVA Predicted % 50  65   TLC L  2.73   TLC % Predicted %  63   RV % Predicted %  50        has a past medical history of Ambulates with cane, Arthritis, Chest pain, unspecified (07-01-2018  per pt had 2 day chest pain and sob  6 weeks ago,, per pt no symptoms since), Diarrhea, unspecified, DOE (dyspnea on exertion), Endometrial cancer ([place]), Essential hypertension, Frequency of urination, GERD (gastroesophageal reflux disease), Gout (rheumologist-- michelle young PA ([place] [facility])), Heart murmur, History of adenomatous polyp of colon, History of esophageal dilatation, Mild nausea, Mixed dyslipidemia, Muscle weakness of extremity, Poor appetite, Type 2 diabetes mellitus ([place]), Urgency of urination, and Urine frequency.   reports that she has never smoked. She has never used smokeless tobacco.  Past Surgical History:  Procedure Laterality Date   BREAST CYST EXCISION Right 1974   benign per pt   BRONCHIAL WASHINGS  06/27/2021   Procedure: BRONCHIAL WASHINGS;  Surgeon: Brand Males, MD;  Location: [facility];  Service: Endoscopy;;   CATARACT EXTRACTION W/ INTRAOCULAR LENS  IMPLANT, BILATERAL  2014;  2017   MOUTH SURGERY  11/2017   Upper quadrants,  per pt peridontist did laser procedure on gums   ROBOTIC  ASSISTED TOTAL HYSTERECTOMY WITH BILATERAL SALPINGO OOPHERECTOMY Bilateral 07/22/2018   Procedure: XI ROBOTIC ASSISTED TOTAL HYSTERECTOMY WITH BILATERAL SALPINGO OOPHORECTOMY AND SENTINAL LYMPH NODE BIOPSY;  Surgeon: Everitt Amber, MD;  Location: [facility];  Service: Gynecology;  Laterality: Bilateral;   TONSILLECTOMY  child   VIDEO BRONCHOSCOPY N/A 06/27/2021   Procedure: VIDEO BRONCHOSCOPY WITHOUT FLUORO;  Surgeon: Brand Males, MD;  Location: [facility];  Service: Endoscopy;  Laterality: N/A;    Allergies  Allergen Reactions   Lovastatin    Rosuvastatin Other (See Comments)    Immunization History  Administered Date(s) Administered   Moderna Sars-Covid-2 Vaccination 11/26/2019, 12/22/2019, 08/12/2020   Pneumococcal Conjugate-13 01/29/2017   Pneumococcal Polysaccharide-23 08/01/2012   Tdap 01/17/2011   Zoster, Live 05/15/2012    Family History  Problem Relation Age of Onset   Heart disease Mother    Heart failure Mother    Heart  disease Father    Prostate cancer Father    Clotting disorder Father    Diabetes Maternal Grandfather    Colon cancer Neg Hx      Current Outpatient Medications:    acetaminophen (TYLENOL) 650 MG CR tablet, Take 650 mg by mouth every 8 (eight) hours as needed for pain., Disp: , Rfl:    allopurinol (ZYLOPRIM) 300 MG tablet, Take 300 mg by mouth daily., Disp: , Rfl:    amLODipine (NORVASC) 2.5 MG tablet, TAKE 1 TABLET BY MOUTH ONCE DAILY, Disp: 90 tablet, Rfl: 3   ASA-APAP-Caff Buffered (VANQUISH PO), Take 2 tablets by mouth 2 (two) times a week., Disp: , Rfl:    B-D TB SYRINGE 1CC/27GX1/2" 27G X 1/2" 1 ML MISC, USE WEEKLY WITH METHOTREXATE SUBCUTANEOUSLY 90 DAYS, Disp: , Rfl:    Baclofen 5 MG TABS, Take 0.5-1 tablets by mouth at bedtime as needed., Disp: , Rfl:    diphenhydrAMINE (BENADRYL) 25 MG tablet, Take 25 mg by mouth at bedtime as needed for sleep. , Disp: , Rfl:    esomeprazole (NEXIUM) 40 MG capsule, Take 40 mg by mouth 3 (three) times a  week., Disp: , Rfl:    etanercept (ENBREL) 50 MG/ML injection, Inject 25 mg into the skin 2 (two) times a week., Disp: , Rfl:    Ferrous Gluconate 324 (37.5 Fe) MG TABS, Take 1 tablet by mouth 2 (two) times daily., Disp: , Rfl:    folic acid (FOLVITE) Q000111Q MCG tablet, Take 800 mcg by mouth daily., Disp: , Rfl:    hydrochlorothiazide (HYDRODIURIL) 25 MG tablet, Take 12.5 mg by mouth daily., Disp: , Rfl:    ibuprofen (ADVIL) 200 MG tablet, , Disp: , Rfl:    ketoconazole (NIZORAL) 2 % cream, SMARTSIG:sparingly Topical Twice Daily, Disp: , Rfl:    Magnesium 500 MG TABS, Take 500 mg by mouth daily., Disp: , Rfl:    Melatonin 3 MG TABS, Take 3-6 mg by mouth at bedtime as needed (sleep). , Disp: , Rfl:    methotrexate 250 MG/10ML injection, once a week. 0.5 ml, Disp: , Rfl:    oxybutynin (DITROPAN) 5 MG tablet, Take 5 mg by mouth 2 (two) times daily., Disp: , Rfl:    pioglitazone (ACTOS) 30 MG tablet, Take 30 mg by mouth daily. , Disp: , Rfl:    rosuvastatin (CRESTOR) 5 MG tablet, Take 1 tablet (5 mg total) by mouth daily. Start with one tablet every Monday, Wednesday, and Friday. Increase to one tablet daily if tolerating well., Disp: 90 tablet, Rfl: 3   Turmeric 500 MG CAPS, Take 500 mg by mouth daily., Disp: , Rfl:    vitamin B-12 (CYANOCOBALAMIN) 1000 MCG tablet, Take 1,000 mcg by mouth daily., Disp: , Rfl:    vitamin C (ASCORBIC ACID) 500 MG tablet, Take 500 mg by mouth daily., Disp: , Rfl:    amoxicillin-clavulanate (AUGMENTIN) 875-125 MG tablet, Take 1 tablet by mouth 2 (two) times daily. (Patient not taking: Reported on 12/25/2022), Disp: 14 tablet, Rfl: 0      Objective:   Vitals:   12/25/22 1015  BP: 120/70  Pulse: (!) 104  SpO2: 90%  Weight: 150 lb 12.8 oz (68.4 kg)  Height: '4\' 10"'$  (1.473 m)    Estimated body mass index is 31.52 kg/m as calculated from the following:   Height as of this encounter: '4\' 10"'$  (1.473 m).   Weight as of this encounter: 150 lb 12.8 oz (68.4  kg).  '@WEIGHTCHANGE'$ @  [employer]  12/25/22 1015  Weight: 150 lb 12.8 oz (68.4 kg)     Physical Exam   General: No distress. Lost weight .looks deconditoned. Husband at side Neuro: Alert and Oriented x 3. GCS 15. Speech normal Psych: Pleasant Resp:  Barrel Chest - no.  Wheeze - no, Crackles - yes, No overt respiratory distress CVS: Normal heart sounds. Murmurs - no Ext: Stigmata of Connective Tissue Disease - Ra + HEENT: Normal upper airway. PEERL +. No post nasal drip        Assessment:       ICD-10-CM   1. Interstitial lung disease due to connective tissue disease ([place])  J84.89    M35.9     2. Chronic respiratory failure with hypoxia (HCC)  J96.11     3. Immunosuppression due to drug therapy ([place])  D84.821    Z79.899     4. Encounter for medication monitoring  Z51.81     5. Hiatal hernia  K44.9          Plan:     Patient Instructions  Interstitial lung disease due to connective tissue disease ([place]) Chronic respiratory failure with hypoxia (HCC) Immunosuppression due to drug therapy ([place]) Encounter for medication monitoring   -  you have had progressive decline in lung function and o2 need -This is due to progressive pulmonary fibrosis and also very likely due to development of pulmonary hypertension -Currently using 3 L oxygen 24/7 using a tank system  Plan - Check pulse ox on room air at rest -> then do oxygen titration test with 4 L oxygen to qualify for portable oxygen system -Continue Enbrel and methotrexate through [facility] -Start low-dose nintedanib protocol 100 mg twice daily antifibrotic  -Discussed need for monthly liver function test monitoring  = Discussed that you should take it with food  -Discussed side effect profile of diarrhea, weight loss and low appetite and very rare cardiac risk -Await results of echocardiogram and based on this he might need right heart catheterization  Hiatal hernia  Weight  loss  -Hiatal hernia is now described as large and could be contributing to acid reflux which would be contributing to worsening interstitial lung disease  Plan - Glad you are going to see GI specialist -Continue acid reflux therapy  Follow-up - 4-6 weeks with Dr. Chase Caller 30-minute visit; to discuss echo results and discuss neck step  ( Level 05 visit: Estb 40-54 min n  visit type: on-site physical face to visit  in total care time and counseling or/and coordination of care by this undersigned MD - Dr Brand Males. This includes one or more of the following on this same day 12/25/2022: pre-charting, chart review, note writing, documentation discussion of test results, diagnostic or treatment recommendations, prognosis, risks and benefits of management options, instructions, education, compliance or risk-factor reduction. It excludes time spent by the [facility] or office staff in the care of the patient. Actual time 79 min)   SIGNATURE    Dr. Brand Males, M.D., F.C.C.P,  Pulmonary and Critical Care Medicine Staff Physician, [place] Director - Interstitial Lung Disease  Program  Pulmonary [place] at [place], [place], [address]  Pager: [phone], If no answer or between  15:00h - 7:00h: call [phone] Telephone: [phone]  10:54 AM 12/25/2022

## 2022-12-25 NOTE — Patient Instructions (Addendum)
Interstitial lung disease due to connective tissue disease (HCC) Chronic respiratory failure with hypoxia (HCC) Immunosuppression due to drug therapy ([place]) Encounter for medication monitoring   -  you have had progressive decline in lung function and o2 need -This is due to progressive pulmonary fibrosis and also very likely due to development of pulmonary hypertension -Currently using 3 L oxygen 24/7 using a tank system  Plan - Check pulse ox on room air at rest -> then do oxygen titration test with 4 L oxygen to qualify for portable oxygen system -Continue Enbrel and methotrexate through [facility] -Start low-dose nintedanib protocol 100 mg twice daily antifibrotic  -Discussed need for monthly liver function test monitoring  = Discussed that you should take it with food  -Discussed side effect profile of diarrhea, weight loss and low appetite and very rare cardiac risk -Await results of echocardiogram and based on this he might need right heart catheterization  Hiatal hernia  Weight loss  -Hiatal hernia is now described as large and could be contributing to acid reflux which would be contributing to worsening interstitial lung disease  Plan - Glad you are going to see GI specialist -Continue acid reflux therapy  Follow-up - 4-6 weeks with Dr. Chase Caller 30-minute visit; to discuss echo results and discuss neck step

## 2022-12-31 ENCOUNTER — Other Ambulatory Visit: Payer: Self-pay | Admitting: Obstetrics and Gynecology

## 2022-12-31 DIAGNOSIS — Z1231 Encounter for screening mammogram for malignant neoplasm of breast: Secondary | ICD-10-CM

## 2023-01-01 ENCOUNTER — Telehealth: Payer: Self-pay | Admitting: Internal Medicine

## 2023-01-01 NOTE — Telephone Encounter (Signed)
  Pt called, she said, her lung doctor prescribed her new medication and she is concerned about the side effect. She said, she doesn't know the name of the medication and will come in tomorrow to dropped off the notes from her lung doctor

## 2023-01-01 NOTE — Telephone Encounter (Signed)
Spoke with patient and she wanted Dr. Harrington Challenger to be aware of her visit with pulmonology and for her to read his plan of care. He is starting her on a low dose nintedanib protocol 100 mg twice daily and she is concerned with the side effects. She stated she already has bowel issues so that is a concern for her. She also states the provider stated that its no cardiac risk, but she would like your advice.

## 2023-01-02 ENCOUNTER — Telehealth: Payer: Self-pay | Admitting: Internal Medicine

## 2023-01-02 ENCOUNTER — Ambulatory Visit (HOSPITAL_COMMUNITY): Payer: Medicare Other | Attending: Adult Health

## 2023-01-02 DIAGNOSIS — I270 Primary pulmonary hypertension: Secondary | ICD-10-CM | POA: Insufficient documentation

## 2023-01-02 LAB — ECHOCARDIOGRAM COMPLETE
Area-P 1/2: 3.19 cm2
MV VTI: 1.95 cm2
S' Lateral: 2.2 cm

## 2023-01-02 NOTE — Telephone Encounter (Signed)
Per Dr Chase Caller note:    you have had progressive decline in lung function and o2 need -This is due to progressive pulmonary fibrosis and also very likely due to development of pulmonary hypertension -Currently using 3 L oxygen 24/7 using a tank system  The med dose is a low starting dose, and no contraindication with her cardiac meds.     Left a message for the pt to call back.

## 2023-01-02 NOTE — Telephone Encounter (Signed)
Paper Work Dropped Off: in providers box   Date: 01/02/2023  Location of paper:  in envelope, in providers box

## 2023-01-02 NOTE — Telephone Encounter (Signed)
Pt husband dropped off OFEV application , left it in [employer]

## 2023-01-02 NOTE — Telephone Encounter (Signed)
I spoke with the pt and her biggest concern is the GI side effects and she already has loose bowels... she will go ahead and try it and will reach out to Dr Chase Caller if she has any further difficulties.   Pt asks that I make Dr Harrington Challenger aware of her Pulmonary status.

## 2023-01-03 ENCOUNTER — Telehealth: Payer: Self-pay | Admitting: Pharmacist

## 2023-01-03 DIAGNOSIS — M1A09X0 Idiopathic chronic gout, multiple sites, without tophus (tophi): Secondary | ICD-10-CM | POA: Diagnosis not present

## 2023-01-03 DIAGNOSIS — M0579 Rheumatoid arthritis with rheumatoid factor of multiple sites without organ or systems involvement: Secondary | ICD-10-CM | POA: Diagnosis not present

## 2023-01-03 NOTE — Telephone Encounter (Signed)
Noted. Will await return from Dr. Chase Caller. Will initiate Ofev BIV now  Knox Saliva, PharmD, MPH, BCPS, CPP Clinical Pharmacist (Rheumatology and Pulmonology)

## 2023-01-03 NOTE — Telephone Encounter (Signed)
Received notification that pt will be new start to Ofev. Paperwork was placed in Dr. Golden Pop mailbox by front desk  Dose: low-dose '100mg'$  twice dialy  Submitted a Prior Authorization request to [facility] for OFEV via CoverMyMeds. Will update once we receive a response.  Key: Caffie Pinto, PharmD, MPH, BCPS, CPP Clinical Pharmacist (Rheumatology and Pulmonology)

## 2023-01-04 ENCOUNTER — Ambulatory Visit
Admission: RE | Admit: 2023-01-04 | Discharge: 2023-01-04 | Disposition: A | Discharge status: Home / Self Care | Payer: Medicare Other | Source: Ambulatory Visit | Attending: Obstetrics and Gynecology | Admitting: Obstetrics and Gynecology

## 2023-01-04 ENCOUNTER — Other Ambulatory Visit (HOSPITAL_COMMUNITY): Payer: Self-pay

## 2023-01-04 DIAGNOSIS — Z1231 Encounter for screening mammogram for malignant neoplasm of breast: Secondary | ICD-10-CM

## 2023-01-04 NOTE — Telephone Encounter (Signed)
Received notification from [facility] regarding a prior authorization for [place]. Authorization has been APPROVED from 01/03/23 to 01/03/24. Approval letter sent to scan center. (While we await return of PAP docuents from Dr. Chase Caller)  Per test claim, copay for 30 days supply is $3300.26 Authorization # [id number] Phone: [phone]  Will need to submit PAP once forms are received from Dr. Chase Caller.  Knox Saliva, PharmD, MPH, BCPS, CPP Clinical Pharmacist (Rheumatology and Pulmonology)

## 2023-01-07 ENCOUNTER — Telehealth: Payer: Self-pay | Admitting: Internal Medicine

## 2023-01-07 NOTE — Telephone Encounter (Signed)
PT calling about call rec'd last week. Read notes to her regarding OFEV (See encounters). She would still like a CB to be sure this was why the call came thru. Pls call @ [phone]

## 2023-01-07 NOTE — Telephone Encounter (Signed)
Pt said she received a call Friday 3/15 from our office. Checking with pharmacy team to see if they might've tried to call pt. Please advise.

## 2023-01-07 NOTE — Telephone Encounter (Signed)
Spoke with patient. She advises she currently has a cardiologist who she stays in contact with. Patient advises she took enbrel before her Echo and she wants to know if this would make echo have an abnormal reading.  Tammy can you please advise?

## 2023-01-07 NOTE — Telephone Encounter (Signed)
I am not aware of any side effects such as this from Enbrel. Please put a referral for cardiology she sees Dr. Harrington Challenger

## 2023-01-07 NOTE — Telephone Encounter (Signed)
Pt had dropped her AVS from Pulmonary... note had been taken from the pt and Dr Chase Caller as cc'd his OV note to Dr Harrington Challenger... will close this encounter.

## 2023-01-07 NOTE — Telephone Encounter (Signed)
Called and spoke with pt letting her know the info per Tammy and stated to her to check with Dr. Harrington Challenger. Pt said she did check with Dr. Harrington Challenger and she said that she didn't know if there were any such side effects. Nothing further needed.

## 2023-01-08 ENCOUNTER — Ambulatory Visit: Payer: Medicare Other | Admitting: Internal Medicine

## 2023-01-11 DIAGNOSIS — I700 Atherosclerosis of aorta: Secondary | ICD-10-CM | POA: Diagnosis not present

## 2023-01-11 DIAGNOSIS — E11319 Type 2 diabetes mellitus with unspecified diabetic retinopathy without macular edema: Secondary | ICD-10-CM | POA: Diagnosis not present

## 2023-01-11 DIAGNOSIS — M069 Rheumatoid arthritis, unspecified: Secondary | ICD-10-CM | POA: Diagnosis not present

## 2023-01-11 DIAGNOSIS — J849 Interstitial pulmonary disease, unspecified: Secondary | ICD-10-CM | POA: Diagnosis not present

## 2023-01-11 NOTE — Telephone Encounter (Signed)
Pharmacy team did not reach out to patient as far as I know from our notes  Knox Saliva, PharmD, MPH, BCPS, CPP Clinical Pharmacist (Rheumatology and Pulmonology)

## 2023-01-15 NOTE — Telephone Encounter (Signed)
Income documents have been received . Submitted Patient Assistance Application to [employer] for OFEV along with provider portion, patient portion, med list, insurance card copy, PA and income documents. Will update patient when we receive a response.  Phone #: [phone] Fax #: [phone]   Knox Saliva, PharmD, MPH, BCPS, CPP Clinical Pharmacist (Rheumatology and Pulmonology)

## 2023-01-21 NOTE — Telephone Encounter (Signed)
Received fax from [facility] for [facility] patient assistance, patient's application has been DENIED due to income exceeding current program eligibility limits. The decision is final (there is no opportunity to appeal).    Phone #: [phone] Fax #: [phone]  ATC patient to discuss - unable to reach. Lfet VM requesting return clal  Knox Saliva, PharmD, MPH, BCPS, CPP Clinical Pharmacist (Rheumatology and Pulmonology)

## 2023-01-22 ENCOUNTER — Telehealth: Payer: Self-pay

## 2023-01-22 NOTE — Telephone Encounter (Signed)
Called and offered the pt an appt for today but she declined.Marland Kitchenand notes that she needs 24 hour notice.. I advised her that I will find a spot for her and call her back.

## 2023-01-22 NOTE — Telephone Encounter (Signed)
-----   Message from Fay Records, MD sent at 01/16/2023  3:12 PM EDT ----- Please set pt up to see me, discuss echo and possible R heart catheterization

## 2023-01-23 ENCOUNTER — Encounter: Payer: Self-pay | Admitting: Obstetrics & Gynecology

## 2023-01-23 ENCOUNTER — Other Ambulatory Visit: Payer: Self-pay

## 2023-01-23 ENCOUNTER — Other Ambulatory Visit: Payer: Self-pay | Admitting: *Deleted

## 2023-01-23 ENCOUNTER — Inpatient Hospital Stay: Payer: Medicare Other | Attending: Obstetrics & Gynecology | Admitting: Obstetrics & Gynecology

## 2023-01-23 VITALS — BP 123/72 | HR 80 | Temp 97.6°F | Resp 16 | Ht <= 58 in | Wt 149.0 lb

## 2023-01-23 DIAGNOSIS — Z9981 Dependence on supplemental oxygen: Secondary | ICD-10-CM | POA: Diagnosis not present

## 2023-01-23 DIAGNOSIS — Z8542 Personal history of malignant neoplasm of other parts of uterus: Secondary | ICD-10-CM | POA: Diagnosis not present

## 2023-01-23 DIAGNOSIS — N3941 Urge incontinence: Secondary | ICD-10-CM | POA: Diagnosis not present

## 2023-01-23 DIAGNOSIS — I272 Pulmonary hypertension, unspecified: Secondary | ICD-10-CM

## 2023-01-23 DIAGNOSIS — J984 Other disorders of lung: Secondary | ICD-10-CM | POA: Insufficient documentation

## 2023-01-23 DIAGNOSIS — C541 Malignant neoplasm of endometrium: Secondary | ICD-10-CM

## 2023-01-23 NOTE — Assessment & Plan Note (Signed)
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  Negative symptom review, normal exam.  No evidence of recurrence     Plan > Following the exam with Dr. Philis Pique in October 2024, annual follow-up with Dr. Philis Pique is appropriate

## 2023-01-23 NOTE — Progress Notes (Signed)
Follow Up Note: Gyn-Onc  Madison Holden 77 y.o. female  CC: She presents for a f/u visit.   HPI: The oncology history was reviewed.  Interval History: She denies any vaginal bleeding, abdominal/pelvic pain, cough, lethargy or abdominal distention.  In the interim she has been diagnosed with restrictive lung disease now oxygen dependent.  Longstanding urge incontinence controlled with an anticholinergic med.  Review of Systems  Review of Systems  Constitutional:  Negative for malaise/fatigue and weight loss. .   Cardiovascular:  Negative for chest pain and leg swelling.  Gastrointestinal:  Negative for abdominal pain, blood in stool, constipation, nausea and vomiting.  Genitourinary:  Negative for dysuria, frequency, hematuria and urgency.  Musculoskeletal:  Negative for joint pain and myalgias.  Neurological:  Negative for weakness.  Psychiatric/Behavioral:  Negative for depression. The patient does not have insomnia.    Current medications, allergy, social history, past surgical history, past medical history, family history were all reviewed.    Vitals:  BP 123/72 (BP Location: Right Arm, Patient Position: Sitting)   Pulse 80   Temp 97.6 F (36.4 C) (Oral)   Resp 16   Ht 4\' 10"  (1.473 m)   Wt 149 lb (67.6 kg)   SpO2 99%   BMI 31.14 kg/m    Physical Exam:  Physical Exam Exam conducted with a chaperone present.  Constitutional:      General: She is not in acute distress. Cardiovascular:     Rate and Rhythm: Normal rate and regular rhythm. .  Abdominal:     Palpations: Abdomen is soft.     Tenderness: There is no abdominal tenderness. There is no right CVA tenderness or left CVA tenderness.     Hernia: No hernia is present.  Genitourinary:    General: Normal vulva.     Urethra: No urethral lesion.     Vagina: No lesions. No bleeding Musculoskeletal:     Cervical back: Neck supple.     Right lower leg: No edema.     Left lower leg: No edema.  Lymphadenopathy:      Upper Body:     Right upper body: No supraclavicular adenopathy.     Left upper body: No supraclavicular adenopathy.     Lower Body: No right inguinal adenopathy. No left inguinal adenopathy.  Skin:    Findings: No rash.  Neurological:     Mental Status: She is oriented to person, place, and time.   Assessment/Plan:  Endometrial carcinoma ([place]) 77 yo w/Stage IA grade 1 endometrioid endometrial cancer, s/p staging 07/22/18, MSI stable.  Negative symptom review, normal exam.  No evidence of recurrence     Plan > Following the exam with Dr. Philis Pique in October 2024, annual follow-up with Dr. Philis Pique is appropriate  Endometrial cancer [facility]) 77yo w/Stage IA grade 1 endometrioid endometrial cancer, s/p staging 07/22/18, MSI stable.  Negative symptom review, normal exam.  No evidence of recurrence     Plan > continue to have follow-up every 6 months for 5 years in accordance with NCCN guidelines.  >continued follow up with Dr Philis Pique is appropriate; recommend an exam with symptom review in 6 mos followed by annual visits     I personally spent 25 minutes face-to-face and non-face-to-face in the care of this patient, which includes all pre, intra, and post visit time on the date of service.    Lahoma Crocker, MD

## 2023-01-23 NOTE — Assessment & Plan Note (Signed)
77yo w/Stage IA grade 1 endometrioid endometrial cancer, s/p staging 07/22/18, MSI stable.  Negative symptom review, normal exam.  No evidence of recurrence     Plan > continue to have follow-up every 6 months for 5 years in accordance with NCCN guidelines.  >continued follow up with Dr Philis Pique is appropriate; recommend an exam with symptom review in 6 mos followed by annual visits

## 2023-01-23 NOTE — Progress Notes (Signed)
Patient has not been seen for pulmonary hypertension.  Referral to cardiology (Dr. Harrington Challenger) placed to evaluate for pulmonary hypertension.

## 2023-01-23 NOTE — Telephone Encounter (Addendum)
Returned call to patient to discuss Ofev PAP determination. Unable to reach. Left VM requesting reutnr clal  Knox Saliva, PharmD, MPH, BCPS, CPP Clinical Pharmacist (Rheumatology and Pulmonology)

## 2023-01-23 NOTE — Addendum Note (Signed)
Addended by: Vanessa Barbara on: 01/23/2023 04:22 PM   Modules accepted: Orders

## 2023-01-23 NOTE — Progress Notes (Signed)
01/25/2023 Madison Holden [id number] 12/25/1945  Referring provider: Henrine Screws, MD Primary GI doctor: Dr. Rhea Belton  ASSESSMENT AND PLAN:   Hiatal hernia with dysphagia, no true reflux symptoms per patient Patient has multiple comorbid conditions, worsening ILD, pulmonary hypertension, chronic respiratory failure with hypoxia on 3 L nasal cannula, rheumatoid arthritis and morbid obesity, due to these comorbid conditions patient's very high risk for endoscopic procedures and I do believe it would be more harm than benefit for the patient to undergo any endoscopic procedures or considering surgery to correct hiatal hernia were uncertain how much this is affecting her respiratory status with pulmonary hypertension being evaluated. Will get upper GI with dysphagia to evaluate hernia, evaluate for any ulcers, masses, strictures or motility issues. Will maximize medications increasing Nexium 40 mg to every day 30 minutes to an hour before food, Pepcid at night.  Consider increasing to twice daily but patient does not complain of much reflux.  ILD (interstitial lung disease) with chronic respiratory failure with hypoxia, pulmonary hypertension Getting workup with pulmonary and cardiology at this time to try to maximize medications for her lungs, worsening respiratory status since November, currently on continuous oxygen 3 L decreased ADLs, decreased movement, shortness of breath even with talking. Continue follow-up with cardiology/pulmonary and maximize respiratory status  Morbid obesity Weight loss discussed with the patient, may potentially help hiatal hernia.  Connective tissue disease Continue follow-up with rheumatologist    Patient Care Team: Henrine Screws, MD as PCP - General (Family Medicine)  HISTORY OF PRESENT ILLNESS: 77 y.o. female with a past medical history of ILD, PHTN, chronic hypoxic respiratory failure on 3 L, DM, RA, history of endometrial cancer, morbid  obesity, and others listed below presents for evaluation of hiatal hernia.  Previously known to Dr. Juanda Chance in 2016  12/29/2014 colonoscopy Dr. Juanda Chance for screening purposes good prep completely normal colonoscopy recall 10 years She walks with cane or rolline walkers, states last Oct/Nov 2023 everything started to get worse with her breathing.  11/12/2022 CT angio chest for elevated D-dimer and shortness of breath was negative for PE but did show pulmonary arterial hypertension in setting of interstitial lung disease as well as large hiatal hernia, nodule area of increased density near the GE junction started by greater density.  Could represent ingested material.  Exclude underlying lesion in that location. 01/02/2023 echocardiogram EF 60-65%, elevated RASP consistent with pulmonary hypertension, moderate regurg pulmonic valve, following up with Dr. Tenny Craw for potential right heart cath for evaluation of pulmonary hypertension. Has history of interstitial lung disease, follows with Dr. Marchelle Gearing, has desaturations with exertion, hypoxemia at night, she is now on continuous O2 3L  She has SOB with exertion, now on oxygen.  Getting work up with Dr. Tenny Craw for RHC and Dr. Marchelle Gearing.  She is on nexium 3 x a week, denies GERD.  She has significant coughing in Dec, clear mucus.  She denies nausea, vomiting.  She has dysphagia with rice and chicken breast mainly, upper esophagus, comes and goes, not consistent, no issues with liquids.  No melena, no hematochezia.  .   She  reports that she has never smoked. She has never used smokeless tobacco. She reports that she does not currently use alcohol. She reports that she does not use drugs.  RELEVANT LABS AND IMAGING: CBC    Component Value Date/Time   WBC 8.0 11/07/2022 1125   RBC 3.51 (L) 11/07/2022 1125   HGB 12.0 11/07/2022 1125   HCT 36.6  11/07/2022 1125   PLT 213.0 11/07/2022 1125   MCV 104.3 (H) 11/07/2022 1125   MCH 27.4 07/23/2018 0341   MCHC  32.8 11/07/2022 1125   RDW 18.2 (H) 11/07/2022 1125   LYMPHSABS 1.0 11/07/2022 1125   MONOABS 0.1 11/07/2022 1125   EOSABS 0.5 11/07/2022 1125   BASOSABS 0.0 11/07/2022 1125   Recent Labs    11/07/22 1125  HGB 12.0     CMP     Component Value Date/Time   NA 133 (L) 11/07/2022 1125   K 3.5 11/07/2022 1125   CL 100 11/07/2022 1125   CO2 24 11/07/2022 1125   GLUCOSE 96 11/07/2022 1125   BUN 29 (H) 11/07/2022 1125   CREATININE 0.91 11/07/2022 1125   CALCIUM 9.1 11/07/2022 1125   PROT 6.5 08/15/2022 1636   PROT 7.1 04/18/2020 0735   ALBUMIN 3.8 08/15/2022 1636   ALBUMIN 4.3 04/18/2020 0735   AST 30 08/15/2022 1636   ALT 16 08/15/2022 1636   ALKPHOS 81 08/15/2022 1636   BILITOT 1.0 08/15/2022 1636   BILITOT 0.5 04/18/2020 0735   GFRNONAA 60 (L) 07/23/2018 0341   GFRAA >60 07/23/2018 0341      Latest Ref Rng & Units 08/15/2022    4:36 PM 04/18/2020    7:35 AM 01/07/2020    8:45 AM  Hepatic Function  Total Protein 6.5 - 8.1 g/dL 6.5  7.1  6.7   Albumin 3.5 - 5.0 g/dL 3.8  4.3  4.0   AST 15 - 41 U/L 30  60  56   ALT 0 - 44 U/L 16  45  54   Alk Phosphatase 38 - 126 U/L 81  103  114   Total Bilirubin 0.3 - 1.2 mg/dL 1.0  0.5  0.6   Bilirubin, Direct 0.0 - 0.2 mg/dL 0.2  1.610.15  0.960.18       Current Medications:   Current Outpatient Medications (Endocrine & Metabolic):    pioglitazone (ACTOS) 30 MG tablet, Take 30 mg by mouth daily.   Current Outpatient Medications (Cardiovascular):    amLODipine (NORVASC) 2.5 MG tablet, TAKE 1 TABLET BY MOUTH ONCE DAILY   hydrochlorothiazide (HYDRODIURIL) 25 MG tablet, Take 12.5 mg by mouth daily.   rosuvastatin (CRESTOR) 5 MG tablet, Take 1 tablet (5 mg total) by mouth daily. Start with one tablet every Monday, Wednesday, and Friday. Increase to one tablet daily if tolerating well.  Current Outpatient Medications (Respiratory):    diphenhydrAMINE (BENADRYL) 25 MG tablet, Take 25 mg by mouth at bedtime as needed for sleep.   Current  Outpatient Medications (Analgesics):    acetaminophen (TYLENOL) 650 MG CR tablet, Take 650 mg by mouth every 8 (eight) hours as needed for pain.   allopurinol (ZYLOPRIM) 300 MG tablet, Take 300 mg by mouth daily.   ASA-APAP-Caff Buffered (VANQUISH PO), Take 2 tablets by mouth 2 (two) times a week.   etanercept (ENBREL) 50 MG/ML injection, Inject 25 mg into the skin 2 (two) times a week.   ibuprofen (ADVIL) 200 MG tablet,   Current Outpatient Medications (Hematological):    Ferrous Gluconate 324 (37.5 Fe) MG TABS, Take 1 tablet by mouth 2 (two) times daily.   folic acid (FOLVITE) 800 MCG tablet, Take 800 mcg by mouth daily.   vitamin B-12 (CYANOCOBALAMIN) 1000 MCG tablet, Take 1,000 mcg by mouth daily.  Current Outpatient Medications (Other):    B-D TB SYRINGE 1CC/27GX1/2" 27G X 1/2" 1 ML MISC, USE WEEKLY WITH METHOTREXATE SUBCUTANEOUSLY 90 DAYS  Baclofen 5 MG TABS, Take 0.5-1 tablets by mouth at bedtime as needed.   esomeprazole (NEXIUM) 40 MG capsule, Take 40 mg by mouth 3 (three) times a week.   ketoconazole (NIZORAL) 2 % cream, SMARTSIG:sparingly Topical Twice Daily   Magnesium 500 MG TABS, Take 500 mg by mouth daily.   Melatonin 3 MG TABS, Take 3-6 mg by mouth at bedtime as needed (sleep).    methotrexate 250 MG/10ML injection, once a week. 0.5 ml   oxybutynin (DITROPAN) 5 MG tablet, Take 5 mg by mouth 2 (two) times daily.   Turmeric 500 MG CAPS, Take 500 mg by mouth daily.   vitamin C (ASCORBIC ACID) 500 MG tablet, Take 500 mg by mouth daily.  Medical History:  Past Medical History:  Diagnosis Date   Ambulates with cane    due to gout    Anemia    Arthritis    hands   Arthritis    Cancer, uterine    Chest pain, unspecified 07-01-2018  per pt had 2 day chest pain and sob  6 weeks ago,, per pt no symptoms since   cardiology-- dr Consuello Bossier--  scheduled for stress test and echo 07-03-2018 prior to surgery scheduled 07-08-2018   Diarrhea, unspecified    intermittant   DOE  (dyspnea on exertion)    Endometrial cancer    Essential hypertension    Frequency of urination    GERD (gastroesophageal reflux disease)    Gout rheumologist-- michelle young PA ([facility])   07-01-2018 per pt flare up april 2019 , hands and wrists   Heart murmur    History of adenomatous polyp of colon    History of esophageal dilatation    for stricture   Mild nausea    per pt intermittant , no vomiting   Mixed dyslipidemia    Muscle weakness of extremity    bilateral upper and lower extremities due to gout   Poor appetite    per pt    Type 2 diabetes mellitus    followed by pcp   Urgency of urination    Urine frequency    Allergies:  Allergies  Allergen Reactions   Lovastatin    Rosuvastatin Other (See Comments)     Surgical History:  She  has a past surgical history that includes Mouth surgery (11/2017); Cataract extraction w/ intraocular lens  implant, bilateral (2014;  2017); Tonsillectomy (child); Breast cyst excision (Right, 1974); Robotic assisted total hysterectomy with bilateral salpingo oophorectomy (Bilateral, 07/22/2018); Video bronchoscopy (N/A, 06/27/2021); and Bronchial washings (06/27/2021). Family History:  Her family history includes Clotting disorder in her father; Diabetes in her maternal grandfather; Heart disease in her father and mother; Heart failure in her mother; Prostate cancer in her father.  REVIEW OF SYSTEMS  : All other systems reviewed and negative except where noted in the History of Present Illness.  PHYSICAL EXAM: BP 118/62   Pulse 65   Ht 4\' 10"  (1.473 m)   Wt 150 lb (68 kg)   BMI 31.35 kg/m  General Appearance: Obese, chronically ill appearing, in no apparent distress. Head:   Normocephalic and atraumatic. Eyes:  sclerae anicteric,conjunctive pink, bluish lips. Respiratory: Respiratory effort nincreased, 3 L [place], SOB with talking, diffuse crackles. Cardio: RRR with slight murmur. Peripheral pulses intact.  Abdomen: Soft,   Obese ,active bowel sounds. No tenderness . Without guarding and Without rebound. No masses. Rectal: Not evaluated Musculoskeletal: Full ROM, Antalgic and Ambulates with walker gait. With edema. Skin:  Dry and  intact without significant lesions or rashes Neuro: Alert and  oriented x4;  No focal deficits. Psych:  Cooperative. Normal mood and affect.    Doree Albee, PA-C 11:01 AM

## 2023-01-24 ENCOUNTER — Other Ambulatory Visit (HOSPITAL_COMMUNITY): Payer: Self-pay

## 2023-01-24 NOTE — Telephone Encounter (Signed)
Called the pt and advised her that the PCC/ Chloe and I are trying to work on an appt time for her for next Thursday 01/31/23 but she declines the morning and says she has limited afternoon time that she can come in.   I replied that I will continue to work on it and get back to her again soon.

## 2023-01-24 NOTE — Telephone Encounter (Signed)
Spoke with patient's husband of max $3500 out of pocket max for rx drug. We will provide a one month supply of donor Ofev to see tolerability before they dole out the lump sum of money for medication.  They will stop by next week to pick up in afternoon at some point  Knox Saliva, PharmD, MPH, BCPS, CPP Clinical Pharmacist (Rheumatology and Pulmonology)

## 2023-01-24 NOTE — Telephone Encounter (Signed)
Patient's husband Madison Holden stopped by the office. Would like to know what the copay is for Ofev. Received a rejection letter from [facility]. [name] phone number is [phone].May leave detailed message on voicemail.

## 2023-01-25 ENCOUNTER — Ambulatory Visit: Payer: Medicare Other | Admitting: Physician Assistant

## 2023-01-25 ENCOUNTER — Encounter: Payer: Self-pay | Admitting: Physician Assistant

## 2023-01-25 VITALS — BP 118/62 | HR 65 | Ht <= 58 in | Wt 150.0 lb

## 2023-01-25 DIAGNOSIS — J9611 Chronic respiratory failure with hypoxia: Secondary | ICD-10-CM | POA: Diagnosis not present

## 2023-01-25 DIAGNOSIS — K449 Diaphragmatic hernia without obstruction or gangrene: Secondary | ICD-10-CM | POA: Diagnosis not present

## 2023-01-25 DIAGNOSIS — J849 Interstitial pulmonary disease, unspecified: Secondary | ICD-10-CM | POA: Diagnosis not present

## 2023-01-25 DIAGNOSIS — R1314 Dysphagia, pharyngoesophageal phase: Secondary | ICD-10-CM

## 2023-01-25 DIAGNOSIS — K219 Gastro-esophageal reflux disease without esophagitis: Secondary | ICD-10-CM

## 2023-01-25 DIAGNOSIS — M359 Systemic involvement of connective tissue, unspecified: Secondary | ICD-10-CM

## 2023-01-25 MED ORDER — ESOMEPRAZOLE MAGNESIUM 40 MG PO CPDR: 40.0000 mg | DELAYED_RELEASE_CAPSULE | Freq: Every day | ORAL | 3 refills | Status: DC

## 2023-01-25 MED ORDER — FAMOTIDINE 40 MG PO TABS: 40.0000 mg | ORAL_TABLET | Freq: Every day | ORAL | 3 refills | Status: DC

## 2023-01-25 NOTE — Patient Instructions (Addendum)
Silent reflux: Not all heartburn burns...Marland KitchenMarland KitchenMarland Kitchen  INCREASE THE NEXIUM TO EVERY DAY 30 MINS TO 1 HOUR BEFORE FOOD EVERY MORNING TAKE THE PEPCID/FAMOTIDINE AT NIGHT STOP THE TUMERIC CAN WORSEN REFLUX  What is LPR? Laryngopharyngeal reflux (LPR) or silent reflux is a condition in which acid that is made in the stomach travels up the esophagus (swallowing tube) and gets to the throat. Not everyone with reflux has a lot of heartburn or indigestion. In fact, many people with LPR never have heartburn. This is why LPR is called SILENT REFLUX, and the terms "Silent reflux" and "LPR" are often used interchangeably. Because LPR is silent, it is sometimes difficult to diagnose.  How can you tell if you have LPR?  Chronic hoarseness- Some people have hoarseness that comes and goes throat clearing  Cough It can cause shortness of breath and cause asthma like symptoms. a feeling of a lump in the throat  difficulty swallowing a problem with too much nose and throat drainage.  Some people will feel their esophagus spasm which feels like their heart beating hard and fast, this will usually be after a meal, at rest, or lying down at night.    How do I treat this? Treatment for LPR should be individualized, and your doctor will suggest the best treatment for you. Generally there are several treatments for LPR: changing habits and diet to reduce reflux,  medications to reduce stomach acid, and  surgery to prevent reflux. Most people with LPR need to modify how and when they eat, as well as take some medication, to get well. Sometimes, nonprescription liquid antacids, such as Maalox, Gelucil and Mylanta are recommended. When used, these antacids should be taken four times each day - one tablespoon one hour after each meal and before bedtime. Dietary and lifestyle changes alone are not often enough to control LPR - medications that reduce stomach acid are also usually needed. These must be prescribed by our  doctor.   TIPS FOR REDUCING REFLUX AND LPR Control your LIFE-STYLE and your DIET! If you use tobacco, QUIT.  Smoking makes you reflux. After every cigarette you have some LPR.  Don't wear clothing that is too tight, especially around the waist (trousers, corsets, belts).  Do not lie down just after eating...in fact, do not eat within three hours of bedtime.  You should be on a low-fat diet.  Limit your intake of red meat.  Limit your intake of butter.  Avoid fried foods.  Avoid chocolate  Avoid cheese.  Avoid eggs. Specifically avoid caffeine (especially coffee and tea), soda pop (especially cola) and mints.  Avoid alcoholic beverages, particularly in the evening.  Hiatal Hernia  A hiatal hernia occurs when part of the stomach slides above the muscle that separates the abdomen from the chest (diaphragm). A person can be born with a hiatal hernia (congenital), or it may develop over time. In almost all cases of hiatal hernia, only the top part of the stomach pushes through the diaphragm. Many people have a hiatal hernia with no symptoms. The larger the hernia, the more likely it is that you will have symptoms. In some cases, a hiatal hernia allows stomach acid to flow back into the tube that carries food from your mouth to your stomach (esophagus). This may cause heartburn symptoms. The development of heartburn symptoms may mean that you have a condition called gastroesophageal reflux disease (GERD). What are the causes? This condition is caused by a weakness in the opening (hiatus) where the  esophagus passes through the diaphragm to attach to the upper part of the stomach. A person may be born with a weakness in the hiatus, or a weakness can develop over time. What increases the risk? This condition is more likely to develop in: Older people. Age is a major risk factor for a hiatal hernia, especially if you are over the age of 35. Pregnant women. People who are overweight. People who  have frequent constipation. What are the signs or symptoms? Symptoms of this condition usually develop in the form of GERD symptoms. Symptoms include: Heartburn. Upset stomach (indigestion). Trouble swallowing. Coughing or wheezing. Wheezing is making high-pitched whistling sounds when you breathe. Sore throat. Chest pain. Nausea and vomiting. How is this diagnosed? This condition may be diagnosed during testing for GERD. Tests that may be done include: X-rays of your stomach or chest. An upper gastrointestinal (GI) series. This is an X-ray exam of your GI tract that is taken after you swallow a chalky liquid that shows up clearly on the X-ray. Endoscopy. This is a procedure to look into your stomach using a thin, flexible tube that has a tiny camera and light on the end of it. How is this treated? This condition may be treated by: Dietary and lifestyle changes to help reduce GERD symptoms. Medicines. These may include: Over-the-counter antacids. Medicines that make your stomach empty more quickly. Medicines that block the production of stomach acid (H2 blockers). Stronger medicines to reduce stomach acid (proton pump inhibitors). Surgery to repair the hernia, if other treatments are not helping. If you have no symptoms, you may not need treatment. Follow these instructions at home: Lifestyle and activity Do not use any products that contain nicotine or tobacco. These products include cigarettes, chewing tobacco, and vaping devices, such as e-cigarettes. If you need help quitting, ask your health care provider. Try to achieve and maintain a healthy body weight. Avoid putting pressure on your abdomen. Anything that puts pressure on your abdomen increases the amount of acid that may be pushed up into your esophagus. Avoid bending over, especially after eating. Raise the head of your bed by putting blocks under the legs. This keeps your head and esophagus higher than your stomach. Do not  wear tight clothing around your chest or stomach. Try not to strain when having a bowel movement, when urinating, or when lifting heavy objects. Eating and drinking Avoid foods that can worsen GERD symptoms. These may include: Fatty foods, like fried foods. Citrus fruits, like oranges or lemon. Other foods and drinks that contain acid, like orange juice or tomatoes. Spicy food. Chocolate. Eat frequent small meals instead of three large meals a day. This helps prevent your stomach from getting too full. Eat slowly. Do not lie down right after eating. Do not eat 1-2 hours before bed. Do not drink beverages with caffeine. These include cola, coffee, cocoa, and tea. Do not drink alcohol. General instructions Take over-the-counter and prescription medicines only as told by your health care provider. Keep all follow-up visits. Your health care provider will want to check that any new prescribed medicines are helping your symptoms. Contact a health care provider if: Your symptoms are not controlled with medicines or lifestyle changes. You are having trouble swallowing. You have coughing or wheezing that will not go away. Your pain is getting worse. Your pain spreads to your arms, neck, jaw, teeth, or back. You feel nauseous or you vomit. Get help right away if: You have shortness of breath. You vomit  blood. You have bright red blood in your stools. You have black, tarry stools. These symptoms may be an emergency. Get help right away. Call 911. Do not wait to see if the symptoms will go away. Do not drive yourself to the hospital. Summary A hiatal hernia occurs when part of the stomach slides above the muscle that separates the abdomen from the chest. A person may be born with a weakness in the hiatus, or a weakness can develop over time. Symptoms of a hiatal hernia may include heartburn, trouble swallowing, or sore throat. Management of a hiatal hernia includes eating frequent small  meals instead of three large meals a day. Get help right away if you vomit blood, have bright red blood in your stools, or have black, tarry stools. This information is not intended to replace advice given to you by your health care provider. Make sure you discuss any questions you have with your health care provider. Document Revised: 12/05/2021 Document Reviewed: 12/05/2021 Elsevier Patient Education  2023 [employer].   [facility]. Your appointment is on 01/31/2023 at 11:00AM. Please arrive 30 minutes prior to your test for registration. Make sure not to eat or drink anything after midnight on the night before your test. If you need to reschedule, please call radiology at [phone]. ________________________________________________________________ An upper GI series uses x rays to help diagnose problems of the upper GI tract, which includes the esophagus, stomach, and duodenum. The duodenum is the first part of the small intestine. An upper GI series is conducted by a radiology technologist or a radiologist--a doctor who specializes in x-ray imaging--at a hospital or outpatient center. While sitting or standing in front of an x-ray machine, the patient drinks barium liquid, which is often white and has a chalky consistency and taste. The barium liquid coats the lining of the upper GI tract and makes signs of disease show up more clearly on x rays. X-ray video, called fluoroscopy, is used to view the barium liquid moving through the esophagus, stomach, and duodenum. Additional x rays and fluoroscopy are performed while the patient lies on an x-ray table. To fully coat the upper GI tract with barium liquid, the technologist or radiologist may press on the abdomen or ask the patient to change position. Patients hold still in various positions, allowing the technologist or radiologist to take x rays of the upper GI tract at different angles.  If a technologist conducts the upper GI series, a radiologist will later examine the images to look for problems.  This test typically takes about 1 hour to complete. __________________________________________________________________ I appreciate the opportunity to care for you. Quentin MullingAmanda Collier, PA-C

## 2023-01-25 NOTE — Progress Notes (Signed)
Addendum: Reviewed and agree with assessment and management plan for objective imaging without invasive endoscopic procedure which would require sedation. Reesha Debes, Carie Caddy, MD

## 2023-01-28 NOTE — Telephone Encounter (Addendum)
Sample for Ofev has been prepped  Ofev 100mg  # 60 caps  NDC: [address]-66 Lot: [id number] Exp: 10/2023 Directions: take one capsule by mouth twice daily  Chesley Mires, PharmD, MPH, BCPS, CPP Clinical Pharmacist (Rheumatology and Pulmonology)

## 2023-01-29 ENCOUNTER — Ambulatory Visit: Payer: Medicare Other | Attending: Internal Medicine | Admitting: Internal Medicine

## 2023-01-29 ENCOUNTER — Encounter: Payer: Self-pay | Admitting: Internal Medicine

## 2023-01-29 VITALS — BP 116/72 | HR 98 | Ht <= 58 in | Wt 149.6 lb

## 2023-01-29 DIAGNOSIS — I2510 Atherosclerotic heart disease of native coronary artery without angina pectoris: Secondary | ICD-10-CM

## 2023-01-29 DIAGNOSIS — Z01818 Encounter for other preprocedural examination: Secondary | ICD-10-CM

## 2023-01-29 DIAGNOSIS — Z794 Long term (current) use of insulin: Secondary | ICD-10-CM

## 2023-01-29 DIAGNOSIS — E0801 Diabetes mellitus due to underlying condition with hyperosmolarity with coma: Secondary | ICD-10-CM

## 2023-01-29 DIAGNOSIS — Z79899 Other long term (current) drug therapy: Secondary | ICD-10-CM | POA: Diagnosis not present

## 2023-01-29 NOTE — Patient Instructions (Addendum)
Medication Instructions:   *If you need a refill on your cardiac medications before your next appointment, please call your pharmacy*   Lab Work: BMET, CBC, LIPID WIHT If you have labs (blood work) drawn today and your tests are completely normal, you will receive your results only by: MyChart Message (if you have MyChart) OR A paper copy in the mail If you have any lab test that is abnormal or we need to change your treatment, we will call you to review the results.   Testing/Procedures:   [facility] A DEPT OF [place] AT [facility] [address] [place], [place] [address] [id number] [facility] [place] [address] Dept: [phone] Loc: [phone]  Madison Holden  01/29/2023  You are scheduled for a Cardiac Catheterization on Thursday, April 25 with Dr. Alverda Skeans.  1. Please arrive at the [facility] (Main Entrance A) at [facility]: [address] [place], [place] [address] at 7:00 AM (This time is two hours before your procedure to ensure your preparation). Free valet parking service is available.   Special note: Every effort is made to have your procedure done on time. Please understand that emergencies sometimes delay scheduled procedures.  2. Diet: Do not eat solid foods after midnight.  The patient may have clear liquids until 5am upon the day of the procedure.  3. Labs: You will need to have blood drawn on Tuesday February 12, 2023 at [facility] at [facility]. [address], [place]  Open: 7:30am - 5pm    Phone: [phone]. You do not need to be fasting.  4. Medication instructions in preparation for your procedure:   Contrast Allergy: No   On the morning of your procedure, take your Aspirin 81 mg and any morning medicines NOT listed above.  You may use sips of water.  5. Plan to go home the same day, you will only stay overnight if medically necessary. 6. Bring a current list of  your medications and current insurance cards. 7. You MUST have a responsible person to drive you home. 8. Someone MUST be with you the first 24 hours after you arrive home or your discharge will be delayed. 9. Please wear clothes that are easy to get on and off and wear slip-on shoes.  Thank you for allowing [place] to care for you!   -- St[facility]  Invasive Cardiovascular services    Follow-Up: At [facility], you and your health needs are our priority.  As part of our continuing mission to provide you with exceptional heart care, we have created designated Provider Care Teams.  These Care Teams include your primary Cardiologist (physician) and Advanced Practice Providers (APPs -  Physician Assistants and Nurse Practitioners) who all work together to provide you with the care you need, when you need it.  We recommend signing up for the patient portal called "MyChart".  Sign up information is provided on this After Visit Summary.  MyChart is used to connect with patients for Virtual Visits (Telemedicine).  Patients are able to view lab/test results, encounter notes, upcoming appointments, etc.  Non-urgent messages can be sent to your provider as well.   To learn more about what you can do with MyChart, go to [email or web address].

## 2023-01-29 NOTE — H&P (View-Only) (Signed)
 Cardiology Office Note   Date:  01/29/2023   ID:  Tani L Cowley, DOB 04/16/1946, MRN [id number]  PCP:  Thacker, Robert, MD  Cardiologist:   Arieanna Pressey, MD    F/U of HTN     History of Present Illness: Madison Holden is a 77 y.o. female with a history of HTN and DM    Pt also with mild plaquing on chest CT  Myovue in Sept 2019 was normal  Echo showed normal LVEF with mod LVH    The pt had a CT of chest done in July  2022   This showed signficant calcifications of coronary arteries    I saw the pt in June 2023   She was seen by PharmD in A Aug 2023   REcomm Crestor 5 mg MWF to start.   Nexzilet not affordible  DId not want to start Repatha (injection)  The pt is followed in pulmonary by M Ramaswamy   Worsening pulmonary fibrosis.     Had an echo done  RV enlarged   Est PAP 50 mm   Pulm would like R heart cath to confirm pressures, r/o siginficant   Current Meds  Medication Sig   acetaminophen (TYLENOL) 650 MG CR tablet Take 650 mg by mouth every 8 (eight) hours as needed for pain.   allopurinol (ZYLOPRIM) 300 MG tablet Take 300 mg by mouth daily.   amLODipine (NORVASC) 2.5 MG tablet TAKE 1 TABLET BY MOUTH ONCE DAILY   ASA-APAP-Caff Buffered (VANQUISH PO) Take 2 tablets by mouth 2 (two) times a week.   B-D TB SYRINGE 1CC/27GX1/2" 27G X 1/2" 1 ML MISC USE WEEKLY WITH METHOTREXATE SUBCUTANEOUSLY 90 DAYS   Baclofen 5 MG TABS Take 0.5-1 tablets by mouth at bedtime as needed.   diphenhydrAMINE (BENADRYL) 25 MG tablet Take 25 mg by mouth at bedtime as needed for sleep.    esomeprazole (NEXIUM) 40 MG capsule Take 1 capsule (40 mg total) by mouth daily before breakfast.   etanercept (ENBREL) 50 MG/ML injection Inject 25 mg into the skin 2 (two) times a week.   famotidine (PEPCID) 40 MG tablet Take 1 tablet (40 mg total) by mouth at bedtime.   Ferrous Gluconate 324 (37.5 Fe) MG TABS Take 1 tablet by mouth 2 (two) times daily.   folic acid (FOLVITE) 800 MCG tablet Take 800 mcg by mouth  daily.   hydrochlorothiazide (HYDRODIURIL) 25 MG tablet Take 12.5 mg by mouth daily.   ibuprofen (ADVIL) 200 MG tablet    ketoconazole (NIZORAL) 2 % cream SMARTSIG:sparingly Topical Twice Daily   Magnesium 500 MG TABS Take 500 mg by mouth daily.   Melatonin 3 MG TABS Take 3-6 mg by mouth at bedtime as needed (sleep).    methotrexate 250 MG/10ML injection once a week. 0.5 ml   oxybutynin (DITROPAN) 5 MG tablet Take 5 mg by mouth 2 (two) times daily.   pioglitazone (ACTOS) 30 MG tablet Take 30 mg by mouth daily.    rosuvastatin (CRESTOR) 5 MG tablet Take 1 tablet (5 mg total) by mouth daily. Start with one tablet every Monday, Wednesday, and Friday. Increase to one tablet daily if tolerating well. (Patient taking differently: Take 5 mg by mouth 3 (three) times a week. Start with one tablet every Monday, Wednesday, and Friday.)   vitamin B-12 (CYANOCOBALAMIN) 1000 MCG tablet Take 1,000 mcg by mouth daily.   vitamin C (ASCORBIC ACID) 500 MG tablet Take 500 mg by mouth daily.       Allergies:   Lovastatin and Rosuvastatin   Past Medical History:  Diagnosis Date   Ambulates with cane    due to gout    Anemia    Arthritis    hands   Arthritis    Cancer, uterine    Chest pain, unspecified 07-01-2018  per pt had 2 day chest pain and sob  6 weeks ago,, per pt no symptoms since   cardiology-- dr revanker--  scheduled for stress test and echo 07-03-2018 prior to surgery scheduled 07-08-2018   Diarrhea, unspecified    intermittant   DOE (dyspnea on exertion)    Endometrial cancer    Essential hypertension    Frequency of urination    GERD (gastroesophageal reflux disease)    Gout rheumologist-- michelle young PA ([facility])   07-01-2018 per pt flare up april 2019 , hands and wrists   Heart murmur    History of adenomatous polyp of colon    History of esophageal dilatation    for stricture   Mild nausea    per pt intermittant , no vomiting   Mixed dyslipidemia    Muscle  weakness of extremity    bilateral upper and lower extremities due to gout   Poor appetite    per pt    Type 2 diabetes mellitus    followed by pcp   Urgency of urination    Urine frequency     Past Surgical History:  Procedure Laterality Date   BREAST CYST EXCISION Right 1974   benign per pt   BRONCHIAL WASHINGS  06/27/2021   Procedure: BRONCHIAL WASHINGS;  Surgeon: Ramaswamy, Murali, MD;  Location: [facility];  Service: Endoscopy;;   CATARACT EXTRACTION W/ INTRAOCULAR LENS  IMPLANT, BILATERAL  2014;  2017   MOUTH SURGERY  11/2017   Upper quadrants,  per pt peridontist did laser procedure on gums   ROBOTIC ASSISTED TOTAL HYSTERECTOMY WITH BILATERAL SALPINGO OOPHERECTOMY Bilateral 07/22/2018   Procedure: XI ROBOTIC ASSISTED TOTAL HYSTERECTOMY WITH BILATERAL SALPINGO OOPHORECTOMY AND SENTINAL LYMPH NODE BIOPSY;  Surgeon: Rossi, Emma, MD;  Location: [facility];  Service: Gynecology;  Laterality: Bilateral;   TONSILLECTOMY  child   VIDEO BRONCHOSCOPY N/A 06/27/2021   Procedure: VIDEO BRONCHOSCOPY WITHOUT FLUORO;  Surgeon: Ramaswamy, Murali, MD;  Location: [facility];  Service: Endoscopy;  Laterality: N/A;     Social History:  The patient  reports that she has never smoked. She has never used smokeless tobacco. She reports that she does not currently use alcohol. She reports that she does not use drugs.   Family History:  The patient's family history includes Clotting disorder in her father; Diabetes in her maternal grandfather; Heart disease in her father and mother; Heart failure in her mother; Prostate cancer in her father.    ROS:  Please see the history of present illness. All other systems are reviewed and  Negative to the above problem except as noted.    PHYSICAL EXAM: VS:  BP 116/72   Pulse 98   Ht 4' 10" (1.473 m)   Wt 149 lb 9.6 oz (67.9 kg)   SpO2 94% Comment: 3-4 L of oxygen  BMI 31.27 kg/m    GEN: Morbdily obese 77 yo in no acute distress  HEENT: normal  Neck: JVP  is normal     Cardiac: RRR; no murmur   No  LE edema  Respiratory:  Mild dry rales at bases GI: soft, nontender, nondistended,No hepatomegaly  EKG:  EKG shows NSR  Nonspecific ST     T wav enversion in III. AVF  Echo   March 2024   1. Left ventricular ejection fraction, by estimation, is 60 to 65%. The  left ventricle has normal function. The left ventricle has no regional  wall motion abnormalities. There is moderate concentric left ventricular  hypertrophy. Left ventricular  diastolic parameters are consistent with Grade I diastolic dysfunction  (impaired relaxation). There is the interventricular septum is flattened  in systole and diastole, consistent with right ventricular pressure and  volume overload. The average left  ventricular global longitudinal strain is -15.4 %. The global longitudinal  strain is normal.   2. Right ventricular systolic function is normal. The right ventricular  size is moderately enlarged. There is mildly elevated pulmonary artery  systolic pressure.   3. The mitral valve is normal in structure. No evidence of mitral valve  regurgitation. No evidence of mitral stenosis.   4. The aortic valve is tricuspid. Aortic valve regurgitation is trivial.  No aortic stenosis is present.   5. Pulmonic valve regurgitation is moderate.   6. The inferior vena cava is normal in size with greater than 50%  respiratory variability, suggesting right atrial pressure of 3 mmHg.   Lipid Panel    Component Value Date/Time   CHOL 111 08/15/2022 1636   CHOL 125 04/18/2020 0735   TRIG 94 08/15/2022 1636   HDL 61 08/15/2022 1636   HDL 46 04/18/2020 0735   CHOLHDL 1.8 08/15/2022 1636   VLDL 19 08/15/2022 1636   LDLCALC 31 08/15/2022 1636   LDLCALC 48 04/18/2020 0735   LDLDIRECT 56.6 06/29/2010 0948      Wt Readings from Last 3 Encounters:  01/29/23 149 lb 9.6 oz (67.9 kg)  01/25/23 150 lb (68 kg)  01/23/23 149 lb (67.6 kg)      ASSESSMENT AND PLAN:  1 R/O  pulmonary HTN   Recent echo RV enlarged  Est PAP 50   Agree with thoughts for R heart cath, confirm pulmonary pressures   Risks/benefits described   Patient understands and agrees to proceed  2  HTN  BP is  well controlled.   Continue current meds  3  CAD The pt had CT in July 2022   Severe coronary calcifications noted  No symptoms of angina     4   HL   LDL 31  HDL 61   Keep on Crestor 3 to 4 x per week     Recheck lipids  Labs  CBC, BMET and lipids       Current medicines are reviewed at length with the patient today.  The patient does not have concerns regarding medicines.  Signed, Cherysh Epperly, MD  01/29/2023 9:51 PM    [facility] [address], [place], [place]  [address] Phone: [phone]; Fax: [phone]  

## 2023-01-29 NOTE — Telephone Encounter (Signed)
Ofev sample has been placed in bin under front desk. Please provide to pt/her husband when they stop by clinic  Chesley Mires, PharmD, MPH, BCPS, CPP Clinical Pharmacist (Rheumatology and Pulmonology)

## 2023-01-29 NOTE — Progress Notes (Signed)
Cardiology Office Note   Date:  01/29/2023   ID:  Madison StalkerSarah L Holden, DOB 1946-04-18, MRN [id number]  PCP:  Henrine Screwshacker, Robert, MD  Cardiologist:   Dietrich PatesPaula Amaal Dimartino, MD    F/U of HTN     History of Present Illness: Madison Holden is a 77 y.o. female with a history of HTN and DM    Pt also with mild plaquing on chest CT  Myovue in Sept 2019 was normal  Echo showed normal LVEF with mod LVH    The pt had a CT of chest done in July  2022   This showed signficant calcifications of coronary arteries    I saw the pt in June 2023   She was seen by PharmD in A Aug 2023   REcomm Crestor 5 mg MWF to start.   Nexzilet not affordible  DId not want to start Repatha (injection)  The pt is followed in pulmonary by Lavinia SharpsM Ramaswamy   Worsening pulmonary fibrosis.     Had an echo done  RV enlarged   Est PAP 50 mm   Pulm would like R heart cath to confirm pressures, r/o siginficant   Current Meds  Medication Sig   acetaminophen (TYLENOL) 650 MG CR tablet Take 650 mg by mouth every 8 (eight) hours as needed for pain.   allopurinol (ZYLOPRIM) 300 MG tablet Take 300 mg by mouth daily.   amLODipine (NORVASC) 2.5 MG tablet TAKE 1 TABLET BY MOUTH ONCE DAILY   ASA-APAP-Caff Buffered (VANQUISH PO) Take 2 tablets by mouth 2 (two) times a week.   B-D TB SYRINGE 1CC/27GX1/2" 27G X 1/2" 1 ML MISC USE WEEKLY WITH METHOTREXATE SUBCUTANEOUSLY 90 DAYS   Baclofen 5 MG TABS Take 0.5-1 tablets by mouth at bedtime as needed.   diphenhydrAMINE (BENADRYL) 25 MG tablet Take 25 mg by mouth at bedtime as needed for sleep.    esomeprazole (NEXIUM) 40 MG capsule Take 1 capsule (40 mg total) by mouth daily before breakfast.   etanercept (ENBREL) 50 MG/ML injection Inject 25 mg into the skin 2 (two) times a week.   famotidine (PEPCID) 40 MG tablet Take 1 tablet (40 mg total) by mouth at bedtime.   Ferrous Gluconate 324 (37.5 Fe) MG TABS Take 1 tablet by mouth 2 (two) times daily.   folic acid (FOLVITE) 800 MCG tablet Take 800 mcg by mouth  daily.   hydrochlorothiazide (HYDRODIURIL) 25 MG tablet Take 12.5 mg by mouth daily.   ibuprofen (ADVIL) 200 MG tablet    ketoconazole (NIZORAL) 2 % cream SMARTSIG:sparingly Topical Twice Daily   Magnesium 500 MG TABS Take 500 mg by mouth daily.   Melatonin 3 MG TABS Take 3-6 mg by mouth at bedtime as needed (sleep).    methotrexate 250 MG/10ML injection once a week. 0.5 ml   oxybutynin (DITROPAN) 5 MG tablet Take 5 mg by mouth 2 (two) times daily.   pioglitazone (ACTOS) 30 MG tablet Take 30 mg by mouth daily.    rosuvastatin (CRESTOR) 5 MG tablet Take 1 tablet (5 mg total) by mouth daily. Start with one tablet every Monday, Wednesday, and Friday. Increase to one tablet daily if tolerating well. (Patient taking differently: Take 5 mg by mouth 3 (three) times a week. Start with one tablet every Monday, Wednesday, and Friday.)   vitamin B-12 (CYANOCOBALAMIN) 1000 MCG tablet Take 1,000 mcg by mouth daily.   vitamin C (ASCORBIC ACID) 500 MG tablet Take 500 mg by mouth daily.  Allergies:   Lovastatin and Rosuvastatin   Past Medical History:  Diagnosis Date   Ambulates with cane    due to gout    Anemia    Arthritis    hands   Arthritis    Cancer, uterine    Chest pain, unspecified 07-01-2018  per pt had 2 day chest pain and sob  6 weeks ago,, per pt no symptoms since   cardiology-- dr Consuello Bossier--  scheduled for stress test and echo 07-03-2018 prior to surgery scheduled 07-08-2018   Diarrhea, unspecified    intermittant   DOE (dyspnea on exertion)    Endometrial cancer    Essential hypertension    Frequency of urination    GERD (gastroesophageal reflux disease)    Gout rheumologist-- michelle young PA ([facility])   07-01-2018 per pt flare up april 2019 , hands and wrists   Heart murmur    History of adenomatous polyp of colon    History of esophageal dilatation    for stricture   Mild nausea    per pt intermittant , no vomiting   Mixed dyslipidemia    Muscle  weakness of extremity    bilateral upper and lower extremities due to gout   Poor appetite    per pt    Type 2 diabetes mellitus    followed by pcp   Urgency of urination    Urine frequency     Past Surgical History:  Procedure Laterality Date   BREAST CYST EXCISION Right 1974   benign per pt   BRONCHIAL WASHINGS  06/27/2021   Procedure: BRONCHIAL WASHINGS;  Surgeon: Kalman Shan, MD;  Location: [facility];  Service: Endoscopy;;   CATARACT EXTRACTION W/ INTRAOCULAR LENS  IMPLANT, BILATERAL  2014;  2017   MOUTH SURGERY  11/2017   Upper quadrants,  per pt peridontist did laser procedure on gums   ROBOTIC ASSISTED TOTAL HYSTERECTOMY WITH BILATERAL SALPINGO OOPHERECTOMY Bilateral 07/22/2018   Procedure: XI ROBOTIC ASSISTED TOTAL HYSTERECTOMY WITH BILATERAL SALPINGO OOPHORECTOMY AND SENTINAL LYMPH NODE BIOPSY;  Surgeon: Adolphus Birchwood, MD;  Location: [facility];  Service: Gynecology;  Laterality: Bilateral;   TONSILLECTOMY  child   VIDEO BRONCHOSCOPY N/A 06/27/2021   Procedure: VIDEO BRONCHOSCOPY WITHOUT FLUORO;  Surgeon: Kalman Shan, MD;  [facility]: [facility];  Service: Endoscopy;  Laterality: N/A;     Social History:  The patient  reports that she has never smoked. She has never used smokeless tobacco. She reports that she does not currently use alcohol. She reports that she does not use drugs.   Family History:  The patient's family history includes Clotting disorder in her father; Diabetes in her maternal grandfather; Heart disease in her father and mother; Heart failure in her mother; Prostate cancer in her father.    ROS:  Please see the history of present illness. All other systems are reviewed and  Negative to the above problem except as noted.    PHYSICAL EXAM: VS:  BP 116/72   Pulse 98   Ht  (1.473 m)   Wt 149 lb 9.6 oz (67.9 kg)   SpO2 94% Comment: 3-4 L of oxygen  BMI 31.27 kg/m    GEN: Morbdily obese 77 yo in no acute distress  HEENT: normal  Neck: JVP  is normal     Cardiac: RRR; no murmur   No  LE edema  Respiratory:  Mild dry rales at bases GI: soft, nontender, nondistended,No hepatomegaly  EKG:  EKG shows NSR  Nonspecific ST  T wav enversion in III. AVF  Echo   March 2024   1. Left ventricular ejection fraction, by estimation, is 60 to 65%. The  left ventricle has normal function. The left ventricle has no regional  wall motion abnormalities. There is moderate concentric left ventricular  hypertrophy. Left ventricular  diastolic parameters are consistent with Grade I diastolic dysfunction  (impaired relaxation). There is the interventricular septum is flattened  in systole and diastole, consistent with right ventricular pressure and  volume overload. The average left  ventricular global longitudinal strain is -15.4 %. The global longitudinal  strain is normal.   2. Right ventricular systolic function is normal. The right ventricular  size is moderately enlarged. There is mildly elevated pulmonary artery  systolic pressure.   3. The mitral valve is normal in structure. No evidence of mitral valve  regurgitation. No evidence of mitral stenosis.   4. The aortic valve is tricuspid. Aortic valve regurgitation is trivial.  No aortic stenosis is present.   5. Pulmonic valve regurgitation is moderate.   6. The inferior vena cava is normal in size with greater than 50%  respiratory variability, suggesting right atrial pressure of 3 mmHg.   Lipid Panel    Component Value Date/Time   CHOL 111 08/15/2022 1636   CHOL 125 04/18/2020 0735   TRIG 94 08/15/2022 1636   HDL 61 08/15/2022 1636   HDL 46 04/18/2020 0735   CHOLHDL 1.8 08/15/2022 1636   VLDL 19 08/15/2022 1636   LDLCALC 31 08/15/2022 1636   LDLCALC 48 04/18/2020 0735   LDLDIRECT 56.6 06/29/2010 0948      Wt Readings from Last 3 Encounters:  01/29/23 149 lb 9.6 oz (67.9 kg)  01/25/23 150 lb (68 kg)  01/23/23 149 lb (67.6 kg)      ASSESSMENT AND PLAN:  1 R/O  pulmonary HTN   Recent echo RV enlarged  Est PAP 50   Agree with thoughts for R heart cath, confirm pulmonary pressures   Risks/benefits described   Patient understands and agrees to proceed  2  HTN  BP is  well controlled.   Continue current meds  3  CAD The pt had CT in July 2022   Severe coronary calcifications noted  No symptoms of angina     4   HL   LDL 31  HDL 61   Keep on Crestor 3 to 4 x per week     Recheck lipids  Labs  CBC, BMET and lipids       Current medicines are reviewed at length with the patient today.  The patient does not have concerns regarding medicines.  Signed, Dietrich Pates, MD  01/29/2023 9:51 PM    [facility] [address] [place], [place], [place]  [address] Phone: [phone]; Fax: [phone]

## 2023-01-31 ENCOUNTER — Other Ambulatory Visit (HOSPITAL_COMMUNITY): Payer: Medicare Other

## 2023-01-31 ENCOUNTER — Ambulatory Visit
Admission: RE | Admit: 2023-01-31 | Discharge: 2023-01-31 | Disposition: A | Discharge status: Home / Self Care | Payer: Medicare Other | Source: Ambulatory Visit | Attending: Adult Health | Admitting: Adult Health

## 2023-01-31 DIAGNOSIS — J84112 Idiopathic pulmonary fibrosis: Secondary | ICD-10-CM | POA: Diagnosis not present

## 2023-01-31 DIAGNOSIS — J849 Interstitial pulmonary disease, unspecified: Secondary | ICD-10-CM

## 2023-01-31 DIAGNOSIS — K449 Diaphragmatic hernia without obstruction or gangrene: Secondary | ICD-10-CM | POA: Diagnosis not present

## 2023-01-31 DIAGNOSIS — J479 Bronchiectasis, uncomplicated: Secondary | ICD-10-CM | POA: Diagnosis not present

## 2023-01-31 DIAGNOSIS — I700 Atherosclerosis of aorta: Secondary | ICD-10-CM | POA: Diagnosis not present

## 2023-02-05 ENCOUNTER — Telehealth: Payer: Self-pay | Admitting: Internal Medicine

## 2023-02-05 NOTE — Telephone Encounter (Signed)
Called pt. To take her off your May 9th day and she needed to have pft same day but here a [facility] is booked so made May 2nd apt to see you with out PFT B&A due to sched. Issues

## 2023-02-05 NOTE — Telephone Encounter (Signed)
Patient picked up Ofev sample from up front. Will await f/u from pt and/or her husband regarding sending rx to pharmacy based on her tolerability  Chesley Mires, PharmD, MPH, BCPS, CPP Clinical Pharmacist (Rheumatology and Pulmonology)

## 2023-02-05 NOTE — Telephone Encounter (Signed)
Dr. Marchelle Gearing I tried that and she didn't want to come 2 separate times out from [facility] and I tried very hard to make this work

## 2023-02-05 NOTE — Telephone Encounter (Signed)
That is fine. LEt us keep PFT 5/9?24 and if needed can cancel it

## 2023-02-06 DIAGNOSIS — N179 Acute kidney failure, unspecified: Secondary | ICD-10-CM | POA: Diagnosis not present

## 2023-02-06 DIAGNOSIS — J8417 Interstitial lung disease with progressive fibrotic phenotype in diseases classified elsewhere: Secondary | ICD-10-CM | POA: Diagnosis not present

## 2023-02-06 DIAGNOSIS — R0609 Other forms of dyspnea: Secondary | ICD-10-CM | POA: Diagnosis not present

## 2023-02-06 DIAGNOSIS — C541 Malignant neoplasm of endometrium: Secondary | ICD-10-CM | POA: Diagnosis not present

## 2023-02-07 NOTE — Progress Notes (Signed)
Called and spoke with patient, advised of results/recommendations per Rubye Oaks NP.  She verbalized understanding.  She sees MR on 5/2 and I scheduled her 30 min PFT on 5/7 at [facility].  Mailed her appointment reminder with do's and don'ts listed as well as address.  Nothing further needed.

## 2023-02-07 NOTE — Progress Notes (Signed)
CT scan faxed to Dr. Henrine Screws per patient request.

## 2023-02-11 ENCOUNTER — Other Ambulatory Visit: Payer: Self-pay | Admitting: Physician Assistant

## 2023-02-11 ENCOUNTER — Ambulatory Visit
Admission: RE | Admit: 2023-02-11 | Discharge: 2023-02-11 | Disposition: A | Discharge status: Home / Self Care | Payer: Medicare Other | Source: Ambulatory Visit | Attending: Physician Assistant | Admitting: Physician Assistant

## 2023-02-11 DIAGNOSIS — K449 Diaphragmatic hernia without obstruction or gangrene: Secondary | ICD-10-CM | POA: Diagnosis not present

## 2023-02-11 DIAGNOSIS — R1314 Dysphagia, pharyngoesophageal phase: Secondary | ICD-10-CM | POA: Diagnosis not present

## 2023-02-11 DIAGNOSIS — Z01818 Encounter for other preprocedural examination: Secondary | ICD-10-CM | POA: Insufficient documentation

## 2023-02-11 DIAGNOSIS — M359 Systemic involvement of connective tissue, unspecified: Secondary | ICD-10-CM

## 2023-02-11 DIAGNOSIS — I2510 Atherosclerotic heart disease of native coronary artery without angina pectoris: Secondary | ICD-10-CM | POA: Diagnosis not present

## 2023-02-11 DIAGNOSIS — Z79899 Other long term (current) drug therapy: Secondary | ICD-10-CM | POA: Diagnosis not present

## 2023-02-11 DIAGNOSIS — J9611 Chronic respiratory failure with hypoxia: Secondary | ICD-10-CM

## 2023-02-11 DIAGNOSIS — J849 Interstitial pulmonary disease, unspecified: Secondary | ICD-10-CM

## 2023-02-11 DIAGNOSIS — K224 Dyskinesia of esophagus: Secondary | ICD-10-CM | POA: Diagnosis not present

## 2023-02-12 ENCOUNTER — Ambulatory Visit: Payer: Medicare Other

## 2023-02-12 DIAGNOSIS — I2510 Atherosclerotic heart disease of native coronary artery without angina pectoris: Secondary | ICD-10-CM | POA: Diagnosis not present

## 2023-02-12 DIAGNOSIS — Z79899 Other long term (current) drug therapy: Secondary | ICD-10-CM

## 2023-02-12 DIAGNOSIS — Z01818 Encounter for other preprocedural examination: Secondary | ICD-10-CM

## 2023-02-13 ENCOUNTER — Telehealth: Payer: Self-pay | Admitting: *Deleted

## 2023-02-13 LAB — BASIC METABOLIC PANEL
BUN/Creatinine Ratio: 21 (ref 12–28)
BUN: 18 mg/dL (ref 8–27)
CO2: 20 mmol/L (ref 20–29)
Calcium: 9.4 mg/dL (ref 8.7–10.3)
Chloride: 95 mmol/L — ABNORMAL LOW (ref 96–106)
Creatinine, Ser: 0.85 mg/dL (ref 0.57–1.00)
Glucose: 102 mg/dL — ABNORMAL HIGH (ref 70–99)
Potassium: 4.4 mmol/L (ref 3.5–5.2)
Sodium: 132 mmol/L — ABNORMAL LOW (ref 134–144)
eGFR: 72 mL/min/{1.73_m2} (ref >59)

## 2023-02-13 LAB — CBC
Hematocrit: 34.8 % (ref 34.0–46.6)
Hemoglobin: 11.6 g/dL (ref 11.1–15.9)
MCH: 33.8 pg — ABNORMAL HIGH (ref 26.6–33.0)
MCHC: 33.3 g/dL (ref 31.5–35.7)
MCV: 102 fL — ABNORMAL HIGH (ref 79–97)
Platelets: 210 10*3/uL (ref 150–450)
RBC: 3.43 x10E6/uL — ABNORMAL LOW (ref 3.77–5.28)
RDW: 15 % (ref 11.7–15.4)
WBC: 8.2 10*3/uL (ref 3.4–10.8)

## 2023-02-13 LAB — LIPID PANEL
Chol/HDL Ratio: 1.8 ratio (ref 0.0–4.4)
Cholesterol, Total: 109 mg/dL (ref 100–199)
HDL: 62 mg/dL (ref >39)
LDL Chol Calc (NIH): 28 mg/dL (ref 0–99)
Triglycerides: 104 mg/dL (ref 0–149)
VLDL Cholesterol Cal: 19 mg/dL (ref 5–40)

## 2023-02-13 NOTE — Telephone Encounter (Signed)
Right Heart Cath scheduled at [facility] for: Thursday February 14, 2023 9 AM Arrival time [facility] Main Entrance A at 7 AM  Nothing to eat after midnight prior to procedure, clear liquids until 5 AM day of procedure.  Medication instructions -Hold:  HCTZ/Actos-AM of procedure  -Other usual morning medications can be taken with sips of water.  Confirmed patient has responsible adult to drive home post procedure and be with patient first 24 hours after arriving home.  Plan to go home the same day, you will only stay overnight if medically necessary.  Reviewed procedure instructions with patient.

## 2023-02-14 ENCOUNTER — Ambulatory Visit (HOSPITAL_COMMUNITY)
Admission: RE | Admit: 2023-02-14 | Discharge: 2023-02-14 | Disposition: A | Discharge status: Home / Self Care | Payer: Medicare Other | Attending: Internal Medicine | Admitting: Internal Medicine

## 2023-02-14 ENCOUNTER — Telehealth: Payer: Self-pay | Admitting: Internal Medicine

## 2023-02-14 ENCOUNTER — Other Ambulatory Visit: Payer: Self-pay

## 2023-02-14 ENCOUNTER — Encounter (HOSPITAL_COMMUNITY): Payer: Self-pay | Admitting: Internal Medicine

## 2023-02-14 ENCOUNTER — Ambulatory Visit (HOSPITAL_COMMUNITY)
Admission: RE | Disposition: A | Discharge status: Home / Self Care | Payer: Self-pay | Source: Home / Self Care | Attending: Internal Medicine

## 2023-02-14 DIAGNOSIS — J8410 Pulmonary fibrosis, unspecified: Secondary | ICD-10-CM | POA: Diagnosis not present

## 2023-02-14 DIAGNOSIS — I2584 Coronary atherosclerosis due to calcified coronary lesion: Secondary | ICD-10-CM | POA: Diagnosis not present

## 2023-02-14 DIAGNOSIS — Z7984 Long term (current) use of oral hypoglycemic drugs: Secondary | ICD-10-CM | POA: Insufficient documentation

## 2023-02-14 DIAGNOSIS — Z79899 Other long term (current) drug therapy: Secondary | ICD-10-CM | POA: Insufficient documentation

## 2023-02-14 DIAGNOSIS — E785 Hyperlipidemia, unspecified: Secondary | ICD-10-CM | POA: Diagnosis not present

## 2023-02-14 DIAGNOSIS — I10 Essential (primary) hypertension: Secondary | ICD-10-CM | POA: Diagnosis not present

## 2023-02-14 DIAGNOSIS — I2510 Atherosclerotic heart disease of native coronary artery without angina pectoris: Secondary | ICD-10-CM | POA: Diagnosis not present

## 2023-02-14 DIAGNOSIS — Z9981 Dependence on supplemental oxygen: Secondary | ICD-10-CM | POA: Insufficient documentation

## 2023-02-14 DIAGNOSIS — J849 Interstitial pulmonary disease, unspecified: Secondary | ICD-10-CM | POA: Diagnosis not present

## 2023-02-14 DIAGNOSIS — R0609 Other forms of dyspnea: Secondary | ICD-10-CM | POA: Diagnosis not present

## 2023-02-14 DIAGNOSIS — I272 Pulmonary hypertension, unspecified: Secondary | ICD-10-CM | POA: Diagnosis not present

## 2023-02-14 DIAGNOSIS — E119 Type 2 diabetes mellitus without complications: Secondary | ICD-10-CM | POA: Diagnosis not present

## 2023-02-14 HISTORY — PX: RIGHT HEART CATH: CATH118263

## 2023-02-14 LAB — POCT I-STAT EG7
Acid-Base Excess: 0 mmol/L (ref 0.0–2.0)
Bicarbonate: 25.7 mmol/L (ref 20.0–28.0)
Calcium, Ion: 1.23 mmol/L (ref 1.15–1.40)
HCT: 32 % — ABNORMAL LOW (ref 36.0–46.0)
Hemoglobin: 10.9 g/dL — ABNORMAL LOW (ref 12.0–15.0)
O2 Saturation: 60 %
Potassium: 3.5 mmol/L (ref 3.5–5.1)
Sodium: 131 mmol/L — ABNORMAL LOW (ref 135–145)
TCO2: 27 mmol/L (ref 22–32)
pCO2, Ven: 47.6 mmHg (ref 44–60)
pH, Ven: 7.341 (ref 7.25–7.43)
pO2, Ven: 34 mmHg (ref 32–45)

## 2023-02-14 SURGERY — RIGHT HEART CATH

## 2023-02-14 MED ORDER — SODIUM CHLORIDE 0.9% FLUSH: 3.0000 mL | INTRAVENOUS | Status: DC | PRN

## 2023-02-14 MED ORDER — ONDANSETRON HCL 4 MG/2ML IJ SOLN: 4.0000 mg | Freq: Four times a day (QID) | INTRAMUSCULAR | Status: DC | PRN

## 2023-02-14 MED ORDER — SODIUM CHLORIDE 0.9% FLUSH: 3.0000 mL | Freq: Two times a day (BID) | INTRAVENOUS | Status: DC

## 2023-02-14 MED ORDER — LIDOCAINE HCL (PF) 1 % IJ SOLN
INTRAMUSCULAR | Status: AC
  Filled 2023-02-14: qty 30

## 2023-02-14 MED ORDER — SODIUM CHLORIDE 0.9 % IV SOLN: 250.0000 mL | INTRAVENOUS | Status: DC | PRN

## 2023-02-14 MED ORDER — FENTANYL CITRATE (PF) 100 MCG/2ML IJ SOLN
INTRAMUSCULAR | Status: AC
  Filled 2023-02-14: qty 2

## 2023-02-14 MED ORDER — HYDRALAZINE HCL 20 MG/ML IJ SOLN: 10.0000 mg | INTRAMUSCULAR | Status: DC | PRN

## 2023-02-14 MED ORDER — SODIUM CHLORIDE 0.9 % WEIGHT BASED INFUSION: 1.0000 mL/kg/h | INTRAVENOUS | Status: DC

## 2023-02-14 MED ORDER — ADENOSINE (DIAGNOSTIC) 3 MG/ML IV SOLN
0.0500 mg/kg/min | Freq: Once | INTRAVENOUS | Status: AC
  Administered 2023-02-14: 3.38 mg/min via INTRAVENOUS
  Filled 2023-02-14 (×2): qty 120

## 2023-02-14 MED ORDER — ADENOSINE (PAH - CATH LAB) SYRINGE(S)
120.0000 mL | Freq: Once | INTRAVENOUS | Status: DC
  Filled 2023-02-14 (×2): qty 120

## 2023-02-14 MED ORDER — SODIUM CHLORIDE 0.9 % WEIGHT BASED INFUSION: 3.0000 mL/kg/h | INTRAVENOUS | Status: DC

## 2023-02-14 MED ORDER — SODIUM CHLORIDE 0.9 % IV SOLN: INTRAVENOUS | Status: DC

## 2023-02-14 MED ORDER — FENTANYL CITRATE (PF) 100 MCG/2ML IJ SOLN
INTRAMUSCULAR | Status: DC | PRN
  Administered 2023-02-14 (×2): 25 ug via INTRAVENOUS

## 2023-02-14 MED ORDER — HEPARIN (PORCINE) IN NACL 1000-0.9 UT/500ML-% IV SOLN
INTRAVENOUS | Status: DC | PRN
  Administered 2023-02-14: 500 mL

## 2023-02-14 MED ORDER — LABETALOL HCL 5 MG/ML IV SOLN: 10.0000 mg | INTRAVENOUS | Status: DC | PRN

## 2023-02-14 MED ORDER — ACETAMINOPHEN 325 MG PO TABS: 650.0000 mg | ORAL_TABLET | ORAL | Status: DC | PRN

## 2023-02-14 MED ORDER — MIDAZOLAM HCL 2 MG/2ML IJ SOLN
INTRAMUSCULAR | Status: AC
  Filled 2023-02-14: qty 2

## 2023-02-14 MED ORDER — MIDAZOLAM HCL 2 MG/2ML IJ SOLN
INTRAMUSCULAR | Status: DC | PRN
  Administered 2023-02-14 (×2): 1 mg via INTRAVENOUS

## 2023-02-14 SURGICAL SUPPLY — 4 items
CATH SWAN GANZ 7F STRAIGHT (CATHETERS) IMPLANT
PACK CARDIAC CATHETERIZATION (CUSTOM PROCEDURE TRAY) IMPLANT
SHEATH PINNACLE 7F 10CM (SHEATH) IMPLANT
SHEATH PROBE COVER 6X72 (BAG) IMPLANT

## 2023-02-14 NOTE — Telephone Encounter (Signed)
Return call to patient to review post cath site care instructions. Patient verbalized understanding and had no questions.

## 2023-02-14 NOTE — Discharge Instructions (Signed)
Drink plenty of fluids for 48 hours and keep wrist elevated at heart level for 24 hours  Jugular Site Care   This sheet gives you information about how to care for yourself after your procedure. Your health care provider may also give you more specific instructions. If you have problems or questions, contact your health care provider. What can I expect after the procedure? After the procedure, it is common to have: Bruising and tenderness at the catheter insertion area. Follow these instructions at home: Medicines Take over-the-counter and prescription medicines only as told by your health care provider. Insertion site care Follow instructions from your health care provider about how to take care of your insertion site. Make sure you: Wash your hands with soap and water before you change your bandage (dressing). If soap and water are not available, use hand sanitizer. Remove your dressing tomorrow at 11:00 am Check your insertion site every day for signs of infection. Check for: Redness, swelling, or pain. Fluid or blood. Pus or a bad smell. Warmth. Do not take baths, swim, or use a hot tub until your health care provider approves. You may shower 24 hours after the procedure,  Remove the dressing  Pat the area dry with a clean towel. Do not rub the site. That could cause bleeding. Do not apply powder or lotion to the site. Activity   For 24 hours after the procedure, or as directed by your health care provider: Do not domove your neck a lot. Do not push or pull heavy objects. Do not drive yourself home from the hospital or clinic. You may drive 24 hours after the procedure unless your health care provider tells you not to. Do not operate machinery or power tools. Do not lift anything that is heavier than 10 lb (4.5 kg), or the limit that you are told, until your health care provider says that it is safe.  For 4 days Ask your health care provider when it is okay to: Return to work  or school. Resume usual physical activities or sports. Resume sexual activity. General instructions If the catheter site starts to bleed, put firm pressure on the site. If the bleeding does not stop, get help right away. This is a medical emergency. If you went home on the same day as your procedure, a responsible adult should be with you for the first 24 hours after you arrive home. Keep all follow-up visits as told by your health care provider. This is important. Contact a health care provider if: You have a fever. You have redness, swelling, or yellow drainage around your insertion site. Get help right away if: You have unusual pain at the radial site. The catheter insertion area swells very fast. The insertion area is bleeding, and the bleeding does not stop when you hold steady pressure on the area. Your arm or hand becomes pale, cool, tingly, or numb. These symptoms may represent a serious problem that is an emergency. Do not wait to see if the symptoms will go away. Get medical help right away. Call your local emergency services (911 in the [place] Do not drive yourself to the hospital. Summary After the procedure, it is common to have bruising and tenderness at the site. Follow instructions from your health care provider about how to take care of your puncture site wound. Check the wound every day for signs of infection. Do not lift anything that is heavier than 10 lb (4.5 kg), or the limit that you are told,  until your health care provider says that it is safe. This information is not intended to replace advice given to you by your health care provider. Make sure you discuss any questions you have with your health care provider. Document Revised: 11/13/2017 Document Reviewed: 11/13/2017 Elsevier Patient Education  2020 [employer].

## 2023-02-14 NOTE — Interval H&P Note (Signed)
History and Physical Interval Note:  02/14/2023 7:06 AM  Madison Holden  has presented today for surgery, with the diagnosis of shortness of breath.  The various methods of treatment have been discussed with the patient and family. After consideration of risks, benefits and other options for treatment, the patient has consented to  Procedure(s): RIGHT HEART CATH (N/A) as a surgical intervention.  The patient's history has been reviewed, patient examined, no change in status, stable for surgery.  I have reviewed the patient's chart and labs.  Questions were answered to the patient's satisfaction.     Orbie Pyo

## 2023-02-14 NOTE — Telephone Encounter (Signed)
Patient wants to know when to remove the bandage from her procedure today.  Patient states can also leave message on her VM.

## 2023-02-15 MED FILL — Lidocaine HCl Local Preservative Free (PF) Inj 1%: INTRAMUSCULAR | Qty: 30 | Status: AC

## 2023-02-20 NOTE — Patient Instructions (Signed)
Interstitial lung disease due to connective tissue disease (HCC) Chronic respiratory failure with hypoxia (HCC) Immunosuppression due to drug therapy (HCC) Encounter for medication monitoring   -  you have had progressive decline in lung function and o2 need -This is due to progressive pulmonary fibrosis and also very likely due to development of pulmonary hypertension -Currently using 3 L oxygen 24/7 using a tank system -So far tolerating week 1 of low-dose nintedanib protocol at 100 mg once daily this week  Plan --Continue Enbrel and methotrexate through [facility] Continue nintedanib 100 mg once daily this week in the next week go up to 100 mg twice daily  -Liver function test in 1 month  WHO group 3 pulmonary hypertension  Plan  - Glad you are going to see Dr. Marca Ancona heart failure specialist 03/11/2023    Follow-up - 4-6 weeks with nurse practitioner for 30-minute visit or video visit following liver function test.

## 2023-02-20 NOTE — Progress Notes (Unsigned)
IOV 05/18/21 - Dr Valeta Harms  Synopsis: Referred in July 2022 for cough and abnormal CT PCP: by Aura Dials, MD  Subjective:   PATIENT ID: Madison Holden GENDER: female DOB: 07-Jun-1946, MRN: [id number]  Chief Complaint  Patient presents with   Consult    Consult for chronic cough.     77 yo, HTN, arthritis, gout. C/o cough. Has been going on for several months. She went on a riverboat cruise this past year. Her cough is dry and non-productive. Cough is usually late at night or early in the morning.  From a respiratory standpoint is short of breath with exertion.  She has a dry nonproductive cough.  She has rheumatoid arthritis currently on Enbrel plus methotrexate.  She recently had a CT scan of the chest which revealed subpleural lower lobe reticulation and evidence of fibrosis.  She was sent here for evaluation of chronic cough and abnormal CT.    OV 05/31/2021 -transferred to Dr. Chase Caller in the ILD center  Subjective:  Patient ID: Madison Holden, female , DOB: Sep 15, 1946 , age 77 y.o. , MRN: [id number] , ADDRESS: [address] [place] [id number] PCP Aura Dials, MD Patient Care Team: Aura Dials, MD as PCP - General (Family Medicine) Fay Records, MD as PCP - Cardiology (Cardiology) Orpah Melter, MD (Family Medicine)  This Provider for this visit: Treatment Team:  Attending Provider: Brand Males, MD    05/31/2021 -   Chief Complaint  Patient presents with   Follow-up    Pt saw Dr. Valeta Harms and is now being switched to Dr. Chase Caller. States she has had an occ cough that is worse in the mornings when she first wakes up.     HPI Madison Holden 77 y.o. -history is provided by the patient, her husband and also review of the medical records.  She sees Leafy Kindle at [facility].  She tells me that she has had a diagnosis of gout for few years.  Then approximately in early 2020 she had a diagnosis of rheumatoid arthritis  given to her.  She does have rheumatoid arthritis deformities in her hands.  Shortly after that she was started on methotrexate which she says also for her gout.  She was also then started on Enbrel.  She believes that in 2019 September [personally visualized and confirmed) she had a CT scan of the abdomen that on the lung images she had presence of ILD.  This was followed with a CT chest with contrast that is described with reticulation but no honeycombing.  Pulmonary consultation was recommended but because of the pandemic she never could get to it.  She says at baseline she just has very minimal shortness of breath but then she is obese and sedentary and therefore she not able to experience her shortness of breath.  Then approximately February 2022 started having chronic dry cough that is persistent since then.  She has been referred here for the cough.  Yesterday on 6-minute walk test she did desaturate to 87% but she says she does not normally exert that much.  She does not believe portable oxygen will help her.  She believes her current regimen of methotrexate and Enbrel is controlling her pain quite well.   In 2019 she did have a normal myocardial perfusion stress test. Pulmonary function test yesterday shows restriction with low diffusion. 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  Mostly sedentary. Uses walker    CT chest 04/25/21 FINDINGS: Cardiovascular: Atherosclerotic calcification of the aorta, aortic valve and coronary arteries. Pulmonic trunk and heart are enlarged. No pericardial effusion.   Mediastinum/Nodes: Subcentimeter low-attenuation lesion in the right thyroid. No follow-up recommended. (Ref: J Am Coll Radiol. 2015 Feb;12(2): 143-50).Mediastinal lymph nodes measure up to 1.3 cm in the low  right paratracheal station, unchanged. Bihilar lymph nodes measure up to 1.3 cm on the right, also unchanged. No axillary adenopathy. Esophagus is grossly unremarkable. Moderate hiatal hernia.   Lungs/Pleura: Peripheral and somewhat basilar predominant subpleural reticulation, ground-glass and traction bronchiectasis/bronchiolectasis, as on 11/20/2018. No pleural fluid. Airway is unremarkable. IMPRESSION: 1. Pulmonary parenchymal pattern of fibrosis is unchanged from 11/20/2018 and most likely due to usual interstitial pneumonitis. Future evaluation utilizing high-resolution chest CT without contrast is suggested. 2. Chronically enlarged mediastinal and hilar lymph nodes, likely related interstitial lung disease. 3. Moderate hiatal hernia. 4. Aortic atherosclerosis (ICD10-I70.0). Coronary artery calcification. 5. Enlarged pulmonic trunk, indicative of pulmonary arterial hypertension.     Electronically Signed   By: Lorin Picket M.D.   On: 04/26/2021 07:56      OV 07/19/2021  Subjective:  Patient ID: Madison Holden, female , DOB: [birth year] , age 77 y.o. , MRN: [id number] , ADDRESS: [address] [place] [phone] PCP Aura Dials, MD Patient Care Team: Aura Dials, MD as PCP - General (Family Medicine) Fay Records, MD as PCP - Cardiology (Cardiology) Orpah Melter, MD (Family Medicine)  This Provider for this visit: Treatment Team:  Attending Provider: Brand Males, MD    07/19/2021 -   Chief Complaint  Patient presents with   Follow-up    Pt is following up today after recent bronch.  Pt states she has been doing okay and denies any complaints.    HPI Madison Holden 77 y.o. -returns for follow-up to discuss bronchoalveolar lavage results and also ILD questionnaire..  ILD questionnaire information is elicited below.    [facility] Integrated Comprehensive ILD Questionnaire  Symptoms:  -Insidious onset of symptoms since it started  around the same shortness of breath for 3 years.  No episodes present with exertion relieved by rest.  Although the cough started in February 2022.  Since it started its the same.  Mostly clear sometimes has yellow in it.  Never had hemoptysis.  It does affect her voice.  She does clear her throat.  Cough is sometimes raspy.  Symptom severity score is listed below.     Past Medical History :  -Significant for rheumatoid arthritis for several years.  Diagnosed was in 2020.  Is on methotrexate and Enbrel.  She does not want to change this ideally because pain control is pretty good.  No other connective tissue disease. -She does have nocturnal desaturations diagnosed summer 2022.  She does not want to wear oxygen.  She does not want to have sleep test -She has exertional hypoxemia diagnosed, 2022.  Does not want to use oxygen. -Has type 2 diabetes under control with diet -Has hypertension -on hydrochlorothiazide and also calcium channel blocker.  Previously on ACE inhibitor -Has gout on allopurinol -Moderate hiatal hernia on CT scan -Denies PE or heart disease or stroke -Has had COVID-vaccine but has never had COVID   ROS:   -Positive for fatigue arthritis occasional dysphagia.  She has had 80  pound weight loss in 2019 and 10 months but none since then.  No acid reflux although she does have hiatal hernia  FAMILY HISTORY of LUNG DISEASE:  Denies  PERSONAL EXPOSURE HISTORY:   -Never smoked cigarettes or cigars.  Never smoked marijuana.  Never smoked cocaine.  No intravenous drug use.  HOME  EXPOSURE and HOBBY DETAILS :   [place] age of the current home is 35 years.  The bathroom did have a little mold in the ceiling but they treated it.  She had some mold in the floor but got replaced.  She does use a feather pillow for many years.  She had a sitter cleaned 20 years ago but there is no mold in it.  OCCUPATIONAL HISTORY (122 questions) :  -Other  than using for the pillow she has no organic antigen exposure history -Inorganic antigen exposure history at work is negative  PULMONARY TOXICITY HISTORY (27 items):  -Positive for methotrexate in 2020.  She took prednisone briefly between 2019 and 2020.  She is on Enbrel since 2020.  This controls the pain well. 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  Differential diagnosis for this includes medication such as methotrexate, allopurinol and hydrochlorothiazide which she is on.  She is not on any ACE inhibitor or cancer drugs or sulfa drugs or NSAIDs which are also known to cause eosinophilia.  The mixed cellularity in the absence of infection and the mild eosinophilia suggests interstitial lung disease from connective tissue disease as the cause   Results for AVIV, SALZ (MRN [id number]) as of 07/19/2021 13:23  Ref. Range 06/27/2021 13:31  Monocyte-Macrophage-Serous Fluid Latest Ref Range: 50 - 90 % 29 (L)  Other Cells, Fluid Latest Units: % CORRELATE WITH CYTOLOGY.  Color, Fluid Latest Ref Range: YELLOW  COLORLESS (A)  Total Nucleated Cell Count, Fluid Latest Ref Range: 0 - 1,000 cu mm 147  Fluid Type-FCT Unknown Bronch Lavag  Lymphs, Fluid Latest Units: % 0  Eos, Fluid Latest Units: % 11  Appearance, Fluid Latest Ref Range: CLEAR  HAZY (A)  Neutrophil Count, Fluid Latest Ref Range: 0 - 25 % 60 (H)   Overnight pulse ox study done on room air on 06/06/2021 shows pulse ox less than or equal to 88% for 1 hour and 7 minutes and 48  seconds. Start 2 L nasal cannula oxygen   IMPRESSIONS  ECHO sept 2022    1. Left ventricular ejection fraction, by estimation, is 60 to 65%. The  left ventricle has normal function. The left ventricle has no regional  wall motion abnormalities. There is moderate concentric left ventricular  hypertrophy. Left ventricular  diastolic parameters are indeterminate.   2. Right ventricular systolic function is normal. The right ventricular  size is normal.   3. The mitral valve is grossly normal. Trivial mitral valve  regurgitation.   4. The aortic valve is normal in structure. Aortic valve regurgitation is  not visualized. No aortic stenosis is present   11/02/2022 Follow up : CTD related ILD and Hypoxemia  Patient presents for a follow-up visit.  Last seen July 19, 2021.  Patient missed her recommended follow-up.  Patient says overall breathing has been gradually getting worse over the last year but definitely substantially raised shortness of breath over the last 6 weeks.  She is becoming more short of breath with activities.  Last visit patient was recommended begin oxygen with activity and at bedtime for known hypoxemia nocturnally and with activity. She declined oxygen last visit.  Today in the office on arrival O2 saturations were 74% with ambulation.  Patient required 3 L of oxygen to maintain O2 saturations greater than 88 to 90% she also has been noticing weight loss she is down 30 pounds since last visit.  She denies any hemoptysis, chest pain, orthopnea, edema. Patient has underlying rheumatoid arthritis and is on Enbrel and methotrexate  Says that over last 2 months that that is becoming harder for her to do things without becoming very short of breath.  She says she has to pant for air at time   11/12/2022 Follow up : CTD related ILD, chronic respiratory failure Patient returns for a 1 week follow-up.  Patient was seen last visit with a progressive clinical decline.  She had  increased shortness of breath with decreased activity tolerance.  Notable exertional desaturations.  She was treated for a possible ILD flare plus or minus acute bronchitis.  She was given Augmentin x 7 days..  She was started on oxygen 3 L.  She was recommended for hospitalization but declined.  Lab work did show an elevated D-dimer.  Subsequent CT chest that was completed today showed diffuse subpleural reticulation, groundglass that appears to be stable.  Negative PE, nodularity of the right upper lobe measuring 7 x 6 mm appears stable dating back to June 2022, large hiatal hernia, nodular area of increased density near the gastroesophageal junction.  We discussed her CT scan in detail.  Since last visit patient says she is feeling much better.  Oxygen is really helping her.  Feels that she can have increased activity tolerance now.  She does have some mild nasal congestion and cough.  She has 2 days left of Augmentin. Patient says she does have some mild intermittent dysphagia.   She denies any hemoptysis, fever, or discolored mucus.  She is going to DME company this week to see if she qualifies for portable oxygen concentrator    CT Chest data - CTA 11/12/22  Narrative & Impression  CLINICAL DATA:  Positive D-dimer in a 77 year old female, concern for pulmonary embolism in the setting of endometrial cancer in a patient with shortness of breath.   * Tracking Code: BO *   EXAM: CT ANGIOGRAPHY CHEST WITH CONTRAST   TECHNIQUE: Multidetector CT imaging of the chest was performed using the standard protocol during bolus administration of intravenous contrast. Multiplanar CT image reconstructions and MIPs were obtained to evaluate the vascular anatomy.   RADIATION DOSE REDUCTION: This exam was performed according to the departmental dose-optimization program which includes automated exposure control, adjustment of the mA and/or kV according to patient size and/or use of iterative  reconstruction technique.   CONTRAST:  55m ISOVUE-370 IOPAMIDOL (ISOVUE-370) INJECTION 76%   COMPARISON:  April 25, 2021.   FINDINGS: Cardiovascular: Main pulmonary artery is well opacified measuring 4.4 cm greatest axial dimension and showing a density of 541 Hounsfield units. There is no evidence of pulmonary embolism.   Calcified and noncalcified plaque of a nonaneurysmal thoracic aorta. Aorta with smooth contours. Tortuous great vessels in the chest.   Heart size mildly enlarged.  No pericardial effusion. Mitral annular calcifications. Coronary artery calcifications of LEFT coronary circulation.   Mediastinum/Nodes: Very large hiatal hernia. In the distal esophagus there is an area of added density measuring 13 x 15 mm (image 97/4) this is surrounded by an area of even more dense material. No perigastric stranding. No signs of supraclavicular, axillary or mediastinal lymphadenopathy.   Lungs/Pleura: Diffuse subpleural reticulation, ground-glass and interstitial prominence that favor subpleural lung. Pattern is very similar to prior imaging and is not associated with new areas of consolidative change, pneumothorax or pleural effusion.   There is an area of nodularity along bronchovascular structures in the RIGHT upper lobe (image 38/12 this measures approximately 7 x 6 mm and may be contiguous with thickening of bronchovascular structures extending from the RIGHT hilum. This more focal nodular appearance is different than other areas in the chest and appears stable however dating back to June of 2022 measured in retrospect by this observer on the prior study.   Upper Abdomen: Incidental imaging of upper abdominal contents shows no acute process on limited assessment. Stomach is nearly entirely herniated into the chest. This is predominantly paraesophageal herniation but shows small sliding component.   Musculoskeletal: No chest wall abnormality. No acute or  significant osseous findings. Spinal degenerative changes are similar to previous imaging.   Review of the MIP images confirms the above findings.   IMPRESSION: 1. Negative for pulmonary embolism. 2. Enlargement of the main pulmonary artery compatible with pulmonary arterial hypertension in the setting of interstitial lung disease. 3. Large hiatal hernia. Nodular area of increased density near the gastroesophageal junction surrounded by greater area of density. This could represent ingested material. Would however correlate with any evidence of gastrointestinal bleeding and also with recent medication ingestion that could account for these findings. The nodular appearance is moderately concerning and dedicated esophageal assessment may be warranted to exclude underlying lesion in this location. 4. Stable nodular area along bronchovascular structures in the RIGHT upper lobe may simply reflect confluent interstitial fibrosis but would suggest close attention on follow-up. Consider follow-up at 6 months from the current study to ensure continued stability since July of 2022. Could consider evaluation with interstitial lung disease, high-resolution protocol for further assessment as warranted at that time or as based on clinical symptoms. 5. Aortic atherosclerosis.   Aortic Atherosclerosis (ICD10-I70.0).   These results were called by telephone at the time of interpretation on 11/12/2022 at 1:43 pm to provider [facility] PARRETT , who verbally acknowledged these results.     Electronically Signed   By: Donzetta Kohut M.D.   On: 11/12/2022 13:43      OV 12/25/2022  Subjective:  Patient ID: Madison Holden, female , DOB: 04-23-1946 , age 61 y.o. , MRN: [id number] , ADDRESS: [address] [place] [phone] PCP Henrine Screws, MD Patient Care Team: Henrine Screws, MD as PCP - General (Family Medicine)  This Provider for this visit: Treatment Team:  Attending Provider:  Kalman Shan, MD 12/25/2022 -   Chief Complaint  Patient presents with   Follow-up    F/up PFT     HPI Madison Holden 77 y.o. -returns for follow-up.  I personally not seen her in 2022.  In fact in 2023 she never saw any of [place].  During this time she lost a lot of weight.  Weight chart shows that she is lost 40 pounds between September 2022 and currently.  She is denying any dysphagia.  In the most recent CT scan  in January 2024 hiatal hernia is now described as large.  She and her husband could not recollect previous mention of hiatal hernia although the one time I met her in 2022 I did indicate to her per documentation.  She states that she is not having any acid reflux because she takes her medications for this.  Although the husband did indicate that when she takes rice or chicken there is some difficulty.  She has upcoming GI appointment for the hiatal hernia.  In April 2024 with Dr. Quentin Mulling   In terms of respiratory status: January  2024 she presented with significant amount of hypoxemia.  Emergent evaluation showed that she did not have pulmonary embolism.  She has been started on oxygen which she is using 3 L continuously although today she was 93% room air at rest and with exertion she was requiring 5 L correction even to walk 1 lap in our office.  And during this time her FVC has declined at least 12% through February 2024.  She has upcoming echocardiogram January 02, 2023  In terms of rheumatoid arthritis she continues on immunosuppression Enbrel and methotrexate.    OV 02/21/2023  Subjective:  Patient ID: Madison Holden, female , DOB: 01/15/1946 , age 67 y.o. 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  due to RA  -Associated with connective tissue disease rheumatoid arthritis  -Associated with hiatal hernia moderate  -Associated feather pillow use at home September 2022 [no lymphocytes in lavage September 2022]  -Unchanged January 2020 -> July 2022  -Bronchoscopy with lavage September 2022   -11% eosinophils, 60% neutrophils, no lymphocytes and culture negative   -September 2022: Medications associated with eosinophilia include methotrexate, allopurinol, hydrochlorothiazide  Abnormal 6-minute walk test with hypoxemia and nocturnal desaturation-summer 2022  -Refuses oxygen therapy and sleep apnea evaluation   Obesity with sedentary lifestyle using cane and walker  -Weight loss in   - Sept 2022 : 189#   - Jan 2024: 161# -  12/25/2022: 2024" 150# - 02/21/2023 - 151#  Possible diastolic dysfunction on echocardiogram summer 2022  Hiatal hernia hernia   - described as moderate in 2022 -Described as large in 2024 January -Saw Quentin Mulling in GI  Chronic anemia hemoglobin around 10 g% since 2019 through 2024   02/21/2023 -   Chief Complaint  Patient presents with   Follow-up    F/up at heart cath.      HPI MAKYNZEE TIGGES 77 y.o. -returns for follow-up after right heart catheterization.  Husband is here with her as an independent historian right heart catheterization results are below.  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  She did have adding this in response.  She is now set up to see Dr. Marca Ancona on 03/11/2023.  I did go over her inhaled treprostinil with her.  Explained the side effects.  Explained the benefits.  Show this to her and her husband.  They are willing to try that.  I did discuss DPI versus nebulizer.  She prefers the DPI.  In any event I advised her to talk about this with Dr. Marca Ancona and  other therapeutic options against pulmonary hypertension.  She is going to  do that.  For her interstitial lung disease she is now started low-dose nintedanib.  She says she was afraid to start it but finally did start it.  Has been taking low-dose nintedanib 100 mg once daily for a few days.  Next week she will increase it to twice daily.  So far no side effects.  For reported history of sleep apnea she says she actually does not have sleep apnea and that is what the husband who is an independent historian states.   For her large hiatal hernia she has seen Quentin Mulling and GI.  She is on a conservative management plan.    RHC 02/14/23     Mean RA pressure of 6 mmHg, RV pressure of 91/1 with RV end-diastolic pressure of 17 mmHg, PA pressure of 92/31 mmHg with a mean of 56 mmHg and mean wedge pressure of 6 mmHg.   2.  Pulmonary arterial hypertension with a resting mean PA pressure of 56 mmHg; Fick cardiac output was 5.5 L/min resulting in a pulmonary vascular resistance of 9.45 Woods units; thermodilution cardiac output was 4.0 L/min resulting in pulmonary vascular resistance of 12.4 Woods units. 3.  Adenosine vasodilator reactivity: -50 mcg/kg/min adenosine           cardiac output of 3.81 L/min with mean PA pressure of 54 mmHg -100 mcg/kg/min adenosine         cardiac output of 4.0 L/min with a mean PA pressure of 52 mmHg -150 mcg/kg/min adenosine         cardiac output of 3.62 L/min with mean PA pressure of 50 mmHg -200 mcg/kg/min adenosine         cardiac output of 5.29 L/min with mean PA pressure of 49 mmHg -250 mcg/kg/min adenosine         mean PA pressure of 49 mmHg; CO not assessed due to equipment issue -360mcg/kg.min adenosine           mean PA pressure of 46 to 47 mmHg transiently; patient needed to be interrupted due to patient intolerance.   Due to decrease in mean PA pressure of close to 10 mmHg and increase in cardiac output this was suggest a positive vasodilator reactivity challenge     Summary: Group 3 Pulmonary arterial hypertension with  positive vasodilator challenge.   Last Weight  Most recent update: 02/21/2023  1:18 PM    Weight  68.7 kg (151 lb 6.4 oz)              SYMPTOM SCALE - ILD 07/19/2021 02/21/2023   Current weight    O2 use ra   Shortness of Breath 0 -> 5 scale with 5 being worst (score 6 If unable to do)   At rest 0   Simple tasks - showers, clothes change, eating, shaving 0   Household (dishes, doing bed, laundry) 1   Shopping 0   Walking level at own pace 1   Walking up Stairs 3   Total (30-36) Dyspnea Score 5   How bad is your cough? 3   How bad is your fatigue 3   How bad is nausea 0   How bad is vomiting?  0   How bad is diarrhea? 0   How bad is anxiety? 1   How bad is depression 0   Any chronic pain - if so where and how bad Yes from RAM in neck  Simple office walk 185 feet x  3 laps goal with forehead probe 12/25/2022 93% on RA atre   O2 used 5L Pump Back   Number laps completed Half lap   Comments about pace slow   Resting Pulse Ox/HR x% and x/min   Final Pulse Ox/HR x% and x/min   Desaturated </= 88% x   Desaturated <= 3% points x   Got Tachycardic >/= 90/min x   Symptoms at end of test x   Miscellaneous comments Needed 5L to walk 100 feet without deat     PFT     Latest Ref Rng & Units 12/14/2022    1:52 PM 05/24/2021   10:58 AM  PFT Results  FVC-Pre L 1.38  1.57   FVC-Predicted Pre % 60  67   FVC-Post L  1.57   FVC-Predicted Post %  66   Pre FEV1/FVC % % 95  93   Post FEV1/FCV % %  94   FEV1-Pre L 1.31  1.47   FEV1-Predicted Pre % 77  83   FEV1-Post L  1.46   DLCO uncorrected ml/min/mmHg 4.84  7.67   DLCO UNC% % 30  47   DLCO corrected ml/min/mmHg 5.07  7.67   DLCO COR %Predicted % 31  47   DLVA Predicted % 50  65   TLC L  2.73   TLC % Predicted %  63   RV % Predicted %  50        has a past medical history of Ambulates with cane, Anemia, Arthritis, Arthritis, Cancer, uterine (HCC), Chest pain, unspecified (07-01-2018  per pt had 2 day chest pain and sob  6  weeks ago,, per pt no symptoms since), Diarrhea, unspecified, DOE (dyspnea on exertion), Endometrial cancer (HCC), Essential hypertension, Frequency of urination, GERD (gastroesophageal reflux disease), Gout (rheumologist-- michelle young PA ([facility])), Heart murmur, History of adenomatous polyp of colon, History of esophageal dilatation, Mild nausea, Mixed dyslipidemia, Muscle weakness of extremity, Poor appetite, Type 2 diabetes mellitus (HCC), Urgency of urination, and Urine frequency.   reports that she has never smoked. She has never used smokeless tobacco.  Past Surgical History:  Procedure Laterality Date   BREAST CYST EXCISION Right 1974   benign per pt   BRONCHIAL WASHINGS  06/27/2021   Procedure: BRONCHIAL WASHINGS;  Surgeon: Kalman Shan, MD;  Location: [facility];  Service: Endoscopy;;   CATARACT EXTRACTION W/ INTRAOCULAR LENS  IMPLANT, BILATERAL  2014;  2017   MOUTH SURGERY  11/2017   Upper quadrants,  per pt peridontist did laser procedure on gums   RIGHT HEART CATH N/A 02/14/2023   Procedure: RIGHT HEART CATH;  Surgeon: Orbie Pyo, MD;  Location: [facility];  Service: Cardiovascular;  Laterality: N/A;   ROBOTIC ASSISTED TOTAL HYSTERECTOMY WITH BILATERAL SALPINGO OOPHERECTOMY Bilateral 07/22/2018   Procedure: XI ROBOTIC ASSISTED TOTAL HYSTERECTOMY WITH BILATERAL SALPINGO OOPHORECTOMY AND SENTINAL LYMPH NODE BIOPSY;  Surgeon: Adolphus Birchwood, MD;  Location: [facility];  Service: Gynecology;  Laterality: Bilateral;   TONSILLECTOMY  child   VIDEO BRONCHOSCOPY N/A 06/27/2021   Procedure: VIDEO BRONCHOSCOPY WITHOUT FLUORO;  Surgeon: Kalman Shan, MD;  Location: [facility];  Service: Endoscopy;  Laterality: N/A;    Allergies  Allergen Reactions   Lovastatin    Rosuvastatin Other (See Comments)    Immunization History  Administered Date(s) Administered   Moderna Sars-Covid-2 Vaccination 11/26/2019, 12/22/2019, 08/12/2020   Pneumococcal Conjugate-13  01/29/2017   Pneumococcal Polysaccharide-23 08/01/2012   Tdap 01/17/2011  Zoster, Live 05/15/2012    Family History  Problem Relation Age of Onset   Heart disease Mother    Heart failure Mother    Heart disease Father    Prostate cancer Father    Clotting disorder Father    Diabetes Maternal Grandfather    Colon cancer Neg Hx    Liver disease Neg Hx    Esophageal cancer Neg Hx    Cancer - Colon Neg Hx      Current Outpatient Medications:    acetaminophen (TYLENOL) 650 MG CR tablet, Take 650 mg by mouth every 8 (eight) hours as needed for pain., Disp: , Rfl:    allopurinol (ZYLOPRIM) 300 MG tablet, Take 300 mg by mouth daily., Disp: , Rfl:    amLODipine (NORVASC) 2.5 MG tablet, TAKE 1 TABLET BY MOUTH ONCE DAILY, Disp: 90 tablet, Rfl: 3   ASA-APAP-Caff Buffered (VANQUISH PO), Take 2 tablets by mouth 2 (two) times a week., Disp: , Rfl:    B-D TB SYRINGE 1CC/27GX1/2" 27G X 1/2" 1 ML MISC, USE WEEKLY WITH METHOTREXATE SUBCUTANEOUSLY 90 DAYS, Disp: , Rfl:    Baclofen 5 MG TABS, Take 2.5-5 mg by mouth at bedtime as needed (muscle spasms)., Disp: , Rfl:    diphenhydrAMINE (BENADRYL) 25 MG tablet, Take 25 mg by mouth at bedtime as needed for sleep. , Disp: , Rfl:    esomeprazole (NEXIUM) 40 MG capsule, Take 1 capsule (40 mg total) by mouth daily before breakfast., Disp: 90 capsule, Rfl: 3   etanercept (ENBREL) 50 MG/ML injection, Inject 25 mg into the skin 2 (two) times a week., Disp: , Rfl:    famotidine (PEPCID) 40 MG tablet, Take 1 tablet (40 mg total) by mouth at bedtime., Disp: 90 tablet, Rfl: 3   Ferrous Gluconate 324 (37.5 Fe) MG TABS, Take 1 tablet by mouth 2 (two) times daily., Disp: , Rfl:    folic acid (FOLVITE) 800 MCG tablet, Take 800 mcg by mouth daily., Disp: , Rfl:    hydrochlorothiazide (HYDRODIURIL) 25 MG tablet, Take 12.5 mg by mouth daily., Disp: , Rfl:    ketoconazole (NIZORAL) 2 % cream, Apply 1 Application topically daily as needed for irritation., Disp: , Rfl:     Magnesium 500 MG TABS, Take 500 mg by mouth daily., Disp: , Rfl:    Melatonin 3 MG TABS, Take 3-6 mg by mouth at bedtime as needed (sleep). , Disp: , Rfl:    methotrexate 50 MG/2ML injection, 25 mg once a week. 0.5 ml every Friday, Disp: , Rfl:    oxybutynin (DITROPAN) 5 MG tablet, Take 5 mg by mouth 2 (two) times daily., Disp: , Rfl:    pioglitazone (ACTOS) 30 MG tablet, Take 30 mg by mouth daily. , Disp: , Rfl:    rosuvastatin (CRESTOR) 5 MG tablet, Take 1 tablet (5 mg total) by mouth daily. Start with one tablet every Monday, Wednesday, and Friday. Increase to one tablet daily if tolerating well. (Patient taking differently: Take 5 mg by mouth 3 (three) times a week. Start with one tablet every Monday, Wednesday, and Friday.), Disp: 90 tablet, Rfl: 3   vitamin B-12 (CYANOCOBALAMIN) 1000 MCG tablet, Take 1,000 mcg by mouth daily., Disp: , Rfl:    vitamin C (ASCORBIC ACID) 500 MG tablet, Take 500 mg by mouth daily., Disp: , Rfl:       Objective:   Vitals:   02/21/23 1318  BP: 110/60  Pulse: 77  Temp: 98 F (36.7 C)  SpO2: 97%  Weight: 151 lb 6.4 oz (68.7 kg)  Height: 4\' 10"  (1.473 m)    Estimated body mass index is 31.64 kg/m as calculated from the following:   Height as of this encounter: 4\' 10"  (1.473 m).   Weight as of this encounter: 151 lb 6.4 oz (68.7 kg).  @WEIGHTCHANGE @  [employer]   02/21/23 1318  Weight: 151 lb 6.4 oz (68.7 kg)     Physical Exam  General: No distress. FRail . HAS WALKER.  Neuro: Alert and Oriented x 3. GCS 15. Speech normal Psych: Pleasant Resp:  Barrel Chest - no.  Wheeze - no, Crackles - YES, No overt respiratory distress CVS: Normal heart sounds. Murmurs - no Ext: Stigmata of Connective Tissue Disease - RA HEENT: Normal upper airway. PEERL +. No post nasal drip        Assessment:       ICD-10-CM   1. Interstitial lung disease due to connective tissue disease (HCC)  J84.89    M35.9     2. Pulmonary hypertension, low resistance  (HCC)  I27.20     3. Chronic respiratory failure with hypoxia (HCC)  J96.11     4. Hiatal hernia  K44.9          Plan:     Patient Instructions  Interstitial lung disease due to connective tissue disease (HCC) Chronic respiratory failure with hypoxia (HCC) Immunosuppression due to drug therapy (HCC) Encounter for medication monitoring   -  you have had progressive decline in lung function and o2 need -This is due to progressive pulmonary fibrosis and also very likely due to development of pulmonary hypertension -Currently using 3 L oxygen 24/7 using a tank system -So far tolerating week 1 of low-dose nintedanib protocol at 100 mg once daily this week  Plan --Continue Enbrel and methotrexate through [facility] Continue nintedanib 100 mg once daily this week in the next week go up to 100 mg twice daily  -Liver function test in 1 month  WHO group 3 pulmonary hypertension  Plan  - Glad you are going to see Dr. Marca Ancona heart failure specialist 03/11/2023    Follow-up - 4-6 weeks with nurse practitioner for 30-minute visit or video visit following liver function test.    SIGNATURE    Dr. Kalman Shan, M.D., F.C.C.P,  Pulmonary and Critical Care Medicine Staff Physician, [facility] Director - Interstitial Lung Disease  Program  Pulmonary Fibrosis [facility] at [facility] [place], [place], [address]  Pager: [phone], If no answer or between  15:00h - 7:00h: call [phone] Telephone: [phone]  1:59 PM 02/21/2023

## 2023-02-21 ENCOUNTER — Ambulatory Visit (INDEPENDENT_AMBULATORY_CARE_PROVIDER_SITE_OTHER): Payer: Medicare Other | Admitting: Internal Medicine

## 2023-02-21 ENCOUNTER — Encounter: Payer: Self-pay | Admitting: Internal Medicine

## 2023-02-21 VITALS — BP 110/60 | HR 77 | Temp 98.0°F | Ht <= 58 in | Wt 151.4 lb

## 2023-02-21 DIAGNOSIS — I272 Pulmonary hypertension, unspecified: Secondary | ICD-10-CM

## 2023-02-21 DIAGNOSIS — J9611 Chronic respiratory failure with hypoxia: Secondary | ICD-10-CM | POA: Diagnosis not present

## 2023-02-21 DIAGNOSIS — J8489 Other specified interstitial pulmonary diseases: Secondary | ICD-10-CM

## 2023-02-21 DIAGNOSIS — K449 Diaphragmatic hernia without obstruction or gangrene: Secondary | ICD-10-CM

## 2023-02-21 DIAGNOSIS — M359 Systemic involvement of connective tissue, unspecified: Secondary | ICD-10-CM

## 2023-02-21 NOTE — Addendum Note (Signed)
Addended by: Hedda Slade on: 02/21/2023 02:05 PM   Modules accepted: Orders

## 2023-02-22 ENCOUNTER — Other Ambulatory Visit (HOSPITAL_BASED_OUTPATIENT_CLINIC_OR_DEPARTMENT_OTHER): Payer: Self-pay

## 2023-02-22 DIAGNOSIS — M359 Systemic involvement of connective tissue, unspecified: Secondary | ICD-10-CM

## 2023-02-26 ENCOUNTER — Ambulatory Visit (INDEPENDENT_AMBULATORY_CARE_PROVIDER_SITE_OTHER): Payer: Medicare Other | Admitting: Internal Medicine

## 2023-02-26 DIAGNOSIS — M359 Systemic involvement of connective tissue, unspecified: Secondary | ICD-10-CM | POA: Diagnosis not present

## 2023-02-26 DIAGNOSIS — J8489 Other specified interstitial pulmonary diseases: Secondary | ICD-10-CM | POA: Diagnosis not present

## 2023-02-26 LAB — PULMONARY FUNCTION TEST
DL/VA % pred: 66 %
DL/VA: 2.85 ml/min/mmHg/L
DLCO cor % pred: 38 %
DLCO cor: 6.22 ml/min/mmHg
DLCO unc % pred: 36 %
DLCO unc: 5.85 ml/min/mmHg
FEF 25-75 Pre: 2.93 L/sec
FEF2575-%Pred-Pre: 204 %
FEV1-%Pred-Pre: 71 %
FEV1-Pre: 1.2 L
FEV1FVC-%Pred-Pre: 124 %
FEV6-%Pred-Pre: 59 %
FEV6-Pre: 1.27 L
FEV6FVC-%Pred-Pre: 105 %
FVC-%Pred-Pre: 56 %
FVC-Pre: 1.28 L
Pre FEV1/FVC ratio: 94 %
Pre FEV6/FVC Ratio: 100 %

## 2023-02-26 NOTE — Progress Notes (Signed)
Spirometry & DLCO Performed Today.  

## 2023-02-26 NOTE — Patient Instructions (Signed)
Spirometry & DLCO Performed Today.  

## 2023-02-28 ENCOUNTER — Ambulatory Visit: Payer: Medicare Other | Admitting: Internal Medicine

## 2023-03-08 DIAGNOSIS — J8417 Interstitial lung disease with progressive fibrotic phenotype in diseases classified elsewhere: Secondary | ICD-10-CM | POA: Diagnosis not present

## 2023-03-08 DIAGNOSIS — N179 Acute kidney failure, unspecified: Secondary | ICD-10-CM | POA: Diagnosis not present

## 2023-03-08 DIAGNOSIS — R0609 Other forms of dyspnea: Secondary | ICD-10-CM | POA: Diagnosis not present

## 2023-03-08 DIAGNOSIS — C541 Malignant neoplasm of endometrium: Secondary | ICD-10-CM | POA: Diagnosis not present

## 2023-03-11 ENCOUNTER — Other Ambulatory Visit (HOSPITAL_COMMUNITY): Payer: Self-pay

## 2023-03-11 ENCOUNTER — Telehealth (HOSPITAL_COMMUNITY): Payer: Self-pay | Admitting: Pharmacist

## 2023-03-11 ENCOUNTER — Encounter (HOSPITAL_COMMUNITY): Payer: Self-pay | Admitting: Cardiology

## 2023-03-11 ENCOUNTER — Ambulatory Visit (HOSPITAL_COMMUNITY)
Admission: RE | Admit: 2023-03-11 | Discharge: 2023-03-11 | Disposition: A | Discharge status: Home / Self Care | Payer: Medicare Other | Source: Ambulatory Visit | Attending: Cardiology | Admitting: Cardiology

## 2023-03-11 VITALS — BP 110/68 | HR 80 | Wt 147.0 lb

## 2023-03-11 DIAGNOSIS — I498 Other specified cardiac arrhythmias: Secondary | ICD-10-CM | POA: Insufficient documentation

## 2023-03-11 DIAGNOSIS — R600 Localized edema: Secondary | ICD-10-CM | POA: Diagnosis not present

## 2023-03-11 DIAGNOSIS — Z79899 Other long term (current) drug therapy: Secondary | ICD-10-CM | POA: Diagnosis not present

## 2023-03-11 DIAGNOSIS — J9611 Chronic respiratory failure with hypoxia: Secondary | ICD-10-CM | POA: Insufficient documentation

## 2023-03-11 DIAGNOSIS — J8410 Pulmonary fibrosis, unspecified: Secondary | ICD-10-CM | POA: Diagnosis not present

## 2023-03-11 DIAGNOSIS — I2510 Atherosclerotic heart disease of native coronary artery without angina pectoris: Secondary | ICD-10-CM | POA: Diagnosis not present

## 2023-03-11 DIAGNOSIS — I700 Atherosclerosis of aorta: Secondary | ICD-10-CM | POA: Diagnosis not present

## 2023-03-11 DIAGNOSIS — I2721 Secondary pulmonary arterial hypertension: Secondary | ICD-10-CM | POA: Insufficient documentation

## 2023-03-11 DIAGNOSIS — M069 Rheumatoid arthritis, unspecified: Secondary | ICD-10-CM | POA: Insufficient documentation

## 2023-03-11 DIAGNOSIS — I272 Pulmonary hypertension, unspecified: Secondary | ICD-10-CM | POA: Diagnosis not present

## 2023-03-11 DIAGNOSIS — I10 Essential (primary) hypertension: Secondary | ICD-10-CM | POA: Diagnosis not present

## 2023-03-11 DIAGNOSIS — Z8249 Family history of ischemic heart disease and other diseases of the circulatory system: Secondary | ICD-10-CM | POA: Diagnosis not present

## 2023-03-11 LAB — BASIC METABOLIC PANEL
Anion gap: 10 (ref 5–15)
BUN: 22 mg/dL (ref 8–23)
CO2: 21 mmol/L — ABNORMAL LOW (ref 22–32)
Calcium: 8.9 mg/dL (ref 8.9–10.3)
Chloride: 104 mmol/L (ref 98–111)
Creatinine, Ser: 0.98 mg/dL (ref 0.44–1.00)
GFR, Estimated: >60 mL/min (ref >60)
Glucose, Bld: 94 mg/dL (ref 70–99)
Potassium: 3.7 mmol/L (ref 3.5–5.1)
Sodium: 135 mmol/L (ref 135–145)

## 2023-03-11 LAB — BRAIN NATRIURETIC PEPTIDE: B Natriuretic Peptide: 958.9 pg/mL — ABNORMAL HIGH (ref 0.0–100.0)

## 2023-03-11 MED ORDER — FUROSEMIDE 20 MG PO TABS: 20.0000 mg | ORAL_TABLET | Freq: Every day | ORAL | 3 refills | Status: DC

## 2023-03-11 MED ORDER — DAPAGLIFLOZIN PROPANEDIOL 10 MG PO TABS: 10.0000 mg | ORAL_TABLET | Freq: Every day | ORAL | 11 refills | Status: DC

## 2023-03-11 NOTE — Telephone Encounter (Signed)
Patient Advocate Encounter   Received notification from [facility] that prior authorization for Tyvaso DPI is required.   PA submitted on CoverMyMeds Key [id number] Status is pending   Will continue to follow.   Karle Plumber, PharmD, BCPS, BCCP, CPP Heart Failure Clinic Pharmacist [phone]

## 2023-03-11 NOTE — Patient Instructions (Addendum)
START Farxiga 10 mg daily.  PLEASE BE ON THE LOOK OUT FOR A PHONE CALL EITHER FROM [facility] OR [facility] FOR YOUR TYVASO.  Change Lasix to 20 mg daily.  STOP Actos  STOP HCTZ,  Labs done today, your results will be available in MyChart, we will contact you for abnormal readings.  Repeat blood work in 10 days.  Your provider has order a scan of your lungs. ONCE YOUR INSURANCE APPROVES THE TEST. YOU WILL BE CALLED TO HAVE THE TEST ARRANGED.  You will also need to have a chest x-ray before the lung scan on the same day.   Your physician recommends that you schedule a follow-up appointment in: 1 month  If you have any questions or concerns before your next appointment please send [place] a message through [place] or call our office at [phone].    TO LEAVE A MESSAGE FOR THE NURSE SELECT OPTION 2, PLEASE LEAVE A MESSAGE INCLUDING: YOUR NAME DATE OF BIRTH CALL BACK NUMBER REASON FOR CALL**this is important as we prioritize the call backs  YOU WILL RECEIVE A CALL BACK THE SAME DAY AS LONG AS YOU CALL BEFORE 4:00 PM  At the [facility], you and your health needs are our priority. As part of our continuing mission to provide you with exceptional heart care, we have created designated Provider Care Teams. These Care Teams include your primary Cardiologist (physician) and Advanced Practice Providers (APPs- Physician Assistants and Nurse Practitioners) who all work together to provide you with the care you need, when you need it.   You may see any of the following providers on your designated Care Team at your next follow up: Dr Arvilla Meres Dr Marca Ancona Dr. Marcos Eke, NP Robbie Lis, [place] [facility] [place], [place] Brynda Peon, NP Karle Plumber, PharmD   Please be sure to bring in all your medications bottles to every appointment.    Thank you for choosing [facility]

## 2023-03-11 NOTE — Progress Notes (Signed)
PCP: Henrine Screws, MD Cardiology: Dr. Tenny Craw HF Cardiology: Dr. Shirlee Latch  77 y.o. with history of rheumatoid arthritis, pulmonary fibrosis, pulmonary hypertension/RV failure was referred by Dr. Tenny Craw for evaluation of pulmonary hypertension. Patient has had RA since 2020.  She is on MTX and Enbrel.  She has also been diagnosed with pulmonary fibrosis, likely due to RA.  She wears 3L home oxygen.  She has seen Dr. Marchelle Gearing and Durel Salts was recommended, but she has not started it.  Echo in 3/24 showed EF 60-65%, moderate LVH, D-shaped interventricular septum, moderate RV enlargement with normal systolic function, PASP 43 mmHg.  RHC showed severe PAH, vasodilator challenge was done. She has bilateral R>L lower leg edema, venous doppler evaluation in 1/24 showed no DVT.   Patient is very limited at baseline. She came in a wheelchair today.  She is able to walk short distances around her house with a walker.  She gets very fatigued and short of breath with this.  Also limited by hip pain. No chest pain.  Mild orthopnea, no PND.   6 minute walk (5/24): 9 m  Labs (4/24): LDL 28, K 4.4, creatinine 0.98  ECG (personally reviewed): NSR, RVH  PMH: 1. HTN 2. Type 2 diabetes 3. Hyperlipidemia 4. Pulmonary fibrosis: Suspect that this is rheumatoid arthritis-related.  - HRCT chest (4/24): interstitial lung disease.  - On 3L home oxygen 5. Uterine cancer: s/p TAH/BSO.  6. Rheumatoid arthritis: Diagnosed 2020.  On Enbrel and MTX.  7. Gout 8. RV failure/pulmonary hypertension:  - Echo (3/24): EF 60-65%, moderate LVH, D-shaped interventricular septum, moderate RV enlargement with normal systolic function, PASP 43 mmHg.  - RHC (4/24): mean RA 6, PA 92/31 mean 56, mean PCWP 6, CO 5.5 F with PVR 9.45, CO 4 T with PVR 12.4 WU. Adenosine challenge done, mean PAP decreased to 46-47.  9. CAD: Coronary artery calcium noted on CTs.  - Cardiolite (2019): normal study.  10. Lower extremity venous dopplers (1/24): No DVT.    Social History   Socioeconomic History   Marital status: Married    Spouse name: Not on file   Number of children: 0   Years of education: Not on file   Highest education level: Not on file  Occupational History   Occupation: retired   Occupation: retired  Tobacco Use   Smoking status: Never   Smokeless tobacco: Never  Vaping Use   Vaping Use: Never used  Substance and Sexual Activity   Alcohol use: Not Currently    Comment: 2-3 per  week, not currently drinking since Apri; 2019   Drug use: No   Sexual activity: Not on file  Other Topics Concern   Not on file  Social History Narrative   Has never smoked. Daily Caffeine Use- 2 cups per day. Does not get regular exercise. Retired.    Social Determinants of Health   Financial Resource Strain: Not on file  Food Insecurity: Not on file  Transportation Needs: Not on file  Physical Activity: Not on file  Stress: Not on file  Social Connections: Not on file  Intimate Partner Violence: Not on file   Family History  Problem Relation Age of Onset   Heart disease Mother    Heart failure Mother    Heart disease Father    Prostate cancer Father    Clotting disorder Father    Diabetes Maternal Grandfather    Colon cancer Neg Hx    Liver disease Neg Hx    Esophageal cancer  Neg Hx    Cancer - Colon Neg Hx    ROS: All systems reviewed and negative except as per HPI.   Current Outpatient Medications  Medication Sig Dispense Refill   acetaminophen (TYLENOL) 650 MG CR tablet Take 650 mg by mouth every 8 (eight) hours as needed for pain.     allopurinol (ZYLOPRIM) 300 MG tablet Take 300 mg by mouth daily.     amLODipine (NORVASC) 2.5 MG tablet TAKE 1 TABLET BY MOUTH ONCE DAILY 90 tablet 3   ASA-APAP-Caff Buffered (VANQUISH PO) Take 2 tablets by mouth 2 (two) times a week.     B-D TB SYRINGE 1CC/27GX1/2" 27G X 1/2" 1 ML MISC USE WEEKLY WITH METHOTREXATE SUBCUTANEOUSLY 90 DAYS     Baclofen 5 MG TABS Take 2.5-5 mg by mouth at  bedtime as needed (muscle spasms).     dapagliflozin propanediol (FARXIGA) 10 MG TABS tablet Take 1 tablet (10 mg total) by mouth daily before breakfast. 30 tablet 11   diphenhydrAMINE (BENADRYL) 25 MG tablet Take 25 mg by mouth at bedtime as needed for sleep.      esomeprazole (NEXIUM) 40 MG capsule Take 1 capsule (40 mg total) by mouth daily before breakfast. 90 capsule 3   etanercept (ENBREL) 50 MG/ML injection Inject 25 mg into the skin 2 (two) times a week.     Ferrous Gluconate 324 (37.5 Fe) MG TABS Take 1 tablet by mouth 2 (two) times daily.     folic acid (FOLVITE) 800 MCG tablet Take 800 mcg by mouth daily.     furosemide (LASIX) 20 MG tablet Take 1 tablet (20 mg total) by mouth daily. 90 tablet 3   ketoconazole (NIZORAL) 2 % cream Apply 1 Application topically daily as needed for irritation.     Melatonin 3 MG TABS Take 3-6 mg by mouth at bedtime as needed (sleep).      methotrexate 50 MG/2ML injection 25 mg once a week. 0.5 ml every Friday     oxybutynin (DITROPAN) 5 MG tablet Take 5 mg by mouth 2 (two) times daily.     rosuvastatin (CRESTOR) 5 MG tablet Take 1 tablet (5 mg total) by mouth daily. Start with one tablet every Monday, Wednesday, and Friday. Increase to one tablet daily if tolerating well. 90 tablet 3   vitamin B-12 (CYANOCOBALAMIN) 1000 MCG tablet Take 1,000 mcg by mouth daily.     vitamin C (ASCORBIC ACID) 500 MG tablet Take 500 mg by mouth daily.     No current facility-administered medications for this encounter.   BP 110/68   Pulse 80   Wt 66.7 kg (147 lb)   SpO2 99% Comment: 3 l n/c  BMI 30.72 kg/m  General: NAD, frail.  Neck: JVP 8 cm, no thyromegaly or thyroid nodule.  Lungs: Dry crackles at bases.  CV: Nondisplaced PMI.  Heart regular S1/S2, no S3/S4, no murmur.  1+ R>L lower leg edema.  No carotid bruit.  Normal pedal pulses.  Abdomen: Soft, nontender, no hepatosplenomegaly, no distention.  Skin: Intact without lesions or rashes.  Neurologic: Alert and  oriented x 3.  Psych: Normal affect. Extremities: No clubbing or cyanosis.  HEENT: Normal.   Assessment/Plan: 1. Interstitial lung disease: Pulmonary fibrosis, had HRCT in 4/24. Possibly related to rheumatoid arthritis.  Followed by Dr. Marchelle Gearing, Durel Salts has been recommended.  2. Rheumatoid arthritis: On Enbrel and MTX.  3. HTN: BP is controlled.  4. Pulmonary hypertension/RV failure: Echo in 3/24 with EF 60-65%, moderate LVH, D-shaped  interventricular septum, moderate RV enlargement with normal systolic function, PASP 43 mmHg. RHC (4/24) showed mean RA 6, PA 92/31 mean 56, mean PCWP 6, CO 5.5 F with PVR 9.45, CO 4 T with PVR 12.4 WU. Adenosine challenge done, mean PAP decreased to 46-47. Pulmonary arterial hypertension.  The adenosine challenge was not positive, mean PAP dropped 9-10 mmHg, but not to a value < 40 mmHg.  Patient also was already on amlodipine (CCB). 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 She is volume overloaded on exam, NYHA class IIIb with very poor 6 minute walk.  - Start Tyvaso DPI for PH-ILD.  - Stop Actos, start Farxiga 10 mg daily. BMET/BNP today and BMET in 10 days.  - Stop HCTZ, start Lasix 20 mg daily.  - She needs a V/Q scan to rule out chronic PE.  5. Asymmetric lower extremity edema: Doubt DVT, normal lower extremity venous dopplers in 1/24.  6. CAD: Coronary calcium noted on CTs.  - Continue Crestor 5 mg  Followup with me in 1 month.   Marca Ancona 03/12/2023

## 2023-03-11 NOTE — Progress Notes (Addendum)
6 Min Walk Test Completed  Pt ambulated 30 feet  O2 Sat ranged 98%-88% on 3-4L oxygen HR ranged 88-125

## 2023-03-12 NOTE — Telephone Encounter (Signed)
Advanced Heart Failure Patient Advocate Encounter  Prior Authorization for Tyvaso DPI titration kit - 16, 32 and 48 mcg has been approved.    PA#  [id number] Effective dates: 03/11/23 through 03/10/24

## 2023-03-13 NOTE — Telephone Encounter (Signed)
Sent in enrollment application to [facility] for Tyvaso DPI.    Application pending, will continue to follow.   Karle Plumber, PharmD, BCPS, BCCP, CPP Heart Failure Clinic Pharmacist [phone]

## 2023-03-13 NOTE — Telephone Encounter (Signed)
ATC patient to determine Ofev tolerability and if she would like to move forward with filling Ofev through insurance. Unable to reach. Left VM  Chesley Mires, PharmD, MPH, BCPS, CPP Clinical Pharmacist (Rheumatology and Pulmonology)

## 2023-03-15 ENCOUNTER — Telehealth: Payer: Self-pay | Admitting: Pharmacist

## 2023-03-15 NOTE — Telephone Encounter (Signed)
PT calling saying she was to have contacted our Pharmacy. Messaged Ms. Devki but she was not in. Told PT I would leave a message for her to call her back.  [phone]  They gave her some samples of a variety of medicines to try asking her what was working for her. The problem is Dr. Jearld Pies is taking over her heart care. He has changed all her meds and she wonders what is she going to do now. Also, her oxygenator has been replaced with a whole new system. She has not started on the Ofev but it has caused bad diarrhea. So she is very hesitant to start it. In short she does want to continue on the sample medicines and wanted to ask you for more information.

## 2023-03-15 NOTE — Telephone Encounter (Signed)
Returned call to patient regarding Ofev. She states she never started Ofev even after OV with Dr. Ramaswamy when she had stated that she had. She has been working on getting oxygenator replaced and other home repairs which have side tracked her.  Patient states she has baseline diarrhea already and was scared to take Ofev but she did not tell Dr. Ramaswamy. She was truly planning on starting Ofev that first week of May but had so many other appointments and issues so never got around to it.  She will start Ofev this week. Dose: 100mg once daily x 7 days then 100mg twice daily  She will notify us if any side effects. Reviewed monitoring. She is on MTX so likely already having LFTs monitored regularly but reviewed that for Ofev, we check monthly x 3 months. If she prefers, she can have rheumatologist copy any labs results to Dr. Ramaswamy and Dr. McLean so we do not duplicate. She verbalized understanding.  Gopal Malter, PharmD, MPH, BCPS, CPP Clinical Pharmacist (Rheumatology and Pulmonology) 

## 2023-03-15 NOTE — Telephone Encounter (Signed)
Returned call to patient regarding Ofev. She states she never started Ofev even after OV with Dr. Marchelle Gearing when she had stated that she had. She has been working on getting oxygenator replaced and other home repairs which have side tracked her.  Patient states she has baseline diarrhea already and was scared to take Ofev but she did not tell Dr. Marchelle Gearing. She was truly planning on starting Ofev that first week of May but had so many other appointments and issues so never got around to it.  She will start Ofev this week. Dose: 100mg  once daily x 7 days then 100mg  twice daily  She will notify us if any side effects. Reviewed monitoring. She is on MTX so likely already having LFTs monitored regularly but reviewed that for Ofev, we check monthly x 3 months. If she prefers, she can have rheumatologist copy any labs results to Dr. Marchelle Gearing and Dr. Shirlee Latch so we do not duplicate. She verbalized understanding.  Chesley Mires, PharmD, MPH, BCPS, CPP Clinical Pharmacist (Rheumatology and Pulmonology)

## 2023-03-21 ENCOUNTER — Ambulatory Visit (HOSPITAL_COMMUNITY)
Admission: RE | Admit: 2023-03-21 | Discharge: 2023-03-21 | Disposition: A | Discharge status: Home / Self Care | Payer: Medicare Other | Source: Ambulatory Visit | Attending: Internal Medicine | Admitting: Internal Medicine

## 2023-03-21 DIAGNOSIS — I700 Atherosclerosis of aorta: Secondary | ICD-10-CM | POA: Insufficient documentation

## 2023-03-21 LAB — BASIC METABOLIC PANEL
Anion gap: 15 (ref 5–15)
BUN: 29 mg/dL — ABNORMAL HIGH (ref 8–23)
CO2: 22 mmol/L (ref 22–32)
Calcium: 8.7 mg/dL — ABNORMAL LOW (ref 8.9–10.3)
Chloride: 96 mmol/L — ABNORMAL LOW (ref 98–111)
Creatinine, Ser: 1.06 mg/dL — ABNORMAL HIGH (ref 0.44–1.00)
GFR, Estimated: 55 mL/min — ABNORMAL LOW (ref >60)
Glucose, Bld: 87 mg/dL (ref 70–99)
Potassium: 3.4 mmol/L — ABNORMAL LOW (ref 3.5–5.1)
Sodium: 133 mmol/L — ABNORMAL LOW (ref 135–145)

## 2023-03-22 ENCOUNTER — Telehealth: Payer: Self-pay | Admitting: Internal Medicine

## 2023-03-22 ENCOUNTER — Telehealth (HOSPITAL_COMMUNITY): Payer: Self-pay

## 2023-03-22 DIAGNOSIS — I272 Pulmonary hypertension, unspecified: Secondary | ICD-10-CM

## 2023-03-22 DIAGNOSIS — S60454A Superficial foreign body of right ring finger, initial encounter: Secondary | ICD-10-CM | POA: Diagnosis not present

## 2023-03-22 MED ORDER — POTASSIUM CHLORIDE CRYS ER 20 MEQ PO TBCR: 20.0000 meq | EXTENDED_RELEASE_TABLET | Freq: Every day | ORAL | 5 refills | Status: DC

## 2023-03-22 NOTE — Telephone Encounter (Signed)
Patient advised and verbalized understanding. Patient will callback to schedule lab appointment, med list updated to reflect changes.   Meds ordered this encounter  Medications   potassium chloride SA (KLOR-CON M20) 20 MEQ tablet    Sig: Take 1 tablet (20 mEq total) by mouth daily.    Dispense:  30 tablet    Refill:  5   Orders Placed This Encounter  Procedures   Basic metabolic panel    Standing Status:   Future    Standing Expiration Date:   03/21/2024    Order Specific Question:   Release to patient    Answer:   Immediate    Order Specific Question:   Release to patient    Answer:   Immediate [1]

## 2023-03-22 NOTE — Telephone Encounter (Signed)
STOP OFEV. I saw her 02/21/23 . She wa ssuppsed to see APP in [facility] June 2024 but I see no appt. She should keep upcoming GI appt and see APP mid June 2024

## 2023-03-22 NOTE — Telephone Encounter (Signed)
-----   Message from Laurey Morale, MD sent at 03/21/2023 12:18 PM EDT ----- Increase total daily KCl by 20 mEq.  BMET 2 wks.

## 2023-03-22 NOTE — Telephone Encounter (Signed)
I spoke to pt and she reiterated message below. I informed pt I will send Dr Marchelle Gearing a message for his recommendations pt verbalized understanding.   Lauretta Grill A   Telephone Encounter Signed   Creation Time: 03/22/2023  9:20 AM   Signed     Pt has started Ofev and she started it later than expected. Pt has now been taking it about a week, only one a day. Pt is having difficulty tolerating it, she is experiencing diarrhea. Pt is starting to eat once a day bc she never know when she has to go to restroom. She had a accident when she used the restroom on herself. She was hoping the medication would help, but its being harder on her.Pt is also seeing her heart doctor and now he has changed her medicine. Pt is in all new medications and she wants to make sure they aren't interfering. Pt has times when she is stooping over, she is getting very light headed and dizzy but doesn't know the cause. Pt is stating after changing all of her meds its hard to decipher what exactly can be the cause of the diarrhea.

## 2023-03-22 NOTE — Telephone Encounter (Signed)
Lm x1 for patient.  

## 2023-03-22 NOTE — Telephone Encounter (Signed)
Pt has started Ofev and she started it later than expected. Pt has now been taking it about a week, only one a day. Pt is having difficulty tolerating it, she is experiencing diarrhea. Pt is starting to eat once a day bc she never know when she has to go to restroom. She had a accident when she used the restroom on herself. She was hoping the medication would help, but its being harder on her.Pt is also seeing her heart doctor and now he has changed her medicine. Pt is in all new medications and she wants to make sure they aren't interfering. Pt has times when she is stooping over, she is getting very light headed and dizzy but doesn't know the cause. Pt is stating after changing all of her meds its hard to decipher what exactly can be the cause of the diarrhea.

## 2023-03-25 ENCOUNTER — Telehealth (HOSPITAL_COMMUNITY): Payer: Self-pay | Admitting: Cardiology

## 2023-03-25 DIAGNOSIS — I272 Pulmonary hypertension, unspecified: Secondary | ICD-10-CM

## 2023-03-25 MED ORDER — FUROSEMIDE 20 MG PO TABS: 20.0000 mg | ORAL_TABLET | ORAL | 3 refills | Status: DC

## 2023-03-25 NOTE — Addendum Note (Signed)
Addended by: Noely Kuhnle, Milagros Reap on: 03/25/2023 01:25 PM   Modules accepted: Orders

## 2023-03-25 NOTE — Telephone Encounter (Signed)
Ok to change Lasix to every other day rather than daily.  Get BMET.

## 2023-03-25 NOTE — Telephone Encounter (Signed)
Pt aware.

## 2023-03-25 NOTE — Telephone Encounter (Signed)
Spoke with patient regarding prior message.Patient stated she has already stopped Ofev.Patient does have a f/u with MR on 04/30/2023.  Patient's voice was understanding.  Nothing else further needed.

## 2023-03-25 NOTE — Telephone Encounter (Signed)
Patient called with multiple concerns for provider   -late starting farxiga :start date 03/23/23 as opposed to 03/11/23  -concerns with increase UOP and multiple trips to the restroom  Would like to stop LASIX (currently taking late afternoon)  -notes change in vision in the past week (reports appt with two eye doctor)  -c/o increase dizziness (while bending over)  B/P 104-75 Weight 147 stable   -Ok to leave message if she is unable to get to the phone-   Please advise

## 2023-03-29 NOTE — Telephone Encounter (Signed)
Patient has stopped Ofev due to diarrhea at low dose. Will close encounter to f/u  Chesley Mires, PharmD, MPH, BCPS, CPP Clinical Pharmacist (Rheumatology and Pulmonology)

## 2023-04-01 IMAGING — CT CT CHEST W/ CM
2 of 3 series · 15 of 36 positions shown, 18 images · IV contrast (Omnipaque)
Comparison: 11/20/2018.

CLINICAL DATA: Pneumonia, cough since August 2020. Shortness of
breath. Endometrial cancer.

EXAM:
CT CHEST WITH CONTRAST
TECHNIQUE: Multidetector CT imaging of the chest was performed during
intravenous contrast administration.
CONTRAST:  75mL OMNIPAQUE IOHEXOL 300 MG/ML  SOLN

[Series 2: axial st · axial · 0.78mm/px · z∈[-331,-87]mm · 12 of 144 slices shown, 15 images]
[im 11/144  mediastinal]
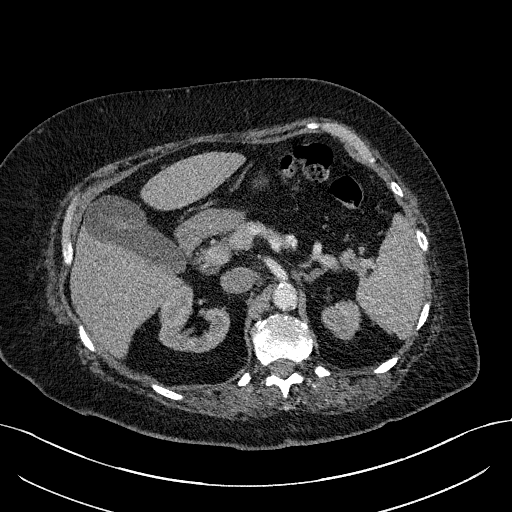
[im 11/144  lung]
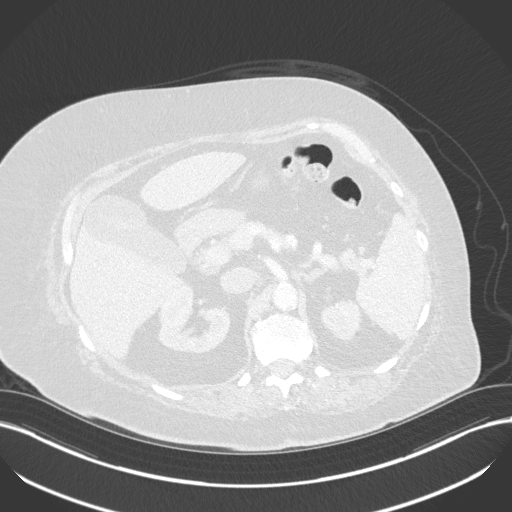
[im 22/144  lung]
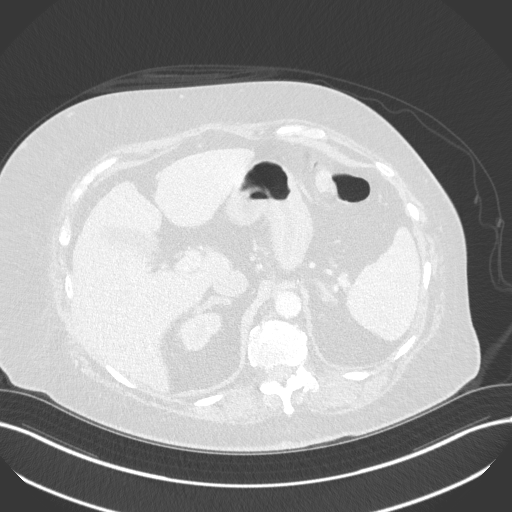
[im 32/144  lung]
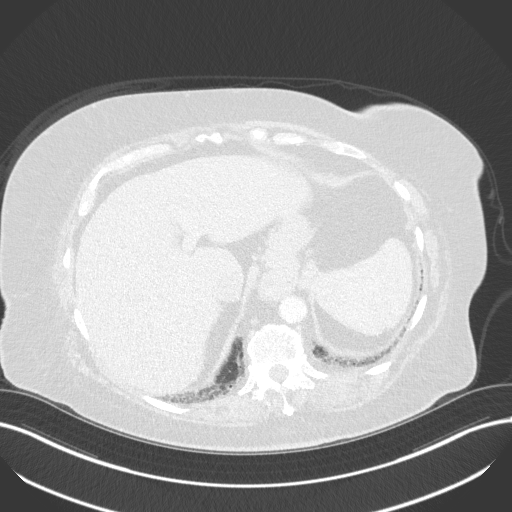
[im 43/144  lung]
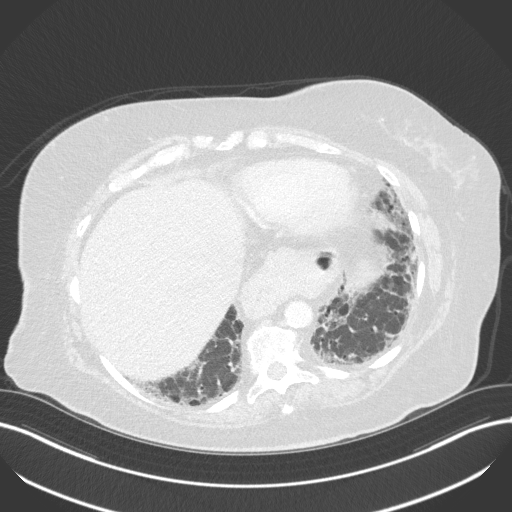
[im 53/144  mediastinal]
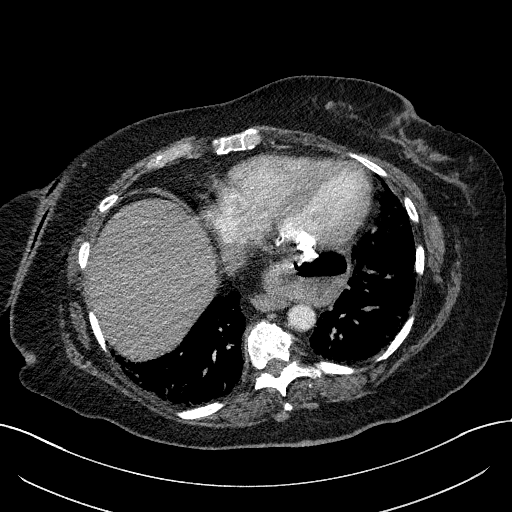
[im 53/144  lung]
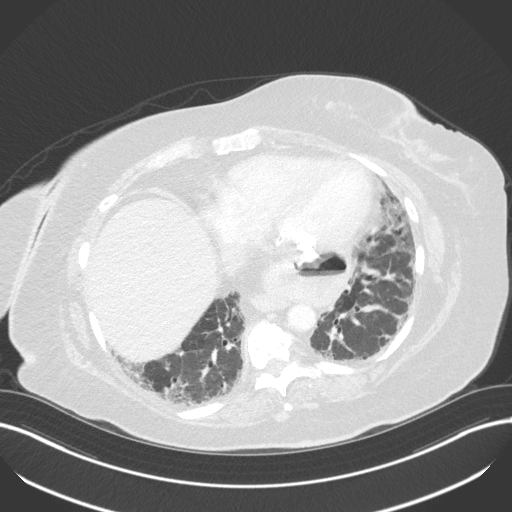
[im 64/144  lung]
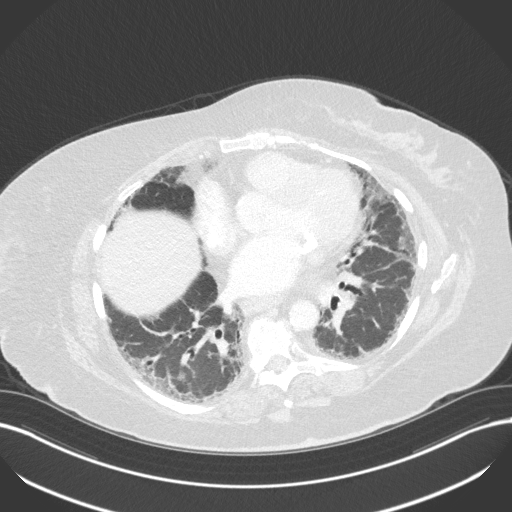
[im 80/144  lung]
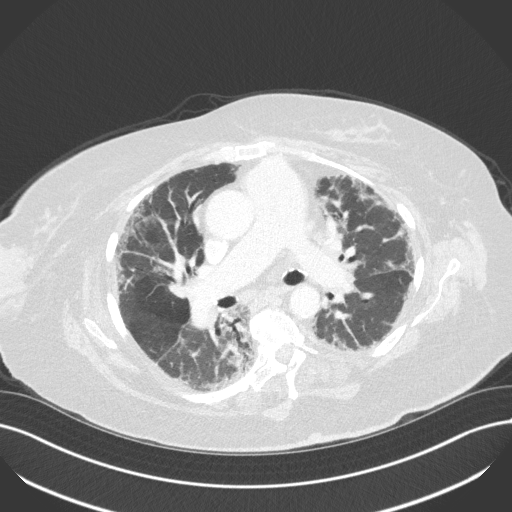
[im 91/144  lung]
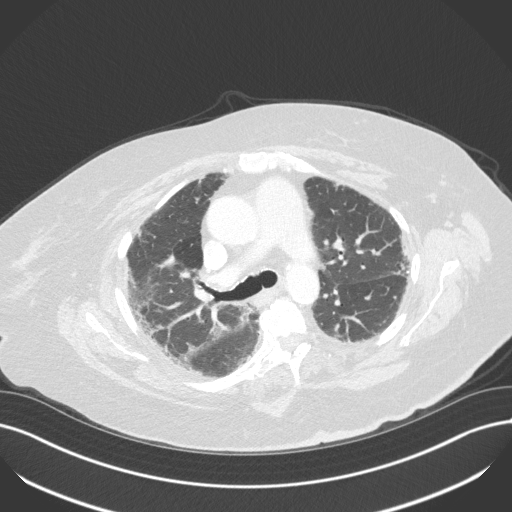
[im 101/144  mediastinal]
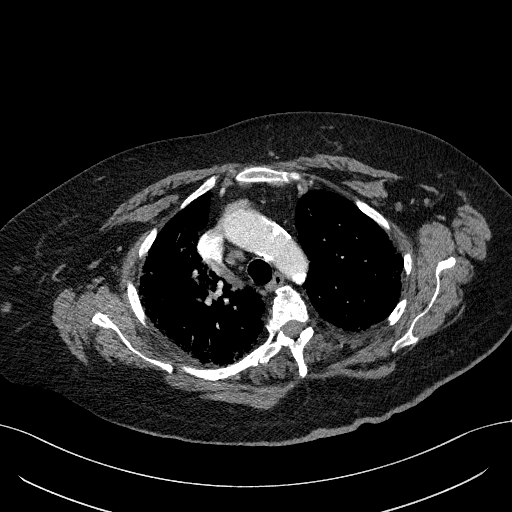
[im 101/144  lung]
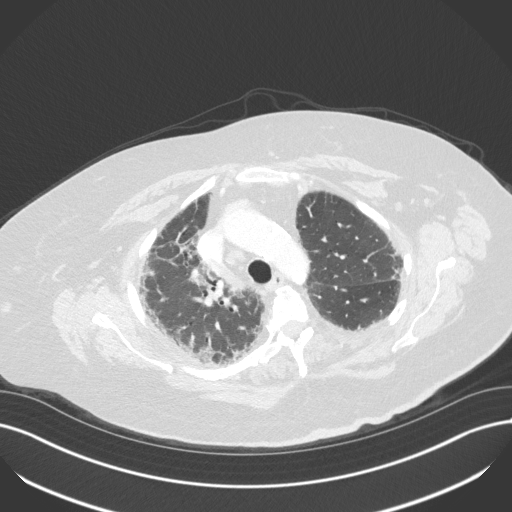
[im 112/144  lung]
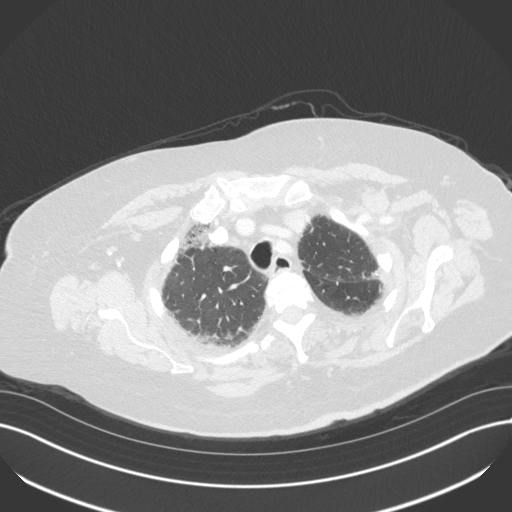
[im 122/144  lung]
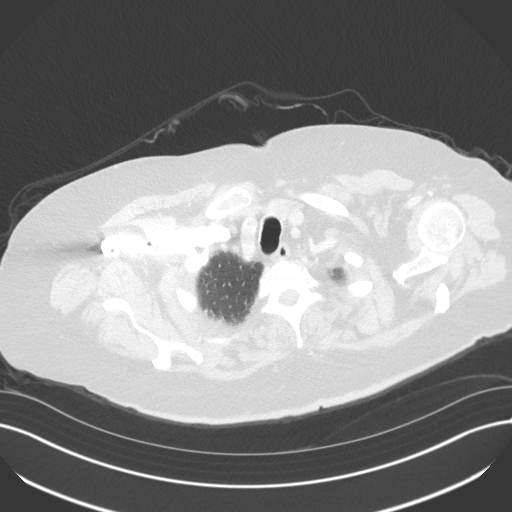
[im 133/144  lung]
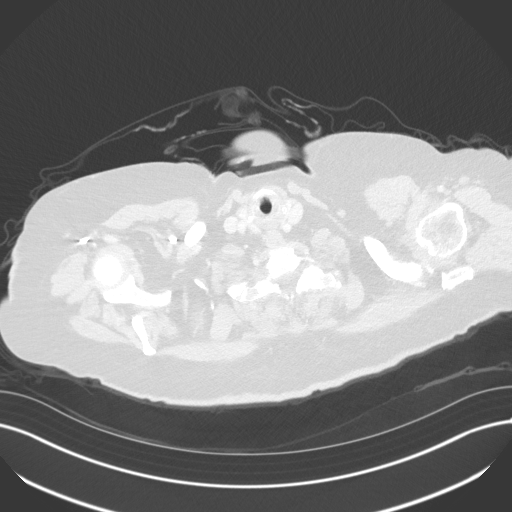

[Series 5: coronal · coronal · 0.59mm/px · 3 of 151 slices shown]
[im 31/151  lung]
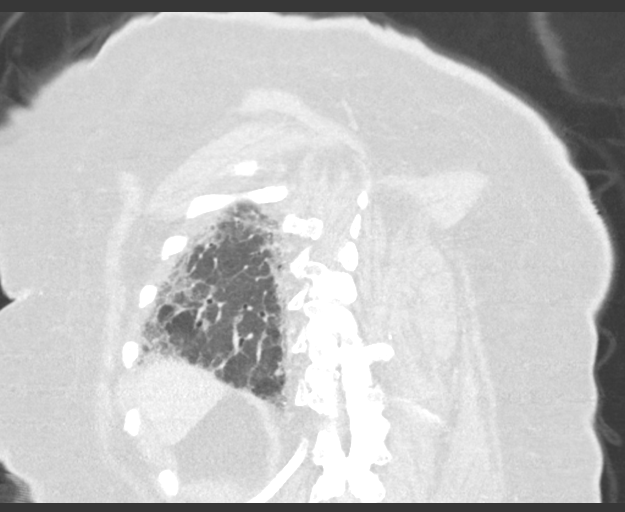
[im 61/151  lung]
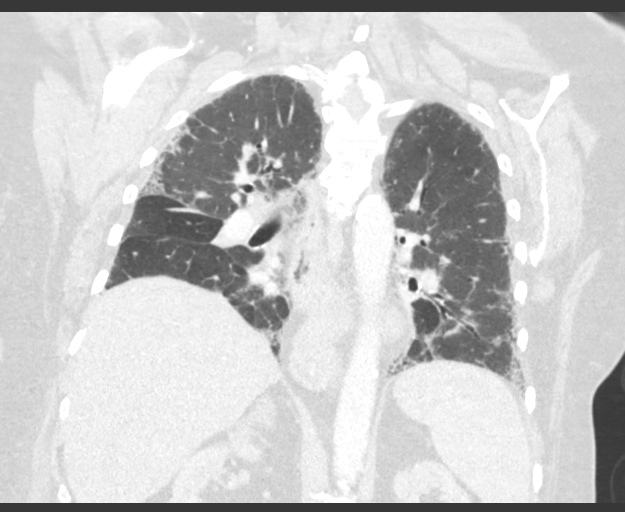
[im 91/151  lung]
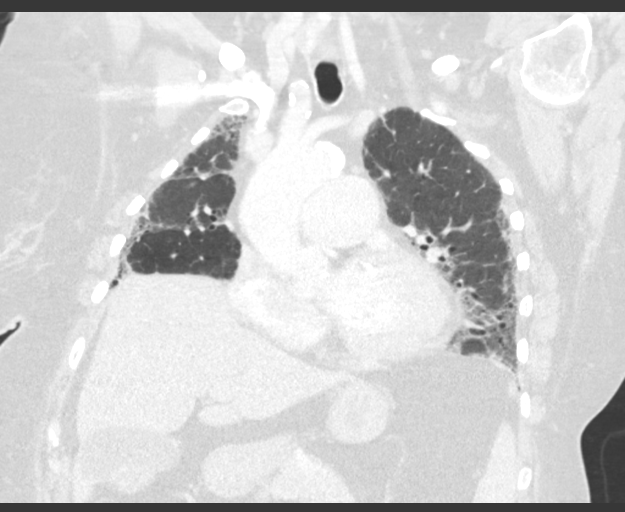

[15 of 36 positions shown; findings below may reference images not displayed]

FINDINGS: Cardiovascular: Atherosclerotic calcification of the aorta, aortic
valve and coronary arteries. Pulmonic trunk and heart are enlarged.
No pericardial effusion.

Mediastinum/Nodes: Subcentimeter low-attenuation lesion in the right
thyroid. No follow-up recommended. (Ref: [HOSPITAL]. [DATE]): 143-50).Mediastinal lymph nodes measure up to 1.3 cm in
the low right paratracheal station, unchanged. Bihilar lymph nodes
measure up to 1.3 cm on the right, also unchanged. No axillary
adenopathy. Esophagus is grossly unremarkable. Moderate hiatal
hernia.

Lungs/Pleura: Peripheral and somewhat basilar predominant subpleural
reticulation, ground-glass and traction
bronchiectasis/bronchiolectasis, as on 11/20/2018. No pleural fluid.
Airway is unremarkable.

Upper Abdomen: Visualized portions of the liver, gallbladder and
adrenal glands are unremarkable. Cortical scarring in the kidneys
bilaterally. Kidneys, spleen, pancreas, stomach and bowel are
otherwise unremarkable with the exception of a moderate hiatal
hernia.

Musculoskeletal: Degenerative changes in the spine. No worrisome
lytic or sclerotic lesions.
IMPRESSION: 1. Pulmonary parenchymal pattern of fibrosis is unchanged from
11/20/2018 and most likely due to usual interstitial pneumonitis.
Future evaluation utilizing high-resolution chest CT without
contrast is suggested.
2. Chronically enlarged mediastinal and hilar lymph nodes, likely
related interstitial lung disease.
3. Moderate hiatal hernia.
4. Aortic atherosclerosis ([id number]-L27.7). Coronary artery
calcification.
5. Enlarged pulmonic trunk, indicative of pulmonary arterial
hypertension.

## 2023-04-01 NOTE — Progress Notes (Signed)
04/02/2023 Madison Holden [id number] 09-17-1946  Referring provider: Henrine Screws, MD Primary GI doctor: Dr. Rhea Belton  ASSESSMENT AND PLAN:   Hiatal hernia with dysphagia, no true reflux symptoms per patient Patient has multiple comorbid conditions, worsening ILD, pulmonary hypertension, chronic respiratory failure with hypoxia on 3 L nasal cannula, rheumatoid arthritis and morbid obesity, due to these comorbid conditions patient's very high risk for endoscopic procedures. Upper GI showed, moderate hiatal hernia, moderate esophageal dysmotility, possible GE junction narrowing but no barium tablet done, no mucosal abnormalities of stomach or duodenum. Patient is been on Nexium 40 mg once daily and Pepcid at night and denies any further dysphagia unless it is with barium tablet. Also patient had more upper esophageal dysphagia rather than lower esophagus prior. No need for repeat barium tablet at this time, patient and husband instructed if she begins to have worsening dysphagia to call our office and we will schedule swallow study. If she continues to have issues would suggest increasing Nexium to twice a day Follow-up 6 months, will call sooner if any issues  Abnormal stools Patient not having true diarrhea, bowel movement once daily, not well-formed stool with prolonged toilet time. Denies hematochezia or melena or rectal pain. No diarrhea since being off pulmonary hypertension medication Add on fiber supplement, possible pelvic floor component, get squatty potty, given breathing exercises. Patient will call if she has 3 or more bowel movements a day or any associated symptoms.  ILD (interstitial lung disease) with chronic respiratory failure with hypoxia, pulmonary hypertension Getting workup with pulmonary and cardiology at this time to try to maximize medications for her lungs, worsening respiratory status since November, currently on continuous oxygen 3 L decreased ADLs,  decreased movement, shortness of breath even with talking. Continue follow-up with cardiology/pulmonary  Morbid obesity Weight loss discussed with the patient, may potentially help hiatal hernia.  Connective tissue disease Continue follow-up with rheumatologist    Patient Care Team: Henrine Screws, MD as PCP - General (Family Medicine)  HISTORY OF PRESENT ILLNESS: 77 y.o. female with a past medical history of ILD, PHTN, chronic hypoxic respiratory failure on 3 L, DM, RA, history of endometrial cancer, morbid obesity, and others listed below presents for evaluation of hiatal hernia.  Previously known to Dr. Juanda Chance in 2016  12/29/2014 colonoscopy Dr. Juanda Chance for screening purposes good prep completely normal colonoscopy recall 10 years She walks with cane or rolline walkers, states last Oct/Nov 2023 everything started to get worse with her breathing.  11/12/2022 CT angio chest for elevated D-dimer and shortness of breath was negative for PE but did show pulmonary arterial hypertension in setting of interstitial lung disease as well as large hiatal hernia, nodule area of increased density near the GE junction started by greater density.  Could represent ingested material.  Exclude underlying lesion in that location. 01/02/2023 echocardiogram EF 60-65%, elevated RASP consistent with pulmonary hypertension, moderate regurg pulmonic valve, following up with Dr. Tenny Craw for potential right heart cath for evaluation of pulmonary hypertension.  Has history of interstitial lung disease, follows with Dr. Marchelle Gearing, has desaturations with exertion, hypoxemia at night, she is now on continuous O2 3L  01/25/2023 office visit with myself for reflux, dysphagia.  Increase Nexium 40 mg with Pepcid at night. 02/11/2023 upper GI showed moderate size hiatal hernia similar to recent chest CT, moderate esophageal dysmotility, possible luminal narrowing of the gastroesophageal junction without evidence of mucosal ulceration  no barium tablet.  Incomplete gastric distention.  No mucosal abnormalities identified  in stomach or duodenum. Suggest barium swallow with tablet but unable to reach patient. 02/14/2023 right heart cath Group 3 Pulmonary arterial hypertension with positive vasodilator challenge.   No further endoscopic workup with chronic respiratory failure with hypoxia/pulm hypertension and currently try to maximize medications with cardiology/pulmonary. 02/21/2023 Dr. Marchelle Gearing started on Nintedanib/Otev protocol 100mg  once daily but this was stopped due to diarrhea, discontinued 03/11/2023 Dr. Shirlee Latch office visit stopped Actos started Farxiga 10 mg daily, stopped HCTZ and start Lasix 20 mg daily.  She has SOB with exertion, now on oxygen continuous and states her breathing is worse.  She states since stopping the otev 03/11/23 her diarrhea has gotten better. She feels she has incomplete Bm's, once daily, mushy, no form to it.  She was on nexium 3 x a week, this was increased to once a day 40 mg and pepcid 40 mg at night.  States increasing nexium to once a day and pepcid at night may have helped.  Husband is here with her, states the swallowing has improved and she no longer has it except for potassium pills which are new.   States has more issues with upper esophagus and increase mucus, not lower, no issues with liquids. Has improved.  She denies nausea, vomiting.  No melena, no hematochezia.  She is only eating once a day, late afternoon around 4-5 pm.  She states she can not tolerate boost/ensure.   Wt Readings from Last 5 Encounters:  04/02/23 137 lb (62.1 kg)  03/11/23 147 lb (66.7 kg)  02/26/23 152 lb (68.9 kg)  02/21/23 151 lb 6.4 oz (68.7 kg)  02/14/23 149 lb (67.6 kg)    .   She  reports that she has never smoked. She has never used smokeless tobacco. She reports that she does not currently use alcohol. She reports that she does not use drugs.  RELEVANT LABS AND IMAGING: CBC    Component  Value Date/Time   WBC 8.2 02/12/2023 0728   WBC 8.0 11/07/2022 1125   RBC 3.43 (L) 02/12/2023 0728   RBC 3.51 (L) 11/07/2022 1125   HGB 10.9 (L) 02/14/2023 0940   HGB 11.6 02/12/2023 0728   HCT 32.0 (L) 02/14/2023 0940   HCT 34.8 02/12/2023 0728   PLT 210 02/12/2023 0728   MCV 102 (H) 02/12/2023 0728   MCH 33.8 (H) 02/12/2023 0728   MCH 27.4 07/23/2018 0341   MCHC 33.3 02/12/2023 0728   MCHC 32.8 11/07/2022 1125   RDW 15.0 02/12/2023 0728   LYMPHSABS 1.0 11/07/2022 1125   MONOABS 0.1 11/07/2022 1125   EOSABS 0.5 11/07/2022 1125   BASOSABS 0.0 11/07/2022 1125   Recent Labs    11/07/22 1125 02/12/23 0728 02/14/23 0940  HGB 12.0 11.6 10.9*     CMP     Component Value Date/Time   NA 133 (L) 03/21/2023 0837   NA 132 (L) 02/12/2023 0728   K 3.4 (L) 03/21/2023 0837   CL 96 (L) 03/21/2023 0837   CO2 22 03/21/2023 0837   GLUCOSE 87 03/21/2023 0837   BUN 29 (H) 03/21/2023 0837   BUN 18 02/12/2023 0728   CREATININE 1.06 (H) 03/21/2023 0837   CALCIUM 8.7 (L) 03/21/2023 0837   PROT 6.5 08/15/2022 1636   PROT 7.1 04/18/2020 0735   ALBUMIN 3.8 08/15/2022 1636   ALBUMIN 4.3 04/18/2020 0735   AST 30 08/15/2022 1636   ALT 16 08/15/2022 1636   ALKPHOS 81 08/15/2022 1636   BILITOT 1.0 08/15/2022 1636   BILITOT  0.5 04/18/2020 0735   GFRNONAA 55 (L) 03/21/2023 0837   GFRAA >60 07/23/2018 0341      Latest Ref Rng & Units 08/15/2022    4:36 PM 04/18/2020    7:35 AM 01/07/2020    8:45 AM  Hepatic Function  Total Protein 6.5 - 8.1 g/dL 6.5  7.1  6.7   Albumin 3.5 - 5.0 g/dL 3.8  4.3  4.0   AST 15 - 41 U/L 30  60  56   ALT 0 - 44 U/L 16  45  54   Alk Phosphatase 38 - 126 U/L 81  103  114   Total Bilirubin 0.3 - 1.2 mg/dL 1.0  0.5  0.6   Bilirubin, Direct 0.0 - 0.2 mg/dL 0.2  1.61  0.96       Current Medications:   Current Outpatient Medications (Endocrine & Metabolic):    dapagliflozin propanediol (FARXIGA) 10 MG TABS tablet, Take 1 tablet (10 mg total) by mouth daily  before breakfast.  Current Outpatient Medications (Cardiovascular):    amLODipine (NORVASC) 2.5 MG tablet, TAKE 1 TABLET BY MOUTH ONCE DAILY   furosemide (LASIX) 20 MG tablet, Take 1 tablet (20 mg total) by mouth every other day.   rosuvastatin (CRESTOR) 5 MG tablet, Take 1 tablet (5 mg total) by mouth daily. Start with one tablet every Monday, Wednesday, and Friday. Increase to one tablet daily if tolerating well.  Current Outpatient Medications (Respiratory):    diphenhydrAMINE (BENADRYL) 25 MG tablet, Take 25 mg by mouth at bedtime as needed for sleep.   Current Outpatient Medications (Analgesics):    acetaminophen (TYLENOL) 650 MG CR tablet, Take 650 mg by mouth every 8 (eight) hours as needed for pain.   allopurinol (ZYLOPRIM) 300 MG tablet, Take 300 mg by mouth daily.   ASA-APAP-Caff Buffered (VANQUISH PO), Take 2 tablets by mouth 2 (two) times a week.   etanercept (ENBREL) 50 MG/ML injection, Inject 25 mg into the skin 2 (two) times a week.  Current Outpatient Medications (Hematological):    Ferrous Gluconate 324 (37.5 Fe) MG TABS, Take 1 tablet by mouth 2 (two) times daily.   folic acid (FOLVITE) 800 MCG tablet, Take 800 mcg by mouth daily.   vitamin B-12 (CYANOCOBALAMIN) 1000 MCG tablet, Take 1,000 mcg by mouth daily.  Current Outpatient Medications (Other):    B-D TB SYRINGE 1CC/27GX1/2" 27G X 1/2" 1 ML MISC, USE WEEKLY WITH METHOTREXATE SUBCUTANEOUSLY 90 DAYS   Baclofen 5 MG TABS, Take 2.5-5 mg by mouth at bedtime as needed (muscle spasms).   esomeprazole (NEXIUM) 40 MG capsule, Take 1 capsule (40 mg total) by mouth daily before breakfast.   ketoconazole (NIZORAL) 2 % cream, Apply 1 Application topically daily as needed for irritation.   Melatonin 3 MG TABS, Take 3-6 mg by mouth at bedtime as needed (sleep).    methotrexate 50 MG/2ML injection, 25 mg once a week. 0.5 ml every Friday   oxybutynin (DITROPAN) 5 MG tablet, Take 5 mg by mouth 2 (two) times daily.   potassium  chloride SA (KLOR-CON M20) 20 MEQ tablet, Take 1 tablet (20 mEq total) by mouth daily.   vitamin C (ASCORBIC ACID) 500 MG tablet, Take 500 mg by mouth daily.  Medical History:  Past Medical History:  Diagnosis Date   Ambulates with cane    due to gout    Anemia    Arthritis    hands   Arthritis    Cancer, uterine (HCC)    Chest pain,  unspecified 07-01-2018  per pt had 2 day chest pain and sob  6 weeks ago,, per pt no symptoms since   cardiology-- dr Consuello Bossier--  scheduled for stress test and echo 07-03-2018 prior to surgery scheduled 07-08-2018   Diarrhea, unspecified    intermittant   DOE (dyspnea on exertion)    Endometrial cancer (HCC)    Essential hypertension    Frequency of urination    GERD (gastroesophageal reflux disease)    Gout rheumologist-- michelle young PA ([place] [facility])   07-01-2018 per pt flare up april 2019 , hands and wrists   Heart murmur    History of adenomatous polyp of colon    History of esophageal dilatation    for stricture   Mild nausea    per pt intermittant , no vomiting   Mixed dyslipidemia    Muscle weakness of extremity    bilateral upper and lower extremities due to gout   Poor appetite    per pt    Type 2 diabetes mellitus (HCC)    followed by pcp   Urgency of urination    Urine frequency    Allergies:  Allergies  Allergen Reactions   Lovastatin    Rosuvastatin Other (See Comments)     Surgical History:  She  has a past surgical history that includes Mouth surgery (11/2017); Cataract extraction w/ intraocular lens  implant, bilateral (2014;  2017); Tonsillectomy (child); Breast cyst excision (Right, 1974); Robotic assisted total hysterectomy with bilateral salpingo oophorectomy (Bilateral, 07/22/2018); Video bronchoscopy (N/A, 06/27/2021); Bronchial washings (06/27/2021); and RIGHT HEART CATH (N/A, 02/14/2023). Family History:  Her family history includes Clotting disorder in her father; Diabetes in her maternal grandfather;  Heart disease in her father and mother; Heart failure in her mother; Prostate cancer in her father.  REVIEW OF SYSTEMS  : All other systems reviewed and negative except where noted in the History of Present Illness.  PHYSICAL EXAM: BP (!) 140/80   Pulse (!) 115   Ht 4\' 10"  (1.473 m)   Wt 137 lb (62.1 kg)   BMI 28.63 kg/m  General Appearance: Obese, chronically ill appearing, in no apparent distress. Head:   Normocephalic and atraumatic. Eyes:  sclerae anicteric,conjunctive pink, bluish lips. Respiratory: Respiratory effort nincreased, 3 L [place], SOB with talking, diffuse crackles. Cardio: RRR with slight murmur. Peripheral pulses intact.  Abdomen: Soft,  Obese ,active bowel sounds. No tenderness . Without guarding and Without rebound. No masses. Rectal: Not evaluated Musculoskeletal: Full ROM, Antalgic and Ambulates with walker gait. With edema. Skin:  Dry and intact without significant lesions or rashes Neuro: Alert and  oriented x4;  No focal deficits. Psych:  Cooperative. Normal mood and affect.    Doree Albee, PA-C 10:15 AM

## 2023-04-02 ENCOUNTER — Ambulatory Visit: Payer: Medicare Other | Admitting: Physician Assistant

## 2023-04-02 ENCOUNTER — Encounter: Payer: Self-pay | Admitting: Physician Assistant

## 2023-04-02 VITALS — BP 140/80 | HR 115 | Ht <= 58 in | Wt 137.0 lb

## 2023-04-02 DIAGNOSIS — K449 Diaphragmatic hernia without obstruction or gangrene: Secondary | ICD-10-CM

## 2023-04-02 DIAGNOSIS — J849 Interstitial pulmonary disease, unspecified: Secondary | ICD-10-CM

## 2023-04-02 DIAGNOSIS — R195 Other fecal abnormalities: Secondary | ICD-10-CM

## 2023-04-02 DIAGNOSIS — J9611 Chronic respiratory failure with hypoxia: Secondary | ICD-10-CM

## 2023-04-02 DIAGNOSIS — K219 Gastro-esophageal reflux disease without esophagitis: Secondary | ICD-10-CM

## 2023-04-02 DIAGNOSIS — R1314 Dysphagia, pharyngoesophageal phase: Secondary | ICD-10-CM

## 2023-04-02 NOTE — Patient Instructions (Addendum)
Please call to make a 6 month follow up with Madison Holden   Add on benefiber or citracel, these do not cause gas and can help with your bowel movement.  - Raising your feet with a step stool/squatty potty can be helpful to improve the angle that allows your stool to pass through the rectum.  - Drink at least 64-80 ounces of water/liquid per day. - Establish a time to try to move your bowels every day.  For many people, this is after a cup of coffee or after a meal such as breakfast. - Sit all of the way back on the toilet keeping your back fairly straight and while sitting up, try to rest the tops of your forearms on your upper thighs.   - Relax the rectum feeling it bulge toward the toilet water.  If you feel your rectum raising toward your body, you are contracting rather than relaxing. - Breathe in and slowly exhale. "Belly breath" by expanding your belly towards your belly button. Keep belly expanded as you gently direct pressure down and back to the anus.  A low pitched GRRR sound can assist with increasing intra-abdominal pressure.  (Can also trying to blow on a pinwheel and make it move, this helps with the same belly breathing) - Repeat 3-4 times. If unsuccessful, contract the pelvic floor to restore normal tone and get off the toilet.  Avoid excessive straining. - To reduce excessive wiping by teaching your anus to normally contract, place hands on outer aspect of knees and resist knee movement outward.  Hold 5-10 second then place hands just inside of knees and resist inward movement of knees.  Hold 5 seconds.  Repeat a few times each way.  Go to the ER if unable to pass gas, severe AB pain, unable to hold down food, any shortness of breath of chest pain.  Behavioral and Dietary Strategies for Management of Esophageal Dysmotility/dysphagia 1. Take reflux medications 30+ minutes before food in the morning.  2. Begin meals with warm beverage 3. Eat smaller more frequent meals 4. Eat  slowly, taking small bites and sips 5. Alternate solids and liquids 6. Avoid foods/liquids that increase acid production 7. Sit upright during and for 30+ minutes after meals to facilitate esophageal clearing 8. Can try altoid melting in mouth before food  If you continue to have swallowing issues, call and we will get the barium swallow Can also increase the nexium to twice a day.    _______________________________________________________  If your blood pressure at your visit was 140/90 or greater, please contact your primary care physician to follow up on this.  _______________________________________________________  If you are age 85 or older, your body mass index should be between 23-30. Your Body mass index is 28.63 kg/m. If this is out of the aforementioned range listed, please consider follow up with your Primary Care Provider.  If you are age 18 or younger, your body mass index should be between 19-25. Your Body mass index is 28.63 kg/m. If this is out of the aformentioned range listed, please consider follow up with your Primary Care Provider.   ________________________________________________________  The [facility] providers would like to encourage you to use [facility] to communicate with providers for non-urgent requests or questions.  Due to long hold times on the telephone, sending your provider a message by [facility] may be a faster and more efficient way to get a response.  Please allow 48 business hours for a response.  Please remember that  this is for non-urgent requests.  _______________________________________________________ It was a pleasure to see you today!  Thank you for trusting me with your gastrointestinal care!

## 2023-04-03 ENCOUNTER — Ambulatory Visit (HOSPITAL_COMMUNITY)
Admission: RE | Admit: 2023-04-03 | Discharge: 2023-04-03 | Disposition: A | Discharge status: Home / Self Care | Payer: Medicare Other | Source: Ambulatory Visit | Attending: Internal Medicine | Admitting: Internal Medicine

## 2023-04-03 DIAGNOSIS — I272 Pulmonary hypertension, unspecified: Secondary | ICD-10-CM | POA: Diagnosis not present

## 2023-04-03 LAB — BASIC METABOLIC PANEL
Anion gap: 13 (ref 5–15)
BUN: 33 mg/dL — ABNORMAL HIGH (ref 8–23)
CO2: 17 mmol/L — ABNORMAL LOW (ref 22–32)
Calcium: 8.7 mg/dL — ABNORMAL LOW (ref 8.9–10.3)
Chloride: 103 mmol/L (ref 98–111)
Creatinine, Ser: 1.09 mg/dL — ABNORMAL HIGH (ref 0.44–1.00)
GFR, Estimated: 53 mL/min — ABNORMAL LOW (ref >60)
Glucose, Bld: 94 mg/dL (ref 70–99)
Potassium: 4.2 mmol/L (ref 3.5–5.1)
Sodium: 133 mmol/L — ABNORMAL LOW (ref 135–145)

## 2023-04-04 ENCOUNTER — Other Ambulatory Visit (HOSPITAL_COMMUNITY): Payer: Self-pay | Admitting: Cardiology

## 2023-04-04 MED ORDER — POTASSIUM CHLORIDE CRYS ER 10 MEQ PO TBCR: 20.0000 meq | EXTENDED_RELEASE_TABLET | Freq: Every day | ORAL | 5 refills | Status: DC

## 2023-04-04 NOTE — Progress Notes (Signed)
Addendum: Reviewed and agree with assessment and management plan. Jodelle Fausto M, MD  

## 2023-04-05 ENCOUNTER — Encounter (INDEPENDENT_AMBULATORY_CARE_PROVIDER_SITE_OTHER): Payer: Medicare Other | Admitting: Ophthalmology

## 2023-04-05 DIAGNOSIS — I10 Essential (primary) hypertension: Secondary | ICD-10-CM

## 2023-04-05 DIAGNOSIS — D3132 Benign neoplasm of left choroid: Secondary | ICD-10-CM

## 2023-04-05 DIAGNOSIS — H43813 Vitreous degeneration, bilateral: Secondary | ICD-10-CM

## 2023-04-05 DIAGNOSIS — Z7984 Long term (current) use of oral hypoglycemic drugs: Secondary | ICD-10-CM

## 2023-04-05 DIAGNOSIS — H35033 Hypertensive retinopathy, bilateral: Secondary | ICD-10-CM | POA: Diagnosis not present

## 2023-04-05 DIAGNOSIS — E113393 Type 2 diabetes mellitus with moderate nonproliferative diabetic retinopathy without macular edema, bilateral: Secondary | ICD-10-CM | POA: Diagnosis not present

## 2023-04-05 NOTE — Telephone Encounter (Signed)
Advanced Heart Failure Patient Advocate Encounter  Called [facility] to check the status of the patient's referral. Representative stated that they have been trying to get in contact with the patient regarding co-pay assistance for Tyvaso. She has not called back.  I called and left both her and her husband a message to return my call. We can assess eligibility for a grant at that time.

## 2023-04-10 ENCOUNTER — Telehealth (HOSPITAL_COMMUNITY): Payer: Self-pay | Admitting: Pharmacy Technician

## 2023-04-10 ENCOUNTER — Telehealth (HOSPITAL_COMMUNITY): Payer: Self-pay | Admitting: Pharmacist

## 2023-04-10 NOTE — Telephone Encounter (Signed)
Advanced Heart Failure Patient Advocate Encounter  Spoke with patient's husband. They would like more information on the medication before proceeding. They are aware the office will reach out to discuss. Will follow up with them if the patient decides to proceed with Tyvaso

## 2023-04-10 NOTE — Telephone Encounter (Signed)
Advanced Heart Failure Patient Advocate Encounter  The patient was approved for a [employer] grant that will help cover the cost of Tyvaso. Total amount awarded, $10,000. Eligibility, 03/11/23 - 03/09/24.  ID [id number]  BIN [id number]  PCN [id number]  Group [id number]  Relayed grant information to [facility]. Called and spoke with the patient's husband who is point of contact.  Archer Asa, CPhT

## 2023-04-10 NOTE — Telephone Encounter (Signed)
Was informed by clinic patient advocate that patient and her husband had questions regarding Tyvaso. They did not remember her being started on the medication in May.  Called patient and spoke to her husband and provided counseling on PH and Tyvaso and answered all their questions. They expressed understanding. They will call the clinic back after they are able to find household income information to get grant to help cover copay of Tyvaso. Once grant is obtained, can provide to [facility] so medication can be dispensed. They have follow up with Dr. Shirlee Latch next week.   Karle Plumber, PharmD, BCPS, BCCP, CPP Heart Failure Clinic Pharmacist [phone]

## 2023-04-11 DIAGNOSIS — M0579 Rheumatoid arthritis with rheumatoid factor of multiple sites without organ or systems involvement: Secondary | ICD-10-CM | POA: Diagnosis not present

## 2023-04-11 DIAGNOSIS — R5382 Chronic fatigue, unspecified: Secondary | ICD-10-CM | POA: Diagnosis not present

## 2023-04-11 DIAGNOSIS — M1A09X0 Idiopathic chronic gout, multiple sites, without tophus (tophi): Secondary | ICD-10-CM | POA: Diagnosis not present

## 2023-04-11 DIAGNOSIS — M542 Cervicalgia: Secondary | ICD-10-CM | POA: Diagnosis not present

## 2023-04-17 ENCOUNTER — Other Ambulatory Visit (HOSPITAL_COMMUNITY): Payer: Medicare Other

## 2023-04-18 ENCOUNTER — Encounter (HOSPITAL_COMMUNITY): Payer: Self-pay | Admitting: Cardiology

## 2023-04-18 ENCOUNTER — Ambulatory Visit (HOSPITAL_COMMUNITY)
Admission: RE | Admit: 2023-04-18 | Discharge: 2023-04-18 | Disposition: A | Discharge status: Home / Self Care | Payer: Medicare Other | Source: Ambulatory Visit | Attending: Cardiology | Admitting: Cardiology

## 2023-04-18 VITALS — BP 110/70 | HR 105 | Wt 124.8 lb

## 2023-04-18 DIAGNOSIS — I2510 Atherosclerotic heart disease of native coronary artery without angina pectoris: Secondary | ICD-10-CM | POA: Diagnosis not present

## 2023-04-18 DIAGNOSIS — R000 Tachycardia, unspecified: Secondary | ICD-10-CM | POA: Insufficient documentation

## 2023-04-18 DIAGNOSIS — M069 Rheumatoid arthritis, unspecified: Secondary | ICD-10-CM | POA: Insufficient documentation

## 2023-04-18 DIAGNOSIS — I10 Essential (primary) hypertension: Secondary | ICD-10-CM | POA: Diagnosis not present

## 2023-04-18 DIAGNOSIS — J8410 Pulmonary fibrosis, unspecified: Secondary | ICD-10-CM | POA: Insufficient documentation

## 2023-04-18 DIAGNOSIS — J849 Interstitial pulmonary disease, unspecified: Secondary | ICD-10-CM | POA: Diagnosis not present

## 2023-04-18 DIAGNOSIS — Z79899 Other long term (current) drug therapy: Secondary | ICD-10-CM | POA: Diagnosis not present

## 2023-04-18 DIAGNOSIS — I272 Pulmonary hypertension, unspecified: Secondary | ICD-10-CM | POA: Diagnosis not present

## 2023-04-18 DIAGNOSIS — I2721 Secondary pulmonary arterial hypertension: Secondary | ICD-10-CM | POA: Insufficient documentation

## 2023-04-18 LAB — CBC
HCT: 36.3 % (ref 36.0–46.0)
Hemoglobin: 11.5 g/dL — ABNORMAL LOW (ref 12.0–15.0)
MCH: 32.5 pg (ref 26.0–34.0)
MCHC: 31.7 g/dL (ref 30.0–36.0)
MCV: 102.5 fL — ABNORMAL HIGH (ref 80.0–100.0)
Platelets: 220 10*3/uL (ref 150–400)
RBC: 3.54 MIL/uL — ABNORMAL LOW (ref 3.87–5.11)
RDW: 17.5 % — ABNORMAL HIGH (ref 11.5–15.5)
WBC: 4.3 10*3/uL (ref 4.0–10.5)
nRBC: 1.2 % — ABNORMAL HIGH (ref 0.0–0.2)

## 2023-04-18 LAB — COMPREHENSIVE METABOLIC PANEL
ALT: 18 U/L (ref 0–44)
AST: 28 U/L (ref 15–41)
Albumin: 3.6 g/dL (ref 3.5–5.0)
Alkaline Phosphatase: 84 U/L (ref 38–126)
Anion gap: 12 (ref 5–15)
BUN: 58 mg/dL — ABNORMAL HIGH (ref 8–23)
CO2: 18 mmol/L — ABNORMAL LOW (ref 22–32)
Calcium: 9.2 mg/dL (ref 8.9–10.3)
Chloride: 107 mmol/L (ref 98–111)
Creatinine, Ser: 1.24 mg/dL — ABNORMAL HIGH (ref 0.44–1.00)
GFR, Estimated: 46 mL/min — ABNORMAL LOW (ref >60)
Glucose, Bld: 106 mg/dL — ABNORMAL HIGH (ref 70–99)
Potassium: 4.4 mmol/L (ref 3.5–5.1)
Sodium: 137 mmol/L (ref 135–145)
Total Bilirubin: 2.1 mg/dL — ABNORMAL HIGH (ref 0.3–1.2)
Total Protein: 6.5 g/dL (ref 6.5–8.1)

## 2023-04-18 LAB — BRAIN NATRIURETIC PEPTIDE: B Natriuretic Peptide: 1470.6 pg/mL — ABNORMAL HIGH (ref 0.0–100.0)

## 2023-04-18 MED ORDER — POTASSIUM CHLORIDE CRYS ER 10 MEQ PO TBCR: 20.0000 meq | EXTENDED_RELEASE_TABLET | ORAL | 5 refills | Status: DC | PRN

## 2023-04-18 MED ORDER — FUROSEMIDE 20 MG PO TABS: 20.0000 mg | ORAL_TABLET | ORAL | 3 refills | Status: DC | PRN

## 2023-04-18 NOTE — Progress Notes (Signed)
PCP: Henrine Screws, MD Cardiology: Dr. Tenny Craw HF Cardiology: Dr. Shirlee Latch  77 y.o. with history of rheumatoid arthritis, pulmonary fibrosis, pulmonary hypertension/RV failure was referred by Dr. Tenny Craw for evaluation of pulmonary hypertension. Patient has had RA since 2020.  She is on MTX and Enbrel.  She has also been diagnosed with pulmonary fibrosis, likely due to RA.  She wears 3L home oxygen.  She has seen Dr. Marchelle Gearing and Durel Salts was started but she did not tolerate it.  Echo in 3/24 showed EF 60-65%, moderate LVH, D-shaped interventricular septum, moderate RV enlargement with normal systolic function, PASP 43 mmHg.  RHC showed severe PAH, vasodilator challenge was done. At initial appointment, she was significantly volume overloaded and Lasix and Marcelline Deist were started.  I arranged for her to get Tyvaso.   She returns today for followup of pulmonary HTN/RV failure.  She has lost 23 lbs.  She stopped Lasix, says she "cannot take it" due to incontinence.  She says that Marcelline Deist makes her dizzy but she is able to take it in the evening.   She has not started the Tyvaso yet.  Patient is very limited at baseline. She came in a wheelchair today.  She is short of breath walking short distances in her house. She gets very fatigued and short of breath with this. No PND.  She does not feel that extensive diuresis has helped her breathing any. No appetite.   6 minute walk (5/24): 9 m  Labs (4/24): LDL 28, K 4.4, creatinine 6.21 Labs (5/24): BNP 959 Labs (6/24): K 4.2, creatinine 1.09  ECG (personally reviewed): Sinus tachy 106, RVH, inferolateral TWIs  PMH: 1. HTN 2. Type 2 diabetes 3. Hyperlipidemia 4. Pulmonary fibrosis: Suspect that this is rheumatoid arthritis-related.  - HRCT chest (4/24): interstitial lung disease.  - On 3L home oxygen 5. Uterine cancer: s/p TAH/BSO.  6. Rheumatoid arthritis: Diagnosed 2020.  On Enbrel and MTX.  7. Gout 8. RV failure/pulmonary hypertension:  - Echo (3/24): EF  60-65%, moderate LVH, D-shaped interventricular septum, moderate RV enlargement with normal systolic function, PASP 43 mmHg.  - RHC (4/24): mean RA 6, PA 92/31 mean 56, mean PCWP 6, CO 5.5 F with PVR 9.45, CO 4 T with PVR 12.4 WU. Adenosine challenge done, mean PAP decreased to 46-47.  9. CAD: Coronary artery calcium noted on CTs.  - Cardiolite (2019): normal study.  10. Lower extremity venous dopplers (1/24): No DVT.   Social History   Socioeconomic History   Marital status: Married    Spouse name: Not on file   Number of children: 0   Years of education: Not on file   Highest education level: Not on file  Occupational History   Occupation: retired   Occupation: retired  Tobacco Use   Smoking status: Never   Smokeless tobacco: Never  Vaping Use   Vaping Use: Never used  Substance and Sexual Activity   Alcohol use: Not Currently    Comment: 2-3 per  week, not currently drinking since Apri; 2019   Drug use: No   Sexual activity: Not on file  Other Topics Concern   Not on file  Social History Narrative   Has never smoked. Daily Caffeine Use- 2 cups per day. Does not get regular exercise. Retired.    Social Determinants of Health   Financial Resource Strain: Not on file  Food Insecurity: Not on file  Transportation Needs: Not on file  Physical Activity: Not on file  Stress: Not on file  Social Connections: Not on file  Intimate Partner Violence: Not on file   Family History  Problem Relation Age of Onset   Heart disease Mother    Heart failure Mother    Heart disease Father    Prostate cancer Father    Clotting disorder Father    Diabetes Maternal Grandfather    Colon cancer Neg Hx    Liver disease Neg Hx    Esophageal cancer Neg Hx    Cancer - Colon Neg Hx    ROS: All systems reviewed and negative except as per HPI.   Current Outpatient Medications  Medication Sig Dispense Refill   acetaminophen (TYLENOL) 650 MG CR tablet Take 650 mg by mouth every 8 (eight)  hours as needed for pain.     allopurinol (ZYLOPRIM) 300 MG tablet Take 300 mg by mouth daily.     amLODipine (NORVASC) 2.5 MG tablet TAKE 1 TABLET BY MOUTH ONCE DAILY 90 tablet 3   ASA-APAP-Caff Buffered (VANQUISH PO) Take 2 tablets by mouth 2 (two) times a week.     B-D TB SYRINGE 1CC/27GX1/2" 27G X 1/2" 1 ML MISC USE WEEKLY WITH METHOTREXATE SUBCUTANEOUSLY 90 DAYS     Baclofen 5 MG TABS Take 2.5-5 mg by mouth at bedtime as needed (muscle spasms).     dapagliflozin propanediol (FARXIGA) 10 MG TABS tablet Take 1 tablet (10 mg total) by mouth daily before breakfast. 30 tablet 11   diphenhydrAMINE (BENADRYL) 25 MG tablet Take 25 mg by mouth at bedtime as needed for sleep.      esomeprazole (NEXIUM) 40 MG capsule Take 1 capsule (40 mg total) by mouth daily before breakfast. 90 capsule 3   etanercept (ENBREL) 50 MG/ML injection Inject 25 mg into the skin 2 (two) times a week.     Ferrous Gluconate 324 (37.5 Fe) MG TABS Take 1 tablet by mouth 2 (two) times daily.     folic acid (FOLVITE) 800 MCG tablet Take 800 mcg by mouth daily.     ketoconazole (NIZORAL) 2 % cream Apply 1 Application topically daily as needed for irritation.     Melatonin 3 MG TABS Take 3-6 mg by mouth at bedtime as needed (sleep).      methotrexate 50 MG/2ML injection 25 mg once a week. 0.5 ml every Friday     oxybutynin (DITROPAN) 5 MG tablet Take 5 mg by mouth 2 (two) times daily.     rosuvastatin (CRESTOR) 5 MG tablet Take 1 tablet (5 mg total) by mouth daily. Start with one tablet every Monday, Wednesday, and Friday. Increase to one tablet daily if tolerating well. 90 tablet 3   vitamin B-12 (CYANOCOBALAMIN) 1000 MCG tablet Take 1,000 mcg by mouth daily.     vitamin C (ASCORBIC ACID) 500 MG tablet Take 500 mg by mouth daily.     furosemide (LASIX) 20 MG tablet Take 1 tablet (20 mg total) by mouth as needed. Shortness of breath, lower leg swelling, gained more than 3lb in 24hr or 5lb in a week 90 tablet 3   potassium chloride  (KLOR-CON M) 10 MEQ tablet Take 2 tablets (20 mEq total) by mouth as needed. 60 tablet 5   No current facility-administered medications for this encounter.   BP 110/70   Pulse (!) 105   Wt 56.6 kg (124 lb 12.8 oz)   SpO2 95%   BMI 26.08 kg/m  General: NAD, frail Neck: No JVD, no thyromegaly or thyroid nodule.  Lungs: Dry crackles CV: Nondisplaced PMI.  Heart regular S1/S2, no S3/S4, no murmur.  No peripheral edema.  No carotid bruit.  Normal pedal pulses.  Abdomen: Soft, nontender, no hepatosplenomegaly, no distention.  Skin: Intact without lesions or rashes.  Neurologic: Alert and oriented x 3.  Psych: Normal affect. Extremities: No clubbing or cyanosis.  HEENT: Normal.   Assessment/Plan: 1. Interstitial lung disease: Pulmonary fibrosis, had HRCT in 4/24. Possibly related to rheumatoid arthritis.  Followed by Dr. Marchelle Gearing, she did not tolerate Ofev.  - Continue pulmonary followup.  2. Rheumatoid arthritis: On Enbrel and MTX.  3. HTN: BP is controlled.  4. Pulmonary hypertension/RV failure: Echo in 3/24 with EF 60-65%, moderate LVH, D-shaped interventricular septum, moderate RV enlargement with normal systolic function, PASP 43 mmHg. RHC (4/24) showed mean RA 6, PA 92/31 mean 56, mean PCWP 6, CO 5.5 F with PVR 9.45, CO 4 T with PVR 12.4 WU. Adenosine challenge done, mean PAP decreased to 46-47. Pulmonary arterial hypertension.  The adenosine challenge was not positive, mean PAP dropped 9-10 mmHg, but not to a value < 40 mmHg.  Patient also was already on amlodipine (CCB). 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  Volume status looks much improved this visit with weight down significantly but she does not feel any better.  Still NYHA class IIIb-IV symptoms.  She has tolerated Lasix poorly due to incontinence and stopped it.  - She needs to start Tyvaso DPI for PH-ILD.  She will make call today to start it.   -  She will continue Farxiga 10 mg daily but will use Lasix and KCl only prn.  - She needs a V/Q scan to rule out chronic PE (scheduled).  - With her degree of symptoms despite extensive diuresis, I am concerned that she is nearing end stage ILD.  5. CAD: Coronary calcium noted on CTs.  - Continue Crestor 5 mg  Followup with me in 6 wks to see how she is tolerating Tyvaso and to recheck volume.    Marca Ancona 04/18/2023

## 2023-04-18 NOTE — Patient Instructions (Signed)
CALL to get Tyvaso.  CHANGE Lasix and Potassium to as needed.  Labs done today, your results will be available in MyChart, we will contact you for abnormal readings.  Your physician recommends that you schedule a follow-up appointment in: 6 weeks  If you have any questions or concerns before your next appointment please send [place] a message through [place] or call our office at [phone].    TO LEAVE A MESSAGE FOR THE NURSE SELECT OPTION 2, PLEASE LEAVE A MESSAGE INCLUDING: YOUR NAME DATE OF BIRTH CALL BACK NUMBER REASON FOR CALL**this is important as we prioritize the call backs  YOU WILL RECEIVE A CALL BACK THE SAME DAY AS LONG AS YOU CALL BEFORE 4:00 PM  At the [facility], you and your health needs are our priority. As part of our continuing mission to provide you with exceptional heart care, we have created designated Provider Care Teams. These Care Teams include your primary Cardiologist (physician) and Advanced Practice Providers (APPs- Physician Assistants and Nurse Practitioners) who all work together to provide you with the care you need, when you need it.   You may see any of the following providers on your designated Care Team at your next follow up: Dr Arvilla Meres Dr Marca Ancona Dr. Marcos Eke, NP Robbie Lis, [place] [facility] [place], [place] Brynda Peon, NP Karle Plumber, PharmD   Please be sure to bring in all your medications bottles to every appointment.    Thank you for choosing [facility]

## 2023-04-23 ENCOUNTER — Other Ambulatory Visit (HOSPITAL_COMMUNITY): Payer: Self-pay | Admitting: Cardiology

## 2023-04-23 ENCOUNTER — Ambulatory Visit (HOSPITAL_COMMUNITY)
Admission: RE | Admit: 2023-04-23 | Discharge: 2023-04-23 | Disposition: A | Discharge status: Home / Self Care | Payer: Medicare Other | Source: Ambulatory Visit | Attending: Cardiology | Admitting: Cardiology

## 2023-04-23 DIAGNOSIS — J9611 Chronic respiratory failure with hypoxia: Secondary | ICD-10-CM | POA: Insufficient documentation

## 2023-04-23 DIAGNOSIS — M069 Rheumatoid arthritis, unspecified: Secondary | ICD-10-CM | POA: Diagnosis not present

## 2023-04-23 DIAGNOSIS — R6521 Severe sepsis with septic shock: Secondary | ICD-10-CM | POA: Diagnosis not present

## 2023-04-23 DIAGNOSIS — E43 Unspecified severe protein-calorie malnutrition: Secondary | ICD-10-CM | POA: Diagnosis not present

## 2023-04-23 DIAGNOSIS — U071 COVID-19: Secondary | ICD-10-CM | POA: Diagnosis not present

## 2023-04-23 DIAGNOSIS — F32A Depression, unspecified: Secondary | ICD-10-CM | POA: Diagnosis present

## 2023-04-23 DIAGNOSIS — Z66 Do not resuscitate: Secondary | ICD-10-CM | POA: Diagnosis not present

## 2023-04-23 DIAGNOSIS — E11649 Type 2 diabetes mellitus with hypoglycemia without coma: Secondary | ICD-10-CM | POA: Diagnosis not present

## 2023-04-23 DIAGNOSIS — Z7189 Other specified counseling: Secondary | ICD-10-CM | POA: Diagnosis not present

## 2023-04-23 DIAGNOSIS — I272 Pulmonary hypertension, unspecified: Secondary | ICD-10-CM

## 2023-04-23 DIAGNOSIS — B9561 Methicillin susceptible Staphylococcus aureus infection as the cause of diseases classified elsewhere: Secondary | ICD-10-CM | POA: Diagnosis not present

## 2023-04-23 DIAGNOSIS — J1282 Pneumonia due to coronavirus disease 2019: Secondary | ICD-10-CM | POA: Diagnosis not present

## 2023-04-23 DIAGNOSIS — R0689 Other abnormalities of breathing: Secondary | ICD-10-CM | POA: Diagnosis not present

## 2023-04-23 DIAGNOSIS — J189 Pneumonia, unspecified organism: Secondary | ICD-10-CM | POA: Diagnosis not present

## 2023-04-23 DIAGNOSIS — R918 Other nonspecific abnormal finding of lung field: Secondary | ICD-10-CM | POA: Diagnosis not present

## 2023-04-23 DIAGNOSIS — I517 Cardiomegaly: Secondary | ICD-10-CM | POA: Diagnosis not present

## 2023-04-23 DIAGNOSIS — I110 Hypertensive heart disease with heart failure: Secondary | ICD-10-CM | POA: Diagnosis present

## 2023-04-23 DIAGNOSIS — E669 Obesity, unspecified: Secondary | ICD-10-CM | POA: Diagnosis present

## 2023-04-23 DIAGNOSIS — R578 Other shock: Secondary | ICD-10-CM | POA: Diagnosis not present

## 2023-04-23 DIAGNOSIS — J849 Interstitial pulmonary disease, unspecified: Secondary | ICD-10-CM | POA: Diagnosis not present

## 2023-04-23 DIAGNOSIS — G9341 Metabolic encephalopathy: Secondary | ICD-10-CM | POA: Diagnosis not present

## 2023-04-23 DIAGNOSIS — D6959 Other secondary thrombocytopenia: Secondary | ICD-10-CM | POA: Diagnosis present

## 2023-04-23 DIAGNOSIS — R0602 Shortness of breath: Secondary | ICD-10-CM | POA: Diagnosis not present

## 2023-04-23 DIAGNOSIS — J9621 Acute and chronic respiratory failure with hypoxia: Secondary | ICD-10-CM | POA: Diagnosis not present

## 2023-04-23 DIAGNOSIS — R630 Anorexia: Secondary | ICD-10-CM | POA: Diagnosis not present

## 2023-04-23 DIAGNOSIS — R000 Tachycardia, unspecified: Secondary | ICD-10-CM | POA: Diagnosis not present

## 2023-04-23 DIAGNOSIS — J90 Pleural effusion, not elsewhere classified: Secondary | ICD-10-CM | POA: Diagnosis not present

## 2023-04-23 DIAGNOSIS — I2489 Other forms of acute ischemic heart disease: Secondary | ICD-10-CM | POA: Diagnosis present

## 2023-04-23 DIAGNOSIS — R079 Chest pain, unspecified: Secondary | ICD-10-CM | POA: Diagnosis not present

## 2023-04-23 DIAGNOSIS — R404 Transient alteration of awareness: Secondary | ICD-10-CM | POA: Diagnosis not present

## 2023-04-23 DIAGNOSIS — F05 Delirium due to known physiological condition: Secondary | ICD-10-CM | POA: Diagnosis not present

## 2023-04-23 DIAGNOSIS — I5082 Biventricular heart failure: Secondary | ICD-10-CM | POA: Diagnosis not present

## 2023-04-23 DIAGNOSIS — E874 Mixed disorder of acid-base balance: Secondary | ICD-10-CM | POA: Diagnosis present

## 2023-04-23 DIAGNOSIS — A419 Sepsis, unspecified organism: Secondary | ICD-10-CM | POA: Diagnosis not present

## 2023-04-23 DIAGNOSIS — R0989 Other specified symptoms and signs involving the circulatory and respiratory systems: Secondary | ICD-10-CM | POA: Diagnosis not present

## 2023-04-23 DIAGNOSIS — A499 Bacterial infection, unspecified: Secondary | ICD-10-CM | POA: Diagnosis not present

## 2023-04-23 DIAGNOSIS — I5032 Chronic diastolic (congestive) heart failure: Secondary | ICD-10-CM | POA: Diagnosis present

## 2023-04-23 DIAGNOSIS — I2723 Pulmonary hypertension due to lung diseases and hypoxia: Secondary | ICD-10-CM | POA: Diagnosis not present

## 2023-04-23 DIAGNOSIS — J8410 Pulmonary fibrosis, unspecified: Secondary | ICD-10-CM | POA: Diagnosis not present

## 2023-04-23 DIAGNOSIS — I499 Cardiac arrhythmia, unspecified: Secondary | ICD-10-CM | POA: Diagnosis not present

## 2023-04-23 DIAGNOSIS — D849 Immunodeficiency, unspecified: Secondary | ICD-10-CM | POA: Diagnosis not present

## 2023-04-23 DIAGNOSIS — R0902 Hypoxemia: Secondary | ICD-10-CM | POA: Diagnosis not present

## 2023-04-23 DIAGNOSIS — J151 Pneumonia due to Pseudomonas: Secondary | ICD-10-CM | POA: Diagnosis not present

## 2023-04-23 DIAGNOSIS — Z515 Encounter for palliative care: Secondary | ICD-10-CM | POA: Diagnosis not present

## 2023-04-23 DIAGNOSIS — J9622 Acute and chronic respiratory failure with hypercapnia: Secondary | ICD-10-CM | POA: Diagnosis not present

## 2023-04-23 DIAGNOSIS — J9691 Respiratory failure, unspecified with hypoxia: Secondary | ICD-10-CM | POA: Diagnosis not present

## 2023-04-23 DIAGNOSIS — I700 Atherosclerosis of aorta: Secondary | ICD-10-CM | POA: Diagnosis not present

## 2023-04-23 DIAGNOSIS — J15211 Pneumonia due to Methicillin susceptible Staphylococcus aureus: Secondary | ICD-10-CM | POA: Diagnosis not present

## 2023-04-23 DIAGNOSIS — B965 Pseudomonas (aeruginosa) (mallei) (pseudomallei) as the cause of diseases classified elsewhere: Secondary | ICD-10-CM | POA: Diagnosis not present

## 2023-04-23 DIAGNOSIS — A4101 Sepsis due to Methicillin susceptible Staphylococcus aureus: Secondary | ICD-10-CM | POA: Diagnosis not present

## 2023-04-23 DIAGNOSIS — J9601 Acute respiratory failure with hypoxia: Secondary | ICD-10-CM | POA: Diagnosis not present

## 2023-04-23 DIAGNOSIS — I2721 Secondary pulmonary arterial hypertension: Secondary | ICD-10-CM | POA: Diagnosis not present

## 2023-04-23 MED ORDER — TECHNETIUM TO 99M ALBUMIN AGGREGATED
4.0800 | Freq: Once | INTRAVENOUS | Status: AC | PRN
  Administered 2023-04-23: 4.08 via INTRAVENOUS

## 2023-04-26 ENCOUNTER — Inpatient Hospital Stay (HOSPITAL_COMMUNITY)
Admission: EM | Admit: 2023-04-26 | Discharge: 2023-05-23 | DRG: 871 | Disposition: E | Payer: Medicare Other | Attending: Internal Medicine | Admitting: Internal Medicine

## 2023-04-26 ENCOUNTER — Other Ambulatory Visit: Payer: Self-pay

## 2023-04-26 ENCOUNTER — Other Ambulatory Visit (HOSPITAL_COMMUNITY): Payer: Medicare Other

## 2023-04-26 ENCOUNTER — Emergency Department (HOSPITAL_COMMUNITY): Payer: Medicare Other

## 2023-04-26 ENCOUNTER — Encounter (HOSPITAL_COMMUNITY): Payer: Self-pay | Admitting: Emergency Medicine

## 2023-04-26 DIAGNOSIS — A499 Bacterial infection, unspecified: Secondary | ICD-10-CM | POA: Diagnosis not present

## 2023-04-26 DIAGNOSIS — Z515 Encounter for palliative care: Secondary | ICD-10-CM

## 2023-04-26 DIAGNOSIS — E876 Hypokalemia: Secondary | ICD-10-CM | POA: Diagnosis present

## 2023-04-26 DIAGNOSIS — R5381 Other malaise: Secondary | ICD-10-CM | POA: Diagnosis present

## 2023-04-26 DIAGNOSIS — I2721 Secondary pulmonary arterial hypertension: Secondary | ICD-10-CM | POA: Diagnosis not present

## 2023-04-26 DIAGNOSIS — A419 Sepsis, unspecified organism: Secondary | ICD-10-CM | POA: Diagnosis not present

## 2023-04-26 DIAGNOSIS — Z66 Do not resuscitate: Secondary | ICD-10-CM | POA: Diagnosis present

## 2023-04-26 DIAGNOSIS — J1282 Pneumonia due to coronavirus disease 2019: Secondary | ICD-10-CM | POA: Diagnosis present

## 2023-04-26 DIAGNOSIS — F05 Delirium due to known physiological condition: Secondary | ICD-10-CM | POA: Diagnosis not present

## 2023-04-26 DIAGNOSIS — J9622 Acute and chronic respiratory failure with hypercapnia: Secondary | ICD-10-CM | POA: Diagnosis present

## 2023-04-26 DIAGNOSIS — Z8542 Personal history of malignant neoplasm of other parts of uterus: Secondary | ICD-10-CM

## 2023-04-26 DIAGNOSIS — J9621 Acute and chronic respiratory failure with hypoxia: Secondary | ICD-10-CM | POA: Diagnosis present

## 2023-04-26 DIAGNOSIS — A4101 Sepsis due to Methicillin susceptible Staphylococcus aureus: Secondary | ICD-10-CM | POA: Diagnosis present

## 2023-04-26 DIAGNOSIS — J151 Pneumonia due to Pseudomonas: Secondary | ICD-10-CM | POA: Diagnosis not present

## 2023-04-26 DIAGNOSIS — Z9981 Dependence on supplemental oxygen: Secondary | ICD-10-CM

## 2023-04-26 DIAGNOSIS — R451 Restlessness and agitation: Secondary | ICD-10-CM | POA: Diagnosis not present

## 2023-04-26 DIAGNOSIS — E782 Mixed hyperlipidemia: Secondary | ICD-10-CM | POA: Diagnosis present

## 2023-04-26 DIAGNOSIS — Z6826 Body mass index (BMI) 26.0-26.9, adult: Secondary | ICD-10-CM

## 2023-04-26 DIAGNOSIS — B965 Pseudomonas (aeruginosa) (mallei) (pseudomallei) as the cause of diseases classified elsewhere: Secondary | ICD-10-CM | POA: Diagnosis present

## 2023-04-26 DIAGNOSIS — I5082 Biventricular heart failure: Secondary | ICD-10-CM | POA: Diagnosis present

## 2023-04-26 DIAGNOSIS — R6521 Severe sepsis with septic shock: Secondary | ICD-10-CM | POA: Diagnosis present

## 2023-04-26 DIAGNOSIS — E11649 Type 2 diabetes mellitus with hypoglycemia without coma: Secondary | ICD-10-CM | POA: Diagnosis present

## 2023-04-26 DIAGNOSIS — Z86010 Personal history of colonic polyps: Secondary | ICD-10-CM

## 2023-04-26 DIAGNOSIS — U071 COVID-19: Secondary | ICD-10-CM

## 2023-04-26 DIAGNOSIS — R578 Other shock: Secondary | ICD-10-CM | POA: Diagnosis not present

## 2023-04-26 DIAGNOSIS — J849 Interstitial pulmonary disease, unspecified: Secondary | ICD-10-CM | POA: Diagnosis not present

## 2023-04-26 DIAGNOSIS — M19041 Primary osteoarthritis, right hand: Secondary | ICD-10-CM | POA: Diagnosis present

## 2023-04-26 DIAGNOSIS — J15211 Pneumonia due to Methicillin susceptible Staphylococcus aureus: Secondary | ICD-10-CM | POA: Diagnosis present

## 2023-04-26 DIAGNOSIS — I5032 Chronic diastolic (congestive) heart failure: Secondary | ICD-10-CM | POA: Diagnosis present

## 2023-04-26 DIAGNOSIS — Z90722 Acquired absence of ovaries, bilateral: Secondary | ICD-10-CM

## 2023-04-26 DIAGNOSIS — Z79899 Other long term (current) drug therapy: Secondary | ICD-10-CM

## 2023-04-26 DIAGNOSIS — E874 Mixed disorder of acid-base balance: Secondary | ICD-10-CM | POA: Diagnosis present

## 2023-04-26 DIAGNOSIS — R54 Age-related physical debility: Secondary | ICD-10-CM | POA: Diagnosis present

## 2023-04-26 DIAGNOSIS — F32A Depression, unspecified: Secondary | ICD-10-CM | POA: Diagnosis present

## 2023-04-26 DIAGNOSIS — R630 Anorexia: Secondary | ICD-10-CM | POA: Diagnosis not present

## 2023-04-26 DIAGNOSIS — M542 Cervicalgia: Secondary | ICD-10-CM

## 2023-04-26 DIAGNOSIS — J9601 Acute respiratory failure with hypoxia: Secondary | ICD-10-CM

## 2023-04-26 DIAGNOSIS — Z9079 Acquired absence of other genital organ(s): Secondary | ICD-10-CM

## 2023-04-26 DIAGNOSIS — Z833 Family history of diabetes mellitus: Secondary | ICD-10-CM

## 2023-04-26 DIAGNOSIS — D849 Immunodeficiency, unspecified: Secondary | ICD-10-CM | POA: Diagnosis present

## 2023-04-26 DIAGNOSIS — I110 Hypertensive heart disease with heart failure: Secondary | ICD-10-CM | POA: Diagnosis present

## 2023-04-26 DIAGNOSIS — B9561 Methicillin susceptible Staphylococcus aureus infection as the cause of diseases classified elsewhere: Secondary | ICD-10-CM | POA: Diagnosis not present

## 2023-04-26 DIAGNOSIS — I2723 Pulmonary hypertension due to lung diseases and hypoxia: Secondary | ICD-10-CM | POA: Diagnosis present

## 2023-04-26 DIAGNOSIS — E43 Unspecified severe protein-calorie malnutrition: Secondary | ICD-10-CM | POA: Diagnosis present

## 2023-04-26 DIAGNOSIS — K449 Diaphragmatic hernia without obstruction or gangrene: Secondary | ICD-10-CM | POA: Diagnosis present

## 2023-04-26 DIAGNOSIS — G8929 Other chronic pain: Secondary | ICD-10-CM | POA: Diagnosis present

## 2023-04-26 DIAGNOSIS — Z90710 Acquired absence of both cervix and uterus: Secondary | ICD-10-CM

## 2023-04-26 DIAGNOSIS — I2489 Other forms of acute ischemic heart disease: Secondary | ICD-10-CM | POA: Diagnosis present

## 2023-04-26 DIAGNOSIS — E669 Obesity, unspecified: Secondary | ICD-10-CM | POA: Diagnosis present

## 2023-04-26 DIAGNOSIS — M069 Rheumatoid arthritis, unspecified: Secondary | ICD-10-CM | POA: Diagnosis present

## 2023-04-26 DIAGNOSIS — K219 Gastro-esophageal reflux disease without esophagitis: Secondary | ICD-10-CM | POA: Diagnosis present

## 2023-04-26 DIAGNOSIS — D6959 Other secondary thrombocytopenia: Secondary | ICD-10-CM | POA: Diagnosis present

## 2023-04-26 DIAGNOSIS — K5900 Constipation, unspecified: Secondary | ICD-10-CM | POA: Diagnosis not present

## 2023-04-26 DIAGNOSIS — I272 Pulmonary hypertension, unspecified: Secondary | ICD-10-CM | POA: Diagnosis not present

## 2023-04-26 DIAGNOSIS — Z7189 Other specified counseling: Secondary | ICD-10-CM | POA: Diagnosis not present

## 2023-04-26 DIAGNOSIS — G9341 Metabolic encephalopathy: Secondary | ICD-10-CM | POA: Diagnosis present

## 2023-04-26 DIAGNOSIS — M19042 Primary osteoarthritis, left hand: Secondary | ICD-10-CM | POA: Diagnosis present

## 2023-04-26 DIAGNOSIS — M6281 Muscle weakness (generalized): Secondary | ICD-10-CM | POA: Diagnosis present

## 2023-04-26 DIAGNOSIS — J8410 Pulmonary fibrosis, unspecified: Secondary | ICD-10-CM | POA: Diagnosis present

## 2023-04-26 DIAGNOSIS — J9690 Respiratory failure, unspecified, unspecified whether with hypoxia or hypercapnia: Secondary | ICD-10-CM | POA: Diagnosis present

## 2023-04-26 DIAGNOSIS — M109 Gout, unspecified: Secondary | ICD-10-CM | POA: Diagnosis present

## 2023-04-26 DIAGNOSIS — Z961 Presence of intraocular lens: Secondary | ICD-10-CM | POA: Diagnosis present

## 2023-04-26 DIAGNOSIS — N179 Acute kidney failure, unspecified: Secondary | ICD-10-CM | POA: Diagnosis present

## 2023-04-26 DIAGNOSIS — Z8249 Family history of ischemic heart disease and other diseases of the circulatory system: Secondary | ICD-10-CM

## 2023-04-26 LAB — LACTIC ACID, PLASMA
Lactic Acid, Venous: 4.1 mmol/L (ref 0.5–1.9)
Lactic Acid, Venous: 4.4 mmol/L (ref 0.5–1.9)
Lactic Acid, Venous: 2.8 mmol/L (ref 0.5–1.9)

## 2023-04-26 LAB — BRAIN NATRIURETIC PEPTIDE: B Natriuretic Peptide: 839.4 pg/mL — ABNORMAL HIGH (ref 0.0–100.0)

## 2023-04-26 LAB — BLOOD CULTURE ID PANEL (REFLEXED) - BCID2
A.calcoaceticus-baumannii: NOT DETECTED
Bacteroides fragilis: NOT DETECTED
CTX-M ESBL: NOT DETECTED
Candida albicans: NOT DETECTED
Candida auris: NOT DETECTED
Candida glabrata: NOT DETECTED
Candida krusei: NOT DETECTED
Candida parapsilosis: NOT DETECTED
Candida tropicalis: NOT DETECTED
Carbapenem resistance IMP: NOT DETECTED
Carbapenem resistance KPC: NOT DETECTED
Carbapenem resistance NDM: NOT DETECTED
Carbapenem resistance VIM: NOT DETECTED
Cryptococcus neoformans/gattii: NOT DETECTED
Enterobacter cloacae complex: NOT DETECTED
Enterobacterales: NOT DETECTED
Enterococcus Faecium: NOT DETECTED
Enterococcus faecalis: NOT DETECTED
Escherichia coli: NOT DETECTED
Haemophilus influenzae: NOT DETECTED
Klebsiella aerogenes: NOT DETECTED
Klebsiella oxytoca: NOT DETECTED
Klebsiella pneumoniae: NOT DETECTED
Listeria monocytogenes: NOT DETECTED
Meth resistant mecA/C and MREJ: NOT DETECTED
Neisseria meningitidis: NOT DETECTED
Proteus species: NOT DETECTED
Pseudomonas aeruginosa: DETECTED — AB
Salmonella species: NOT DETECTED
Serratia marcescens: NOT DETECTED
Staphylococcus aureus (BCID): DETECTED — AB
Staphylococcus epidermidis: NOT DETECTED
Staphylococcus lugdunensis: NOT DETECTED
Staphylococcus species: DETECTED — AB
Stenotrophomonas maltophilia: NOT DETECTED
Streptococcus agalactiae: NOT DETECTED
Streptococcus pneumoniae: NOT DETECTED
Streptococcus pyogenes: NOT DETECTED
Streptococcus species: NOT DETECTED

## 2023-04-26 LAB — I-STAT VENOUS BLOOD GAS, ED
Acid-base deficit: 9 mmol/L — ABNORMAL HIGH (ref 0.0–2.0)
Bicarbonate: 18.7 mmol/L — ABNORMAL LOW (ref 20.0–28.0)
Calcium, Ion: 1.15 mmol/L (ref 1.15–1.40)
HCT: 41 % (ref 36.0–46.0)
Hemoglobin: 13.9 g/dL (ref 12.0–15.0)
O2 Saturation: 74 %
Potassium: 3.7 mmol/L (ref 3.5–5.1)
Sodium: 143 mmol/L (ref 135–145)
TCO2: 20 mmol/L — ABNORMAL LOW (ref 22–32)
pCO2, Ven: 47.4 mmHg (ref 44–60)
pH, Ven: 7.204 — ABNORMAL LOW (ref 7.25–7.43)
pO2, Ven: 48 mmHg — ABNORMAL HIGH (ref 32–45)

## 2023-04-26 LAB — POCT I-STAT 7, (LYTES, BLD GAS, ICA,H+H)
Acid-base deficit: 5 mmol/L — ABNORMAL HIGH (ref 0.0–2.0)
Bicarbonate: 23.9 mmol/L (ref 20.0–28.0)
Calcium, Ion: 1.26 mmol/L (ref 1.15–1.40)
HCT: 35 % — ABNORMAL LOW (ref 36.0–46.0)
Hemoglobin: 11.9 g/dL — ABNORMAL LOW (ref 12.0–15.0)
O2 Saturation: 99 %
Patient temperature: 97.9
Potassium: 3.7 mmol/L (ref 3.5–5.1)
Sodium: 141 mmol/L (ref 135–145)
TCO2: 26 mmol/L (ref 22–32)
pCO2 arterial: 63.7 mmHg — ABNORMAL HIGH (ref 32–48)
pH, Arterial: 7.179 — CL (ref 7.35–7.45)
pO2, Arterial: 169 mmHg — ABNORMAL HIGH (ref 83–108)

## 2023-04-26 LAB — HEMOGLOBIN A1C
Hgb A1c MFr Bld: 5.4 % (ref 4.8–5.6)
Mean Plasma Glucose: 108.28 mg/dL

## 2023-04-26 LAB — BASIC METABOLIC PANEL
Anion gap: 11 (ref 5–15)
BUN: 39 mg/dL — ABNORMAL HIGH (ref 8–23)
CO2: 23 mmol/L (ref 22–32)
Calcium: 8.3 mg/dL — ABNORMAL LOW (ref 8.9–10.3)
Chloride: 106 mmol/L (ref 98–111)
Creatinine, Ser: 1.39 mg/dL — ABNORMAL HIGH (ref 0.44–1.00)
GFR, Estimated: 40 mL/min — ABNORMAL LOW (ref >60)
Glucose, Bld: 206 mg/dL — ABNORMAL HIGH (ref 70–99)
Potassium: 3.6 mmol/L (ref 3.5–5.1)
Sodium: 140 mmol/L (ref 135–145)

## 2023-04-26 LAB — RESPIRATORY PANEL BY PCR

## 2023-04-26 LAB — URINALYSIS, ROUTINE W REFLEX MICROSCOPIC
Bilirubin Urine: NEGATIVE
Glucose, UA: >=500 mg/dL — AB
Hgb urine dipstick: NEGATIVE
Ketones, ur: NEGATIVE mg/dL
Leukocytes,Ua: NEGATIVE
Nitrite: NEGATIVE
Protein, ur: 30 mg/dL — AB
Specific Gravity, Urine: 1.042 — ABNORMAL HIGH (ref 1.005–1.030)
pH: 5 (ref 5.0–8.0)

## 2023-04-26 LAB — CBC
HCT: 41 % (ref 36.0–46.0)
HCT: 36.9 % (ref 36.0–46.0)
Hemoglobin: 12.5 g/dL (ref 12.0–15.0)
Hemoglobin: 11.2 g/dL — ABNORMAL LOW (ref 12.0–15.0)
MCH: 34.3 pg — ABNORMAL HIGH (ref 26.0–34.0)
MCH: 32.9 pg (ref 26.0–34.0)
MCHC: 30.5 g/dL (ref 30.0–36.0)
MCHC: 30.4 g/dL (ref 30.0–36.0)
MCV: 112.6 fL — ABNORMAL HIGH (ref 80.0–100.0)
MCV: 108.5 fL — ABNORMAL HIGH (ref 80.0–100.0)
Platelets: 158 10*3/uL (ref 150–400)
Platelets: 149 10*3/uL — ABNORMAL LOW (ref 150–400)
RBC: 3.64 MIL/uL — ABNORMAL LOW (ref 3.87–5.11)
RBC: 3.4 MIL/uL — ABNORMAL LOW (ref 3.87–5.11)
RDW: 18.3 % — ABNORMAL HIGH (ref 11.5–15.5)
RDW: 18.6 % — ABNORMAL HIGH (ref 11.5–15.5)
WBC: 21.5 10*3/uL — ABNORMAL HIGH (ref 4.0–10.5)
WBC: 30.7 10*3/uL — ABNORMAL HIGH (ref 4.0–10.5)
nRBC: 1.4 % — ABNORMAL HIGH (ref 0.0–0.2)
nRBC: 0.7 % — ABNORMAL HIGH (ref 0.0–0.2)

## 2023-04-26 LAB — GLUCOSE, CAPILLARY
Glucose-Capillary: 207 mg/dL — ABNORMAL HIGH (ref 70–99)
Glucose-Capillary: 211 mg/dL — ABNORMAL HIGH (ref 70–99)
Glucose-Capillary: 174 mg/dL — ABNORMAL HIGH (ref 70–99)
Glucose-Capillary: 151 mg/dL — ABNORMAL HIGH (ref 70–99)

## 2023-04-26 LAB — COMPREHENSIVE METABOLIC PANEL
ALT: 13 U/L (ref 0–44)
AST: 31 U/L (ref 15–41)
Albumin: 3.3 g/dL — ABNORMAL LOW (ref 3.5–5.0)
Alkaline Phosphatase: 87 U/L (ref 38–126)
Anion gap: 19 — ABNORMAL HIGH (ref 5–15)
BUN: 44 mg/dL — ABNORMAL HIGH (ref 8–23)
CO2: 16 mmol/L — ABNORMAL LOW (ref 22–32)
Calcium: 8.9 mg/dL (ref 8.9–10.3)
Chloride: 107 mmol/L (ref 98–111)
Creatinine, Ser: 1.5 mg/dL — ABNORMAL HIGH (ref 0.44–1.00)
GFR, Estimated: 36 mL/min — ABNORMAL LOW (ref >60)
Glucose, Bld: 70 mg/dL (ref 70–99)
Potassium: 3.9 mmol/L (ref 3.5–5.1)
Sodium: 142 mmol/L (ref 135–145)
Total Bilirubin: 2.8 mg/dL — ABNORMAL HIGH (ref 0.3–1.2)
Total Protein: 6.4 g/dL — ABNORMAL LOW (ref 6.5–8.1)

## 2023-04-26 LAB — PROTIME-INR
INR: 1.3 — ABNORMAL HIGH (ref 0.8–1.2)
Prothrombin Time: 16.6 seconds — ABNORMAL HIGH (ref 11.4–15.2)

## 2023-04-26 LAB — CBG MONITORING, ED
Glucose-Capillary: 79 mg/dL (ref 70–99)
Glucose-Capillary: 169 mg/dL — ABNORMAL HIGH (ref 70–99)

## 2023-04-26 LAB — MRSA NEXT GEN BY PCR, NASAL: MRSA by PCR Next Gen: NOT DETECTED

## 2023-04-26 LAB — MAGNESIUM: Magnesium: 1.4 mg/dL — ABNORMAL LOW (ref 1.7–2.4)

## 2023-04-26 LAB — TSH: TSH: 2.508 u[IU]/mL (ref 0.350–4.500)

## 2023-04-26 LAB — TROPONIN I (HIGH SENSITIVITY)
Troponin I (High Sensitivity): 86 ng/L — ABNORMAL HIGH (ref <18)
Troponin I (High Sensitivity): 105 ng/L (ref <18)

## 2023-04-26 LAB — SARS CORONAVIRUS 2 BY RT PCR: SARS Coronavirus 2 by RT PCR: POSITIVE — AB

## 2023-04-26 LAB — CULTURE, BLOOD (ROUTINE X 2)

## 2023-04-26 LAB — PROCALCITONIN: Procalcitonin: 13.89 ng/mL

## 2023-04-26 MED ORDER — SODIUM CHLORIDE 0.9 % IV SOLN: 1.0000 g | INTRAVENOUS | Status: DC

## 2023-04-26 MED ORDER — POLYETHYLENE GLYCOL 3350 17 G PO PACK: 17.0000 g | PACK | Freq: Every day | ORAL | Status: DC | PRN

## 2023-04-26 MED ORDER — DEXTROSE 50 % IV SOLN
25.0000 g | Freq: Once | INTRAVENOUS | Status: AC
  Administered 2023-04-26: 25 g via INTRAVENOUS
  Filled 2023-04-26: qty 50

## 2023-04-26 MED ORDER — DEXMEDETOMIDINE HCL IN NACL 400 MCG/100ML IV SOLN
0.0000 ug/kg/h | INTRAVENOUS | Status: DC
  Administered 2023-04-26: 0.1 ug/kg/h via INTRAVENOUS
  Administered 2023-04-27 – 2023-04-28 (×3): 0.5 ug/kg/h via INTRAVENOUS
  Filled 2023-04-26 (×4): qty 100

## 2023-04-26 MED ORDER — SODIUM CHLORIDE 0.9 % IV SOLN
500.0000 mg | Freq: Every day | INTRAVENOUS | Status: DC
  Administered 2023-04-26: 500 mg via INTRAVENOUS
  Filled 2023-04-26: qty 5

## 2023-04-26 MED ORDER — METHYLPREDNISOLONE SODIUM SUCC 125 MG IJ SOLR
120.0000 mg | INTRAMUSCULAR | Status: DC
  Administered 2023-04-27 – 2023-05-01 (×6): 120 mg via INTRAVENOUS
  Filled 2023-04-26 (×6): qty 2

## 2023-04-26 MED ORDER — SODIUM CHLORIDE 0.9 % IV SOLN
2.0000 g | Freq: Once | INTRAVENOUS | Status: AC
  Administered 2023-04-26: 2 g via INTRAVENOUS
  Filled 2023-04-26: qty 20

## 2023-04-26 MED ORDER — MAGNESIUM SULFATE 2 GM/50ML IV SOLN
2.0000 g | Freq: Once | INTRAVENOUS | Status: AC
  Administered 2023-04-26: 2 g via INTRAVENOUS
  Filled 2023-04-26: qty 50

## 2023-04-26 MED ORDER — DOCUSATE SODIUM 100 MG PO CAPS: 100.0000 mg | ORAL_CAPSULE | Freq: Two times a day (BID) | ORAL | Status: DC | PRN

## 2023-04-26 MED ORDER — INSULIN ASPART 100 UNIT/ML IJ SOLN
0.0000 [IU] | INTRAMUSCULAR | Status: DC
  Administered 2023-04-26: 3 [IU] via SUBCUTANEOUS
  Administered 2023-04-26: 5 [IU] via SUBCUTANEOUS
  Administered 2023-04-26: 2 [IU] via SUBCUTANEOUS
  Administered 2023-04-26: 3 [IU] via SUBCUTANEOUS
  Administered 2023-04-27 (×2): 2 [IU] via SUBCUTANEOUS
  Administered 2023-04-27: 3 [IU] via SUBCUTANEOUS
  Administered 2023-04-27: 2 [IU] via SUBCUTANEOUS
  Administered 2023-04-27: 3 [IU] via SUBCUTANEOUS
  Administered 2023-04-28: 2 [IU] via SUBCUTANEOUS
  Administered 2023-04-28: 3 [IU] via SUBCUTANEOUS
  Administered 2023-04-28 – 2023-04-30 (×6): 2 [IU] via SUBCUTANEOUS

## 2023-04-26 MED ORDER — ARFORMOTEROL TARTRATE 15 MCG/2ML IN NEBU
15.0000 ug | INHALATION_SOLUTION | Freq: Two times a day (BID) | RESPIRATORY_TRACT | Status: DC
  Administered 2023-04-26 – 2023-05-05 (×19): 15 ug via RESPIRATORY_TRACT
  Filled 2023-04-26 (×19): qty 2

## 2023-04-26 MED ORDER — SODIUM CHLORIDE 0.9 % IV SOLN
250.0000 mL | INTRAVENOUS | Status: DC
  Administered 2023-04-26: 250 mL via INTRAVENOUS

## 2023-04-26 MED ORDER — SODIUM CHLORIDE 0.9 % IV BOLUS: 500.0000 mL | Freq: Once | INTRAVENOUS | Status: DC

## 2023-04-26 MED ORDER — LACTATED RINGERS IV BOLUS
1000.0000 mL | Freq: Once | INTRAVENOUS | Status: AC
  Administered 2023-04-26: 1000 mL via INTRAVENOUS

## 2023-04-26 MED ORDER — HEPARIN SODIUM (PORCINE) 5000 UNIT/ML IJ SOLN
5000.0000 [IU] | Freq: Three times a day (TID) | INTRAMUSCULAR | Status: DC
  Administered 2023-04-26 – 2023-04-27 (×4): 5000 [IU] via SUBCUTANEOUS
  Filled 2023-04-26 (×4): qty 1

## 2023-04-26 MED ORDER — IOHEXOL 350 MG/ML SOLN
60.0000 mL | Freq: Once | INTRAVENOUS | Status: AC | PRN
  Administered 2023-04-26: 60 mL via INTRAVENOUS

## 2023-04-26 MED ORDER — SODIUM CHLORIDE 0.9 % IV BOLUS
500.0000 mL | Freq: Once | INTRAVENOUS | Status: AC
  Administered 2023-04-26: 500 mL via INTRAVENOUS

## 2023-04-26 MED ORDER — LACTATED RINGERS IV SOLN: INTRAVENOUS | Status: DC

## 2023-04-26 MED ORDER — NOREPINEPHRINE 4 MG/250ML-% IV SOLN
0.0000 ug/min | INTRAVENOUS | Status: DC
  Administered 2023-04-26: 2 ug/min via INTRAVENOUS
  Filled 2023-04-26: qty 250

## 2023-04-26 MED ORDER — REVEFENACIN 175 MCG/3ML IN SOLN
175.0000 ug | Freq: Every day | RESPIRATORY_TRACT | Status: DC
  Administered 2023-04-26 – 2023-05-05 (×10): 175 ug via RESPIRATORY_TRACT
  Filled 2023-04-26 (×10): qty 3

## 2023-04-26 MED ORDER — NOREPINEPHRINE 4 MG/250ML-% IV SOLN
2.0000 ug/min | INTRAVENOUS | Status: DC
  Administered 2023-04-26: 10 ug/min via INTRAVENOUS
  Administered 2023-04-26 (×2): 13 ug/min via INTRAVENOUS
  Administered 2023-04-27: 10 ug/min via INTRAVENOUS
  Administered 2023-04-27: 3 ug/min via INTRAVENOUS
  Administered 2023-04-27: 10 ug/min via INTRAVENOUS
  Filled 2023-04-26 (×7): qty 250

## 2023-04-26 MED ORDER — CHLORHEXIDINE GLUCONATE CLOTH 2 % EX PADS
6.0000 | MEDICATED_PAD | Freq: Every day | CUTANEOUS | Status: DC
  Administered 2023-04-26 – 2023-05-05 (×10): 6 via TOPICAL

## 2023-04-26 MED ORDER — SODIUM CHLORIDE 0.9 % IV SOLN
2.0000 g | Freq: Two times a day (BID) | INTRAVENOUS | Status: DC
  Administered 2023-04-26 – 2023-04-29 (×7): 2 g via INTRAVENOUS
  Filled 2023-04-26 (×7): qty 12.5

## 2023-04-26 MED ORDER — LACTATED RINGERS IV BOLUS
250.0000 mL | Freq: Once | INTRAVENOUS | Status: AC
  Administered 2023-04-26: 250 mL via INTRAVENOUS

## 2023-04-26 MED ORDER — BUDESONIDE 0.25 MG/2ML IN SUSP
0.2500 mg | Freq: Two times a day (BID) | RESPIRATORY_TRACT | Status: DC
  Administered 2023-04-26 – 2023-05-05 (×19): 0.25 mg via RESPIRATORY_TRACT
  Filled 2023-04-26 (×19): qty 2

## 2023-04-26 MED ORDER — MORPHINE SULFATE (PF) 2 MG/ML IV SOLN
1.0000 mg | INTRAVENOUS | Status: DC | PRN
  Administered 2023-04-28 – 2023-05-04 (×6): 1 mg via INTRAVENOUS
  Filled 2023-04-26 (×6): qty 1

## 2023-04-26 MED ORDER — METHYLPREDNISOLONE SODIUM SUCC 125 MG IJ SOLR
125.0000 mg | Freq: Once | INTRAMUSCULAR | Status: AC
  Administered 2023-04-26: 125 mg via INTRAVENOUS
  Filled 2023-04-26: qty 2

## 2023-04-26 NOTE — H&P (Addendum)
NAME:  Madison Holden, MRN:  [id number], DOB:  20-May-1946, LOS: 0 ADMISSION DATE:  04/26/2023, CONSULTATION DATE:  04/26/23 REFERRING MD:  EDP, CHIEF COMPLAINT:  hypoxia    History of Present Illness:  Madison Holden is a 77 y.o. F with PMH significant for ILD/pulmonary fibrosis, RA on Enbrel and MTX, PAH and R heart failure who presented to the ED with acutely worsening shortness of breath and hypoxia to 76% on home [place].  This was in the setting of about one week of minimal po intake, husband thinks that this was due to [place] which she resumed 6/23 after stopping it herself due to side effects.   She has been coughing up some phlegm with nasal congestion, but not significant amounts and denies fever, diarrhea or abdominal pain.  She required Bipap in the ED and CXR showed her chronic ILD with scattered airspace opacities, though not significantly changed from prior.   Labs were significant for lactic acid of 4.1, BNP 839, WBC 21.5, troponin 86.   She was hypotensive and required low dose levophed.   PCCM consulted for admission  Pertinent  Medical History   has a past medical history of Ambulates with cane, Anemia, Arthritis, Arthritis, Cancer, uterine (HCC), Chest pain, unspecified (07-01-2018  per pt had 2 day chest pain and sob  6 weeks ago,, per pt no symptoms since), Diarrhea, unspecified, DOE (dyspnea on exertion), Endometrial cancer (HCC), Essential hypertension, Frequency of urination, GERD (gastroesophageal reflux disease), Gout (rheumologist-- michelle young PA ([facility])), Heart murmur, History of adenomatous polyp of colon, History of esophageal dilatation, Mild nausea, Mixed dyslipidemia, Muscle weakness of extremity, Poor appetite, Type 2 diabetes mellitus (HCC), Urgency of urination, and Urine frequency.   Significant Hospital Events: Including procedures, antibiotic start and stop dates in addition to other pertinent events   7/5 admit to PCCM on bipap and levophed   Interim History / Subjective:  As above  Objective   Blood pressure 97/63, pulse (!) 120, temperature 98 F (36.7 C), temperature source Temporal, resp. rate (!) 23, SpO2 100 %.    Vent Mode: BIPAP;PCV FiO2 (%):  [70 %] 70 % Set Rate:  [15 bmp] 15 bmp PEEP:  [6 cmH20] 6 cmH20   Intake/Output Summary (Last 24 hours) at 04/26/2023 0342 Last data filed at 04/26/2023 0230 Gross per 24 hour  Intake 1600 ml  Output --  Net 1600 ml   There were no vitals filed for this visit.  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 Shock, septic vs cardiogenic  Given lactic acidosis, worsening respiratory status and leukocytosis suspect acute respiratory infection along with ILD flare, she appears volume down so do not think she is an acute HF exacerbation -continue bipap, she is currently tolerating well  -check sputum culture, RVP, covid, blood cultures and procal  -received 30cc/kg, trend lactic -echo -CAP coverage with Ceftriaxone and Azithromycin  -solumedrol 125mg  -brovana, yupelri, pulmicort nebs -pt would want to be intubated if necessary -trop 86>105, likely  demand -check UA -continue levophed to maintain MAP >65 -hold farxiga and norvasc -CT chest ordered in ER and is pending AKI Likely secondary to poor po intake and volume depletion -follow BMP after IVF, avoid nephrotoxins and follow UOP  Rheumatoid arthritis  -hold Enbrel and methotrexate in the setting of acute  infection  Best Practice (right click and "Reselect all SmartList Selections" daily)   Diet/type: NPO DVT prophylaxis: prophylactic heparin  GI prophylaxis: N/A Lines: N/A Foley:  N/A Code Status:  full code Last date of multidisciplinary goals of care discussion [Discussed intubation with patient and husband, mentioned that if she were intubated she may not be able to be extubated given her underlying lung disease.  Pt affirms that she would still want all possible interventions]  Labs   CBC: Recent Labs  Lab 04/26/23 0120 04/26/23 0128  WBC 21.5*  --   HGB 12.5 13.9  HCT 41.0 41.0  MCV 112.6*  --   PLT 158  --     Basic Metabolic Panel: Recent Labs  Lab 04/26/23 0120 04/26/23 0128  NA 142 143  K 3.9 3.7  CL 107  --   CO2 16*  --   GLUCOSE 70  --   BUN 44*  --   CREATININE 1.50*  --   CALCIUM 8.9  --    GFR: Estimated Creatinine Clearance: 24.5 mL/min (A) (by C-G formula based on SCr of 1.5 mg/dL (H)). Recent Labs  Lab 04/26/23 0120  WBC 21.5*  LATICACIDVEN 4.1*    Liver Function Tests: Recent Labs  Lab 04/26/23 0120  AST 31  ALT 13  ALKPHOS 87  BILITOT 2.8*  PROT 6.4*  ALBUMIN 3.3*   No results for input(s): "LIPASE", "AMYLASE" in the last 168 hours. No results for input(s): "AMMONIA" in the last 168 hours.  ABG    Component Value Date/Time   HCO3 18.7 (L) 04/26/2023 0128   TCO2 20 (L) 04/26/2023 0128   ACIDBASEDEF 9.0 (H) 04/26/2023 0128   O2SAT 74 04/26/2023 0128     Coagulation Profile: Recent Labs  Lab 04/26/23 0120  INR 1.3*    Cardiac Enzymes: No results for input(s): "CKTOTAL", "CKMB", "CKMBINDEX", "TROPONINI" in the last 168 hours.  HbA1C: Hgb A1c MFr Bld  Date/Time Value Ref Range Status  07/01/2018 09:06 PM 5.6 4.8 - 5.6 % Final    Comment:    (NOTE) Pre diabetes:          5.7%-6.4% Diabetes:              >6.4% Glycemic control for   <7.0% adults with diabetes   07/01/2018 11:50 AM 5.4 4.8 - 5.6 % Final     Comment:    (NOTE)         Prediabetes: 5.7 - 6.4         Diabetes: >6.4         Glycemic control for adults with diabetes: <7.0     CBG: Recent Labs  Lab 04/26/23 0131 04/26/23 0314  GLUCAP 79 169*    Review of Systems:   Please see the history of present illness. All other systems reviewed and are negative    Past Medical History:  She,  has a past medical history of Ambulates with cane, Anemia, Arthritis, Arthritis, Cancer, uterine (HCC), Chest pain, unspecified (07-01-2018  per pt had 2 day chest pain and sob  6 weeks ago,, per pt no symptoms since), Diarrhea, unspecified, DOE (dyspnea on exertion), Endometrial cancer (HCC), Essential  hypertension, Frequency of urination, GERD (gastroesophageal reflux disease), Gout (rheumologist-- michelle young PA ([facility])), Heart murmur, History of adenomatous polyp of colon, History of esophageal dilatation, Mild nausea, Mixed dyslipidemia, Muscle weakness of extremity, Poor appetite, Type 2 diabetes mellitus (HCC), Urgency of urination, and Urine frequency.   Surgical History:   Past Surgical History:  Procedure Laterality Date   BREAST CYST EXCISION Right 1974   benign per pt   BRONCHIAL WASHINGS  06/27/2021   Procedure: BRONCHIAL WASHINGS;  Surgeon: Kalman Shan, MD;  Location: [facility];  Service: Endoscopy;;   CATARACT EXTRACTION W/ INTRAOCULAR LENS  IMPLANT, BILATERAL  2014;  2017   MOUTH SURGERY  11/2017   Upper quadrants,  per pt peridontist did laser procedure on gums   RIGHT HEART CATH N/A 02/14/2023   Procedure: RIGHT HEART CATH;  Surgeon: Orbie Pyo, MD;  Location: [facility]VASIVE [facility];  Service: Cardiovascular;  Laterality: N/A;   ROBOTIC ASSISTED TOTAL HYSTERECTOMY WITH BILATERAL SALPINGO OOPHERECTOMY Bilateral 07/22/2018   Procedure: XI ROBOTIC ASSISTED TOTAL HYSTERECTOMY WITH BILATERAL SALPINGO OOPHORECTOMY AND SENTINAL LYMPH NODE BIOPSY;  Surgeon: Adolphus Birchwood, MD;  Location: [facility] ORS;  Service:  Gynecology;  Laterality: Bilateral;   TONSILLECTOMY  child   VIDEO BRONCHOSCOPY N/A 06/27/2021   Procedure: VIDEO BRONCHOSCOPY WITHOUT FLUORO;  Surgeon: Kalman Shan, MD;  Location: [facility];  Service: Endoscopy;  Laterality: N/A;     Social History:   reports that she has never smoked. She has never used smokeless tobacco. She reports that she does not currently use alcohol. She reports that she does not use drugs.   Family History:  Her family history includes Clotting disorder in her father; Diabetes in her maternal grandfather; Heart disease in her father and mother; Heart failure in her mother; Prostate cancer in her father. There is no history of Colon cancer, Liver disease, Esophageal cancer, or Cancer - Colon.   Allergies Allergies  Allergen Reactions   Lovastatin     myalgia     Home Medications  Prior to Admission medications   Medication Sig Start Date End Date Taking? Authorizing Provider  acetaminophen (TYLENOL) 650 MG CR tablet Take 650 mg by mouth every 8 (eight) hours as needed for pain.   Yes [provider]  allopurinol (ZYLOPRIM) 300 MG tablet Take 300 mg by mouth daily.   Yes [provider]  amLODipine (NORVASC) 2.5 MG tablet TAKE 1 TABLET BY MOUTH ONCE DAILY Patient taking differently: Take 2.5 mg by mouth daily. 04/18/22  Yes Pricilla Riffle, MD  ASA-APAP-Caff Buffered (VANQUISH PO) Take 2 tablets by mouth 2 (two) times a week.   Yes [provider]  Baclofen 5 MG TABS Take 2.5-5 mg by mouth at bedtime as needed (muscle spasms). 09/25/21  Yes [provider]  dapagliflozin propanediol (FARXIGA) 10 MG TABS tablet Take 1 tablet (10 mg total) by mouth daily before breakfast. 03/11/23  Yes Laurey Morale, MD  diphenhydrAMINE (BENADRYL) 25 MG tablet Take 25 mg by mouth at bedtime as needed for sleep.    Yes [provider]  esomeprazole (NEXIUM) 40 MG capsule Take 1 capsule (40 mg total) by mouth daily before breakfast.  01/25/23  Yes Quentin Mulling R, PA-C  etanercept (ENBREL) 50 MG/ML injection Inject 25 mg into the skin 2 (two) times a week. Wednesday and Sunday   Yes [provider]  famotidine (PEPCID) 40 MG tablet Take 40 mg by mouth at bedtime.   Yes [provider]  Ferrous Gluconate 324 (37.5 Fe) MG TABS Take 1 tablet by mouth daily. 10/28/19  Yes [provider]  folic acid (FOLVITE) 800 MCG tablet Take 800 mcg by mouth daily.   Yes [provider]  ketoconazole (NIZORAL) 2 % cream Apply 1 Application topically daily as needed for irritation. 11/29/21  Yes [provider]  Melatonin 3 MG TABS Take 3-6 mg by mouth at bedtime as needed (sleep).    Yes [provider]  methotrexate 50 MG/2ML injection 25 mg once a week. 0.5 ml every Friday 11/06/18  Yes [provider]  oxybutynin (DITROPAN) 5 MG tablet Take 5 mg by mouth 2 (two) times daily.   Yes [provider]  rosuvastatin (CRESTOR) 5 MG tablet Take 1 tablet (5 mg total) by mouth daily. Start with one tablet every Monday, Wednesday, and Friday. Increase to one tablet daily if tolerating well. 05/28/22  Yes Pricilla Riffle, MD  vitamin B-12 (CYANOCOBALAMIN) 1000 MCG tablet Take 1,000 mcg by mouth daily.   Yes [provider]  B-D TB SYRINGE 1CC/27GX1/2" 27G X 1/2" 1 ML MISC USE WEEKLY WITH METHOTREXATE SUBCUTANEOUSLY 90 DAYS 01/17/19   [provider]  furosemide (LASIX) 20 MG tablet Take 1 tablet (20 mg total) by mouth as needed. Shortness of breath, lower leg swelling, gained more than 3lb in 24hr or 5lb in a week Patient not taking: Reported on 04/26/2023 04/18/23 07/17/23  Laurey Morale, MD  potassium chloride (KLOR-CON M) 10 MEQ tablet Take 2 tablets (20 mEq total) by mouth as needed. Patient not taking: Reported on 04/26/2023 04/18/23 07/17/23  Laurey Morale, MD     Critical care time: 50 minutes    CRITICAL CARE Performed by: Darcella Gasman Shianne Zeiser   Total critical care  time: 50 minutes  Critical care time was exclusive of separately billable procedures and treating other patients.  Critical care was necessary to treat or prevent imminent or life-threatening deterioration.  Critical care was time spent personally by me on the following activities: development of treatment plan with patient and/or surrogate as well as nursing, discussions with consultants, evaluation of patient's response to treatment, examination of patient, obtaining history from patient or surrogate, ordering and performing treatments and interventions, ordering and review of laboratory studies, ordering and review of radiographic studies, pulse oximetry and re-evaluation of patient's condition.  Darcella Gasman Carrol Hougland, PA-C [facility] See Amion for pager If no response to pager , please call [phone] until 7pm After 7:00 pm call Elink  [phone]

## 2023-04-26 NOTE — ED Triage Notes (Addendum)
Per ems, pt from home. Pt's husband called for pt in resp distress with spo2 at 76% on [place] at home baseline. GCS of 9 and CBG 49 and was alert enough to drink some OJ, up to cbg 79 en route. EMS BP 78/40. Rhonchi all fields and diminished. Husband reports pt has not eaten in the past 8 days d/t new medication for dehydration. EMS gave 15mg  albuterol, 1.0 atrovent, 125 solumedrol and NS bolus. IVs 18g and 22g. GCS 12 upon arrival to ER.

## 2023-04-26 NOTE — IPAL (Signed)
  Interdisciplinary Goals of Care Family Meeting   Date carried out: 04/26/2023  Location of the meeting: Bedside  Member's involved: Nurse Practitioner, Bedside Registered Nurse, and Family Member or next of kin  Durable Power of Attorney or acting medical decision maker:   [name] - Spouse   Discussion: Met with [name] this morning to discuss plan of care for Roiza.  [name] was updated regarding acute critical illness with multisystem organ dysfunction.  He states he understands that Annarae is very sick right now.  He endorses that Ardenia has told him on multiple occasions in the past that she would not want prolonged aggressive interventions if quality of life is affected.  [name] feels as if Kaoru was not in her right state of mind when she mentioned she would like to be intubated. She is encephalopathic on my exam 7/25.    Given underlying ILD, severe PAH, and now bacterial pneumonia in the setting of COVID decision was made to change CODE STATUS to DNR/DNI.  [name] want to continue all aggressive interventions currently but if Velma were to decompensate further he would likely consider transitioning to comfort care  Code status: Full DNR  Disposition: Continue current acute care   Time spent for the meeting: 32 min  Katonya Blecher D. Harris 04/26/2023, 8:50 AM

## 2023-04-26 NOTE — Progress Notes (Signed)
Pt transported on bipap from 2H01 to 2H03 without complication. RT and RN x2 accompanied pt.

## 2023-04-26 NOTE — Sepsis Progress Note (Signed)
Elink monitoring for the code sepsis protocol.  

## 2023-04-26 NOTE — Sepsis Progress Note (Signed)
Notified provider of need to order repeat lactic acid.  Also asked bedside RN to enter current weight into Epic.

## 2023-04-26 NOTE — Progress Notes (Signed)
Pharmacy Antibiotic Note  Madison Holden is a 77 y.o. female with pneumonia on Enbrel. And methotrexate PTA. Pharmacy has been consulted for cefepime dosing. -WBC= 30.7, afebrile, SCr= 1.39, CrCl ~ 35, PCT ~ 13  Plan: -Cefepime 2gm IV q12h -Will follow renal function, cultures and clinical progress   Weight: 57.2 kg (126 lb 1.7 oz)  Temp (24hrs), Avg:98 F (36.7 C), Min:97.9 F (36.6 C), Max:98 F (36.7 C)  Recent Labs  Lab 04/26/23 0120 04/26/23 0318 04/26/23 0434 04/26/23 0730  WBC 21.5*  --  30.7*  --   CREATININE 1.50*  --  1.39*  --   LATICACIDVEN 4.1* 4.4*  --  2.8*    Estimated Creatinine Clearance: 26.6 mL/min (A) (by C-G formula based on SCr of 1.39 mg/dL (H)).    Allergies  Allergen Reactions   Lovastatin     myalgia    Antimicrobials this admission: 7/5 CTX>> 7/5 7/5 cefepime  Dose adjustments this admission:   Microbiology results: 7/5 blood x2 7/5 COVID + 7/5 MRSA PCR- neg  Thank you for allowing pharmacy to be a part of this patient's care.  Harland German, PharmD Clinical Pharmacist **Pharmacist phone directory can now be found on [email or web address] ([email or web address]).  Listed under [facility].

## 2023-04-26 NOTE — Progress Notes (Signed)
RT NOTE:  Pt switched to NRB to assess respiratory status during transport. Pt tolerated transport and maintaining well. BIPAP on standby.

## 2023-04-26 NOTE — Progress Notes (Signed)
RT NOTE;  Pt BIB EMS. MD verbal order for BIPAP on arrival.    04/26/23 0115  BiPAP/CPAP/SIPAP  $ Non-Invasive Ventilator  Non-Invasive Vent Initial  $ Face Mask Medium Yes  BiPAP/CPAP/SIPAP Pt Type Adult  BiPAP/CPAP/SIPAP SERVO  Mask Type Full face mask  Mask Size Medium  Set Rate 15 breaths/min  Respiratory Rate 32 breaths/min  IPAP 16 cmH20  EPAP 6 cmH2O  Pressure Support 10 cmH20  PEEP 6 cmH20  FiO2 (%) 70 %  Flow Rate 0.9 lpm  Minute Ventilation 9.8  Leak 40  Peak Inspiratory Pressure (PIP) 15  Tidal Volume (Vt) 388  Patient Home Equipment No  Auto Titrate No  Press High Alarm 24 cmH2O  Press Low Alarm 2 cmH2O  Nasal massage performed No (comment)  CPAP/SIPAP surface wiped down Yes  BiPAP/CPAP /SiPAP Vitals  Pulse Rate (!) 132  Resp (!) 32  BP (!) 85/49  Bilateral Breath Sounds Diminished  MEWS Score/Color  MEWS Score 6  MEWS Score Color Red

## 2023-04-26 NOTE — TOC Initial Note (Signed)
Transition of Care [facility]) - Initial/Assessment Note    Patient Details  Name: Madison Holden MRN: [id number] Date of Birth: 05/25/1946  [facility]) CM/SW Contact:    Elliot Cousin, RN Phone Number: [phone] 04/26/2023, 2:29 PM  Clinical Narrative:  TOC CM spoke to pt's husband. States he is her primary caregiver in the home. States she does not have HH or aides at this time. Discussed HH and SNF rehab. Husband feels he can continue to care for her in the home. Will need PT/OT recommendations. Will continue to follow for dc needs.                  Expected Discharge Plan: Home w Home Health Services Barriers to Discharge: Continued Medical Work up   Patient Goals and CMS Choice Patient states their goals for this hospitalization and ongoing recovery are:: wants wife to recover CMS Medicare.gov Compare Post Acute Care list provided to:: Patient Represenative (must comment) (Madison Holden-husband)        Expected Discharge Plan and Services   Discharge Planning Services: CM Consult   Living arrangements for the past 2 months: Single Family Home                                      Prior Living Arrangements/Services Living arrangements for the past 2 months: Single Family Home Lives with:: Spouse Patient language and need for interpreter reviewed:: Yes Do you feel safe going back to the place where you live?: Yes      Need for Family Participation in Patient Care: Yes (Comment) Care giver support system in place?: Yes (comment) Current home services: DME (oxygen, bedside commode, rollator) Criminal Activity/Legal Involvement Pertinent to Current Situation/Hospitalization: No - Comment as needed  Activities of Daily Living      Permission Sought/Granted Permission sought to share information with : Case Manager, Family Supports, PCP Permission granted to share information with : Yes, Verbal Permission Granted  Share Information with NAME:  Madison Holden     Permission granted to share info w Relationship: husband  Permission granted to share info w Contact Information: [phone]  Emotional Assessment              Admission diagnosis:  Respiratory failure (HCC) [J96.90] Patient Active Problem List   Diagnosis Date Noted   Respiratory failure (HCC) 04/26/2023   COVID-19 virus infection 04/26/2023   PAH (pulmonary artery hypertension) (HCC) 04/26/2023   Septic shock (HCC) 04/26/2023   Chronic respiratory failure with hypoxia (HCC) 11/13/2022   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   Abnormal CT scan 11/13/2022   Acute on chronic respiratory failure with hypoxemia (HCC) 11/02/2022   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   ILD (interstitial lung disease) (HCC)    Immunosuppression due to drug therapy (HCC)    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   Endometrial cancer (HCC) 07/22/2018   Acute renal failure (ARF) (HCC) 07/01/2018   Endometrial carcinoma (HCC) 07/01/2018   Gout 07/01/2018   Hyperkalemia 07/01/2018   Morbid obesity (HCC) 06/26/2018   Chest pain 06/26/2018   Pre-operative cardiovascular examination 06/26/2018   Diabetes mellitus (HCC) 06/18/2018   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   Atherosclerosis of aorta (HCC) 05/02/2009   Mixed dyslipidemia 04/22/2009   Essential hypertension 04/15/2008   VENTRICULAR HYPERTROPHY, LEFT 04/15/2008   ESOPHAGEAL STRICTURE 04/15/2008   GERD 04/15/2008   PCP:  Henrine Screws, MD Pharmacy:  [facility] - [place], [place] - [address] [place] [address] Phone: [phone] Fax: [phone]  [facility] [address], [place] - [address] [place] [address] Phone: [phone] Fax: [phone]     Social Determinants of Health (SDOH) Social History: SDOH Screenings   Food Insecurity: No Food Insecurity (04/26/2023)  Housing: Low Risk  (04/26/2023)  Transportation Needs: No Transportation Needs  (04/26/2023)  Utilities: Not At Risk (04/26/2023)  Depression (PHQ2-9): Low Risk  (06/23/2019)  Tobacco Use: Low Risk  (04/26/2023)   SDOH Interventions: Food Insecurity Interventions: Intervention Not Indicated Housing Interventions: Intervention Not Indicated Transportation Interventions: Intervention Not Indicated Utilities Interventions: Intervention Not Indicated   Readmission Risk Interventions     No data to display

## 2023-04-26 NOTE — ED Provider Notes (Signed)
[facility]EMERGENCY DEPT [facility] Emergency Department Provider Note MRN:  [id number]  Arrival date & time: 04/26/23     Chief Complaint   Respiratory Distress   History of Present Illness   Madison Holden is a 77 y.o. year-old female with a history of uterine cancer, interstitial lung disease presenting to the ED with chief complaint of respiratory distress.  EMS called out for respiratory distress.  Appeared very unwell, hypoxic at 76%.  Hypoglycemic.  Also hypotensive with EMS.  Reportedly has had almost nothing to eat or drink for the past week at home, has not been doing well.  Review of Systems  A thorough review of systems was obtained and all systems are negative except as noted in the HPI and PMH.   Patient's Health History    Past Medical History:  Diagnosis Date   Ambulates with cane    due to gout    Anemia    Arthritis    hands   Arthritis    Cancer, uterine (HCC)    Chest pain, unspecified 07-01-2018  per pt had 2 day chest pain and sob  6 weeks ago,, per pt no symptoms since   cardiology-- dr Consuello Bossier--  scheduled for stress test and echo 07-03-2018 prior to surgery scheduled 07-08-2018   Diarrhea, unspecified    intermittant   DOE (dyspnea on exertion)    Endometrial cancer (HCC)    Essential hypertension    Frequency of urination    GERD (gastroesophageal reflux disease)    Gout rheumologist-- michelle young PA ([facility])   07-01-2018 per pt flare up april 2019 , hands and wrists   Heart murmur    History of adenomatous polyp of colon    History of esophageal dilatation    for stricture   Mild nausea    per pt intermittant , no vomiting   Mixed dyslipidemia    Muscle weakness of extremity    bilateral upper and lower extremities due to gout   Poor appetite    per pt    Type 2 diabetes mellitus (HCC)    followed by pcp   Urgency of urination    Urine frequency     Past Surgical History:  Procedure Laterality Date   BREAST  CYST EXCISION Right 1974   benign per pt   BRONCHIAL WASHINGS  06/27/2021   Procedure: BRONCHIAL WASHINGS;  Surgeon: Kalman Shan, MD;  Location: [facility] ENDOSCOPY;  Service: Endoscopy;;   CATARACT EXTRACTION W/ INTRAOCULAR LENS  IMPLANT, BILATERAL  2014;  2017   MOUTH SURGERY  11/2017   Upper quadrants,  per pt peridontist did laser procedure on gums   RIGHT HEART CATH N/A 02/14/2023   Procedure: RIGHT HEART CATH;  Surgeon: Orbie Pyo, MD;  Location: [facility] INVASIVE CV LAB;  Service: Cardiovascular;  Laterality: N/A;   ROBOTIC ASSISTED TOTAL HYSTERECTOMY WITH BILATERAL SALPINGO OOPHERECTOMY Bilateral 07/22/2018   Procedure: XI ROBOTIC ASSISTED TOTAL HYSTERECTOMY WITH BILATERAL SALPINGO OOPHORECTOMY AND SENTINAL LYMPH NODE BIOPSY;  Surgeon: Adolphus Birchwood, MD;  Location: [facility] ORS;  Service: Gynecology;  Laterality: Bilateral;   TONSILLECTOMY  child   VIDEO BRONCHOSCOPY N/A 06/27/2021   Procedure: VIDEO BRONCHOSCOPY WITHOUT FLUORO;  Surgeon: Kalman Shan, MD;  Location: [facility] END[facility];  Service: Endoscopy;  Laterality: N/A;    Family History  Problem Relation Age of Onset   Heart disease Mother    Heart failure Mother    Heart disease Father    Prostate cancer Father    Clotting  disorder Father    Diabetes Maternal Grandfather    Colon cancer Neg Hx    Liver disease Neg Hx    Esophageal cancer Neg Hx    Cancer - Colon Neg Hx     Social History   Socioeconomic History   Marital status: Married    Spouse name: Not on file   Number of children: 0   Years of education: Not on file   Highest education level: Not on file  Occupational History   Occupation: retired   Occupation: retired  Tobacco Use   Smoking status: Never   Smokeless tobacco: Never  Vaping Use   Vaping Use: Never used  Substance and Sexual Activity   Alcohol use: Not Currently    Comment: 2-3 per  week, not currently drinking since Apri; 2019   Drug use: No   Sexual activity: Not on file  Other Topics Concern    Not on file  Social History Narrative   Has never smoked. Daily Caffeine Use- 2 cups per day. Does not get regular exercise. Retired.    Social Determinants of Health   Financial Resource Strain: Not on file  Food Insecurity: Not on file  Transportation Needs: Not on file  Physical Activity: Not on file  Stress: Not on file  Social Connections: Not on file  Intimate Partner Violence: Not on file     Physical Exam   Vitals:   04/26/23 0615 04/26/23 0630  BP: 96/61 (!) 88/44  Pulse: (!) 110   Resp: 19   Temp:    SpO2: 97%     CONSTITUTIONAL: Ill-appearing, severe respiratory distress NEURO/PSYCH: Mildly somnolent, can answer questions and follow commands, moves all extremities EYES:  eyes equal and reactive ENT/NECK:  no LAD, no JVD CARDIO: Tachycardic rate, well-perfused, normal S1 and S2 PULM: Diffuse rhonchi GI/GU:  non-distended, non-tender MSK/SPINE:  No gross deformities, no edema SKIN:  no rash, atraumatic   *Additional and/or pertinent findings included in MDM below  Diagnostic and Interventional Summary    EKG Interpretation Date/Time:  April 26, 2023 at 01:08:14 Ventricular Rate:  129  PR Interval:   122 QRS Duration:   78 QT Interval:   304 QTC Calculation:  446 R Axis:      Text Interpretation: Sinus tachycardia       Labs Reviewed  SARS CORONAVIRUS 2 BY RT PCR - Abnormal; Notable for the following components:      Result Value   SARS Coronavirus 2 by RT PCR POSITIVE (*)    All other components within normal limits  CBC - Abnormal; Notable for the following components:   WBC 21.5 (*)    RBC 3.64 (*)    MCV 112.6 (*)    MCH 34.3 (*)    RDW 18.3 (*)    nRBC 1.4 (*)    All other components within normal limits  COMPREHENSIVE METABOLIC PANEL - Abnormal; Notable for the following components:   CO2 16 (*)    BUN 44 (*)    Creatinine, Ser 1.50 (*)    Total Protein 6.4 (*)    Albumin 3.3 (*)    Total Bilirubin 2.8 (*)    GFR, Estimated 36 (*)     Anion gap 19 (*)    All other components within normal limits  BRAIN NATRIURETIC PEPTIDE - Abnormal; Notable for the following components:   B Natriuretic Peptide 839.4 (*)    All other components within normal limits  LACTIC ACID, PLASMA - Abnormal;  Notable for the following components:   Lactic Acid, Venous 4.1 (*)    All other components within normal limits  PROTIME-INR - Abnormal; Notable for the following components:   Prothrombin Time 16.6 (*)    INR 1.3 (*)    All other components within normal limits  LACTIC ACID, PLASMA - Abnormal; Notable for the following components:   Lactic Acid, Venous 4.4 (*)    All other components within normal limits  CBC - Abnormal; Notable for the following components:   WBC 30.7 (*)    RBC 3.40 (*)    Hemoglobin 11.2 (*)    MCV 108.5 (*)    RDW 18.6 (*)    Platelets 149 (*)    nRBC 0.7 (*)    All other components within normal limits  BASIC METABOLIC PANEL - Abnormal; Notable for the following components:   Glucose, Bld 206 (*)    BUN 39 (*)    Creatinine, Ser 1.39 (*)    Calcium 8.3 (*)    GFR, Estimated 40 (*)    All other components within normal limits  MAGNESIUM - Abnormal; Notable for the following components:   Magnesium 1.4 (*)    All other components within normal limits  GLUCOSE, CAPILLARY - Abnormal; Notable for the following components:   Glucose-Capillary 207 (*)    All other components within normal limits  I-STAT VENOUS BLOOD GAS, ED - Abnormal; Notable for the following components:   pH, Ven 7.204 (*)    pO2, Ven 48 (*)    Bicarbonate 18.7 (*)    TCO2 20 (*)    Acid-base deficit 9.0 (*)    All other components within normal limits  CBG MONITORING, ED - Abnormal; Notable for the following components:   Glucose-Capillary 169 (*)    All other components within normal limits  POCT I-STAT 7, (LYTES, BLD GAS, ICA,H+H) - Abnormal; Notable for the following components:   pH, Arterial 7.179 (*)    pCO2 arterial 63.7 (*)     pO2, Arterial 169 (*)    Acid-base deficit 5.0 (*)    HCT 35.0 (*)    Hemoglobin 11.9 (*)    All other components within normal limits  TROPONIN I (HIGH SENSITIVITY) - Abnormal; Notable for the following components:   Troponin I (High Sensitivity) 86 (*)    All other components within normal limits  TROPONIN I (HIGH SENSITIVITY) - Abnormal; Notable for the following components:   Troponin I (High Sensitivity) 105 (*)    All other components within normal limits  RESPIRATORY PANEL BY PCR  MRSA NEXT GEN BY PCR, NASAL  CULTURE, BLOOD (ROUTINE X 2)  CULTURE, BLOOD (ROUTINE X 2)  EXPECTORATED SPUTUM ASSESSMENT W GRAM STAIN, RFLX TO RESP C  TSH  URINALYSIS, ROUTINE W REFLEX MICROSCOPIC  BLOOD GAS, ARTERIAL  PROCALCITONIN  LACTIC ACID, PLASMA  CBG MONITORING, ED    CT Angio Chest Pulmonary Embolism (PE) W or WO Contrast  Final Result    CT ABDOMEN PELVIS W CONTRAST  Final Result    DG Chest Port 1 View  Final Result      Medications  docusate sodium (COLACE) capsule 100 mg (has no administration in time range)  polyethylene glycol (MIRALAX / GLYCOLAX) packet 17 g (has no administration in time range)  heparin injection 5,000 Units (5,000 Units Subcutaneous Given 04/26/23 0634)  azithromycin (ZITHROMAX) 500 mg in sodium chloride 0.9 % 250 mL IVPB (500 mg Intravenous New Bag/Given 04/26/23 0511)  cefTRIAXone (ROCEPHIN)  1 g in sodium chloride 0.9 % 100 mL IVPB (has no administration in time range)  arformoterol (BROVANA) nebulizer solution 15 mcg (has no administration in time range)  revefenacin (YUPELRI) nebulizer solution 175 mcg (has no administration in time range)  budesonide (PULMICORT) nebulizer solution 0.25 mg (has no administration in time range)  Chlorhexidine Gluconate Cloth 2 % PADS 6 each (6 each Topical Given 04/26/23 0508)  0.9 %  sodium chloride infusion (250 mLs Intravenous New Bag/Given 04/26/23 0630)  norepinephrine (LEVOPHED) 4mg  in (0.016 mg/mL) premix  infusion (12 mcg/min Intravenous IV Pump Association 04/26/23 0633)  dexmedetomidine (PRECEDEX) 400 MCG/100ML (4 mcg/mL) infusion (0.1 mcg/kg/hr  57.2 kg Intravenous New Bag/Given 04/26/23 0540)  sodium chloride 0.9 % bolus 500 mL (0 mLs Intravenous Stopped 04/26/23 0132)  cefTRIAXone (ROCEPHIN) 2 g in sodium chloride 0.9 % 100 mL IVPB (0 g Intravenous Stopped 04/26/23 0215)  dextrose 50 % solution 25 g (25 g Intravenous Given 04/26/23 0200)  lactated ringers bolus 1,000 mL (0 mLs Intravenous Stopped 04/26/23 0230)  lactated ringers bolus 250 mL (250 mLs Intravenous New Bag/Given 04/26/23 0401)  methylPREDNISolone sodium succinate (SOLU-MEDROL) 125 mg/2 mL injection 125 mg (125 mg Intravenous Given 04/26/23 0406)  iohexol (OMNIPAQUE) 350 MG/ML injection 60 mL (60 mLs Intravenous Contrast Given 04/26/23 0349)     Procedures  /  Critical Care .Critical Care  Performed by: Sabas Sous, MD Authorized by: Sabas Sous, MD   Critical care provider statement:    Critical care time (minutes):  45   Critical care was necessary to treat or prevent imminent or life-threatening deterioration of the following conditions:  Sepsis and shock   Critical care was time spent personally by me on the following activities:  Development of treatment plan with patient or surrogate, discussions with consultants, evaluation of patient's response to treatment, examination of patient, ordering and review of laboratory studies, ordering and review of radiographic studies, ordering and performing treatments and interventions, pulse oximetry, re-evaluation of patient's condition and review of old charts   ED Course and Medical Decision Making  Initial Impression and Ddx Hypoxic respiratory failure, known history of interstitial lung disease.  Also very poor p.o. intake for the past week.  Hypotensive on arrival as well.  Given the report of p.o. intake being an issue suspect may be hypovolemic, has a fairly recent echo from earlier  this year demonstrating fairly normal ejection fraction.  Chest x-ray on arrival showing diffuse opacities but seems unchanged from prior, suspect this is due to her underlying interstitial lung disease rather than acute pulmonary edema.  She has no JVD, she has no lower extremity edema.  Providing further fluids, also low-dose norepinephrine.  Differential diagnosis includes sepsis, PE, electrolyte disturbance, profound dehydration, flare of ILD, ACS, pneumonia.  Past medical/surgical history that increases complexity of ED encounter: Interstitial lung disease  Interpretation of Diagnostics I personally reviewed the EKG and my interpretation is as follows: Sinus tachycardia  Labs reveal marked leukocytosis, lactic acidosis  Patient Reassessment and Ultimate Disposition/Management     Admitted to ICU for unexplained shock, respiratory failure.  CT imaging pending.  Patient management required discussion with the following services or consulting groups:  Intensivist Service  Complexity of Problems Addressed Acute illness or injury that poses threat of life of bodily function  Additional Data Reviewed and Analyzed Further history obtained from: EMS on arrival and Further history from spouse/family member  Additional Factors Impacting ED Encounter Risk Consideration of hospitalization  Casimiro Needle  Randol Kern, MD [facility] Emergency Medicine [facility] [email or web address]  Final Clinical Impressions(s) / ED Diagnoses     ICD-10-CM   1. Septic shock (HCC)  A41.9    R65.21       ED Discharge Orders     None        Discharge Instructions Discussed with and Provided to Patient:   Discharge Instructions   None      Sabas Sous, MD 04/26/23 [phone]

## 2023-04-26 NOTE — Progress Notes (Signed)
eLink Physician-Brief Progress Note Patient Name: Madison Holden DOB: August 18, 1946 MRN: [id number]   Date of Service  04/26/2023  HPI/Events of Note  Patient admitted with acute on chronic hypoxemic respiratory failure secondary to pneumonia, in the context of a background of a history of ILD, patient also with hypotension and lactic acidosis, consistent with sepsis / shock.  eICU Interventions  New Patient Evaluation.        Madison Holden 04/26/2023, 5:23 AM

## 2023-04-26 NOTE — ED Notes (Signed)
ED TO INPATIENT HANDOFF REPORT  ED Nurse Name and Phone #:  Johnna Acosta RN [phone]  S Name/Age/Gender Madison Holden 77 y.o. female Room/Bed: TRAAC/TRAAC  Code Status   Code Status: Full Code  Home/SNF/Other Home Patient oriented to: self Is this baseline? No   Triage Complete: Triage complete  Chief Complaint Respiratory failure (HCC) [J96.90]  Triage Note Per ems, pt from home. Pt's husband called for pt in resp distress with spo2 at 76% on [place] at home baseline. GCS of 9 and CBG 49 and was alert enough to drink some OJ, up to cbg 79 en route. EMS BP 78/40. Rhonchi all fields and diminished. Husband reports pt has not eaten in the past 8 days d/t new medication for dehydration. EMS gave 15mg  albuterol, 1.0 atrovent, 125 solumedrol and NS bolus. IVs 18g and 22g. GCS 12 upon arrival to ER.    Allergies Allergies  Allergen Reactions   Lovastatin     myalgia    Level of Care/Admitting Diagnosis ED Disposition     ED Disposition  Admit   Condition  --   Comment  Hospital [facility] [[id number]]  Level of Care: ICU [6]  May admit patient to [facility] or [facility] if equivalent level of care is available:: No  Covid Evaluation: Symptomatic Person Under Investigation (PUI) or recent exposure (last 10 days) *Testing Required*  Diagnosis: Respiratory failure [facility]) [[id number]]  Admitting Physician: Josephine Igo [[id number]]  Attending Physician: Josephine Igo [[id number]]  Certification:: I certify this patient will need inpatient services for at least 2 midnights  Estimated Length of Stay: 2          B Medical/Surgery History Past Medical History:  Diagnosis Date   Ambulates with cane    due to gout    Anemia    Arthritis    hands   Arthritis    Cancer, uterine (HCC)    Chest pain, unspecified 07-01-2018  per pt had 2 day chest pain and sob  6 weeks ago,, per pt no symptoms since   cardiology-- dr Consuello Bossier--  scheduled for stress  test and echo 07-03-2018 prior to surgery scheduled 07-08-2018   Diarrhea, unspecified    intermittant   DOE (dyspnea on exertion)    Endometrial cancer (HCC)    Essential hypertension    Frequency of urination    GERD (gastroesophageal reflux disease)    Gout rheumologist-- michelle young PA ([facility])   07-01-2018 per pt flare up april 2019 , hands and wrists   Heart murmur    History of adenomatous polyp of colon    History of esophageal dilatation    for stricture   Mild nausea    per pt intermittant , no vomiting   Mixed dyslipidemia    Muscle weakness of extremity    bilateral upper and lower extremities due to gout   Poor appetite    per pt    Type 2 diabetes mellitus (HCC)    followed by pcp   Urgency of urination    Urine frequency    Past Surgical History:  Procedure Laterality Date   BREAST CYST EXCISION Right 1974   benign per pt   BRONCHIAL WASHINGS  06/27/2021   Procedure: BRONCHIAL WASHINGS;  Surgeon: Kalman Shan, MD;  Location: [facility];  Service: Endoscopy;;   CATARACT EXTRACTION W/ INTRAOCULAR LENS  IMPLANT, BILATERAL  2014;  2017   MOUTH SURGERY  11/2017   Upper quadrants,  per pt peridontist did laser procedure on gums   RIGHT HEART CATH N/A 02/14/2023   Procedure: RIGHT HEART CATH;  Surgeon: Orbie Pyo, MD;  [facility] INVASIVE CV LAB;  Service: Cardiovascular;  Laterality: N/A;   ROBOTIC ASSISTED TOTAL HYSTERECTOMY WITH BILATERAL SALPINGO OOPHERECTOMY Bilateral 07/22/2018   Procedure: XI ROBOTIC ASSISTED TOTAL HYSTERECTOMY WITH BILATERAL SALPINGO OOPHORECTOMY AND SENTINAL LYMPH NODE BIOPSY;  Surgeon: Adolphus Birchwood, MD;  Location: [facility];  Service: Gynecology;  Laterality: Bilateral;   TONSILLECTOMY  child   VIDEO BRONCHOSCOPY N/A 06/27/2021   Procedure: VIDEO BRONCHOSCOPY WITHOUT FLUORO;  Surgeon: Kalman Shan, MD;  Location: [facility];  Service: Endoscopy;  Laterality: N/A;     A IV Location/Drains/Wounds Patient  Lines/Drains/Airways Status     Active Line/Drains/Airways     Name Placement date Placement time Site Days   Peripheral IV 04/26/23 18 G Anterior;Distal;Left Forearm 04/26/23  0120  Forearm  less than 1   Peripheral IV 04/26/23 20 G Right Antecubital 04/26/23  0121  Antecubital  less than 1   Peripheral IV 04/26/23 22 G Anterior;Right Hand 04/26/23  0121  Hand  less than 1   Incision - 5 Ports Abdomen Umbilicus Right Left Left;Distal Left;Superior 07/22/18  1120  -- 1739            Intake/Output Last 24 hours  Intake/Output Summary (Last 24 hours) at 04/26/2023 0437 Last data filed at 04/26/2023 0230 Gross per 24 hour  Intake 1600 ml  Output --  Net 1600 ml    Labs/Imaging Results for orders placed or performed during the hospital encounter of 04/26/23 (from the past 48 hour(s))  CBC     Status: Abnormal   Collection Time: 04/26/23  1:20 AM  Result Value Ref Range   WBC 21.5 (H) 4.0 - 10.5 K/uL   RBC 3.64 (L) 3.87 - 5.11 MIL/uL   Hemoglobin 12.5 12.0 - 15.0 g/dL   HCT 16.1 09.6 - 04.5 %   MCV 112.6 (H) 80.0 - 100.0 fL   MCH 34.3 (H) 26.0 - 34.0 pg   MCHC 30.5 30.0 - 36.0 g/dL   RDW 40.9 (H) 81.1 - 91.4 %   Platelets 158 150 - 400 K/uL   nRBC 1.4 (H) 0.0 - 0.2 %    Comment: Performed at [facility], [address], [place], [place] [address]  Comprehensive metabolic panel     Status: Abnormal   Collection Time: 04/26/23  1:20 AM  Result Value Ref Range   Sodium 142 135 - 145 mmol/L   Potassium 3.9 3.5 - 5.1 mmol/L   Chloride 107 98 - 111 mmol/L   CO2 16 (L) 22 - 32 mmol/L   Glucose, Bld 70 70 - 99 mg/dL    Comment: Glucose reference range applies only to samples taken after fasting for at least 8 hours.   BUN 44 (H) 8 - 23 mg/dL   Creatinine, Ser 6.21 (H) 0.44 - 1.00 mg/dL   Calcium 8.9 8.9 - 30.8 mg/dL   Total Protein 6.4 (L) 6.5 - 8.1 g/dL   Albumin 3.3 (L) 3.5 - 5.0 g/dL   AST 31 15 - 41 U/L   ALT 13 0 - 44 U/L   Alkaline Phosphatase 87 38 - 126 U/L    Total Bilirubin 2.8 (H) 0.3 - 1.2 mg/dL   GFR, Estimated 36 (L) >60 mL/min    Comment: (NOTE) Calculated using the CKD-EPI Creatinine Equation (2021)    Anion gap 19 (H) 5 -  15    Comment: Performed at [facility], [address], [place], [place] [address]  Troponin I (High Sensitivity)     Status: Abnormal   Collection Time: 04/26/23  1:20 AM  Result Value Ref Range   Troponin I (High Sensitivity) 86 (H) <18 ng/L    Comment: (NOTE) Elevated high sensitivity troponin I (hsTnI) values and significant  changes across serial measurements may suggest ACS but many other  chronic and acute conditions are known to elevate hsTnI results.  Refer to the "Links" section for chest pain algorithms and additional  guidance. Performed at [facility], [address] [place], [place] [address]   Brain natriuretic peptide     Status: Abnormal   Collection Time: 04/26/23  1:20 AM  Result Value Ref Range   B Natriuretic Peptide 839.4 (H) 0.0 - 100.0 pg/mL    Comment: Performed at [facility], [address], [place], [place] [address]  Lactic acid, plasma     Status: Abnormal   Collection Time: 04/26/23  1:20 AM  Result Value Ref Range   Lactic Acid, Venous 4.1 (HH) 0.5 - 1.9 mmol/L    Comment: CRITICAL RESULT CALLED TO, READ BACK BY AND VERIFIED WITH Helmut Muster RN @ 0210 04/26/23 JBUTLER Performed at [facility], [address] [place], [place] [address]   Protime-INR     Status: Abnormal   Collection Time: 04/26/23  1:20 AM  Result Value Ref Range   Prothrombin Time 16.6 (H) 11.4 - 15.2 seconds   INR 1.3 (H) 0.8 - 1.2    Comment: (NOTE) INR goal varies based on device and disease states. Performed at [facility], [address], [place], [place] [address]   I-Stat venous blood gas, ED     Status: Abnormal   Collection Time: 04/26/23  1:28 AM  Result Value Ref Range   pH, Ven 7.204 (L) 7.25 - 7.43   pCO2, Ven 47.4 44 - 60 mmHg   pO2, Ven 48 (H) 32 -  45 mmHg   Bicarbonate 18.7 (L) 20.0 - 28.0 mmol/L   TCO2 20 (L) 22 - 32 mmol/L   O2 Saturation 74 %   Acid-base deficit 9.0 (H) 0.0 - 2.0 mmol/L   Sodium 143 135 - 145 mmol/L   Potassium 3.7 3.5 - 5.1 mmol/L   Calcium, Ion 1.15 1.15 - 1.40 mmol/L   HCT 41.0 36.0 - 46.0 %   Hemoglobin 13.9 12.0 - 15.0 g/dL   Sample type VENOUS   POC CBG, ED     Status: None   Collection Time: 04/26/23  1:31 AM  Result Value Ref Range   Glucose-Capillary 79 70 - 99 mg/dL    Comment: Glucose reference range applies only to samples taken after fasting for at least 8 hours.  CBG monitoring, ED     Status: Abnormal   Collection Time: 04/26/23  3:14 AM  Result Value Ref Range   Glucose-Capillary 169 (H) 70 - 99 mg/dL    Comment: Glucose reference range applies only to samples taken after fasting for at least 8 hours.  Lactic acid, plasma     Status: Abnormal   Collection Time: 04/26/23  3:18 AM  Result Value Ref Range   Lactic Acid, Venous 4.4 (HH) 0.5 - 1.9 mmol/L    Comment: CRITICAL VALUE NOTED. VALUE IS CONSISTENT WITH PREVIOUSLY REPORTED/CALLED VALUE Performed at [facility], [address], [place], [place] [address]  Troponin I (High Sensitivity)     Status: Abnormal   Collection Time: 04/26/23  3:18 AM  Result Value Ref Range   Troponin I (High Sensitivity) 105 (HH) <18 ng/L    Comment: CRITICAL RESULT CALLED TO, READ BACK BY AND VERIFIED WITH FRIETAS,F RN @0405  04/26/23 SATRAINR (NOTE) Elevated high sensitivity troponin I (hsTnI) values and significant  changes across serial measurements may suggest ACS but many other  chronic and acute conditions are known to elevate hsTnI results.  Refer to the "Links" section for chest pain algorithms and additional  guidance. Performed at [facility], [address], [place], [place] [address]    CT Angio Chest Pulmonary Embolism (PE) W or WO Contrast  Result Date: 04/26/2023 CLINICAL DATA:  Respiratory distress, hypoxia, hypotension.  EXAM: CT ANGIOGRAPHY CHEST CT ABDOMEN AND PELVIS WITH CONTRAST TECHNIQUE: Multidetector CT imaging of the chest was performed using the standard protocol during bolus administration of intravenous contrast. Multiplanar CT image reconstructions and MIPs were obtained to evaluate the vascular anatomy. Multidetector CT imaging of the abdomen and pelvis was performed using the standard protocol during bolus administration of intravenous contrast. RADIATION DOSE REDUCTION: This exam was performed according to the departmental dose-optimization program which includes automated exposure control, adjustment of the mA and/or kV according to patient size and/or use of iterative reconstruction technique. CONTRAST:  60mL OMNIPAQUE IOHEXOL 350 MG/ML SOLN COMPARISON:  CT chest dated 01/31/2023. CT abdomen/pelvis dated 07/03/2018. FINDINGS: CTA CHEST FINDINGS Cardiovascular: Satisfactory opacification the bilateral pulmonary arteries to the lobar level. Bilateral lower lobe evaluation is constrained by respiratory motion and right lower lobe opacity (described below. Within that constraint, there is no evidence of pulmonary embolism. Enlargement the main pulmonary artery, suggesting pulmonary arterial hypertension. Although not tailored for evaluation of the thoracic aorta, there is no evidence of thoracic aortic aneurysm or dissection. Atherosclerotic calcifications of the aortic arch. Heart is top-normal in size.  No pericardial effusion. Moderate coronary atherosclerosis of the LAD. Mediastinum/Nodes: Small mediastinal lymph nodes, including a dominant 13 mm short axis right paratracheal node (series 5/image 48), favored to be reactive. Visualized thyroid is unremarkable. Lungs/Pleura: Patchy right lower lobe opacity, suspicious for pneumonia. Subpleural reticulation/fibrosis in the lungs bilaterally, right upper lobe predominant, compatible with the patient's known chronic interstitial lung disease. No interval progression.  Trace right pleural effusion.  No pneumothorax. Musculoskeletal: Degenerative changes of the thoracic spine. Review of the MIP images confirms the above findings. CT ABDOMEN and PELVIS FINDINGS Motion degraded images. Hepatobiliary: Liver is within normal limits. Gallbladder is unremarkable. No intrahepatic or extrahepatic ductal dilatation. Pancreas: Within normal limits. Spleen: Within normal limits. Adrenals/Urinary Tract: Adrenal glands are within normal limits. Kidneys are within normal limits.  No hydronephrosis. Bladder is within normal limits. Stomach/Bowel: Stomach is notable for a moderate hiatal hernia. No evidence of bowel obstruction. Appendix is not discretely visualized. Cecum/ileocecal valve are just to the right of midline in the lower pelvis, reflecting a mobile cecum. No colonic wall thickening or inflammatory changes. Vascular/Lymphatic: No evidence of abdominal aortic aneurysm. Atherosclerotic calcifications of the abdominal aorta and branch vessels. No suspicious abdominopelvic lymphadenopathy. Reproductive: Status post hysterectomy. No adnexal masses. Other: No abdominopelvic ascites. Musculoskeletal: Degenerative changes of the lumbar spine. Review of the MIP images confirms the above findings. IMPRESSION: No evidence of pulmonary embolism. Right lower lobe pneumonia.  Trace right pleural effusion. Chronic interstitial lung disease, as above. Moderate hiatal hernia.  Otherwise negative CT abdomen/pelvis. Electronically Signed   By: Charline Bills  M.D.   On: 04/26/2023 03:58   CT ABDOMEN PELVIS W CONTRAST  Result Date: 04/26/2023 CLINICAL DATA:  Respiratory distress, hypoxia, hypotension. EXAM: CT ANGIOGRAPHY CHEST CT ABDOMEN AND PELVIS WITH CONTRAST TECHNIQUE: Multidetector CT imaging of the chest was performed using the standard protocol during bolus administration of intravenous contrast. Multiplanar CT image reconstructions and MIPs were obtained to evaluate the vascular anatomy.  Multidetector CT imaging of the abdomen and pelvis was performed using the standard protocol during bolus administration of intravenous contrast. RADIATION DOSE REDUCTION: This exam was performed according to the departmental dose-optimization program which includes automated exposure control, adjustment of the mA and/or kV according to patient size and/or use of iterative reconstruction technique. CONTRAST:  60mL OMNIPAQUE IOHEXOL 350 MG/ML SOLN COMPARISON:  CT chest dated 01/31/2023. CT abdomen/pelvis dated 07/03/2018. FINDINGS: CTA CHEST FINDINGS Cardiovascular: Satisfactory opacification the bilateral pulmonary arteries to the lobar level. Bilateral lower lobe evaluation is constrained by respiratory motion and right lower lobe opacity (described below. Within that constraint, there is no evidence of pulmonary embolism. Enlargement the main pulmonary artery, suggesting pulmonary arterial hypertension. Although not tailored for evaluation of the thoracic aorta, there is no evidence of thoracic aortic aneurysm or dissection. Atherosclerotic calcifications of the aortic arch. Heart is top-normal in size.  No pericardial effusion. Moderate coronary atherosclerosis of the LAD. Mediastinum/Nodes: Small mediastinal lymph nodes, including a dominant 13 mm short axis right paratracheal node (series 5/image 48), favored to be reactive. Visualized thyroid is unremarkable. Lungs/Pleura: Patchy right lower lobe opacity, suspicious for pneumonia. Subpleural reticulation/fibrosis in the lungs bilaterally, right upper lobe predominant, compatible with the patient's known chronic interstitial lung disease. No interval progression. Trace right pleural effusion.  No pneumothorax. Musculoskeletal: Degenerative changes of the thoracic spine. Review of the MIP images confirms the above findings. CT ABDOMEN and PELVIS FINDINGS Motion degraded images. Hepatobiliary: Liver is within normal limits. Gallbladder is unremarkable. No  intrahepatic or extrahepatic ductal dilatation. Pancreas: Within normal limits. Spleen: Within normal limits. Adrenals/Urinary Tract: Adrenal glands are within normal limits. Kidneys are within normal limits.  No hydronephrosis. Bladder is within normal limits. Stomach/Bowel: Stomach is notable for a moderate hiatal hernia. No evidence of bowel obstruction. Appendix is not discretely visualized. Cecum/ileocecal valve are just to the right of midline in the lower pelvis, reflecting a mobile cecum. No colonic wall thickening or inflammatory changes. Vascular/Lymphatic: No evidence of abdominal aortic aneurysm. Atherosclerotic calcifications of the abdominal aorta and branch vessels. No suspicious abdominopelvic lymphadenopathy. Reproductive: Status post hysterectomy. No adnexal masses. Other: No abdominopelvic ascites. Musculoskeletal: Degenerative changes of the lumbar spine. Review of the MIP images confirms the above findings. IMPRESSION: No evidence of pulmonary embolism. Right lower lobe pneumonia.  Trace right pleural effusion. Chronic interstitial lung disease, as above. Moderate hiatal hernia.  Otherwise negative CT abdomen/pelvis. Electronically Signed   By: Charline Bills M.D.   On: 04/26/2023 03:58   DG Chest Port 1 View  Result Date: 04/26/2023 CLINICAL DATA:  Shortness of breath. EXAM: PORTABLE CHEST 1 VIEW COMPARISON:  04/23/2023, 01/31/2023. FINDINGS: The heart is enlarged and the mediastinal contour is within normal limits. The pulmonary vasculature is distended. Interstitial prominence is present bilaterally with scattered airspace opacities bilaterally. No definite effusion or pneumothorax. No acute osseous abnormality. IMPRESSION: 1. Cardiomegaly with mildly distended pulmonary vasculature. 2. Interstitial prominence bilaterally scattered airspace opacities, not significantly changed from prior exam and may be associated with patient's known interstitial lung disease. Correlate clinically to  exclude superimposed edema or pneumonia. Electronically Signed  By: Thornell Sartorius M.D.   On: 04/26/2023 01:18    Pending Labs Unresulted Labs (From admission, onward)     Start     Ordered   04/26/23 0500  Basic metabolic panel  Tomorrow morning,   R        04/26/23 0339   04/26/23 0500  Blood gas, arterial  Tomorrow morning,   R        04/26/23 0339   04/26/23 0500  Magnesium  Tomorrow morning,   R        04/26/23 0339   04/26/23 0500  TSH  Tomorrow morning,   R        04/26/23 0412   04/26/23 0419  Expectorated Sputum Assessment w Gram Stain, Rflx to Resp Cult  Once,   R        04/26/23 0418   04/26/23 0351  Procalcitonin  Once,   R       References:    Procalcitonin Lower Respiratory Tract Infection AND Sepsis Procalcitonin Algorithm   04/26/23 0350   04/26/23 0349  Respiratory (~20 pathogens) panel by PCR  (Respiratory panel by PCR (~20 pathogens, ~24 hr TAT)  w precautions)  Once,   R        04/26/23 0348   04/26/23 0349  SARS Coronavirus 2 by RT PCR (hospital order, performed in [facility] hospital lab) *cepheid single result test* Anterior Nasal Swab  (Tier 2 - SARS Coronavirus 2 by RT PCR (hospital order, performed in [facility] hospital lab) *cepheid single result test*)  Once,   R        04/26/23 0348   04/26/23 0338  CBC  (heparin)  Once,   R       Comments: Baseline for heparin therapy IF NOT ALREADY DRAWN.  Notify MD if PLT < 100 K.    04/26/23 0339   04/26/23 0119  Urinalysis, Routine w reflex microscopic -Urine, Clean Catch  Once,   URGENT       Question:  Specimen Source  Answer:  Urine, Clean Catch   04/26/23 0118   04/26/23 0108  Culture, blood (Routine X 2) w Reflex to ID Panel  BLOOD CULTURE X 2,   R (with STAT occurrences)      04/26/23 0107            Vitals/Pain Today's Vitals   04/26/23 0355 04/26/23 0400 04/26/23 0410 04/26/23 0420  BP:  (!) 98/51 (!) 95/58 (!) 88/53  Pulse: (!) 129 (!) 118 (!) 118 (!) 113  Resp: (!) 22 (!) 22 (!) 30 18  Temp:       TempSrc:      SpO2:  99% 98% 98%  PainSc:        Isolation Precautions No active isolations  Medications Medications  norepinephrine (LEVOPHED) 4mg  in (0.016 mg/mL) premix infusion (9 mcg/min Intravenous Rate/Dose Change 04/26/23 0424)  docusate sodium (COLACE) capsule 100 mg (has no administration in time range)  polyethylene glycol (MIRALAX / GLYCOLAX) packet 17 g (has no administration in time range)  heparin injection 5,000 Units (has no administration in time range)  azithromycin (ZITHROMAX) 500 mg in sodium chloride 0.9 % 250 mL IVPB (has no administration in time range)  cefTRIAXone (ROCEPHIN) 1 g in sodium chloride 0.9 % 100 mL IVPB (has no administration in time range)  arformoterol (BROVANA) nebulizer solution 15 mcg (has no administration in time range)  revefenacin (YUPELRI) nebulizer solution 175 mcg (has no administration in time  range)  budesonide (PULMICORT) nebulizer solution 0.25 mg (has no administration in time range)  sodium chloride 0.9 % bolus 500 mL (0 mLs Intravenous Stopped 04/26/23 0132)  cefTRIAXone (ROCEPHIN) 2 g in sodium chloride 0.9 % 100 mL IVPB (0 g Intravenous Stopped 04/26/23 0215)  dextrose 50 % solution 25 g (25 g Intravenous Given 04/26/23 0200)  lactated ringers bolus 1,000 mL (0 mLs Intravenous Stopped 04/26/23 0230)  lactated ringers bolus 250 mL (250 mLs Intravenous New Bag/Given 04/26/23 0401)  methylPREDNISolone sodium succinate (SOLU-MEDROL) 125 mg/2 mL injection 125 mg (125 mg Intravenous Given 04/26/23 0406)  iohexol (OMNIPAQUE) 350 MG/ML injection 60 mL (60 mLs Intravenous Contrast Given 04/26/23 0349)    Mobility walks     Focused Assessments Pulmonary Assessment Handoff:  Lung sounds: Bilateral Breath Sounds: Rhonchi, Diminished L Breath Sounds: Diminished R Breath Sounds: Diminished O2 Device: Bi-PAP      R Recommendations: See Admitting Provider Note  Report given to:   Additional Notes:

## 2023-04-26 NOTE — Sepsis Progress Note (Signed)
Notified provider of need to order repeat lactic acid. ° °

## 2023-04-26 NOTE — Progress Notes (Signed)
PHARMACY - PHYSICIAN COMMUNICATION CRITICAL VALUE ALERT - BLOOD CULTURE IDENTIFICATION (BCID)  Madison Holden is an 77 y.o. female who presented to [facility] on 04/26/2023 with a chief complaint of PNA  Assessment:  MSSA and pseudomonas bacteremia (suspected source - PNA)  Name of physician (or Provider) Contacted: Dr. Merrily Pew  Current antibiotics: Cefepime  Changes to prescribed antibiotics recommended:  Patient is on recommended antibiotics - No changes needed.  ID will get automatic consult for MSSA bacteremia  Results for orders placed or performed during the hospital encounter of 04/26/23  Blood Culture ID Panel (Reflexed) (Collected: 04/26/2023  1:20 AM)  Result Value Ref Range   Enterococcus faecalis NOT DETECTED NOT DETECTED   Enterococcus Faecium NOT DETECTED NOT DETECTED   Listeria monocytogenes NOT DETECTED NOT DETECTED   Staphylococcus species DETECTED (A) NOT DETECTED   Staphylococcus aureus (BCID) DETECTED (A) NOT DETECTED   Staphylococcus epidermidis NOT DETECTED NOT DETECTED   Staphylococcus lugdunensis NOT DETECTED NOT DETECTED   Streptococcus species NOT DETECTED NOT DETECTED   Streptococcus agalactiae NOT DETECTED NOT DETECTED   Streptococcus pneumoniae NOT DETECTED NOT DETECTED   Streptococcus pyogenes NOT DETECTED NOT DETECTED   A.calcoaceticus-baumannii NOT DETECTED NOT DETECTED   Bacteroides fragilis NOT DETECTED NOT DETECTED   Enterobacterales NOT DETECTED NOT DETECTED   Enterobacter cloacae complex NOT DETECTED NOT DETECTED   Escherichia coli NOT DETECTED NOT DETECTED   Klebsiella aerogenes NOT DETECTED NOT DETECTED   Klebsiella oxytoca NOT DETECTED NOT DETECTED   Klebsiella pneumoniae NOT DETECTED NOT DETECTED   Proteus species NOT DETECTED NOT DETECTED   Salmonella species NOT DETECTED NOT DETECTED   Serratia marcescens NOT DETECTED NOT DETECTED   Haemophilus influenzae NOT DETECTED NOT DETECTED   Neisseria meningitidis NOT DETECTED NOT DETECTED    Pseudomonas aeruginosa DETECTED (A) NOT DETECTED   Stenotrophomonas maltophilia NOT DETECTED NOT DETECTED   Candida albicans NOT DETECTED NOT DETECTED   Candida auris NOT DETECTED NOT DETECTED   Candida glabrata NOT DETECTED NOT DETECTED   Candida krusei NOT DETECTED NOT DETECTED   Candida parapsilosis NOT DETECTED NOT DETECTED   Candida tropicalis NOT DETECTED NOT DETECTED   Cryptococcus neoformans/gattii NOT DETECTED NOT DETECTED   CTX-M ESBL NOT DETECTED NOT DETECTED   Carbapenem resistance IMP NOT DETECTED NOT DETECTED   Carbapenem resistance KPC NOT DETECTED NOT DETECTED   Meth resistant mecA/C and MREJ NOT DETECTED NOT DETECTED   Carbapenem resistance NDM NOT DETECTED NOT DETECTED   Carbapenem resistance VIM NOT DETECTED NOT DETECTED    Christoper Fabian, PharmD, BCPS Please see amion for complete clinical pharmacist phone list 04/26/2023  5:50 PM

## 2023-04-26 NOTE — Progress Notes (Signed)
NAME:  Madison Holden, MRN:  [id number], DOB:  Feb 13, 1946, LOS: 0 ADMISSION DATE:  04/26/2023, CONSULTATION DATE:  04/26/23 REFERRING MD:  EDP, CHIEF COMPLAINT:  hypoxia    History of Present Illness:  Madison Holden is a 77 y.o. F with PMH significant for ILD/pulmonary fibrosis, RA on Enbrel and MTX, PAH and R heart failure who presented to the ED with acutely worsening shortness of breath and hypoxia to 76% on home .  This was in the setting of about one week of minimal po intake, husband thinks that this was due to [place] which she resumed 6/23 after stopping it herself due to side effects.   She has been coughing up some phlegm with nasal congestion, but not significant amounts and denies fever, diarrhea or abdominal pain.  She required Bipap in the ED and CXR showed her chronic ILD with scattered airspace opacities, though not significantly changed from prior.   Labs were significant for lactic acid of 4.1, BNP 839, WBC 21.5, troponin 86.   She was hypotensive and required low dose levophed.   PCCM consulted for admission  Pertinent  Medical History   has a past medical history of Ambulates with cane, Anemia, Arthritis, Arthritis, Cancer, uterine (HCC), Chest pain, unspecified (07-01-2018  per pt had 2 day chest pain and sob  6 weeks ago,, per pt no symptoms since), Diarrhea, unspecified, DOE (dyspnea on exertion), Endometrial cancer (HCC), Essential hypertension, Frequency of urination, GERD (gastroesophageal reflux disease), Gout (rheumologist-- michelle young PA ([place] [facility])), Heart murmur, History of adenomatous polyp of colon, History of esophageal dilatation, Mild nausea, Mixed dyslipidemia, Muscle weakness of extremity, Poor appetite, Type 2 diabetes mellitus (HCC), Urgency of urination, and Urine frequency.   Significant Hospital Events: Including procedures, antibiotic start and stop dates in addition to other pertinent events   7/5 admit to PCCM on bipap and levophed   Interim History / Subjective:  Seen lying in bed slightly altered on low dose Precedex   Objective   Blood pressure (!) 92/43, pulse (!) 110, temperature 97.9 F (36.6 C), temperature source Axillary, resp. rate 19, weight 57.2 kg, SpO2 99 %.    Vent Mode: BIPAP;PCV FiO2 (%):  [50 %-70 %] 50 % Set Rate:  [15 bmp] 15 bmp PEEP:  [6 cmH20] 6 cmH20 Pressure Support:  [8 cmH20] 8 cmH20   Intake/Output Summary (Last 24 hours) at 04/26/2023 0656 Last data filed at 04/26/2023 4098 Gross per 24 hour  Intake 2302.07 ml  Output --  Net 2302.07 ml    Filed Weights   04/26/23 0500  Weight: 57.2 kg   General: Acute on chronic ill-appearing deconditioned frail elderly female lying in bed on BiPAP in no acute distress HEENT: BiPAP mask in place, MM pink/moist, PERRL,  Neuro: Will respond to name but appears disoriented on Precedex CV: s1s2 regular rate and rhythm, no murmur, rubs, or gallops,  PULM: Diminished air entry bilaterally, tachypnea, mild increased work of breathing on BiPAP GI: soft, bowel sounds active in all 4 quadrants, non-tender, non-distended Extremities: warm/dry, no edema  Skin: no rashes or lesions  Resolved Hospital Problem list     Assessment & Plan:   COVID infection with RLL pneumonia, POA Group 3 pulmonary hypertension/RV failure -Echo with preserved EF as below but moderate RV enlargement and severe pulmonary artery hypertension, followed by heart failure History of ILD likely secondary to RA -On Enbrel and methotrexate at baseline Acute on chronic hypoxic respiratory failure on 3L home O2  P: Continue BIPAP as needed  DNR/DNI per husband, see IPAL note 7/5  Supplemental oxygen as needed for stats greater than 90% Head of bed elevated 30 degrees Follow intermittent chest x-ray and ABG  Ensure adequate pulmonary hygiene  Follow cultures  Aspiration precautions  Continue steroids As needed bronchodilators  Will discuss with attending indication/need  for remdesivir  Severe septic shock secondary to PNA, POA  -CTA positive for RLL PNA in the setting for COVID -Procalcitonin 13.89 P: Supplemental oxygen as above  Follow cultures  Procal pending  Continue Azithromycin and Ceftriaxone, given elevated procalcitonin and baseline immunosuppression may need to escalate Aggressive IV hydration provided on admit  Continue pressors for MAP< 65  Trend lactic acid  HFpEF  -ECHO 12/2022 preserved EF with no WMA, and grade 1 diastolic dysfunction  Elevated troponin -Trop 86 - 105 P: Continuous telemetry  Heart health diet when off BIPAP Strict intake and output  Daily weight to assess volume status Daily assessment for need to diurese Closely monitor renal function and electrolytes  Family request heart failure team be consulted  AKI -Likely secondary to poor po intake and volume depletion -Baseline creatinine 0.9-1, creatinine on admit 1.50 P: Follow renal function  Monitor urine output Trend Bmet Avoid nephrotoxins Ensure adequate renal perfusion  IV hydration  Rheumatoid arthritis  -Home medications include Enbrel and Methotrexate  P: Home medications remains on hold   Goals of care See IPAL note 7/5   Best Practice (right click and "Reselect all SmartList Selections" daily)   Diet/type: NPO DVT prophylaxis: prophylactic heparin  GI prophylaxis: N/A Lines: N/A Foley:  N/A Code Status:  full code Last date of multidisciplinary goals of care discussion 7/5  Critical care time: 30 mins additional critical care time    Avier Jech D. Harris, NP-C [facility] Personal contact information can be found on Amion  If no contact or response made please call [phone] 04/26/2023, 6:59 AM

## 2023-04-27 ENCOUNTER — Inpatient Hospital Stay (HOSPITAL_COMMUNITY): Payer: Medicare Other

## 2023-04-27 DIAGNOSIS — R6521 Severe sepsis with septic shock: Secondary | ICD-10-CM

## 2023-04-27 DIAGNOSIS — J151 Pneumonia due to Pseudomonas: Secondary | ICD-10-CM

## 2023-04-27 DIAGNOSIS — J9622 Acute and chronic respiratory failure with hypercapnia: Secondary | ICD-10-CM

## 2023-04-27 DIAGNOSIS — J9621 Acute and chronic respiratory failure with hypoxia: Secondary | ICD-10-CM

## 2023-04-27 DIAGNOSIS — A419 Sepsis, unspecified organism: Secondary | ICD-10-CM

## 2023-04-27 DIAGNOSIS — J9601 Acute respiratory failure with hypoxia: Secondary | ICD-10-CM | POA: Diagnosis not present

## 2023-04-27 DIAGNOSIS — I272 Pulmonary hypertension, unspecified: Secondary | ICD-10-CM

## 2023-04-27 DIAGNOSIS — B9561 Methicillin susceptible Staphylococcus aureus infection as the cause of diseases classified elsewhere: Secondary | ICD-10-CM

## 2023-04-27 DIAGNOSIS — R578 Other shock: Secondary | ICD-10-CM | POA: Diagnosis not present

## 2023-04-27 DIAGNOSIS — A499 Bacterial infection, unspecified: Secondary | ICD-10-CM | POA: Diagnosis present

## 2023-04-27 DIAGNOSIS — U071 COVID-19: Secondary | ICD-10-CM | POA: Diagnosis not present

## 2023-04-27 LAB — MAGNESIUM: Magnesium: 1.9 mg/dL (ref 1.7–2.4)

## 2023-04-27 LAB — CBC
HCT: 32.3 % — ABNORMAL LOW (ref 36.0–46.0)
Hemoglobin: 10.4 g/dL — ABNORMAL LOW (ref 12.0–15.0)
MCH: 34 pg (ref 26.0–34.0)
MCHC: 32.2 g/dL (ref 30.0–36.0)
MCV: 105.6 fL — ABNORMAL HIGH (ref 80.0–100.0)
Platelets: 56 10*3/uL — ABNORMAL LOW (ref 150–400)
RBC: 3.06 MIL/uL — ABNORMAL LOW (ref 3.87–5.11)
RDW: 18 % — ABNORMAL HIGH (ref 11.5–15.5)
WBC: 32.1 10*3/uL — ABNORMAL HIGH (ref 4.0–10.5)
nRBC: 0.1 % (ref 0.0–0.2)

## 2023-04-27 LAB — POCT I-STAT 7, (LYTES, BLD GAS, ICA,H+H)
Acid-base deficit: 2 mmol/L (ref 0.0–2.0)
Bicarbonate: 24.4 mmol/L (ref 20.0–28.0)
Calcium, Ion: 1.3 mmol/L (ref 1.15–1.40)
HCT: 32 % — ABNORMAL LOW (ref 36.0–46.0)
Hemoglobin: 10.9 g/dL — ABNORMAL LOW (ref 12.0–15.0)
O2 Saturation: 93 %
Patient temperature: 96.7
Potassium: 3.3 mmol/L — ABNORMAL LOW (ref 3.5–5.1)
Sodium: 141 mmol/L (ref 135–145)
TCO2: 26 mmol/L (ref 22–32)
pCO2 arterial: 47.3 mmHg (ref 32–48)
pH, Arterial: 7.315 — ABNORMAL LOW (ref 7.35–7.45)
pO2, Arterial: 68 mmHg — ABNORMAL LOW (ref 83–108)

## 2023-04-27 LAB — GLUCOSE, CAPILLARY
Glucose-Capillary: 108 mg/dL — ABNORMAL HIGH (ref 70–99)
Glucose-Capillary: 120 mg/dL — ABNORMAL HIGH (ref 70–99)
Glucose-Capillary: 145 mg/dL — ABNORMAL HIGH (ref 70–99)
Glucose-Capillary: 147 mg/dL — ABNORMAL HIGH (ref 70–99)
Glucose-Capillary: 150 mg/dL — ABNORMAL HIGH (ref 70–99)
Glucose-Capillary: 151 mg/dL — ABNORMAL HIGH (ref 70–99)
Glucose-Capillary: 168 mg/dL — ABNORMAL HIGH (ref 70–99)
Glucose-Capillary: 171 mg/dL — ABNORMAL HIGH (ref 70–99)

## 2023-04-27 LAB — BASIC METABOLIC PANEL
Anion gap: 12 (ref 5–15)
BUN: 27 mg/dL — ABNORMAL HIGH (ref 8–23)
CO2: 23 mmol/L (ref 22–32)
Calcium: 8.6 mg/dL — ABNORMAL LOW (ref 8.9–10.3)
Chloride: 106 mmol/L (ref 98–111)
Creatinine, Ser: 0.57 mg/dL (ref 0.44–1.00)
GFR, Estimated: >60 mL/min (ref >60)
Glucose, Bld: 157 mg/dL — ABNORMAL HIGH (ref 70–99)
Potassium: 3.4 mmol/L — ABNORMAL LOW (ref 3.5–5.1)
Sodium: 141 mmol/L (ref 135–145)

## 2023-04-27 LAB — ECHOCARDIOGRAM COMPLETE
AR max vel: 1.89 cm2
AV Area VTI: 2 cm2
AV Area mean vel: 1.94 cm2
AV Mean grad: 5 mmHg
AV Peak grad: 9.8 mmHg
Ao pk vel: 1.57 m/s
Area-P 1/2: 2.86 cm2
MV VTI: 1.57 cm2
P 1/2 time: 571 msec
S' Lateral: 2.3 cm
Weight: 2017.65 oz

## 2023-04-27 LAB — CULTURE, BLOOD (ROUTINE X 2): Special Requests: ADEQUATE

## 2023-04-27 LAB — PHOSPHORUS: Phosphorus: 1.5 mg/dL — ABNORMAL LOW (ref 2.5–4.6)

## 2023-04-27 MED ORDER — ENOXAPARIN SODIUM 40 MG/0.4ML IJ SOSY
40.0000 mg | PREFILLED_SYRINGE | INTRAMUSCULAR | Status: DC
  Administered 2023-04-27 – 2023-05-03 (×7): 40 mg via SUBCUTANEOUS
  Filled 2023-04-27 (×8): qty 0.4

## 2023-04-27 MED ORDER — MAGNESIUM SULFATE 2 GM/50ML IV SOLN
2.0000 g | Freq: Once | INTRAVENOUS | Status: AC
  Administered 2023-04-27: 2 g via INTRAVENOUS
  Filled 2023-04-27: qty 50

## 2023-04-27 MED ORDER — POTASSIUM PHOSPHATES 15 MMOLE/5ML IV SOLN
30.0000 mmol | Freq: Once | INTRAVENOUS | Status: AC
  Administered 2023-04-27: 30 mmol via INTRAVENOUS
  Filled 2023-04-27: qty 10

## 2023-04-27 NOTE — Progress Notes (Signed)
Echocardiogram 2D Echocardiogram has been performed.  Madison Holden 04/27/2023, 11:16 AM

## 2023-04-27 NOTE — Progress Notes (Addendum)
NAME:  Madison Holden, MRN:  [id number], DOB:  [birth year], LOS: 1 ADMISSION DATE:  04/26/2023, CONSULTATION DATE:  04/27/23 REFERRING MD:  EDP, CHIEF COMPLAINT:  hypoxia    History of Present Illness:  Madison Holden is a 77 y.o. F with PMH significant for ILD/pulmonary fibrosis, RA on Enbrel and MTX, PAH and R heart failure who presented to the ED with acutely worsening shortness of breath and hypoxia to 76% on home [place].  This was in the setting of about one week of minimal po intake, husband thinks that this was due to [place] which she resumed 6/23 after stopping it herself due to side effects.   She has been coughing up some phlegm with nasal congestion, but not significant amounts and denies fever, diarrhea or abdominal pain.  She required Bipap in the ED and CXR showed her chronic ILD with scattered airspace opacities, though not significantly changed from prior.   Labs were significant for lactic acid of 4.1, BNP 839, WBC 21.5, troponin 86.   She was hypotensive and required low dose levophed.   PCCM consulted for admission  Pertinent  Medical History   has a past medical history of Ambulates with cane, Anemia, Arthritis, Arthritis, Cancer, uterine (HCC), Chest pain, unspecified (07-01-2018  per pt had 2 day chest pain and sob  6 weeks ago,, per pt no symptoms since), Diarrhea, unspecified, DOE (dyspnea on exertion), Endometrial cancer (HCC), Essential hypertension, Frequency of urination, GERD (gastroesophageal reflux disease), Gout (rheumologist-- michelle young PA ([place] [facility])), Heart murmur, History of adenomatous polyp of colon, History of esophageal dilatation, Mild nausea, Mixed dyslipidemia, Muscle weakness of extremity, Poor appetite, Type 2 diabetes mellitus (HCC), Urgency of urination, and Urine frequency.   Significant Hospital Events: Including procedures, antibiotic start and stop dates in addition to other pertinent events   7/5 admit to PCCM on bipap and levophed   Interim History / Subjective:  Patient remained on BiPAP Continue to require Precedex infusion Blood culture is growing MSSA/Pseudomonas Remain afebrile  Objective   Blood pressure (!) 140/76, pulse 77, temperature (!) 96.7 F (35.9 C), temperature source Axillary, resp. rate 20, weight 57.2 kg, SpO2 96 %.    Vent Mode: PCV;BIPAP FiO2 (%):  [30 %-40 %] 40 % Set Rate:  [15 bmp] 15 bmp PEEP:  [6 cmH20] 6 cmH20   Intake/Output Summary (Last 24 hours) at 04/27/2023 0844 Last data filed at 04/27/2023 0700 Gross per 24 hour  Intake 1706 ml  Output 550 ml  Net 1156 ml   Filed Weights   04/26/23 0500 04/27/23 0549  Weight: 57.2 kg 57.2 kg     Physical exam: General: Crtitically ill-appearing female, on Bipap HEENT: [place]/AT, eyes anicteric.  Dry mucous membranes Neuro: Eyes closed, not following commands, restless Chest: Air entry at the bases bilaterally no wheezes or rhonchi Heart: Regular rate and rhythm, no murmurs or gallops Abdomen: Soft, nondistended, bowel sounds present Skin: No rash  Labs and images were reviewed  Resolved Hospital Problem list     Assessment & Plan:  Acute on chronic hypoxic/hypercapnic respiratory failure on 3L home O2  COVID-19 pneumonia Probable superimposed bacterial pneumonia ILD likely secondary to RA On Enbrel and methotrexate at baseline, on hold for now Continue BIPAP, titrate transition to high flow nasal cannula oxygen, O2 sat goal 92% DNR/DNI confirmed by her husband Head of bed elevated 30 degrees Hypercapnia has cleared Continue steroid to treat COVID-pneumonia Hold immunosuppressive therapy Continue IV cefepime Aspiration precautions  Severe septic  shock secondary to PNA, POA  MSSA/Pseudomonas bacteremia CTA positive for RLL PNA in the setting for COVID Procalcitonin 13.89 Patient blood cultures came back positive for MSSA and Pseudomonas Repeat blood cultures are pending Continue IV cefepime Infectious disease  consulted Continue vasopressor support with Levophed, MAP goal 65 Trend lactate  Chronic HFpEF with pulmonary hypertension Demand cardiac ischemia Patient looks euvolemic Monitor intake and output Hold GDMT in the setting of shock  AKI, improved Hypokalemia/hypophosphatemia Patient presented with serum creatinine of 1.5, now improved back to her baseline 0.5 Monitor intake and output Avoid nephrotoxic agent Continue aggressive electrolyte supplement  Mixed metabolic/respiratory acidosis, improved  Best Practice (right click and "Reselect all SmartList Selections" daily)   Diet/type: NPO, speech and swallow evaluation after coming off of BiPAP DVT prophylaxis: Subcu Lovenox GI prophylaxis: N/A Lines: N/A Foley:  N/A Code Status: DNR/DNI Last date of multidisciplinary goals of care discussion 7/6   The patient is critically ill due to acute on chronic hypoxic/hypercapnic respiratory failure/septic shock critical care was necessary to treat or prevent imminent or life-threatening deterioration.  Critical care was time spent personally by me on the following activities: development of treatment plan with patient and/or surrogate as well as nursing, discussions with consultants, evaluation of patient's response to treatment, examination of patient, obtaining history from patient or surrogate, ordering and performing treatments and interventions, ordering and review of laboratory studies, ordering and review of radiographic studies, pulse oximetry, re-evaluation of patient's condition and participation in multidisciplinary rounds.   During this encounter critical care time was devoted to patient care services described in this note for 39 minutes.     Cheri Fowler, MD [facility] See Amion for pager If no response to pager, please call [phone] until 7pm After 7pm, Please call E-link [phone]

## 2023-04-27 NOTE — Consult Note (Signed)
Date of Admission:  04/26/2023          Reason for Consult: Polymicrobial bacteremia with MSSA and Pseudomonas aeruginosa likely due to bacterial pneumonia superimposed upon COVID-19 infection Referring Provider: Connye Burkitt "auto consult" and Dr. 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 on chronic oxygen at home, currently requiring BiPAP Pulmonary arterial hypertension Septic shock requiring vasopressors  Plan:  Continue cefepime Repeat blood cultures Sensitivities in particular with a high 2 pseudomonal sensitivities 2D echocardiogram is ordered Defer rx of COVID to CCM  Principal Problem:   Polymicrobial bacterial infection Active Problems:   Respiratory failure (HCC)   COVID-19 virus infection   PAH (pulmonary artery hypertension) (HCC)   Septic shock (HCC)   Scheduled Meds:  arformoterol  15 mcg Nebulization BID   budesonide (PULMICORT) nebulizer solution  0.25 mg Nebulization BID   Chlorhexidine Gluconate Cloth  6 each Topical Q0600   enoxaparin (LOVENOX) injection  40 mg Subcutaneous Q24H   insulin aspart  0-15 Units Subcutaneous Q4H   methylPREDNISolone (SOLU-MEDROL) injection  120 mg Intravenous Q24H   revefenacin  175 mcg Nebulization Daily   Continuous Infusions:  sodium chloride 10 mL/hr at 04/27/23 1000   ceFEPime (MAXIPIME) IV Stopped (04/27/23 0100)   dexmedetomidine (PRECEDEX) IV infusion 0.5 mcg/kg/hr (04/27/23 1000)   magnesium sulfate bolus IVPB     norepinephrine (LEVOPHED) Adult infusion 6 mcg/min (04/27/23 1000)   potassium PHOSPHATE IVPB (in mmol)     PRN Meds:.docusate sodium, morphine injection, polyethylene glycol  HPI: Madison Holden is a 77 y.o. female with past medical history significant for interstitial lung disease, pulmonary  fibrosis rheumatoid arthritis on Enbrel and methotrexate, right heart failure who presented to ER with worsening shortness of breath and hypoxia to 76% on home nasal cannula.  Apparently she is felt poorly for at least a week or 2 with weakness and poor oral intake apparently on route to the ER her blood sugars were in the 40s when EMS did fingerstick check.  Been coughing up phlegm and was having nasal congestion.  In ER she required BiPAP and chest x-ray showed her chronic ILD with scattered bilateral airspace opacities.CT angiogram showed right lower lobe infiltrate.   COVID-19 testing was positive and blood cultures were sent which have subsequently turned positive for MSSA and Pseudomonas.  The patient is currently on cefepime.  She has required low-dose Levophed. Is also been placed on steroids  We will continue cefepime I have ordered a 2D echocardiogram.  I think a transesophageal echo echocardiogram would be hazardous with her given her underlying lung disease.  I would favor treating her empirically with a minimum of 4 if not 6 weeks of into an IV antibiotics.  Certainly her immunosuppressive drugs need to be withheld and I would not recommend resuming Enbrel anytime soon.  I have personally spent involved in face-to-face and non-face-to-face activities for this patient on the day of the visit. Professional time spent includes the following activities: Preparing to see the patient (review of tests), Obtaining and/or reviewing separately obtained history (admission/discharge record), Performing a medically appropriate examination and/or evaluation , Ordering medications/tests/procedures, referring and communicating with other health care professionals, Documenting clinical information in the EMR, Independently interpreting results (not separately reported), Communicating results to the patient/family/caregiver, Counseling and educating the patient/family/caregiver and Care  coordination (  not separately reported).    Review of Systems: Review of Systems  Unable to perform ROS: Critical illness    Past Medical History:  Diagnosis Date   Ambulates with cane    due to gout    Anemia    Arthritis    hands   Arthritis    Cancer, uterine (HCC)    Chest pain, unspecified 07-01-2018  per pt had 2 day chest pain and sob  6 weeks ago,, per pt no symptoms since   cardiology-- dr Consuello Bossier--  scheduled for stress test and echo 07-03-2018 prior to surgery scheduled 07-08-2018   Diarrhea, unspecified    intermittant   DOE (dyspnea on exertion)    Endometrial cancer (HCC)    Essential hypertension    Frequency of urination    GERD (gastroesophageal reflux disease)    Gout rheumologist-- michelle young PA ([facility])   07-01-2018 per pt flare up april 2019 , hands and wrists   Heart murmur    History of adenomatous polyp of colon    History of esophageal dilatation    for stricture   Mild nausea    per pt intermittant , no vomiting   Mixed dyslipidemia    Muscle weakness of extremity    bilateral upper and lower extremities due to gout   Poor appetite    per pt    Type 2 diabetes mellitus (HCC)    followed by pcp   Urgency of urination    Urine frequency     Social History   Tobacco Use   Smoking status: Never   Smokeless tobacco: Never  Vaping Use   Vaping Use: Never used  Substance Use Topics   Alcohol use: Not Currently    Comment: 2-3 per  week, not currently drinking since Apri; 2019   Drug use: No    Family History  Problem Relation Age of Onset   Heart disease Mother    Heart failure Mother    Heart disease Father    Prostate cancer Father    Clotting disorder Father    Diabetes Maternal Grandfather    Colon cancer Neg Hx    Liver disease Neg Hx    Esophageal cancer Neg Hx    Cancer - Colon Neg Hx    Allergies  Allergen Reactions   Lovastatin     myalgia    OBJECTIVE: Blood pressure 123/77, pulse 77,  temperature (!) 96.7 F (35.9 C), temperature source Axillary, resp. rate 20, weight 57.2 kg, SpO2 93 %.  Physical Exam Constitutional:      General: She is not in acute distress.    Appearance: Normal appearance. She is well-developed. She is obese. She is ill-appearing. She is not diaphoretic.  HENT:     Head: Normocephalic and atraumatic.     Right Ear: Hearing and external ear normal.     Left Ear: Hearing and external ear normal.     Nose: No nasal deformity or rhinorrhea.  Eyes:     General: No scleral icterus.    Conjunctiva/sclera: Conjunctivae normal.     Right eye: Right conjunctiva is not injected.     Left eye: Left conjunctiva is not injected.     Pupils: Pupils are equal, round, and reactive to light.  Neck:     Vascular: No JVD.  Cardiovascular:     Rate and Rhythm: Regular rhythm. Tachycardia present.     Heart sounds: S1 normal and S2 normal.  Pulmonary:  Comments: On BIPAP Abdominal:     General: There is no distension.     Palpations: Abdomen is soft.  Musculoskeletal:     Right shoulder: Normal.     Left shoulder: Normal.     Cervical back: Normal range of motion and neck supple.     Right hip: Normal.     Left hip: Normal.     Right knee: Normal.     Left knee: Normal.  Lymphadenopathy:     Head:     Right side of head: No submandibular, preauricular or posterior auricular adenopathy.     Left side of head: No submandibular, preauricular or posterior auricular adenopathy.     Cervical:     Right cervical: No superficial or deep cervical adenopathy.    Left cervical: No superficial or deep cervical adenopathy.  Skin:    General: Skin is warm and dry.     Coloration: Skin is not pale.     Findings: No abrasion, bruising, ecchymosis, erythema or lesion.     Nails: There is no clubbing.  Psychiatric:        Attention and Perception: She is attentive.        Speech: Speech normal.        Behavior: Behavior is cooperative.    Patient was not  conversant as apparently she is on some sedation and she is also on the BiPAP so most of the information obtained was through conversations with her caretaker and husband  Lab Results Lab Results  Component Value Date   WBC 32.1 (H) 04/27/2023   HGB 10.9 (L) 04/27/2023   HCT 32.0 (L) 04/27/2023   MCV 105.6 (H) 04/27/2023   PLT 56 (L) 04/27/2023    Lab Results  Component Value Date   CREATININE 0.57 04/27/2023   BUN 27 (H) 04/27/2023   NA 141 04/27/2023   K 3.3 (L) 04/27/2023   CL 106 04/27/2023   CO2 23 04/27/2023    Lab Results  Component Value Date   ALT 13 04/26/2023   AST 31 04/26/2023   ALKPHOS 87 04/26/2023   BILITOT 2.8 (H) 04/26/2023     Microbiology: Recent Results (from the past 240 hour(s))  Culture, blood (Routine X 2) w Reflex to ID Panel     Status: Abnormal (Preliminary result)   Collection Time: 04/26/23  1:20 AM   Specimen: BLOOD  Result Value Ref Range Status   Specimen Description BLOOD SITE NOT SPECIFIED  Final   Special Requests   Final    BOTTLES DRAWN AEROBIC AND ANAEROBIC Blood Culture adequate volume   Culture  Setup Time   Final    GRAM POSITIVE COCCI GRAM NEGATIVE RODS IN BOTH AEROBIC AND ANAEROBIC BOTTLES CRITICAL RESULT CALLED TO, READ BACK BY AND VERIFIED WITH: PHARMD CAREN AMEND ON 04/26/23 @ 1737 BY DRT    Culture (A)  Final    PSEUDOMONAS AERUGINOSA STAPHYLOCOCCUS AUREUS SUSCEPTIBILITIES TO FOLLOW Performed at [facility], [address], [place], [place] [address]    Report Status PENDING  Incomplete  Blood Culture ID Panel (Reflexed)     Status: Abnormal   Collection Time: 04/26/23  1:20 AM  Result Value Ref Range Status   Enterococcus faecalis NOT DETECTED NOT DETECTED Final   Enterococcus Faecium NOT DETECTED NOT DETECTED Final   Listeria monocytogenes NOT DETECTED NOT DETECTED Final   Staphylococcus species DETECTED (A) NOT DETECTED Final    Comment: CRITICAL RESULT CALLED TO, READ BACK BY AND VERIFIED  WITH: PHARMD  CAREN AMEND ON 04/26/23 @ 1737 BY DRT    Staphylococcus aureus (BCID) DETECTED (A) NOT DETECTED Final    Comment: CRITICAL RESULT CALLED TO, READ BACK BY AND VERIFIED WITH: PHARMD CAREN AMEND ON 04/26/23 @ 1737 BY DRT    Staphylococcus epidermidis NOT DETECTED NOT DETECTED Final   Staphylococcus lugdunensis NOT DETECTED NOT DETECTED Final   Streptococcus species NOT DETECTED NOT DETECTED Final   Streptococcus agalactiae NOT DETECTED NOT DETECTED Final   Streptococcus pneumoniae NOT DETECTED NOT DETECTED Final   Streptococcus pyogenes NOT DETECTED NOT DETECTED Final   A.calcoaceticus-baumannii NOT DETECTED NOT DETECTED Final   Bacteroides fragilis NOT DETECTED NOT DETECTED Final   Enterobacterales NOT DETECTED NOT DETECTED Final   Enterobacter cloacae complex NOT DETECTED NOT DETECTED Final   Escherichia coli NOT DETECTED NOT DETECTED Final   Klebsiella aerogenes NOT DETECTED NOT DETECTED Final   Klebsiella oxytoca NOT DETECTED NOT DETECTED Final   Klebsiella pneumoniae NOT DETECTED NOT DETECTED Final   Proteus species NOT DETECTED NOT DETECTED Final   Salmonella species NOT DETECTED NOT DETECTED Final   Serratia marcescens NOT DETECTED NOT DETECTED Final   Haemophilus influenzae NOT DETECTED NOT DETECTED Final   Neisseria meningitidis NOT DETECTED NOT DETECTED Final   Pseudomonas aeruginosa DETECTED (A) NOT DETECTED Final    Comment: CRITICAL RESULT CALLED TO, READ BACK BY AND VERIFIED WITH: PHARMD CAREN AMEND ON 04/26/23 @ 1737 BY DRT    Stenotrophomonas maltophilia NOT DETECTED NOT DETECTED Final   Candida albicans NOT DETECTED NOT DETECTED Final   Candida auris NOT DETECTED NOT DETECTED Final   Candida glabrata NOT DETECTED NOT DETECTED Final   Candida krusei NOT DETECTED NOT DETECTED Final   Candida parapsilosis NOT DETECTED NOT DETECTED Final   Candida tropicalis NOT DETECTED NOT DETECTED Final   Cryptococcus neoformans/gattii NOT DETECTED NOT DETECTED Final   CTX-M ESBL NOT  DETECTED NOT DETECTED Final   Carbapenem resistance IMP NOT DETECTED NOT DETECTED Final   Carbapenem resistance KPC NOT DETECTED NOT DETECTED Final   Meth resistant mecA/C and MREJ NOT DETECTED NOT DETECTED Final   Carbapenem resistance NDM NOT DETECTED NOT DETECTED Final   Carbapenem resistance VIM NOT DETECTED NOT DETECTED Final    Comment: Performed at [facility], [address], [place], [place] [address]  Culture, blood (Routine X 2) w Reflex to ID Panel     Status: Abnormal (Preliminary result)   Collection Time: 04/26/23  1:50 AM   Specimen: BLOOD LEFT ARM  Result Value Ref Range Status   Specimen Description BLOOD LEFT ARM  Final   Special Requests   Final    BOTTLES DRAWN AEROBIC AND ANAEROBIC Blood Culture adequate volume   Culture  Setup Time   Final    GRAM POSITIVE COCCI GRAM NEGATIVE RODS CRITICAL RESULT CALLED TO, READ BACK BY AND VERIFIED WITH: PHARMD CAREN AMEND ON 04/26/23 @ 1737 BY DRT Performed at [facility], [address], [place], [place] [address]    Culture PSEUDOMONAS AERUGINOSA STAPHYLOCOCCUS AUREUS  (A)  Final   Report Status PENDING  Incomplete  Respiratory (~20 pathogens) panel by PCR     Status: None   Collection Time: 04/26/23  4:11 AM   Specimen: Anterior Nasal Swab; Respiratory  Result Value Ref Range Status   Adenovirus NOT DETECTED NOT DETECTED Final   Coronavirus 229E NOT DETECTED NOT DETECTED Final    Comment: (NOTE) The Coronavirus on the Respiratory Panel, DOES NOT  test for the novel  Coronavirus (2019 nCoV)    Coronavirus HKU1 NOT DETECTED NOT DETECTED Final   Coronavirus NL63 NOT DETECTED NOT DETECTED Final   Coronavirus OC43 NOT DETECTED NOT DETECTED Final   Metapneumovirus NOT DETECTED NOT DETECTED Final   Rhinovirus / Enterovirus NOT DETECTED NOT DETECTED Final   Influenza A NOT DETECTED NOT DETECTED Final   Influenza B NOT DETECTED NOT DETECTED Final   Parainfluenza Virus 1 NOT DETECTED NOT DETECTED Final    Parainfluenza Virus 2 NOT DETECTED NOT DETECTED Final   Parainfluenza Virus 3 NOT DETECTED NOT DETECTED Final   Parainfluenza Virus 4 NOT DETECTED NOT DETECTED Final   Respiratory Syncytial Virus NOT DETECTED NOT DETECTED Final   Bordetella pertussis NOT DETECTED NOT DETECTED Final   Bordetella Parapertussis NOT DETECTED NOT DETECTED Final   Chlamydophila pneumoniae NOT DETECTED NOT DETECTED Final   Mycoplasma pneumoniae NOT DETECTED NOT DETECTED Final    Comment: Performed at [facility], [address] [place], [place] [address]  SARS Coronavirus 2 by RT PCR (hospital order, performed in [facility] hospital lab) *cepheid single result test* Anterior Nasal Swab     Status: Abnormal   Collection Time: 04/26/23  4:11 AM   Specimen: Anterior Nasal Swab  Result Value Ref Range Status   SARS Coronavirus 2 by RT PCR POSITIVE (A) NEGATIVE Final    Comment: Performed at [facility], [address], [place], [place] [address]  MRSA Next Gen by PCR, Nasal     Status: None   Collection Time: 04/26/23  5:08 AM   Specimen: Nasal Mucosa; Nasal Swab  Result Value Ref Range Status   MRSA by PCR Next Gen NOT DETECTED NOT DETECTED Final    Comment: (NOTE) The GeneXpert MRSA Assay (FDA approved for NASAL specimens only), is one component of a comprehensive MRSA colonization surveillance program. It is not intended to diagnose MRSA infection nor to guide or monitor treatment for MRSA infections. Test performance is not FDA approved in patients less than 25 years old. Performed at [facility], [address], [place], [place] [address]     Acey Lav, MD [facility] for Infectious Disease [facility] [phone] pager  04/27/2023, 11:56 AM

## 2023-04-28 DIAGNOSIS — J9621 Acute and chronic respiratory failure with hypoxia: Secondary | ICD-10-CM | POA: Diagnosis not present

## 2023-04-28 DIAGNOSIS — A499 Bacterial infection, unspecified: Secondary | ICD-10-CM

## 2023-04-28 DIAGNOSIS — I2721 Secondary pulmonary arterial hypertension: Secondary | ICD-10-CM

## 2023-04-28 DIAGNOSIS — U071 COVID-19: Secondary | ICD-10-CM

## 2023-04-28 DIAGNOSIS — A419 Sepsis, unspecified organism: Secondary | ICD-10-CM | POA: Diagnosis not present

## 2023-04-28 DIAGNOSIS — J151 Pneumonia due to Pseudomonas: Secondary | ICD-10-CM | POA: Diagnosis not present

## 2023-04-28 DIAGNOSIS — J9601 Acute respiratory failure with hypoxia: Secondary | ICD-10-CM | POA: Diagnosis not present

## 2023-04-28 DIAGNOSIS — B9561 Methicillin susceptible Staphylococcus aureus infection as the cause of diseases classified elsewhere: Secondary | ICD-10-CM | POA: Diagnosis not present

## 2023-04-28 LAB — CBC WITH DIFFERENTIAL/PLATELET
Abs Immature Granulocytes: 2.18 10*3/uL — ABNORMAL HIGH (ref 0.00–0.07)
Basophils Absolute: 0.1 10*3/uL (ref 0.0–0.1)
Basophils Relative: 0 %
Eosinophils Absolute: 0.2 10*3/uL (ref 0.0–0.5)
Eosinophils Relative: 1 %
HCT: 30.1 % — ABNORMAL LOW (ref 36.0–46.0)
Hemoglobin: 9.9 g/dL — ABNORMAL LOW (ref 12.0–15.0)
Immature Granulocytes: 10 %
Lymphocytes Relative: 3 %
Lymphs Abs: 0.7 10*3/uL (ref 0.7–4.0)
MCH: 33.8 pg (ref 26.0–34.0)
MCHC: 32.9 g/dL (ref 30.0–36.0)
MCV: 102.7 fL — ABNORMAL HIGH (ref 80.0–100.0)
Monocytes Absolute: 0.7 10*3/uL (ref 0.1–1.0)
Monocytes Relative: 3 %
Neutro Abs: 17.8 10*3/uL — ABNORMAL HIGH (ref 1.7–7.7)
Neutrophils Relative %: 83 %
Platelets: 70 10*3/uL — ABNORMAL LOW (ref 150–400)
RBC: 2.93 MIL/uL — ABNORMAL LOW (ref 3.87–5.11)
RDW: 18.2 % — ABNORMAL HIGH (ref 11.5–15.5)
WBC: 21.6 10*3/uL — ABNORMAL HIGH (ref 4.0–10.5)
nRBC: 0 % (ref 0.0–0.2)

## 2023-04-28 LAB — MAGNESIUM: Magnesium: 2 mg/dL (ref 1.7–2.4)

## 2023-04-28 LAB — COMPREHENSIVE METABOLIC PANEL
ALT: 12 U/L (ref 0–44)
AST: 25 U/L (ref 15–41)
Albumin: 2.5 g/dL — ABNORMAL LOW (ref 3.5–5.0)
Alkaline Phosphatase: 87 U/L (ref 38–126)
Anion gap: 13 (ref 5–15)
BUN: 19 mg/dL (ref 8–23)
CO2: 23 mmol/L (ref 22–32)
Calcium: 8.5 mg/dL — ABNORMAL LOW (ref 8.9–10.3)
Chloride: 105 mmol/L (ref 98–111)
Creatinine, Ser: 0.61 mg/dL (ref 0.44–1.00)
GFR, Estimated: >60 mL/min (ref >60)
Glucose, Bld: 129 mg/dL — ABNORMAL HIGH (ref 70–99)
Potassium: 3.6 mmol/L (ref 3.5–5.1)
Sodium: 141 mmol/L (ref 135–145)
Total Bilirubin: 2 mg/dL — ABNORMAL HIGH (ref 0.3–1.2)
Total Protein: 5.1 g/dL — ABNORMAL LOW (ref 6.5–8.1)

## 2023-04-28 LAB — GLUCOSE, CAPILLARY
Glucose-Capillary: 140 mg/dL — ABNORMAL HIGH (ref 70–99)
Glucose-Capillary: 117 mg/dL — ABNORMAL HIGH (ref 70–99)
Glucose-Capillary: 111 mg/dL — ABNORMAL HIGH (ref 70–99)
Glucose-Capillary: 164 mg/dL — ABNORMAL HIGH (ref 70–99)
Glucose-Capillary: 124 mg/dL — ABNORMAL HIGH (ref 70–99)

## 2023-04-28 LAB — PHOSPHORUS: Phosphorus: 2.3 mg/dL — ABNORMAL LOW (ref 2.5–4.6)

## 2023-04-28 MED ORDER — HALOPERIDOL LACTATE 5 MG/ML IJ SOLN
1.0000 mg | Freq: Once | INTRAMUSCULAR | Status: DC
  Filled 2023-04-28: qty 1

## 2023-04-28 MED ORDER — POTASSIUM PHOSPHATES 15 MMOLE/5ML IV SOLN
15.0000 mmol | Freq: Once | INTRAVENOUS | Status: AC
  Administered 2023-04-28: 15 mmol via INTRAVENOUS
  Filled 2023-04-28: qty 5

## 2023-04-28 NOTE — Progress Notes (Signed)
Subjective:  Madison Holden has bruising left forearm   Antibiotics:  Anti-infectives (From admission, onward)    Start     Dose/Rate Route Frequency Ordered Stop   04/26/23 2200  cefTRIAXone (ROCEPHIN) 1 g in sodium chloride 0.9 % 100 mL IVPB  Status:  Discontinued        1 g 200 mL/hr over 30 Minutes Intravenous Every 24 hours 04/26/23 0348 04/26/23 1107   04/26/23 1215  ceFEPIme (MAXIPIME) 2 g in sodium chloride 0.9 % 100 mL IVPB        2 g 200 mL/hr over 30 Minutes Intravenous Every 12 hours 04/26/23 1118     04/26/23 0500  azithromycin (ZITHROMAX) 500 mg in sodium chloride 0.9 % 250 mL IVPB  Status:  Discontinued        500 mg 250 mL/hr over 60 Minutes Intravenous Daily 04/26/23 0348 04/26/23 1107   04/26/23 0130  cefTRIAXone (ROCEPHIN) 2 g in sodium chloride 0.9 % 100 mL IVPB        2 g 200 mL/hr over 30 Minutes Intravenous  Once 04/26/23 0118 04/26/23 0215       Medications: Scheduled Meds:  arformoterol  15 mcg Nebulization BID   budesonide (PULMICORT) nebulizer solution  0.25 mg Nebulization BID   Chlorhexidine Gluconate Cloth  6 each Topical Q0600   enoxaparin (LOVENOX) injection  40 mg Subcutaneous Q24H   haloperidol lactate  1 mg Intravenous Once   insulin aspart  0-15 Units Subcutaneous Q4H   methylPREDNISolone (SOLU-MEDROL) injection  120 mg Intravenous Q24H   revefenacin  175 mcg Nebulization Daily   Continuous Infusions:  sodium chloride Stopped (04/27/23 1426)   ceFEPime (MAXIPIME) IV 2 g (04/28/23 1325)   dexmedetomidine (PRECEDEX) IV infusion Stopped (04/28/23 0856)   norepinephrine (LEVOPHED) Adult infusion 1 mcg/min (04/28/23 1200)   potassium PHOSPHATE IVPB (in mmol) 15 mmol (04/28/23 1321)   PRN Meds:.docusate sodium, morphine injection, polyethylene glycol    Objective: Weight change: 1.5 kg  Intake/Output Summary (Last 24 hours) at 04/28/2023 1411 Last data filed at 04/28/2023 1200 Gross per 24 hour  Intake 990.16 ml  Output 450 ml  Net  540.16 ml   Blood pressure 106/61, pulse 92, temperature 98.7 F (37.1 C), temperature source Oral, resp. rate (!) 27, weight 58.7 kg, SpO2 96 %. Temp:  [96.3 F (35.7 C)-98.7 F (37.1 C)] 98.7 F (37.1 C) (07/07 1120) Pulse Rate:  [53-92] 92 (07/07 1346) Resp:  [14-37] 27 (07/07 1346) BP: (85-155)/(43-119) 106/61 (07/07 1300) SpO2:  [93 %-100 %] 96 % (07/07 1346) FiO2 (%):  [40 %-95 %] 73 % (07/07 1346) Weight:  [58.7 kg] 58.7 kg (07/07 0618)  Physical Exam: Physical Exam Constitutional:      Appearance: Madison Holden is obese. Madison Holden is ill-appearing.  HENT:     Head: Normocephalic and atraumatic.  Cardiovascular:     Rate and Rhythm: Regular rhythm. Tachycardia present.  Pulmonary:     Comments: On high flow o2 Abdominal:     General: There is no distension.  Skin:    General: Skin is warm and dry.     Findings: Bruising present.  Neurological:     General: No focal deficit present.     Mental Status: Madison Holden is alert and oriented to person, place, and time.  Psychiatric:        Attention and Perception: Attention normal.        Mood and Affect: Mood normal.  Speech: Speech normal.        Behavior: Behavior normal.        Cognition and Memory: Cognition and memory normal.        Judgment: Judgment normal.      CBC:    BMET Recent Labs    04/27/23 0757 04/27/23 0910 04/28/23 1000  NA 141 141 141  K 3.4* 3.3* 3.6  CL 106  --  105  CO2 23  --  23  GLUCOSE 157*  --  129*  BUN 27*  --  19  CREATININE 0.57  --  0.61  CALCIUM 8.6*  --  8.5*     Liver Panel  Recent Labs    04/26/23 0120 04/28/23 1000  PROT 6.4* 5.1*  ALBUMIN 3.3* 2.5*  AST 31 25  ALT 13 12  ALKPHOS 87 87  BILITOT 2.8* 2.0*       Sedimentation Rate No results for input(s): "ESRSEDRATE" in the last 72 hours. C-Reactive Protein No results for input(s): "CRP" in the last 72 hours.  Micro Results: Recent Results (from the past 720 hour(s))  Culture, blood (Routine X 2) w Reflex to ID  Panel     Status: Abnormal (Preliminary result)   Collection Time: 04/26/23  1:20 AM   Specimen: BLOOD  Result Value Ref Range Status   Specimen Description BLOOD SITE NOT SPECIFIED  Final   Special Requests   Final    BOTTLES DRAWN AEROBIC AND ANAEROBIC Blood Culture adequate volume   Culture  Setup Time   Final    GRAM POSITIVE COCCI GRAM NEGATIVE RODS IN BOTH AEROBIC AND ANAEROBIC BOTTLES CRITICAL RESULT CALLED TO, READ BACK BY AND VERIFIED WITH: PHARMD CAREN AMEND ON 04/26/23 @ 1737 BY DRT    Culture (A)  Final    PSEUDOMONAS AERUGINOSA STAPHYLOCOCCUS AUREUS SUSCEPTIBILITIES TO FOLLOW    Report Status PENDING  Incomplete   Organism ID, Bacteria PSEUDOMONAS AERUGINOSA  Final      Susceptibility   Pseudomonas aeruginosa - MIC*    CEFTAZIDIME 4 SENSITIVE Sensitive     CIPROFLOXACIN <=0.25 SENSITIVE Sensitive     GENTAMICIN <=1 SENSITIVE Sensitive     IMIPENEM 1 SENSITIVE Sensitive     PIP/TAZO 8 SENSITIVE Sensitive     CEFEPIME Value in next row Sensitive      2 SENSITIVEPerformed at [facility], [address], [place], [place] [address]    * PSEUDOMONAS AERUGINOSA  Blood Culture ID Panel (Reflexed)     Status: Abnormal   Collection Time: 04/26/23  1:20 AM  Result Value Ref Range Status   Enterococcus faecalis NOT DETECTED NOT DETECTED Final   Enterococcus Faecium NOT DETECTED NOT DETECTED Final   Listeria monocytogenes NOT DETECTED NOT DETECTED Final   Staphylococcus species DETECTED (A) NOT DETECTED Final    Comment: CRITICAL RESULT CALLED TO, READ BACK BY AND VERIFIED WITH: PHARMD CAREN AMEND ON 04/26/23 @ 1737 BY DRT    Staphylococcus aureus (BCID) DETECTED (A) NOT DETECTED Final    Comment: CRITICAL RESULT CALLED TO, READ BACK BY AND VERIFIED WITH: PHARMD CAREN AMEND ON 04/26/23 @ 1737 BY DRT    Staphylococcus epidermidis NOT DETECTED NOT DETECTED Final   Staphylococcus lugdunensis NOT DETECTED NOT DETECTED Final   Streptococcus species NOT DETECTED NOT DETECTED  Final   Streptococcus agalactiae NOT DETECTED NOT DETECTED Final   Streptococcus pneumoniae NOT DETECTED NOT DETECTED Final   Streptococcus pyogenes NOT DETECTED NOT DETECTED Final   A.calcoaceticus-baumannii NOT DETECTED  NOT DETECTED Final   Bacteroides fragilis NOT DETECTED NOT DETECTED Final   Enterobacterales NOT DETECTED NOT DETECTED Final   Enterobacter cloacae complex NOT DETECTED NOT DETECTED Final   Escherichia coli NOT DETECTED NOT DETECTED Final   Klebsiella aerogenes NOT DETECTED NOT DETECTED Final   Klebsiella oxytoca NOT DETECTED NOT DETECTED Final   Klebsiella pneumoniae NOT DETECTED NOT DETECTED Final   Proteus species NOT DETECTED NOT DETECTED Final   Salmonella species NOT DETECTED NOT DETECTED Final   Serratia marcescens NOT DETECTED NOT DETECTED Final   Haemophilus influenzae NOT DETECTED NOT DETECTED Final   Neisseria meningitidis NOT DETECTED NOT DETECTED Final   Pseudomonas aeruginosa DETECTED (A) NOT DETECTED Final    Comment: CRITICAL RESULT CALLED TO, READ BACK BY AND VERIFIED WITH: PHARMD CAREN AMEND ON 04/26/23 @ 1737 BY DRT    Stenotrophomonas maltophilia NOT DETECTED NOT DETECTED Final   Candida albicans NOT DETECTED NOT DETECTED Final   Candida auris NOT DETECTED NOT DETECTED Final   Candida glabrata NOT DETECTED NOT DETECTED Final   Candida krusei NOT DETECTED NOT DETECTED Final   Candida parapsilosis NOT DETECTED NOT DETECTED Final   Candida tropicalis NOT DETECTED NOT DETECTED Final   Cryptococcus neoformans/gattii NOT DETECTED NOT DETECTED Final   CTX-M ESBL NOT DETECTED NOT DETECTED Final   Carbapenem resistance IMP NOT DETECTED NOT DETECTED Final   Carbapenem resistance KPC NOT DETECTED NOT DETECTED Final   Meth resistant mecA/C and MREJ NOT DETECTED NOT DETECTED Final   Carbapenem resistance NDM NOT DETECTED NOT DETECTED Final   Carbapenem resistance VIM NOT DETECTED NOT DETECTED Final    Comment: Performed at [facility], [address] [place], [place] [address]  Culture, blood (Routine X 2) w Reflex to ID Panel     Status: Abnormal (Preliminary result)   Collection Time: 04/26/23  1:50 AM   Specimen: BLOOD LEFT ARM  Result Value Ref Range Status   Specimen Description BLOOD LEFT ARM  Final   Special Requests   Final    BOTTLES DRAWN AEROBIC AND ANAEROBIC Blood Culture adequate volume   Culture  Setup Time   Final    GRAM POSITIVE COCCI GRAM NEGATIVE RODS CRITICAL RESULT CALLED TO, READ BACK BY AND VERIFIED WITH: PHARMD CAREN AMEND ON 04/26/23 @ 1737 BY DRT Performed at [facility], [address] [place], [place] [address]    Culture PSEUDOMONAS AERUGINOSA STAPHYLOCOCCUS AUREUS  (A)  Final   Report Status PENDING  Incomplete  Respiratory (~20 pathogens) panel by PCR     Status: None   Collection Time: 04/26/23  4:11 AM   Specimen: Anterior Nasal Swab; Respiratory  Result Value Ref Range Status   Adenovirus NOT DETECTED NOT DETECTED Final   Coronavirus 229E NOT DETECTED NOT DETECTED Final    Comment: (NOTE) The Coronavirus on the Respiratory Panel, DOES NOT test for the novel  Coronavirus (2019 nCoV)    Coronavirus HKU1 NOT DETECTED NOT DETECTED Final   Coronavirus NL63 NOT DETECTED NOT DETECTED Final   Coronavirus OC43 NOT DETECTED NOT DETECTED Final   Metapneumovirus NOT DETECTED NOT DETECTED Final   Rhinovirus / Enterovirus NOT DETECTED NOT DETECTED Final   Influenza A NOT DETECTED NOT DETECTED Final   Influenza B NOT DETECTED NOT DETECTED Final   Parainfluenza Virus 1 NOT DETECTED NOT DETECTED Final   Parainfluenza Virus 2 NOT DETECTED NOT DETECTED Final   Parainfluenza Virus 3 NOT DETECTED NOT DETECTED Final   Parainfluenza  Virus 4 NOT DETECTED NOT DETECTED Final   Respiratory Syncytial Virus NOT DETECTED NOT DETECTED Final   Bordetella pertussis NOT DETECTED NOT DETECTED Final   Bordetella Parapertussis NOT DETECTED NOT DETECTED Final   Chlamydophila pneumoniae NOT DETECTED NOT DETECTED Final    Mycoplasma pneumoniae NOT DETECTED NOT DETECTED Final    Comment: Performed at [facility], [address] [place], [place] [address]  SARS Coronavirus 2 by RT PCR (hospital order, performed in [facility] hospital lab) *cepheid single result test* Anterior Nasal Swab     Status: Abnormal   Collection Time: 04/26/23  4:11 AM   Specimen: Anterior Nasal Swab  Result Value Ref Range Status   SARS Coronavirus 2 by RT PCR POSITIVE (A) NEGATIVE Final    Comment: Performed at [facility], [address], [place], [place] [address]  MRSA Next Gen by PCR, Nasal     Status: None   Collection Time: 04/26/23  5:08 AM   Specimen: Nasal Mucosa; Nasal Swab  Result Value Ref Range Status   MRSA by PCR Next Gen NOT DETECTED NOT DETECTED Final    Comment: (NOTE) The GeneXpert MRSA Assay (FDA approved for NASAL specimens only), is one component of a comprehensive MRSA colonization surveillance program. It is not intended to diagnose MRSA infection nor to guide or monitor treatment for MRSA infections. Test performance is not FDA approved in patients less than 45 years old. Performed at [facility], [address] [place], [place] [address]     Studies/Results: ECHOCARDIOGRAM COMPLETE  Result Date: 04/27/2023    ECHOCARDIOGRAM REPORT   Patient Name:   Madison Holden Date of Exam: 04/27/2023 Medical Rec #:  [id number]       Height:       58.0 in Accession #:    [id number]      Weight:       126.1 lb Date of Birth:  April 22, 1946      BSA:          1.497 m Patient Age:    76 years        BP:           140/76 mmHg Patient Gender: F               HR:           75 bpm. Exam Location:  Inpatient Procedure: 2D Echo, Cardiac Doppler and Color Doppler Indications:    Shock R57.9  History:        Patient has prior history of Echocardiogram examinations, most                 recent 01/02/2023. Pulmonary HTN, Signs/Symptoms:Chest Pain; Risk                 Factors:Hypertension, Diabetes and Dyslipidemia.   Sonographer:    Lucendia Herrlich Referring Phys: [id number] LAURA R GLEASON IMPRESSIONS  1. Left ventricular ejection fraction, by estimation, is 60 to 65%. The left ventricle has normal function. The left ventricle has no regional wall motion abnormalities. There is mild left ventricular hypertrophy. Left ventricular diastolic parameters are consistent with Grade I diastolic dysfunction (impaired relaxation). Elevated left ventricular end-diastolic pressure.  2. Right ventricular systolic function is severely reduced. The right ventricular size is severely enlarged.  3. Left atrial size was moderately dilated.  4. Right atrial size was mildly dilated.  5. The mitral valve is abnormal. Trivial mitral valve regurgitation. No evidence of  mitral stenosis. Moderate mitral annular calcification.  6. Tricuspid valve regurgitation is moderate.  7. The aortic valve is tricuspid. There is mild calcification of the aortic valve. There is mild thickening of the aortic valve. Aortic valve regurgitation is mild. Aortic valve sclerosis is present, with no evidence of aortic valve stenosis.  8. The inferior vena cava is normal in size with greater than 50% respiratory variability, suggesting right atrial pressure of 3 mmHg. FINDINGS  Left Ventricle: Left ventricular ejection fraction, by estimation, is 60 to 65%. The left ventricle has normal function. The left ventricle has no regional wall motion abnormalities. The left ventricular internal cavity size was normal in size. There is  mild left ventricular hypertrophy. Left ventricular diastolic parameters are consistent with Grade I diastolic dysfunction (impaired relaxation). Elevated left ventricular end-diastolic pressure. Right Ventricle: The right ventricular size is severely enlarged. No increase in right ventricular wall thickness. Right ventricular systolic function is severely reduced. Left Atrium: Left atrial size was moderately dilated. Right Atrium: Right atrial size was  mildly dilated. Pericardium: Trivial pericardial effusion is present. The pericardial effusion is posterior to the left ventricle. Mitral Valve: The mitral valve is abnormal. There is mild thickening of the mitral valve leaflet(s). Moderate mitral annular calcification. Trivial mitral valve regurgitation. No evidence of mitral valve stenosis. MV peak gradient, 9.5 mmHg. The mean mitral valve gradient is 3.0 mmHg. Tricuspid Valve: The tricuspid valve is normal in structure. Tricuspid valve regurgitation is moderate . No evidence of tricuspid stenosis. Aortic Valve: The aortic valve is tricuspid. There is mild calcification of the aortic valve. There is mild thickening of the aortic valve. Aortic valve regurgitation is mild. Aortic regurgitation PHT measures 571 msec. Aortic valve sclerosis is present,  with no evidence of aortic valve stenosis. Aortic valve mean gradient measures 5.0 mmHg. Aortic valve peak gradient measures 9.8 mmHg. Aortic valve area, by VTI measures 2.00 cm. Pulmonic Valve: The pulmonic valve was normal in structure. Pulmonic valve regurgitation is mild. No evidence of pulmonic stenosis. Aorta: The aortic root is normal in size and structure. Venous: The inferior vena cava is normal in size with greater than 50% respiratory variability, suggesting right atrial pressure of 3 mmHg. IAS/Shunts: The interatrial septum was not well visualized.  LEFT VENTRICLE PLAX 2D LVIDd:         3.90 cm   Diastology LVIDs:         2.30 cm   LV e' medial:    6.92 cm/s LV PW:         1.10 cm   LV E/e' medial:  14.1 LV IVS:        1.30 cm   LV e' lateral:   5.52 cm/s LVOT diam:     2.00 cm   LV E/e' lateral: 17.6 LV SV:         65 LV SV Index:   44 LVOT Area:     3.14 cm  RIGHT VENTRICLE RV S prime:     8.73 cm/s TAPSE (M-mode): 1.1 cm LEFT ATRIUM             Index        RIGHT ATRIUM           Index LA diam:        4.30 cm 2.87 cm/m   RA Area:     15.30 cm LA Vol (A2C):   72.6 ml 48.53 ml/m  RA Volume:   36.25 ml   24.22 ml/m LA Vol (  A4C):   83.1 ml 55.51 ml/m LA Biplane Vol: 80.3 ml 53.64 ml/m  AORTIC VALVE                     PULMONIC VALVE AV Area (Vmax):    1.89 cm      PR End Diast Vel: 15.37 msec AV Area (Vmean):   1.94 cm AV Area (VTI):     2.00 cm AV Vmax:           156.50 cm/s AV Vmean:          101.000 cm/s AV VTI:            0.327 m AV Peak Grad:      9.8 mmHg AV Mean Grad:      5.0 mmHg LVOT Vmax:         94.30 cm/s LVOT Vmean:        62.400 cm/s LVOT VTI:          0.208 m LVOT/AV VTI ratio: 0.64 AI PHT:            571 msec  AORTA Ao Root diam: 3.20 cm Ao Asc diam:  3.50 cm MITRAL VALVE                TRICUSPID VALVE MV Area (PHT): 2.86 cm     TR Peak grad:   107.7 mmHg MV Area VTI:   1.57 cm     TR Vmax:        519.00 cm/s MV Peak grad:  9.5 mmHg MV Mean grad:  3.0 mmHg     SHUNTS MV Vmax:       1.54 m/s     Systemic VTI:  0.21 m MV Vmean:      82.0 cm/s    Systemic Diam: 2.00 cm MV Decel Time: 266 msec MV E velocity: 97.30 cm/s MV A velocity: 133.00 cm/s MV E/A ratio:  0.73 Charlton Haws MD Electronically signed by Charlton Haws MD Signature Date/Time: 04/27/2023/11:31:05 AM    Final       Assessment/Plan:  INTERVAL HISTORY: patient on high flow o2   Principal Problem:   Polymicrobial bacterial infection Active Problems:   Respiratory failure (HCC)   COVID-19 virus infection   PAH (pulmonary artery hypertension) (HCC)   Septic shock (HCC)    Madison Holden is a 77 y.o. female with arthritis on Enbrel and methotrexate admitted with respiratory failure in the context of COVID-19 infection and thick shock with MSSA and pseudomonal bacteremia.  #1 MSSA and due to mental bacteremia:  Continue cefepime at present  Repeat blood cultures taken  #2 COVID 19 per primary on steroids   #3 RA: should not get potent suppression again if Madison Holden survives  #4 Goals of care: Patient and husband would not want care escalated and should Madison Holden deteriorate they would want to move to pure comfort care  without antibiotics  I have personally spent 50 minutes involved in face-to-face and non-face-to-face activities for this patient on the day of the visit. Professional time spent includes the following activities: Preparing to see the patient (review of tests), Obtaining and/or reviewing separately obtained history (admission/discharge record), Performing a medically appropriate examination and/or evaluation , Ordering medications/tests/procedures, referring and communicating with other health care professionals, Documenting clinical information in the EMR, Independently interpreting results (not separately reported), Communicating results to the patient/family/caregiver, Counseling and educating the patient/family/caregiver and Care coordination (not separately reported).   Dr. Luciana Axe will be here tomorrow.  LOS: 2 days   Acey Lav 04/28/2023, 2:11 PM

## 2023-04-28 NOTE — IPAL (Signed)
  Interdisciplinary Goals of Care Family Meeting   Date carried out: 04/28/2023  Location of the meeting: Bedside  Member's involved: Physician, Bedside Registered Nurse, and Family Member or next of kin  Durable Power of Attorney or acting medical decision maker: [name]  Discussion: We discussed goals of care for Madison Holden .  Patient condition was updated to patient's husband at bedside in detail.  Nurses informed patient's husband that patient was clearly saying she would not want to continue live like this and would like to proceed with comfort care.  It was explained to the patient that she has been on BiPAP for 2 days which is uncomfortable, we decided to remove BiPAP and keep patient on high flow nasal cannula oxygen and continue current treatment, if she fails high flow nasal cannula oxygen and continues to desat patient has been is in agreement to proceed with comfort care and does not want her to be on BiPAP. BiPAP was removed, patient was placed on nasal cannula oxygen, currently she is a stable Continue current management while keeping her DNR DNI  Code status:   Code Status: DNR   Disposition: Continue current acute care    Cheri Fowler, MD [place] [facility] See Amion for pager If no response to pager, please call [phone] until 7pm After 7pm, Please call E-link [phone]

## 2023-04-28 NOTE — Progress Notes (Signed)
NAME:  Madison Holden, MRN:  [id number], DOB:  May 09, 1946, LOS: 2 ADMISSION DATE:  04/26/2023, CONSULTATION DATE:  04/28/23 REFERRING MD:  EDP, CHIEF COMPLAINT:  hypoxia    History of Present Illness:  Madison Holden is a 77 y.o. F with PMH significant for ILD/pulmonary fibrosis, RA on Enbrel and MTX, PAH and R heart failure who presented to the ED with acutely worsening shortness of breath and hypoxia to 76% on home [place].  This was in the setting of about one week of minimal po intake, husband thinks that this was due to [place] which she resumed 6/23 after stopping it herself due to side effects.   She has been coughing up some phlegm with nasal congestion, but not significant amounts and denies fever, diarrhea or abdominal pain.  She required Bipap in the ED and CXR showed her chronic ILD with scattered airspace opacities, though not significantly changed from prior.   Labs were significant for lactic acid of 4.1, BNP 839, WBC 21.5, troponin 86.   She was hypotensive and required low dose levophed.   PCCM consulted for admission  Pertinent  Medical History   has a past medical history of Ambulates with cane, Anemia, Arthritis, Arthritis, Cancer, uterine (HCC), Chest pain, unspecified (07-01-2018  per pt had 2 day chest pain and sob  6 weeks ago,, per pt no symptoms since), Diarrhea, unspecified, DOE (dyspnea on exertion), Endometrial cancer (HCC), Essential hypertension, Frequency of urination, GERD (gastroesophageal reflux disease), Gout (rheumologist-- michelle young PA ([facility])), Heart murmur, History of adenomatous polyp of colon, History of esophageal dilatation, Mild nausea, Mixed dyslipidemia, Muscle weakness of extremity, Poor appetite, Type 2 diabetes mellitus (HCC), Urgency of urination, and Urine frequency.   Significant Hospital Events: Including procedures, antibiotic start and stop dates in addition to other pertinent events   7/5 admit to PCCM on bipap and levophed   Interim History / Subjective:  Patient remained on BiPAP for almost 2 days now Continue to require Precedex infusion due to agitation Remain afebrile White count started trending down  Objective   Blood pressure 107/70, pulse 68, temperature (!) 97.2 F (36.2 C), temperature source Axillary, resp. rate 18, weight 58.7 kg, SpO2 95 %.    Vent Mode: PCV;BIPAP FiO2 (%):  [40 %] 40 % Set Rate:  [15 bmp] 15 bmp PEEP:  [6 cmH20-8 cmH20] 8 cmH20 Pressure Support:  [8 cmH20] 8 cmH20   Intake/Output Summary (Last 24 hours) at 04/28/2023 0809 Last data filed at 04/28/2023 0700 Gross per 24 hour  Intake 1318.92 ml  Output 420 ml  Net 898.92 ml   Filed Weights   04/26/23 0500 04/27/23 0549 04/28/23 0618  Weight: 57.2 kg 57.2 kg 58.7 kg       Physical exam: General: Crtitically ill-appearing female, on BiPAP HEENT: [place]/AT, eyes anicteric.  Dry mucous membranes Neuro: Eyes closed, does not open, following commands, agitated, trying to get out of bed.  Chest: Tachypneic, bilateral coarse breath sounds, no wheezes or rhonchi Heart: Regular rate and rhythm, no murmurs or gallops Abdomen: Soft, nondistended, bowel sounds present Skin: No rash  Labs and images were reviewed  Resolved Hospital Problem list   Mixed metabolic/respiratory acidosis, improved  Assessment & Plan:  Acute on chronic hypoxic/hypercapnic respiratory failure on 3L home O2  COVID-19 pneumonia Probable superimposed bacterial pneumonia ILD likely secondary to RA On Enbrel and methotrexate at baseline, on hold for now due to active infection Transition BIPAP to high flow nasal cannula oxygen, O2  sat goal 92%, patient would not want to go back on BiPAP.  Stated if she struggles, would like to proceed with comfort care Head of bed elevated 30 degrees Hypercapnia has cleared Continue steroid to treat COVID-pneumonia Hold immunosuppressive therapy Continue IV cefepime Aspiration precautions  Severe septic shock  secondary to PNA, POA  MSSA/Pseudomonas bacteremia CTA positive for RLL PNA in the setting for COVID Procalcitonin was LE rated Patient blood cultures came back positive for MSSA and Pseudomonas Could not get repeat blood cultures due to poor IV access Continue IV cefepime Infectious disease is following Continue vasopressor support with Levophed, MAP goal 65, currently on 1 mic of Levophed White count trended down from 32-21  Chronic HFpEF with pulmonary hypertension Demand cardiac ischemia Patient looks euvolemic Monitor intake and output Hold GDMT in the setting of shock  AKI, improved Hypokalemia/hypophosphatemia Patient presented with serum creatinine of 1.5, now improved back to her baseline 0.5 Monitor intake and output Avoid nephrotoxic agent Continue aggressive electrolyte supplement  Anemia and thrombocytopenia of critical illness Monitor CBC Platelet count started improving now to 70 up from 56   Best Practice (right click and "Reselect all SmartList Selections" daily)   Diet/type: NPO, speech and swallow evaluation after coming off of BiPAP DVT prophylaxis: Subcu Lovenox GI prophylaxis: N/A Lines: N/A Foley:  N/A Code Status: DNR/DNI Last date of multidisciplinary goals of care discussion: 7/7, please see ipal note   The patient is critically ill due to acute on chronic hypoxic/hypercapnic respiratory failure/septic shock critical care was necessary to treat or prevent imminent or life-threatening deterioration.  Critical care was time spent personally by me on the following activities: development of treatment plan with patient and/or surrogate as well as nursing, discussions with consultants, evaluation of patient's response to treatment, examination of patient, obtaining history from patient or surrogate, ordering and performing treatments and interventions, ordering and review of laboratory studies, ordering and review of radiographic studies, pulse oximetry,  re-evaluation of patient's condition and participation in multidisciplinary rounds.   During this encounter critical care time was devoted to patient care services described in this note for 37 minutes.     Cheri Fowler, MD [facility] See Amion for pager If no response to pager, please call [phone] until 7pm After 7pm, Please call E-link [phone]

## 2023-04-29 DIAGNOSIS — B965 Pseudomonas (aeruginosa) (mallei) (pseudomallei) as the cause of diseases classified elsewhere: Secondary | ICD-10-CM | POA: Diagnosis not present

## 2023-04-29 DIAGNOSIS — B9561 Methicillin susceptible Staphylococcus aureus infection as the cause of diseases classified elsewhere: Secondary | ICD-10-CM | POA: Diagnosis not present

## 2023-04-29 DIAGNOSIS — J1282 Pneumonia due to coronavirus disease 2019: Secondary | ICD-10-CM | POA: Diagnosis not present

## 2023-04-29 DIAGNOSIS — U071 COVID-19: Secondary | ICD-10-CM | POA: Diagnosis not present

## 2023-04-29 DIAGNOSIS — A499 Bacterial infection, unspecified: Secondary | ICD-10-CM | POA: Diagnosis not present

## 2023-04-29 LAB — BASIC METABOLIC PANEL
Anion gap: 11 (ref 5–15)
BUN: 20 mg/dL (ref 8–23)
CO2: 22 mmol/L (ref 22–32)
Calcium: 8.6 mg/dL — ABNORMAL LOW (ref 8.9–10.3)
Chloride: 105 mmol/L (ref 98–111)
Creatinine, Ser: 0.69 mg/dL (ref 0.44–1.00)
GFR, Estimated: >60 mL/min (ref >60)
Glucose, Bld: 129 mg/dL — ABNORMAL HIGH (ref 70–99)
Potassium: 3.7 mmol/L (ref 3.5–5.1)
Sodium: 138 mmol/L (ref 135–145)

## 2023-04-29 LAB — CULTURE, BLOOD (ROUTINE X 2): Special Requests: ADEQUATE

## 2023-04-29 LAB — GLUCOSE, CAPILLARY
Glucose-Capillary: 108 mg/dL — ABNORMAL HIGH (ref 70–99)
Glucose-Capillary: 129 mg/dL — ABNORMAL HIGH (ref 70–99)
Glucose-Capillary: 102 mg/dL — ABNORMAL HIGH (ref 70–99)
Glucose-Capillary: 122 mg/dL — ABNORMAL HIGH (ref 70–99)
Glucose-Capillary: 89 mg/dL (ref 70–99)
Glucose-Capillary: 97 mg/dL (ref 70–99)
Glucose-Capillary: 101 mg/dL — ABNORMAL HIGH (ref 70–99)

## 2023-04-29 LAB — CBC
HCT: 29.5 % — ABNORMAL LOW (ref 36.0–46.0)
Hemoglobin: 9 g/dL — ABNORMAL LOW (ref 12.0–15.0)
MCH: 32.3 pg (ref 26.0–34.0)
MCHC: 30.5 g/dL (ref 30.0–36.0)
MCV: 105.7 fL — ABNORMAL HIGH (ref 80.0–100.0)
Platelets: 61 10*3/uL — ABNORMAL LOW (ref 150–400)
RBC: 2.79 MIL/uL — ABNORMAL LOW (ref 3.87–5.11)
RDW: 18.5 % — ABNORMAL HIGH (ref 11.5–15.5)
WBC: 18.5 10*3/uL — ABNORMAL HIGH (ref 4.0–10.5)
nRBC: 0.1 % (ref 0.0–0.2)

## 2023-04-29 MED ORDER — CIPROFLOXACIN IN D5W 400 MG/200ML IV SOLN
400.0000 mg | Freq: Once | INTRAVENOUS | Status: AC
  Administered 2023-04-30: 400 mg via INTRAVENOUS
  Filled 2023-04-29: qty 200

## 2023-04-29 MED ORDER — FLUTICASONE PROPIONATE 50 MCG/ACT NA SUSP
1.0000 | Freq: Every day | NASAL | Status: DC
  Administered 2023-04-29 – 2023-05-04 (×6): 1 via NASAL
  Filled 2023-04-29: qty 16

## 2023-04-29 MED ORDER — POTASSIUM CHLORIDE 10 MEQ/100ML IV SOLN
10.0000 meq | INTRAVENOUS | Status: AC
  Administered 2023-04-29 (×4): 10 meq via INTRAVENOUS
  Filled 2023-04-29 (×4): qty 100

## 2023-04-29 MED ORDER — CIPROFLOXACIN HCL 500 MG PO TABS
750.0000 mg | ORAL_TABLET | Freq: Two times a day (BID) | ORAL | Status: DC
  Administered 2023-04-30 – 2023-05-01 (×2): 750 mg via ORAL
  Filled 2023-04-29: qty 2
  Filled 2023-04-29: qty 1
  Filled 2023-04-29: qty 2
  Filled 2023-04-29: qty 1
  Filled 2023-04-29: qty 2

## 2023-04-29 MED ORDER — CEFAZOLIN SODIUM-DEXTROSE 2-4 GM/100ML-% IV SOLN
2.0000 g | Freq: Three times a day (TID) | INTRAVENOUS | Status: DC
  Administered 2023-04-29 – 2023-05-05 (×17): 2 g via INTRAVENOUS
  Filled 2023-04-29 (×17): qty 100

## 2023-04-29 NOTE — Progress Notes (Addendum)
NAME:  Madison Holden, MRN:  [id number], DOB:  1946/02/29, LOS: 3 ADMISSION DATE:  04/26/2023, CONSULTATION DATE:  04/29/23 REFERRING MD:  EDP, CHIEF COMPLAINT:  hypoxia    History of Present Illness:  Madison Holden is a 77 y.o. F with PMH significant for ILD/pulmonary fibrosis, RA on Enbrel and MTX, PAH and R heart failure who presented to the ED with acutely worsening shortness of breath and hypoxia to 76% on home [place].  This was in the setting of about one week of minimal po intake, husband thinks that this was due to [place] which she resumed 6/23 after stopping it herself due to side effects.   She has been coughing up some phlegm with nasal congestion, but not significant amounts and denies fever, diarrhea or abdominal pain.  She required Bipap in the ED and CXR showed her chronic ILD with scattered airspace opacities, though not significantly changed from prior.   Labs were significant for lactic acid of 4.1, BNP 839, WBC 21.5, troponin 86.   She was hypotensive and required low dose levophed.   PCCM consulted for admission  Pertinent  Medical History   has a past medical history of Ambulates with cane, Anemia, Arthritis, Arthritis, Cancer, uterine (HCC), Chest pain, unspecified (07-01-2018  per pt had 2 day chest pain and sob  6 weeks ago,, per pt no symptoms since), Diarrhea, unspecified, DOE (dyspnea on exertion), Endometrial cancer (HCC), Essential hypertension, Frequency of urination, GERD (gastroesophageal reflux disease), Gout (rheumologist-- michelle young PA ([facility])), Heart murmur, History of adenomatous polyp of colon, History of esophageal dilatation, Mild nausea, Mixed dyslipidemia, Muscle weakness of extremity, Poor appetite, Type 2 diabetes mellitus (HCC), Urgency of urination, and Urine frequency.   Significant Hospital Events: Including procedures, antibiotic start and stop dates in addition to other pertinent events   7/5 admit to PCCM on bipap and levophed  7/7 GOC discussion. Been on BiPAP for 2 days. De-escalated to [facility]. No plans for escalation. DNR  Interim History / Subjective:  No acute event overnight Remains on 50 lpm and 75% oxygen  Objective   Blood pressure 117/80, pulse (!) 116, temperature 99.2 F (37.3 C), temperature source Oral, resp. rate 20, weight 57.1 kg, SpO2 98 %.    FiO2 (%):  [73 %-95 %] 73 %   Intake/Output Summary (Last 24 hours) at 04/29/2023 0820 Last data filed at 04/29/2023 0813 Gross per 24 hour  Intake 752.81 ml  Output 680 ml  Net 72.81 ml    Filed Weights   04/27/23 0549 04/28/23 0618 04/29/23 0700  Weight: 57.2 kg 58.7 kg 57.1 kg    Physical exam: General: acute on chronically ill appearing elderly female HEENT: [place]/AT, PERRL, no JVD Neuro: alert, oriented, non-focal. Confused at times.  Chest: Tachypneic, minimal distress.  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 No escalation to BiPAP. DNR/DNI Continue steroid to treat COVID-pneumonia Hold immunosuppressive therapy Continue IV cefepime Aspiration precautions PRN morphine for air hunger  Severe septic shock secondary to PNA, POA  MSSA/Pseudomonas bacteremia CTA positive for RLL PNA in the setting for COVID Continue IV cefepime Infectious disease is  following Norepinephrine is off  Chronic HFpEF with pulmonary hypertension Demand cardiac ischemia Patient looks euvolemic Monitor intake and output Holding GDMT in the setting of shock  AKI, improved Hypokalemia/hypophosphatemia Patient presented with serum creatinine of 1.5, now  improved back to her baseline Monitor intake and output Avoid nephrotoxic agent Continue aggressive electrolyte supplement  Anemia and thrombocytopenia of critical illness Monitor CBC Platelet count started improving now to 70 up from 56  Consult placed to palliative medicine.  Patient has made comments to overnight staff she is tired of current therapy and wants to be comfortable She and husband tell me they wish to continue acute treatment and try to get better.    Best Practice (right click and "Reselect all SmartList Selections" daily)   Diet/type: NPO DVT prophylaxis: Subcu Lovenox GI prophylaxis: N/A Lines: N/A Foley:  N/A Code Status: DNR/DNI Last date of multidisciplinary goals of care discussion: 7/7, please see ipal note   Critical care time: NA   Joneen Roach, AGACNP-BC [facility]  See Amion for personal pager PCCM on call pager [phone] until 7pm. Please call Elink 7p-7a. [phone]  04/29/2023 8:22 AM

## 2023-04-29 NOTE — Progress Notes (Signed)
Pharmacy Antibiotic Note  Madison Holden is a 77 y.o. female with pneumonia on Enbrel. And methotrexate PTA. Pharmacy has been consulted for cefepime dosing. -WBC= 18.5 (trend down), afebrile, SCr= 0.69 -blood cultures noted with pseudomonas snd MSSA with repeat cultures pending -ID also following  Plan: -Cefepime 2gm IV q12h -Will follow renal function, cultures and clinical progress   Weight: 57.1 kg (125 lb 14.1 oz)  Temp (24hrs), Avg:99.1 F (37.3 C), Min:98.8 F (37.1 C), Max:99.4 F (37.4 C)  Recent Labs  Lab 04/26/23 0120 04/26/23 0318 04/26/23 0434 04/26/23 0730 04/27/23 0757 04/28/23 0021 04/28/23 1000 04/29/23 0224  WBC 21.5*  --  30.7*  --  32.1* 21.6*  --  18.5*  CREATININE 1.50*  --  1.39*  --  0.57  --  0.61 0.69  LATICACIDVEN 4.1* 4.4*  --  2.8*  --   --   --   --      Estimated Creatinine Clearance: 46.2 mL/min (by C-G formula based on SCr of 0.69 mg/dL).    Allergies  Allergen Reactions   Lovastatin     myalgia    Antimicrobials this admission: 7/5 CTX>> 7/5 7/5 cefepime  Dose adjustments this admission:   Microbiology results: 7/7 blood x2- ngtd 7/5 blood x2- pseudomonas, MSSA  7/5 COVID + 7/5 MRSA PCR- neg  Thank you for allowing pharmacy to be a part of this patient's care.  Harland German, PharmD Clinical Pharmacist **Pharmacist phone directory can now be found on [email or web address] ([email or web address]).  Listed under [facility].

## 2023-04-29 NOTE — Progress Notes (Signed)
   04/29/23 1853  Vitals  Temp 98.3 F (36.8 C)  Temp Source Oral  BP (!) 154/117  MAP (mmHg) 131  Pulse Rate (!) 121  ECG Heart Rate (!) 121  Resp (!) 21  MEWS COLOR  MEWS Score Color Yellow  Oxygen Therapy  SpO2 91 %  O2 Device HFNC (heated HFNC)  MEWS Score  MEWS Temp 0  MEWS Systolic 0  MEWS Pulse 2  MEWS RR 1  MEWS LOC 0  MEWS Score 3   Pt arrived to 4E09 via bed from [facility] ICU. Received report from [place], Charity fundraiser.

## 2023-04-29 NOTE — Progress Notes (Signed)
[facility] ADULT ICU REPLACEMENT PROTOCOL   The patient does apply for the [facility] Adult ICU Electrolyte Replacment Protocol based on the criteria listed below:   1.Exclusion criteria: TCTS, ECMO, Dialysis, and Myasthenia Gravis patients 2. Is GFR >/= 30 ml/min? Yes.    Patient's GFR today is >60 3. Is SCr </= 2? Yes.   Patient's SCr is 0.69 mg/dL 4. Did SCr increase >/= 0.5 in 24 hours? No. 5.Pt's weight >40kg  Yes.   6. Abnormal electrolyte(s): K  7. Electrolytes replaced per protocol 8.  Call MD STAT for K+ </= 2.5, Phos </= 1, or Mag </= 1 Physician:  Thersa Salt [place]an [place] 04/29/2023 3:57 AM

## 2023-04-29 NOTE — Progress Notes (Signed)
[facility]  Date of Admission:  04/26/2023     CC: Covid pna/infection Polymicrobial bsi (mssa/pseudomonas)  Lines: Peripheral iv's  Abx: 7/5-c cefepime  7/5 ceftriaxone/azith  ASSESSMENT: RA with interstitial lung disease, patient on methotrexate and enbrel admitted with septic shock found to have covid infection (cycle threshold 13) and mssa/pseudomonas bacteremia  7/5 pan-body ct interstitial opacities and right lower lobe pna  No focal pain in terms of joint/back involvement and no prosthesis   --- 7/8 assessment Clinically improving off pressors and planned to be transferred to floor Pulmonology team concerned given patient's baseline respiratory status likely won't tolerate tee. In this setting will defer unless ongoing bacteremia, heart block, fever, or chf  Plan to treat 6 weeks abx for mssa bsi   PLAN: Stop cefepime Start ciprofloxacin pseudomonal dosing for another 7 days 7/9-7/15  Start cefazolin for dedicated mssa treatment and likely will need 6 weeks Will defer tee for now Covid management per primary team Discussed with primary team   I spent more than 50 minute reviewing data/chart, and coordinating care, providing direct face to face time providing counseling/discussing diagnostics/treatment plan with patient and treatment team    Principal Problem:   Polymicrobial bacterial infection Active Problems:   Respiratory failure (HCC)   COVID-19 virus infection   PAH (pulmonary artery hypertension) (HCC)   Septic shock (HCC)   Allergies  Allergen Reactions   Lovastatin     myalgia    Scheduled Meds:  arformoterol  15 mcg Nebulization BID   budesonide (PULMICORT) nebulizer solution  0.25 mg Nebulization BID   Chlorhexidine Gluconate Cloth  6 each Topical Q0600   enoxaparin (LOVENOX) injection  40 mg Subcutaneous Q24H   fluticasone  1 spray Each Nare Daily   haloperidol lactate  1 mg Intravenous Once    insulin aspart  0-15 Units Subcutaneous Q4H   methylPREDNISolone (SOLU-MEDROL) injection  120 mg Intravenous Q24H   revefenacin  175 mcg Nebulization Daily   Continuous Infusions:  sodium chloride Stopped (04/27/23 1426)   ceFEPime (MAXIPIME) IV Stopped (04/29/23 0032)   dexmedetomidine (PRECEDEX) IV infusion Stopped (04/28/23 0856)   norepinephrine (LEVOPHED) Adult infusion Stopped (04/29/23 0002)   PRN Meds:.docusate sodium, morphine injection, polyethylene glycol   SUBJECTIVE: Afebrile No focal pain On hfnc  Off pressors Pulm ccm planning on transferring to floor pending bed availability  Remains on cefepime today  Review of Systems: ROS All other ROS was negative, except mentioned above     OBJECTIVE: Vitals:   04/29/23 0915 04/29/23 0930 04/29/23 0945 04/29/23 1000  BP:  (!) 140/75  126/74  Pulse: (!) 111 (!) 124 (!) 127 (!) 119  Resp: (!) 24 (!) 21 (!) 24 (!) 41  Temp:      TempSrc:      SpO2: 97% 93% 97% 95%  Weight:       Body mass index is 26.31 kg/m.  Physical Exam General/constitutional: no distress, on hfnc, speaking full sentences but dyspneic with that HEENT: Normocephalic, PER Neck supple CV: rrr no mrg Lungs: clear to auscultation, normal respiratory effort Abd: Soft, Nontender Ext: no edema Skin: scattered bilateral distal UE purpura Neuro: generalized weakness MSK: no peripheral joint swelling/tenderness/warmth; back spines nontender   Lab Results Lab Results  Component Value Date   WBC 18.5 (H) 04/29/2023   HGB 9.0 (L) 04/29/2023   HCT 29.5 (L) 04/29/2023   MCV 105.7 (H) 04/29/2023   PLT 61 (L) 04/29/2023  Lab Results  Component Value Date   CREATININE 0.69 04/29/2023   BUN 20 04/29/2023   NA 138 04/29/2023   K 3.7 04/29/2023   CL 105 04/29/2023   CO2 22 04/29/2023    Lab Results  Component Value Date   ALT 12 04/28/2023   AST 25 04/28/2023   ALKPHOS 87 04/28/2023   BILITOT 2.0 (H) 04/28/2023       Microbiology: Recent Results (from the past 240 hour(s))  Culture, blood (Routine X 2) w Reflex to ID Panel     Status: Abnormal   Collection Time: 04/26/23  1:20 AM   Specimen: BLOOD  Result Value Ref Range Status   Specimen Description BLOOD SITE NOT SPECIFIED  Final   Special Requests   Final    BOTTLES DRAWN AEROBIC AND ANAEROBIC Blood Culture adequate volume   Culture  Setup Time   Final    GRAM POSITIVE COCCI GRAM NEGATIVE RODS IN BOTH AEROBIC AND ANAEROBIC BOTTLES CRITICAL RESULT CALLED TO, READ BACK BY AND VERIFIED WITH: PHARMD CAREN AMEND ON 04/26/23 @ 1737 BY DRT Performed at [facility], [address], [place], [place] [address]    Culture PSEUDOMONAS AERUGINOSA STAPHYLOCOCCUS AUREUS  (A)  Final   Report Status 04/29/2023 FINAL  Final   Organism ID, Bacteria PSEUDOMONAS AERUGINOSA  Final   Organism ID, Bacteria STAPHYLOCOCCUS AUREUS  Final      Susceptibility   Pseudomonas aeruginosa - MIC*    CEFTAZIDIME 4 SENSITIVE Sensitive     CIPROFLOXACIN <=0.25 SENSITIVE Sensitive     GENTAMICIN <=1 SENSITIVE Sensitive     IMIPENEM 1 SENSITIVE Sensitive     PIP/TAZO 8 SENSITIVE Sensitive     CEFEPIME 2 SENSITIVE Sensitive     * PSEUDOMONAS AERUGINOSA   Staphylococcus aureus - MIC*    CIPROFLOXACIN <=0.5 SENSITIVE Sensitive     ERYTHROMYCIN <=0.25 SENSITIVE Sensitive     GENTAMICIN <=0.5 SENSITIVE Sensitive     OXACILLIN 0.5 SENSITIVE Sensitive     TETRACYCLINE <=1 SENSITIVE Sensitive     VANCOMYCIN 1 SENSITIVE Sensitive     TRIMETH/SULFA <=10 SENSITIVE Sensitive     CLINDAMYCIN <=0.25 SENSITIVE Sensitive     RIFAMPIN <=0.5 SENSITIVE Sensitive     Inducible Clindamycin NEGATIVE Sensitive     LINEZOLID 2 SENSITIVE Sensitive     * STAPHYLOCOCCUS AUREUS  Blood Culture ID Panel (Reflexed)     Status: Abnormal   Collection Time: 04/26/23  1:20 AM  Result Value Ref Range Status   Enterococcus faecalis NOT DETECTED NOT DETECTED Final   Enterococcus Faecium NOT  DETECTED NOT DETECTED Final   Listeria monocytogenes NOT DETECTED NOT DETECTED Final   Staphylococcus species DETECTED (A) NOT DETECTED Final    Comment: CRITICAL RESULT CALLED TO, READ BACK BY AND VERIFIED WITH: PHARMD CAREN AMEND ON 04/26/23 @ 1737 BY DRT    Staphylococcus aureus (BCID) DETECTED (A) NOT DETECTED Final    Comment: CRITICAL RESULT CALLED TO, READ BACK BY AND VERIFIED WITH: PHARMD CAREN AMEND ON 04/26/23 @ 1737 BY DRT    Staphylococcus epidermidis NOT DETECTED NOT DETECTED Final   Staphylococcus lugdunensis NOT DETECTED NOT DETECTED Final   Streptococcus species NOT DETECTED NOT DETECTED Final   Streptococcus agalactiae NOT DETECTED NOT DETECTED Final   Streptococcus pneumoniae NOT DETECTED NOT DETECTED Final   Streptococcus pyogenes NOT DETECTED NOT DETECTED Final   A.calcoaceticus-baumannii NOT DETECTED NOT DETECTED Final   Bacteroides fragilis NOT DETECTED NOT DETECTED Final   Enterobacterales NOT  DETECTED NOT DETECTED Final   Enterobacter cloacae complex NOT DETECTED NOT DETECTED Final   Escherichia coli NOT DETECTED NOT DETECTED Final   Klebsiella aerogenes NOT DETECTED NOT DETECTED Final   Klebsiella oxytoca NOT DETECTED NOT DETECTED Final   Klebsiella pneumoniae NOT DETECTED NOT DETECTED Final   Proteus species NOT DETECTED NOT DETECTED Final   Salmonella species NOT DETECTED NOT DETECTED Final   Serratia marcescens NOT DETECTED NOT DETECTED Final   Haemophilus influenzae NOT DETECTED NOT DETECTED Final   Neisseria meningitidis NOT DETECTED NOT DETECTED Final   Pseudomonas aeruginosa DETECTED (A) NOT DETECTED Final    Comment: CRITICAL RESULT CALLED TO, READ BACK BY AND VERIFIED WITH: PHARMD CAREN AMEND ON 04/26/23 @ 1737 BY DRT    Stenotrophomonas maltophilia NOT DETECTED NOT DETECTED Final   Candida albicans NOT DETECTED NOT DETECTED Final   Candida auris NOT DETECTED NOT DETECTED Final   Candida glabrata NOT DETECTED NOT DETECTED Final   Candida krusei NOT  DETECTED NOT DETECTED Final   Candida parapsilosis NOT DETECTED NOT DETECTED Final   Candida tropicalis NOT DETECTED NOT DETECTED Final   Cryptococcus neoformans/gattii NOT DETECTED NOT DETECTED Final   CTX-M ESBL NOT DETECTED NOT DETECTED Final   Carbapenem resistance IMP NOT DETECTED NOT DETECTED Final   Carbapenem resistance KPC NOT DETECTED NOT DETECTED Final   Meth resistant mecA/C and MREJ NOT DETECTED NOT DETECTED Final   Carbapenem resistance NDM NOT DETECTED NOT DETECTED Final   Carbapenem resistance VIM NOT DETECTED NOT DETECTED Final    Comment: Performed at [facility], [address] [place], [place] [address]  Culture, blood (Routine X 2) w Reflex to ID Panel     Status: Abnormal   Collection Time: 04/26/23  1:50 AM   Specimen: BLOOD LEFT ARM  Result Value Ref Range Status   Specimen Description BLOOD LEFT ARM  Final   Special Requests   Final    BOTTLES DRAWN AEROBIC AND ANAEROBIC Blood Culture adequate volume   Culture  Setup Time   Final    GRAM POSITIVE COCCI GRAM NEGATIVE RODS CRITICAL RESULT CALLED TO, READ BACK BY AND VERIFIED WITH: PHARMD CAREN AMEND ON 04/26/23 @ 1737 BY DRT    Culture (A)  Final    PSEUDOMONAS AERUGINOSA STAPHYLOCOCCUS AUREUS SUSCEPTIBILITIES PERFORMED ON PREVIOUS CULTURE WITHIN THE LAST 5 DAYS. Performed at [facility], [address] [place], [place] [address]    Report Status 04/29/2023 FINAL  Final  Respiratory (~20 pathogens) panel by PCR     Status: None   Collection Time: 04/26/23  4:11 AM   Specimen: Anterior Nasal Swab; Respiratory  Result Value Ref Range Status   Adenovirus NOT DETECTED NOT DETECTED Final   Coronavirus 229E NOT DETECTED NOT DETECTED Final    Comment: (NOTE) The Coronavirus on the Respiratory Panel, DOES NOT test for the novel  Coronavirus (2019 nCoV)    Coronavirus HKU1 NOT DETECTED NOT DETECTED Final   Coronavirus NL63 NOT DETECTED NOT DETECTED Final   Coronavirus OC43 NOT DETECTED NOT  DETECTED Final   Metapneumovirus NOT DETECTED NOT DETECTED Final   Rhinovirus / Enterovirus NOT DETECTED NOT DETECTED Final   Influenza A NOT DETECTED NOT DETECTED Final   Influenza B NOT DETECTED NOT DETECTED Final   Parainfluenza Virus 1 NOT DETECTED NOT DETECTED Final   Parainfluenza Virus 2 NOT DETECTED NOT DETECTED Final   Parainfluenza Virus 3 NOT DETECTED NOT DETECTED Final   Parainfluenza Virus 4 NOT  DETECTED NOT DETECTED Final   Respiratory Syncytial Virus NOT DETECTED NOT DETECTED Final   Bordetella pertussis NOT DETECTED NOT DETECTED Final   Bordetella Parapertussis NOT DETECTED NOT DETECTED Final   Chlamydophila pneumoniae NOT DETECTED NOT DETECTED Final   Mycoplasma pneumoniae NOT DETECTED NOT DETECTED Final    Comment: Performed at [facility], [address], [place], [place] [address]  SARS Coronavirus 2 by RT PCR (hospital order, performed in [facility] hospital lab) *cepheid single result test* Anterior Nasal Swab     Status: Abnormal   Collection Time: 04/26/23  4:11 AM   Specimen: Anterior Nasal Swab  Result Value Ref Range Status   SARS Coronavirus 2 by RT PCR POSITIVE (A) NEGATIVE Final    Comment: Performed at [facility], [address], [place], [place] [address]  MRSA Next Gen by PCR, Nasal     Status: None   Collection Time: 04/26/23  5:08 AM   Specimen: Nasal Mucosa; Nasal Swab  Result Value Ref Range Status   MRSA by PCR Next Gen NOT DETECTED NOT DETECTED Final    Comment: (NOTE) The GeneXpert MRSA Assay (FDA approved for NASAL specimens only), is one component of a comprehensive MRSA colonization surveillance program. It is not intended to diagnose MRSA infection nor to guide or monitor treatment for MRSA infections. Test performance is not FDA approved in patients less than 66 years old. Performed at [facility], [address], [place], [place] [address]   Culture, blood (Routine X 2) w Reflex to ID Panel     Status: None  (Preliminary result)   Collection Time: 04/28/23  9:43 AM   Specimen: BLOOD LEFT HAND  Result Value Ref Range Status   Specimen Description BLOOD LEFT HAND  Final   Special Requests   Final    BOTTLES DRAWN AEROBIC ONLY Blood Culture adequate volume   Culture   Final    NO GROWTH < 24 HOURS Performed at [facility], [address] [place], [place] [address]    Report Status PENDING  Incomplete  Culture, blood (Routine X 2) w Reflex to ID Panel     Status: None (Preliminary result)   Collection Time: 04/28/23  9:57 AM   Specimen: BLOOD LEFT HAND  Result Value Ref Range Status   Specimen Description BLOOD LEFT HAND  Final   Special Requests   Final    BOTTLES DRAWN AEROBIC ONLY Blood Culture adequate volume   Culture   Final    NO GROWTH < 24 HOURS Performed at [facility], [address] [place], [place] [address]    Report Status PENDING  Incomplete     Serology:   Imaging: If present, new imagings (plain films, ct scans, and mri) have been personally visualized and interpreted; radiology reports have been reviewed. Decision making incorporated into the Impression / Recommendations.  7/5 chest abd pelv ct with chest cta No evidence of pulmonary embolism.   Right lower lobe pneumonia.  Trace right pleural effusion.   Chronic interstitial lung disease, as above.   Moderate hiatal hernia.  Otherwise negative CT abdomen/pelvis.    Raymondo Band, MD [facility] [phone] pager    04/29/2023, 10:30 AM

## 2023-04-29 NOTE — Progress Notes (Addendum)
0800: Pt refusing bath at this time. Refusing SCD's, refusing pressure reducing boots.  0815: Pt placed in chair position in bed

## 2023-04-29 NOTE — Progress Notes (Signed)
Husband brought in record of COVID vaccine. Last vaccine was  [employer] comirnaty [phone] 08/23/2022 [facility] [phone]

## 2023-04-29 NOTE — Progress Notes (Signed)
Pt transported from 2H03 on a non-rebreather to 4E09 with no complications. Upon arrival pt is placed back on HHFNC 50 L 60% FIO2.

## 2023-04-30 ENCOUNTER — Ambulatory Visit: Payer: Medicare Other | Admitting: Primary Care

## 2023-04-30 DIAGNOSIS — U071 COVID-19: Secondary | ICD-10-CM | POA: Diagnosis not present

## 2023-04-30 DIAGNOSIS — A419 Sepsis, unspecified organism: Secondary | ICD-10-CM | POA: Diagnosis not present

## 2023-04-30 DIAGNOSIS — J849 Interstitial pulmonary disease, unspecified: Secondary | ICD-10-CM

## 2023-04-30 DIAGNOSIS — Z515 Encounter for palliative care: Secondary | ICD-10-CM

## 2023-04-30 DIAGNOSIS — A499 Bacterial infection, unspecified: Secondary | ICD-10-CM | POA: Diagnosis not present

## 2023-04-30 LAB — GLUCOSE, CAPILLARY
Glucose-Capillary: 106 mg/dL — ABNORMAL HIGH (ref 70–99)
Glucose-Capillary: 113 mg/dL — ABNORMAL HIGH (ref 70–99)
Glucose-Capillary: 118 mg/dL — ABNORMAL HIGH (ref 70–99)
Glucose-Capillary: 130 mg/dL — ABNORMAL HIGH (ref 70–99)
Glucose-Capillary: 132 mg/dL — ABNORMAL HIGH (ref 70–99)
Glucose-Capillary: 135 mg/dL — ABNORMAL HIGH (ref 70–99)

## 2023-04-30 LAB — PHOSPHORUS: Phosphorus: 2.1 mg/dL — ABNORMAL LOW (ref 2.5–4.6)

## 2023-04-30 LAB — CBC
HCT: 28.3 % — ABNORMAL LOW (ref 36.0–46.0)
Hemoglobin: 8.7 g/dL — ABNORMAL LOW (ref 12.0–15.0)
MCH: 32.5 pg (ref 26.0–34.0)
MCHC: 30.7 g/dL (ref 30.0–36.0)
MCV: 105.6 fL — ABNORMAL HIGH (ref 80.0–100.0)
Platelets: 77 10*3/uL — ABNORMAL LOW (ref 150–400)
RBC: 2.68 MIL/uL — ABNORMAL LOW (ref 3.87–5.11)
RDW: 18.8 % — ABNORMAL HIGH (ref 11.5–15.5)
WBC: 13.1 10*3/uL — ABNORMAL HIGH (ref 4.0–10.5)
nRBC: 0.5 % — ABNORMAL HIGH (ref 0.0–0.2)

## 2023-04-30 LAB — BASIC METABOLIC PANEL
Anion gap: 11 (ref 5–15)
BUN: 22 mg/dL (ref 8–23)
CO2: 22 mmol/L (ref 22–32)
Calcium: 8.7 mg/dL — ABNORMAL LOW (ref 8.9–10.3)
Chloride: 104 mmol/L (ref 98–111)
Creatinine, Ser: 0.68 mg/dL (ref 0.44–1.00)
GFR, Estimated: >60 mL/min (ref >60)
Glucose, Bld: 141 mg/dL — ABNORMAL HIGH (ref 70–99)
Potassium: 3.8 mmol/L (ref 3.5–5.1)
Sodium: 137 mmol/L (ref 135–145)

## 2023-04-30 LAB — MAGNESIUM: Magnesium: 1.9 mg/dL (ref 1.7–2.4)

## 2023-04-30 MED ORDER — MORPHINE SULFATE (CONCENTRATE) 10 MG/0.5ML PO SOLN
2.5000 mg | ORAL | Status: DC | PRN
  Administered 2023-04-30 – 2023-05-02 (×2): 2.6 mg via ORAL
  Filled 2023-04-30 (×2): qty 0.5

## 2023-04-30 MED ORDER — SENNA 8.6 MG PO TABS
1.0000 | ORAL_TABLET | Freq: Every day | ORAL | Status: DC
  Administered 2023-05-01 – 2023-05-04 (×4): 8.6 mg via ORAL
  Filled 2023-04-30 (×5): qty 1

## 2023-04-30 MED ORDER — K PHOS MONO-SOD PHOS DI & MONO 155-852-130 MG PO TABS
500.0000 mg | ORAL_TABLET | Freq: Two times a day (BID) | ORAL | Status: AC
  Administered 2023-05-01 (×2): 500 mg via ORAL
  Filled 2023-04-30 (×3): qty 2

## 2023-04-30 NOTE — Consult Note (Signed)
Consultation Note Date: 04/30/2023   Patient Name: Madison Holden  DOB: [birth year]  MRN: [id number]  Age / Sex: 77 y.o., female  PCP: Henrine Screws, MD Referring Physician: Joneen Roach  Reason for Consultation:  Symptom management- "chronically ill lady with covid pneumonia and bacteremia. Requiring high flow . No escalation. Wish to keep pursuing further care, but may ultimately require palliative. Hoping for some assistance with symptom management for now."  HPI/Patient Profile: 77 y.o. female  with past medical history of ILD, pulmonary fibrosis, rheumatoid arthritis on Enbrel and methotrexate, pulmonary HTN, R heart failure,  admitted on 04/26/2023 with septic shock related to COVID and multi species bacteremia. Initially required levophed but off now. Currently on HFNC 40 L/min 70% FiO2. Palliative medicine consulted for symptom management.   Primary Decision Maker PATIENT - surrogate decision maker is her husband [name].  Discussion: Chart reviewed including labs, progress notes, imaging from this and previous encounters.  Evaluated patient and met at bedside with her and her spouse.  She is significantly SOB at rest. Oxygen sats tend to drop while she is talking.  Prior to admission she was living at home with her spouse. She has had ongoing decline in her functional status since January and then precipitous decline in the two weeks before this admission to the point she was having to use a potty chair as she could not ambulate to the bathroom on her own.  We discussed the chronic and progressive nature of her illness. She shared her thoughts regarding facing her own mortality.  For now her GOC are more time- continue current efforts, but not escalate if she declined further.  Discussed her current SOB- she experiences anxiety with her SOB. SOB all the time and it is interfering with her sleep  at times. She is currently NPO, but hasn't had much appetite.  Reviewed use of oral morphine for SOB symptoms and to increase tolerance of ADL's. She would like to try that.  Has not had BM since admission- has some history with constipation.     SUMMARY OF RECOMMENDATIONS -GOC- continue current efforts to wean off HFNC, IV abx, no escalation if declines -Symptom management- -morphine liquid 2.5mg  po q2hr prn for SOB or anxiety -senna 1 po at bedtime for constipation prophylaxis r/t opioid -PMT will continue to follow    Code Status/Advance Care Planning: DNR   Prognosis:   Unable to determine  Discharge Planning: To Be Determined  Primary Diagnoses: Present on Admission:  Respiratory failure (HCC)  Polymicrobial bacterial infection   Review of Systems  Physical Exam  Vital Signs: BP (!) 102/54 (BP Location: Right Arm)   Pulse 97   Temp 97.7 F (36.5 C) (Oral)   Resp 15   Wt 60.6 kg   SpO2 96%   BMI 27.92 kg/m  Pain Scale: 0-10   Pain Score: 0-No pain   SpO2: SpO2: 96 % O2 Device:SpO2: 96 % O2 Flow Rate: .O2 Flow Rate (L/min): 40 L/min  IO: Intake/output summary:  Intake/Output Summary (  Last 24 hours) at 04/30/2023 1524 Last data filed at 04/30/2023 0543 Gross per 24 hour  Intake 371.95 ml  Output 470 ml  Net -98.05 ml    LBM: Last BM Date :  (UTA) Baseline Weight: Weight: 57.2 kg Most recent weight: Weight: 60.6 kg       Thank you for this consult. Palliative medicine will continue to follow and assist as needed.  Time Total: 90 minutes Greater than 50%  of this time was spent counseling and coordinating care related to the above assessment and plan.  Signed by: Ocie Bob, AGNP-C Palliative Medicine    Please contact Palliative Medicine Team phone at [phone] for questions and concerns.  For individual provider: See Loretha Stapler

## 2023-04-30 NOTE — Progress Notes (Addendum)
PROGRESS NOTE    Madison Holden  UJW:[id number] DOB: 1946-10-02 DOA: 04/26/2023 PCP: Henrine Screws, MD  Outpatient Specialists:     Brief Narrative:  Patient is a 77 year old female with past medical history significant for ILD/pulmonary fibrosis, RA on Enbrel and MTX, PAH and right heart failure.  Patient was admitted with worsening respiratory failure.  Patient also tested positive for COVID.  Patient is on treatment for polymicrobial pneumonia (MSSA and Pseudomonas).  Blood culture grew Staphylococcus and Pseudomonas.  Patient is on IV cefazolin and oral Cipro.  04/30/2023: Patient seen.  No new complaints.  Assessment & Plan:   Principal Problem:   Polymicrobial bacterial infection Active Problems:   Respiratory failure (HCC)   COVID-19 virus infection   PAH (pulmonary artery hypertension) (HCC)   Septic shock (HCC)   Severe septic shock secondary to PNA, POA  MSSA/Pseudomonas bacteremia CTA positive for RLL PNA in the setting for COVID Patient is currently on IV cefazolin and oral Cipro. IV cefepime has been discontinued. Infectious disease is following   Chronic HFpEF with pulmonary hypertension Demand cardiac ischemia -Euvolemic. -intake and output Holding GDMT in the setting of shock   AKI, improved Hypokalemia/hypophosphatemia Patient presented with serum creatinine of 1.5, now improved back to her baseline.  Serum creatinine is 0.68 today Monitor intake and output Avoid nephrotoxic agent Continue aggressive electrolyte supplement   Anemia and thrombocytopenia of critical illness Monitor CBC Platelet count started improving now to 77 up from 56  COVID-19 infection: -Supportive care.  Respiratory failure: -Acute on chronic. -Continue neb treatment. -Supplemental oxygen   Consult placed to palliative medicine.  Patient has made comments to overnight staff she is tired of current therapy and wants to be comfortable She and husband tell me they wish to  continue acute treatment and try to get better.   DVT prophylaxis: Subcutaneous Lovenox Code Status: Full code Family Communication:  Disposition Plan: This will depend on hospital course.   Consultants:  Infectious disease Palliative care  Procedures:  None  Antimicrobials:  IV cefazolin Oral Cipro   Subjective: No new complaints. Still requiring significant amount of oxygen. No fever documented overnight.  Objective: Vitals:   04/30/23 0500 04/30/23 0555 04/30/23 0859 04/30/23 1243  BP:   97/63 (!) 102/54  Pulse:  (!) 103 (!) 102 97  Resp:  19 17 15   Temp:   98 F (36.7 C) 97.7 F (36.5 C)  TempSrc:   Oral Oral  SpO2:  99% 95% 96%  Weight: 60.6 kg       Intake/Output Summary (Last 24 hours) at 04/30/2023 1925 Last data filed at 04/30/2023 1600 Gross per 24 hour  Intake 500.07 ml  Output 250 ml  Net 250.07 ml   Filed Weights   04/28/23 0618 04/29/23 0700 04/30/23 0500  Weight: 58.7 kg 57.1 kg 60.6 kg    Examination:  General exam: Ill looking. Respiratory system: Clear to auscultation.   Cardiovascular system: S1 & S2 heard Gastrointestinal system: Abdomen is soft and nontender.  Central nervous system: Alert and oriented.  Extremities: No significant edema.  Data Reviewed: I have personally reviewed following labs and imaging studies  CBC: Recent Labs  Lab 04/26/23 0434 04/26/23 0516 04/27/23 0757 04/27/23 0910 04/28/23 0021 04/29/23 0224 04/30/23 0117  WBC 30.7*  --  32.1*  --  21.6* 18.5* 13.1*  NEUTROABS  --   --   --   --  17.8*  --   --   HGB 11.2*   < >  10.4* 10.9* 9.9* 9.0* 8.7*  HCT 36.9   < > 32.3* 32.0* 30.1* 29.5* 28.3*  MCV 108.5*  --  105.6*  --  102.7* 105.7* 105.6*  PLT 149*  --  56*  --  70* 61* 77*   < > = values in this interval not displayed.   Basic Metabolic Panel: Recent Labs  Lab 04/26/23 0434 04/26/23 0516 04/27/23 0757 04/27/23 0910 04/28/23 1000 04/29/23 0224 04/30/23 0117  NA 140   < > 141 141 141 138  137  K 3.6   < > 3.4* 3.3* 3.6 3.7 3.8  CL 106  --  106  --  105 105 104  CO2 23  --  23  --  23 22 22   GLUCOSE 206*  --  157*  --  129* 129* 141*  BUN 39*  --  27*  --  19 20 22   CREATININE 1.39*  --  0.57  --  0.61 0.69 0.68  CALCIUM 8.3*  --  8.6*  --  8.5* 8.6* 8.7*  MG 1.4*  --  1.9  --  2.0  --  1.9  PHOS  --   --  1.5*  --  2.3*  --  2.1*   < > = values in this interval not displayed.   GFR: Estimated Creatinine Clearance: 47.5 mL/min (by C-G formula based on SCr of 0.68 mg/dL). Liver Function Tests: Recent Labs  Lab 04/26/23 0120 04/28/23 1000  AST 31 25  ALT 13 12  ALKPHOS 87 87  BILITOT 2.8* 2.0*  PROT 6.4* 5.1*  ALBUMIN 3.3* 2.5*   No results for input(s): "LIPASE", "AMYLASE" in the last 168 hours. No results for input(s): "AMMONIA" in the last 168 hours. Coagulation Profile: Recent Labs  Lab 04/26/23 0120  INR 1.3*   Cardiac Enzymes: No results for input(s): "CKTOTAL", "CKMB", "CKMBINDEX", "TROPONINI" in the last 168 hours. BNP (last 3 results) Recent Labs    11/07/22 1125  PROBNP 546.0*   HbA1C: No results for input(s): "HGBA1C" in the last 72 hours. CBG: Recent Labs  Lab 04/29/23 2321 04/30/23 0415 04/30/23 0901 04/30/23 1243 04/30/23 1549  GLUCAP 101* 113* 118* 132* 135*   Lipid Profile: No results for input(s): "CHOL", "HDL", "LDLCALC", "TRIG", "CHOLHDL", "LDLDIRECT" in the last 72 hours. Thyroid Function Tests: No results for input(s): "TSH", "T4TOTAL", "FREET4", "T3FREE", "THYROIDAB" in the last 72 hours. Anemia Panel: No results for input(s): "VITAMINB12", "FOLATE", "FERRITIN", "TIBC", "IRON", "RETICCTPCT" in the last 72 hours. Urine analysis:    Component Value Date/Time   COLORURINE AMBER (A) 04/26/2023 1530   APPEARANCEUR HAZY (A) 04/26/2023 1530   LABSPEC 1.042 (H) 04/26/2023 1530   PHURINE 5.0 04/26/2023 1530   GLUCOSEU >=500 (A) 04/26/2023 1530   HGBUR NEGATIVE 04/26/2023 1530   BILIRUBINUR NEGATIVE 04/26/2023 1530    KETONESUR NEGATIVE 04/26/2023 1530   PROTEINUR 30 (A) 04/26/2023 1530   NITRITE NEGATIVE 04/26/2023 1530   LEUKOCYTESUR NEGATIVE 04/26/2023 1530   Sepsis Labs: @LABRCNTIP (procalcitonin:4,lacticidven:4)  ) Recent Results (from the past 240 hour(s))  Culture, blood (Routine X 2) w Reflex to ID Panel     Status: Abnormal   Collection Time: 04/26/23  1:20 AM   Specimen: BLOOD  Result Value Ref Range Status   Specimen Description BLOOD SITE NOT SPECIFIED  Final   Special Requests   Final    BOTTLES DRAWN AEROBIC AND ANAEROBIC Blood Culture adequate volume   Culture  Setup Time   Final    GRAM POSITIVE  COCCI GRAM NEGATIVE RODS IN BOTH AEROBIC AND ANAEROBIC BOTTLES CRITICAL RESULT CALLED TO, READ BACK BY AND VERIFIED WITH: PHARMD CAREN AMEND ON 04/26/23 @ 1737 BY DRT Performed at [facility], [address], [place], [place] [address]    Culture PSEUDOMONAS AERUGINOSA STAPHYLOCOCCUS AUREUS  (A)  Final   Report Status 04/29/2023 FINAL  Final   Organism ID, Bacteria PSEUDOMONAS AERUGINOSA  Final   Organism ID, Bacteria STAPHYLOCOCCUS AUREUS  Final      Susceptibility   Pseudomonas aeruginosa - MIC*    CEFTAZIDIME 4 SENSITIVE Sensitive     CIPROFLOXACIN <=0.25 SENSITIVE Sensitive     GENTAMICIN <=1 SENSITIVE Sensitive     IMIPENEM 1 SENSITIVE Sensitive     PIP/TAZO 8 SENSITIVE Sensitive     CEFEPIME 2 SENSITIVE Sensitive     * PSEUDOMONAS AERUGINOSA   Staphylococcus aureus - MIC*    CIPROFLOXACIN <=0.5 SENSITIVE Sensitive     ERYTHROMYCIN <=0.25 SENSITIVE Sensitive     GENTAMICIN <=0.5 SENSITIVE Sensitive     OXACILLIN 0.5 SENSITIVE Sensitive     TETRACYCLINE <=1 SENSITIVE Sensitive     VANCOMYCIN 1 SENSITIVE Sensitive     TRIMETH/SULFA <=10 SENSITIVE Sensitive     CLINDAMYCIN <=0.25 SENSITIVE Sensitive     RIFAMPIN <=0.5 SENSITIVE Sensitive     Inducible Clindamycin NEGATIVE Sensitive     LINEZOLID 2 SENSITIVE Sensitive     * STAPHYLOCOCCUS AUREUS  Blood Culture ID  Panel (Reflexed)     Status: Abnormal   Collection Time: 04/26/23  1:20 AM  Result Value Ref Range Status   Enterococcus faecalis NOT DETECTED NOT DETECTED Final   Enterococcus Faecium NOT DETECTED NOT DETECTED Final   Listeria monocytogenes NOT DETECTED NOT DETECTED Final   Staphylococcus species DETECTED (A) NOT DETECTED Final    Comment: CRITICAL RESULT CALLED TO, READ BACK BY AND VERIFIED WITH: PHARMD CAREN AMEND ON 04/26/23 @ 1737 BY DRT    Staphylococcus aureus (BCID) DETECTED (A) NOT DETECTED Final    Comment: CRITICAL RESULT CALLED TO, READ BACK BY AND VERIFIED WITH: PHARMD CAREN AMEND ON 04/26/23 @ 1737 BY DRT    Staphylococcus epidermidis NOT DETECTED NOT DETECTED Final   Staphylococcus lugdunensis NOT DETECTED NOT DETECTED Final   Streptococcus species NOT DETECTED NOT DETECTED Final   Streptococcus agalactiae NOT DETECTED NOT DETECTED Final   Streptococcus pneumoniae NOT DETECTED NOT DETECTED Final   Streptococcus pyogenes NOT DETECTED NOT DETECTED Final   A.calcoaceticus-baumannii NOT DETECTED NOT DETECTED Final   Bacteroides fragilis NOT DETECTED NOT DETECTED Final   Enterobacterales NOT DETECTED NOT DETECTED Final   Enterobacter cloacae complex NOT DETECTED NOT DETECTED Final   Escherichia coli NOT DETECTED NOT DETECTED Final   Klebsiella aerogenes NOT DETECTED NOT DETECTED Final   Klebsiella oxytoca NOT DETECTED NOT DETECTED Final   Klebsiella pneumoniae NOT DETECTED NOT DETECTED Final   Proteus species NOT DETECTED NOT DETECTED Final   Salmonella species NOT DETECTED NOT DETECTED Final   Serratia marcescens NOT DETECTED NOT DETECTED Final   Haemophilus influenzae NOT DETECTED NOT DETECTED Final   Neisseria meningitidis NOT DETECTED NOT DETECTED Final   Pseudomonas aeruginosa DETECTED (A) NOT DETECTED Final    Comment: CRITICAL RESULT CALLED TO, READ BACK BY AND VERIFIED WITH: PHARMD CAREN AMEND ON 04/26/23 @ 1737 BY DRT    Stenotrophomonas maltophilia NOT DETECTED NOT  DETECTED Final   Candida albicans NOT DETECTED NOT DETECTED Final   Candida auris NOT DETECTED NOT DETECTED Final  Candida glabrata NOT DETECTED NOT DETECTED Final   Candida krusei NOT DETECTED NOT DETECTED Final   Candida parapsilosis NOT DETECTED NOT DETECTED Final   Candida tropicalis NOT DETECTED NOT DETECTED Final   Cryptococcus neoformans/gattii NOT DETECTED NOT DETECTED Final   CTX-M ESBL NOT DETECTED NOT DETECTED Final   Carbapenem resistance IMP NOT DETECTED NOT DETECTED Final   Carbapenem resistance KPC NOT DETECTED NOT DETECTED Final   Meth resistant mecA/C and MREJ NOT DETECTED NOT DETECTED Final   Carbapenem resistance NDM NOT DETECTED NOT DETECTED Final   Carbapenem resistance VIM NOT DETECTED NOT DETECTED Final    Comment: Performed at [facility], [address] [place], [place] [address]  Culture, blood (Routine X 2) w Reflex to ID Panel     Status: Abnormal   Collection Time: 04/26/23  1:50 AM   Specimen: BLOOD LEFT ARM  Result Value Ref Range Status   Specimen Description BLOOD LEFT ARM  Final   Special Requests   Final    BOTTLES DRAWN AEROBIC AND ANAEROBIC Blood Culture adequate volume   Culture  Setup Time   Final    GRAM POSITIVE COCCI GRAM NEGATIVE RODS CRITICAL RESULT CALLED TO, READ BACK BY AND VERIFIED WITH: PHARMD CAREN AMEND ON 04/26/23 @ 1737 BY DRT    Culture (A)  Final    PSEUDOMONAS AERUGINOSA STAPHYLOCOCCUS AUREUS SUSCEPTIBILITIES PERFORMED ON PREVIOUS CULTURE WITHIN THE LAST 5 DAYS. Performed at [facility], [address], [place], [place] [address]    Report Status 04/29/2023 FINAL  Final  Respiratory (~20 pathogens) panel by PCR     Status: None   Collection Time: 04/26/23  4:11 AM   Specimen: Anterior Nasal Swab; Respiratory  Result Value Ref Range Status   Adenovirus NOT DETECTED NOT DETECTED Final   Coronavirus 229E NOT DETECTED NOT DETECTED Final    Comment: (NOTE) The Coronavirus on the Respiratory Panel, DOES NOT test  for the novel  Coronavirus (2019 nCoV)    Coronavirus HKU1 NOT DETECTED NOT DETECTED Final   Coronavirus NL63 NOT DETECTED NOT DETECTED Final   Coronavirus OC43 NOT DETECTED NOT DETECTED Final   Metapneumovirus NOT DETECTED NOT DETECTED Final   Rhinovirus / Enterovirus NOT DETECTED NOT DETECTED Final   Influenza A NOT DETECTED NOT DETECTED Final   Influenza B NOT DETECTED NOT DETECTED Final   Parainfluenza Virus 1 NOT DETECTED NOT DETECTED Final   Parainfluenza Virus 2 NOT DETECTED NOT DETECTED Final   Parainfluenza Virus 3 NOT DETECTED NOT DETECTED Final   Parainfluenza Virus 4 NOT DETECTED NOT DETECTED Final   Respiratory Syncytial Virus NOT DETECTED NOT DETECTED Final   Bordetella pertussis NOT DETECTED NOT DETECTED Final   Bordetella Parapertussis NOT DETECTED NOT DETECTED Final   Chlamydophila pneumoniae NOT DETECTED NOT DETECTED Final   Mycoplasma pneumoniae NOT DETECTED NOT DETECTED Final    Comment: Performed at [facility], [address] [place], [place] [address]  SARS Coronavirus 2 by RT PCR (hospital order, performed in [facility] hospital lab) *cepheid single result test* Anterior Nasal Swab     Status: Abnormal   Collection Time: 04/26/23  4:11 AM   Specimen: Anterior Nasal Swab  Result Value Ref Range Status   SARS Coronavirus 2 by RT PCR POSITIVE (A) NEGATIVE Final    Comment: Performed at [facility], [address], [place], [place] [address]  MRSA Next Gen by PCR, Nasal     Status: None  Collection Time: 04/26/23  5:08 AM   Specimen: Nasal Mucosa; Nasal Swab  Result Value Ref Range Status   MRSA by PCR Next Gen NOT DETECTED NOT DETECTED Final    Comment: (NOTE) The GeneXpert MRSA Assay (FDA approved for NASAL specimens only), is one component of a comprehensive MRSA colonization surveillance program. It is not intended to diagnose MRSA infection nor to guide or monitor treatment for MRSA infections. Test performance is not FDA approved in  patients less than 6 years old. Performed at [facility], [address], [place], [place] [address]   Culture, blood (Routine X 2) w Reflex to ID Panel     Status: None (Preliminary result)   Collection Time: 04/28/23  9:43 AM   Specimen: BLOOD LEFT HAND  Result Value Ref Range Status   Specimen Description BLOOD LEFT HAND  Final   Special Requests   Final    BOTTLES DRAWN AEROBIC ONLY Blood Culture adequate volume   Culture   Final    NO GROWTH 2 DAYS Performed at [facility], [address], [place], [place] [address]    Report Status PENDING  Incomplete  Culture, blood (Routine X 2) w Reflex to ID Panel     Status: None (Preliminary result)   Collection Time: 04/28/23  9:57 AM   Specimen: BLOOD LEFT HAND  Result Value Ref Range Status   Specimen Description BLOOD LEFT HAND  Final   Special Requests   Final    BOTTLES DRAWN AEROBIC ONLY Blood Culture adequate volume   Culture   Final    NO GROWTH 2 DAYS Performed at [facility], [address], [place], [place] [address]    Report Status PENDING  Incomplete         Radiology Studies: No results found.      Scheduled Meds:  arformoterol  15 mcg Nebulization BID   budesonide (PULMICORT) nebulizer solution  0.25 mg Nebulization BID   Chlorhexidine Gluconate Cloth  6 each Topical Q0600   ciprofloxacin  750 mg Oral BID   enoxaparin (LOVENOX) injection  40 mg Subcutaneous Q24H   fluticasone  1 spray Each Nare Daily   haloperidol lactate  1 mg Intravenous Once   insulin aspart  0-15 Units Subcutaneous Q4H   methylPREDNISolone (SOLU-MEDROL) injection  120 mg Intravenous Q24H   revefenacin  175 mcg Nebulization Daily   senna  1 tablet Oral QHS   Continuous Infusions:  sodium chloride Stopped (04/27/23 1426)    ceFAZolin (ANCEF) IV 2 g (04/30/23 1247)     LOS: 4 days    Time spent: 35 minutes    Berton Mount, MD  [facility] Pager #: [phone] 7PM-7AM contact night  coverage as above

## 2023-04-30 NOTE — Care Management Important Message (Signed)
Important Message  Patient Details  Name: Madison Holden MRN: [id number] Date of Birth: 12/30/1945   Medicare Important Message Given:  Yes     Renie Ora 04/30/2023, 8:30 AM

## 2023-04-30 NOTE — Progress Notes (Signed)
Patients husband educated on importance of calling for help using the call bell. Call bell plugged in and working properly. RN found husband pushing buttons on the IV pump, when asked he stated "trying to stop the beeping."   Kenard Gower, RN

## 2023-05-01 DIAGNOSIS — Z7189 Other specified counseling: Secondary | ICD-10-CM

## 2023-05-01 DIAGNOSIS — B9561 Methicillin susceptible Staphylococcus aureus infection as the cause of diseases classified elsewhere: Secondary | ICD-10-CM | POA: Diagnosis not present

## 2023-05-01 DIAGNOSIS — U071 COVID-19: Secondary | ICD-10-CM | POA: Diagnosis not present

## 2023-05-01 DIAGNOSIS — B965 Pseudomonas (aeruginosa) (mallei) (pseudomallei) as the cause of diseases classified elsewhere: Secondary | ICD-10-CM | POA: Diagnosis not present

## 2023-05-01 DIAGNOSIS — J849 Interstitial pulmonary disease, unspecified: Secondary | ICD-10-CM | POA: Diagnosis not present

## 2023-05-01 DIAGNOSIS — J1282 Pneumonia due to coronavirus disease 2019: Secondary | ICD-10-CM | POA: Diagnosis not present

## 2023-05-01 DIAGNOSIS — A499 Bacterial infection, unspecified: Secondary | ICD-10-CM | POA: Diagnosis not present

## 2023-05-01 LAB — GLUCOSE, CAPILLARY
Glucose-Capillary: 150 mg/dL — ABNORMAL HIGH (ref 70–99)
Glucose-Capillary: 109 mg/dL — ABNORMAL HIGH (ref 70–99)
Glucose-Capillary: 115 mg/dL — ABNORMAL HIGH (ref 70–99)
Glucose-Capillary: 100 mg/dL — ABNORMAL HIGH (ref 70–99)

## 2023-05-01 LAB — RENAL FUNCTION PANEL
Albumin: 2.7 g/dL — ABNORMAL LOW (ref 3.5–5.0)
Anion gap: 14 (ref 5–15)
BUN: 25 mg/dL — ABNORMAL HIGH (ref 8–23)
CO2: 22 mmol/L (ref 22–32)
Calcium: 8.9 mg/dL (ref 8.9–10.3)
Chloride: 99 mmol/L (ref 98–111)
Creatinine, Ser: 0.77 mg/dL (ref 0.44–1.00)
GFR, Estimated: >60 mL/min (ref >60)
Glucose, Bld: 109 mg/dL — ABNORMAL HIGH (ref 70–99)
Phosphorus: 2.6 mg/dL (ref 2.5–4.6)
Potassium: 3.8 mmol/L (ref 3.5–5.1)
Sodium: 135 mmol/L (ref 135–145)

## 2023-05-01 LAB — BASIC METABOLIC PANEL
Anion gap: 14 (ref 5–15)
BUN: 25 mg/dL — ABNORMAL HIGH (ref 8–23)
CO2: 24 mmol/L (ref 22–32)
Calcium: 9 mg/dL (ref 8.9–10.3)
Chloride: 99 mmol/L (ref 98–111)
Creatinine, Ser: 0.82 mg/dL (ref 0.44–1.00)
GFR, Estimated: >60 mL/min (ref >60)
Glucose, Bld: 110 mg/dL — ABNORMAL HIGH (ref 70–99)
Potassium: 3.8 mmol/L (ref 3.5–5.1)
Sodium: 137 mmol/L (ref 135–145)

## 2023-05-01 LAB — CBC WITH DIFFERENTIAL/PLATELET
Abs Immature Granulocytes: 0.59 10*3/uL — ABNORMAL HIGH (ref 0.00–0.07)
Basophils Absolute: 0 10*3/uL (ref 0.0–0.1)
Basophils Relative: 0 %
Eosinophils Absolute: 0 10*3/uL (ref 0.0–0.5)
Eosinophils Relative: 0 %
HCT: 30.6 % — ABNORMAL LOW (ref 36.0–46.0)
Hemoglobin: 9.2 g/dL — ABNORMAL LOW (ref 12.0–15.0)
Immature Granulocytes: 5 %
Lymphocytes Relative: 5 %
Lymphs Abs: 0.6 10*3/uL — ABNORMAL LOW (ref 0.7–4.0)
MCH: 32.5 pg (ref 26.0–34.0)
MCHC: 30.1 g/dL (ref 30.0–36.0)
MCV: 108.1 fL — ABNORMAL HIGH (ref 80.0–100.0)
Monocytes Absolute: 0.8 10*3/uL (ref 0.1–1.0)
Monocytes Relative: 6 %
Neutro Abs: 11 10*3/uL — ABNORMAL HIGH (ref 1.7–7.7)
Neutrophils Relative %: 84 %
Platelets: 107 10*3/uL — ABNORMAL LOW (ref 150–400)
RBC: 2.83 MIL/uL — ABNORMAL LOW (ref 3.87–5.11)
RDW: 18.7 % — ABNORMAL HIGH (ref 11.5–15.5)
WBC: 13.1 10*3/uL — ABNORMAL HIGH (ref 4.0–10.5)
nRBC: 0.5 % — ABNORMAL HIGH (ref 0.0–0.2)

## 2023-05-01 LAB — MAGNESIUM: Magnesium: 1.8 mg/dL (ref 1.7–2.4)

## 2023-05-01 MED ORDER — CIPROFLOXACIN HCL 500 MG PO TABS
750.0000 mg | ORAL_TABLET | Freq: Two times a day (BID) | ORAL | Status: DC
  Administered 2023-05-01 – 2023-05-04 (×7): 750 mg via ORAL
  Filled 2023-05-01 (×8): qty 2

## 2023-05-01 MED ORDER — TRAZODONE HCL 50 MG PO TABS
50.0000 mg | ORAL_TABLET | Freq: Every day | ORAL | Status: DC
  Administered 2023-05-01: 50 mg via ORAL
  Filled 2023-05-01: qty 1

## 2023-05-01 MED ORDER — INSULIN ASPART 100 UNIT/ML IJ SOLN
0.0000 [IU] | Freq: Three times a day (TID) | INTRAMUSCULAR | Status: DC
  Administered 2023-05-01 – 2023-05-04 (×4): 2 [IU] via SUBCUTANEOUS

## 2023-05-01 MED ORDER — CIPROFLOXACIN IN D5W 400 MG/200ML IV SOLN
400.0000 mg | Freq: Two times a day (BID) | INTRAVENOUS | Status: DC
  Filled 2023-05-01: qty 200

## 2023-05-01 MED ORDER — INSULIN ASPART 100 UNIT/ML IJ SOLN: 0.0000 [IU] | Freq: Every day | INTRAMUSCULAR | Status: DC

## 2023-05-01 NOTE — Progress Notes (Signed)
[facility]  Date of Admission:  04/26/2023     CC: Covid pna/infection Polymicrobial bsi (mssa/pseudomonas)  Lines: Peripheral iv's  Abx: 7/8-c cipro 7/8-c cefazolin  7/5-7/8 cefepime 7/5 ceftriaxone/azith  ASSESSMENT: RA with interstitial lung disease, patient on methotrexate and enbrel admitted with septic shock found to have covid infection (cycle threshold 13) and mssa/pseudomonas bacteremia  7/5 pan-body ct interstitial opacities and right lower lobe pna  No focal pain in terms of joint/back involvement and no prosthesis   --- 7/10 assessment Transferred to floor 7/08 Improving o2 requirement although remains on 10 l hfnc today No other complaint  No sign of chf  7/7 repeat bcx ngtd  Tte no obvious sign of endocarditis; deferring tee due to chronically tenuous respiratory status in setting ild from RA  PLAN: Continue iv cefazolin and cipro for now Next week plan to transition cefazolin to linezolid to finish 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; would finish cipro on 7/16 Covid management per primary team Discussed with primary team  I spent more than 50 minute reviewing data/chart, and coordinating care, providing direct face to face time providing counseling/discussing diagnostics/treatment plan with patient and treatment team     Principal Problem:   Polymicrobial bacterial infection Active Problems:   Respiratory failure (HCC)   COVID-19 virus infection   PAH (pulmonary artery hypertension) (HCC)   Septic shock (HCC)   Allergies  Allergen Reactions   Lovastatin     myalgia    Scheduled Meds:  arformoterol  15 mcg Nebulization BID   budesonide (PULMICORT) nebulizer solution  0.25 mg Nebulization BID   Chlorhexidine Gluconate Cloth  6 each Topical Q0600   enoxaparin (LOVENOX) injection  40 mg Subcutaneous Q24H   fluticasone  1 spray Each Nare Daily   haloperidol lactate  1 mg Intravenous  Once   insulin aspart  0-15 Units Subcutaneous TID WC   insulin aspart  0-5 Units Subcutaneous QHS   methylPREDNISolone (SOLU-MEDROL) injection  120 mg Intravenous Q24H   phosphorus  500 mg Oral BID   revefenacin  175 mcg Nebulization Daily   senna  1 tablet Oral QHS   traZODone  50 mg Oral QHS   Continuous Infusions:  sodium chloride Stopped (04/27/23 1426)    ceFAZolin (ANCEF) IV 2 g (05/01/23 0541)   ciprofloxacin     PRN Meds:.docusate sodium, morphine injection, morphine CONCENTRATE, polyethylene glycol   SUBJECTIVE: On floor Doing well Not oob yet Afebrile No complaint   Review of Systems: ROS All other ROS was negative, except mentioned above     OBJECTIVE: Vitals:   05/01/23 0606 05/01/23 0856 05/01/23 1141 05/01/23 1503  BP:  111/63 112/63 99/61  Pulse:  (!) 106 98 100  Resp:  18 17 17   Temp:  98.3 F (36.8 C) 98 F (36.7 C) 98 F (36.7 C)  TempSrc:  Oral Oral Oral  SpO2:  95% 97% 98%  Weight: 61.3 kg      Body mass index is 28.24 kg/m.  Physical Exam General/constitutional: no distress, pleasant HEENT: Normocephalic, PER, Conj Clear, EOMI, Oropharynx clear Neck supple CV: rrr no mrg Lungs: clear to auscultation, normal respiratory effort; on 10 liters hfnc Abd: Soft, Nontender Ext: no edema Skin: No Rash Neuro: nonfocal MSK: no peripheral joint swelling/tenderness/warmth; back spines nontender      Lab Results Lab Results  Component Value Date   WBC 13.1 (H) 05/01/2023   HGB 9.2 (L) 05/01/2023  HCT 30.6 (L) 05/01/2023   MCV 108.1 (H) 05/01/2023   PLT 107 (L) 05/01/2023    Lab Results  Component Value Date   CREATININE 0.77 05/01/2023   BUN 25 (H) 05/01/2023   NA 135 05/01/2023   K 3.8 05/01/2023   CL 99 05/01/2023   CO2 22 05/01/2023    Lab Results  Component Value Date   ALT 12 04/28/2023   AST 25 04/28/2023   ALKPHOS 87 04/28/2023   BILITOT 2.0 (H) 04/28/2023      Microbiology: Recent Results (from the past 240  hour(s))  Culture, blood (Routine X 2) w Reflex to ID Panel     Status: Abnormal   Collection Time: 04/26/23  1:20 AM   Specimen: BLOOD  Result Value Ref Range Status   Specimen Description BLOOD SITE NOT SPECIFIED  Final   Special Requests   Final    BOTTLES DRAWN AEROBIC AND ANAEROBIC Blood Culture adequate volume   Culture  Setup Time   Final    GRAM POSITIVE COCCI GRAM NEGATIVE RODS IN BOTH AEROBIC AND ANAEROBIC BOTTLES CRITICAL RESULT CALLED TO, READ BACK BY AND VERIFIED WITH: PHARMD CAREN AMEND ON 04/26/23 @ 1737 BY DRT Performed at [facility], [address] [place], [place] [address]    Culture PSEUDOMONAS AERUGINOSA STAPHYLOCOCCUS AUREUS  (A)  Final   Report Status 04/29/2023 FINAL  Final   Organism ID, Bacteria PSEUDOMONAS AERUGINOSA  Final   Organism ID, Bacteria STAPHYLOCOCCUS AUREUS  Final      Susceptibility   Pseudomonas aeruginosa - MIC*    CEFTAZIDIME 4 SENSITIVE Sensitive     CIPROFLOXACIN <=0.25 SENSITIVE Sensitive     GENTAMICIN <=1 SENSITIVE Sensitive     IMIPENEM 1 SENSITIVE Sensitive     PIP/TAZO 8 SENSITIVE Sensitive     CEFEPIME 2 SENSITIVE Sensitive     * PSEUDOMONAS AERUGINOSA   Staphylococcus aureus - MIC*    CIPROFLOXACIN <=0.5 SENSITIVE Sensitive     ERYTHROMYCIN <=0.25 SENSITIVE Sensitive     GENTAMICIN <=0.5 SENSITIVE Sensitive     OXACILLIN 0.5 SENSITIVE Sensitive     TETRACYCLINE <=1 SENSITIVE Sensitive     VANCOMYCIN 1 SENSITIVE Sensitive     TRIMETH/SULFA <=10 SENSITIVE Sensitive     CLINDAMYCIN <=0.25 SENSITIVE Sensitive     RIFAMPIN <=0.5 SENSITIVE Sensitive     Inducible Clindamycin NEGATIVE Sensitive     LINEZOLID 2 SENSITIVE Sensitive     * STAPHYLOCOCCUS AUREUS  Blood Culture ID Panel (Reflexed)     Status: Abnormal   Collection Time: 04/26/23  1:20 AM  Result Value Ref Range Status   Enterococcus faecalis NOT DETECTED NOT DETECTED Final   Enterococcus Faecium NOT DETECTED NOT DETECTED Final   Listeria monocytogenes NOT  DETECTED NOT DETECTED Final   Staphylococcus species DETECTED (A) NOT DETECTED Final    Comment: CRITICAL RESULT CALLED TO, READ BACK BY AND VERIFIED WITH: PHARMD CAREN AMEND ON 04/26/23 @ 1737 BY DRT    Staphylococcus aureus (BCID) DETECTED (A) NOT DETECTED Final    Comment: CRITICAL RESULT CALLED TO, READ BACK BY AND VERIFIED WITH: PHARMD CAREN AMEND ON 04/26/23 @ 1737 BY DRT    Staphylococcus epidermidis NOT DETECTED NOT DETECTED Final   Staphylococcus lugdunensis NOT DETECTED NOT DETECTED Final   Streptococcus species NOT DETECTED NOT DETECTED Final   Streptococcus agalactiae NOT DETECTED NOT DETECTED Final   Streptococcus pneumoniae NOT DETECTED NOT DETECTED Final   Streptococcus pyogenes NOT DETECTED NOT DETECTED Final  A.calcoaceticus-baumannii NOT DETECTED NOT DETECTED Final   Bacteroides fragilis NOT DETECTED NOT DETECTED Final   Enterobacterales NOT DETECTED NOT DETECTED Final   Enterobacter cloacae complex NOT DETECTED NOT DETECTED Final   Escherichia coli NOT DETECTED NOT DETECTED Final   Klebsiella aerogenes NOT DETECTED NOT DETECTED Final   Klebsiella oxytoca NOT DETECTED NOT DETECTED Final   Klebsiella pneumoniae NOT DETECTED NOT DETECTED Final   Proteus species NOT DETECTED NOT DETECTED Final   Salmonella species NOT DETECTED NOT DETECTED Final   Serratia marcescens NOT DETECTED NOT DETECTED Final   Haemophilus influenzae NOT DETECTED NOT DETECTED Final   Neisseria meningitidis NOT DETECTED NOT DETECTED Final   Pseudomonas aeruginosa DETECTED (A) NOT DETECTED Final    Comment: CRITICAL RESULT CALLED TO, READ BACK BY AND VERIFIED WITH: PHARMD CAREN AMEND ON 04/26/23 @ 1737 BY DRT    Stenotrophomonas maltophilia NOT DETECTED NOT DETECTED Final   Candida albicans NOT DETECTED NOT DETECTED Final   Candida auris NOT DETECTED NOT DETECTED Final   Candida glabrata NOT DETECTED NOT DETECTED Final   Candida krusei NOT DETECTED NOT DETECTED Final   Candida parapsilosis NOT  DETECTED NOT DETECTED Final   Candida tropicalis NOT DETECTED NOT DETECTED Final   Cryptococcus neoformans/gattii NOT DETECTED NOT DETECTED Final   CTX-M ESBL NOT DETECTED NOT DETECTED Final   Carbapenem resistance IMP NOT DETECTED NOT DETECTED Final   Carbapenem resistance KPC NOT DETECTED NOT DETECTED Final   Meth resistant mecA/C and MREJ NOT DETECTED NOT DETECTED Final   Carbapenem resistance NDM NOT DETECTED NOT DETECTED Final   Carbapenem resistance VIM NOT DETECTED NOT DETECTED Final    Comment: Performed at [facility], [address] [place], [place] [address]  Culture, blood (Routine X 2) w Reflex to ID Panel     Status: Abnormal   Collection Time: 04/26/23  1:50 AM   Specimen: BLOOD LEFT ARM  Result Value Ref Range Status   Specimen Description BLOOD LEFT ARM  Final   Special Requests   Final    BOTTLES DRAWN AEROBIC AND ANAEROBIC Blood Culture adequate volume   Culture  Setup Time   Final    GRAM POSITIVE COCCI GRAM NEGATIVE RODS CRITICAL RESULT CALLED TO, READ BACK BY AND VERIFIED WITH: PHARMD CAREN AMEND ON 04/26/23 @ 1737 BY DRT    Culture (A)  Final    PSEUDOMONAS AERUGINOSA STAPHYLOCOCCUS AUREUS SUSCEPTIBILITIES PERFORMED ON PREVIOUS CULTURE WITHIN THE LAST 5 DAYS. Performed at [facility], [address], [place], [place] [address]    Report Status 04/29/2023 FINAL  Final  Respiratory (~20 pathogens) panel by PCR     Status: None   Collection Time: 04/26/23  4:11 AM   Specimen: Anterior Nasal Swab; Respiratory  Result Value Ref Range Status   Adenovirus NOT DETECTED NOT DETECTED Final   Coronavirus 229E NOT DETECTED NOT DETECTED Final    Comment: (NOTE) The Coronavirus on the Respiratory Panel, DOES NOT test for the novel  Coronavirus (2019 nCoV)    Coronavirus HKU1 NOT DETECTED NOT DETECTED Final   Coronavirus NL63 NOT DETECTED NOT DETECTED Final   Coronavirus OC43 NOT DETECTED NOT DETECTED Final   Metapneumovirus NOT DETECTED NOT DETECTED  Final   Rhinovirus / Enterovirus NOT DETECTED NOT DETECTED Final   Influenza A NOT DETECTED NOT DETECTED Final   Influenza B NOT DETECTED NOT DETECTED Final   Parainfluenza Virus 1 NOT DETECTED NOT DETECTED Final   Parainfluenza Virus 2 NOT DETECTED  NOT DETECTED Final   Parainfluenza Virus 3 NOT DETECTED NOT DETECTED Final   Parainfluenza Virus 4 NOT DETECTED NOT DETECTED Final   Respiratory Syncytial Virus NOT DETECTED NOT DETECTED Final   Bordetella pertussis NOT DETECTED NOT DETECTED Final   Bordetella Parapertussis NOT DETECTED NOT DETECTED Final   Chlamydophila pneumoniae NOT DETECTED NOT DETECTED Final   Mycoplasma pneumoniae NOT DETECTED NOT DETECTED Final    Comment: Performed at [facility], [address], [place], [place] [address]  SARS Coronavirus 2 by RT PCR (hospital order, performed in [facility] hospital lab) *cepheid single result test* Anterior Nasal Swab     Status: Abnormal   Collection Time: 04/26/23  4:11 AM   Specimen: Anterior Nasal Swab  Result Value Ref Range Status   SARS Coronavirus 2 by RT PCR POSITIVE (A) NEGATIVE Final    Comment: Performed at [facility], [address], [place], [place] [address]  MRSA Next Gen by PCR, Nasal     Status: None   Collection Time: 04/26/23  5:08 AM   Specimen: Nasal Mucosa; Nasal Swab  Result Value Ref Range Status   MRSA by PCR Next Gen NOT DETECTED NOT DETECTED Final    Comment: (NOTE) The GeneXpert MRSA Assay (FDA approved for NASAL specimens only), is one component of a comprehensive MRSA colonization surveillance program. It is not intended to diagnose MRSA infection nor to guide or monitor treatment for MRSA infections. Test performance is not FDA approved in patients less than 58 years old. Performed at [facility], [address], [place], [place] [address]   Culture, blood (Routine X 2) w Reflex to ID Panel     Status: None (Preliminary result)   Collection Time: 04/28/23  9:43 AM    Specimen: BLOOD LEFT HAND  Result Value Ref Range Status   Specimen Description BLOOD LEFT HAND  Final   Special Requests   Final    BOTTLES DRAWN AEROBIC ONLY Blood Culture adequate volume   Culture   Final    NO GROWTH 3 DAYS Performed at [facility], [address], [place], [place] [address]    Report Status PENDING  Incomplete  Culture, blood (Routine X 2) w Reflex to ID Panel     Status: None (Preliminary result)   Collection Time: 04/28/23  9:57 AM   Specimen: BLOOD LEFT HAND  Result Value Ref Range Status   Specimen Description BLOOD LEFT HAND  Final   Special Requests   Final    BOTTLES DRAWN AEROBIC ONLY Blood Culture adequate volume   Culture   Final    NO GROWTH 3 DAYS Performed at [facility], [address] [place], [place] [address]    Report Status PENDING  Incomplete     Serology:   Imaging: If present, new imagings (plain films, ct scans, and mri) have been personally visualized and interpreted; radiology reports have been reviewed. Decision making incorporated into the Impression / Recommendations.  7/6 tte  1. Left ventricular ejection fraction, by estimation, is 60 to 65%. The left ventricle has normal function. The left ventricle has no regional wall motion abnormalities. There is mild left ventricular hypertrophy. Left ventricular diastolic parameters  are consistent with Grade I diastolic dysfunction (impaired relaxation). Elevated left ventricular end-diastolic pressure.   2. Right ventricular systolic function is severely reduced. The right ventricular size is severely enlarged.   3. Left atrial size was moderately dilated.   4.  Right atrial size was mildly dilated.   5. The mitral valve is abnormal. Trivial mitral valve regurgitation. No evidence of mitral stenosis. Moderate mitral annular calcification.   6. Tricuspid valve regurgitation is moderate.   7. The aortic valve is tricuspid. There is mild calcification of the aortic valve.  There is mild thickening of the aortic valve. Aortic valve regurgitation is mild. Aortic valve sclerosis is present, with no evidence of aortic valve stenosis.   8. The inferior vena cava is normal in size with greater than 50% respiratory variability, suggesting right atrial pressure of 3 mmHg.     7/5 chest abd pelv ct with chest cta No evidence of pulmonary embolism.   Right lower lobe pneumonia.  Trace right pleural effusion.   Chronic interstitial lung disease, as above.   Moderate hiatal hernia.  Otherwise negative CT abdomen/pelvis.    Raymondo Band, MD [facility] [phone] pager    05/01/2023, 3:07 PM

## 2023-05-01 NOTE — Progress Notes (Addendum)
Daily Progress Note   Patient Name: Madison Holden       Date: 05/01/2023 DOB: [birth year]  Age: 77 y.o. MRN#: [id number] Attending Physician: Burnadette Pop, MD Primary Care Physician: Henrine Screws, MD Admit Date: 04/26/2023  Reason for Consultation/Follow-up: Non pain symptom management  Patient Profile/HPI:  77 y.o. female  with past medical history of ILD, pulmonary fibrosis, rheumatoid arthritis on Enbrel and methotrexate, pulmonary HTN, R heart failure,  admitted on 04/26/2023 with septic shock related to COVID and multi species bacteremia. Initially required levophed but off now. Currently on HFNC 40 L/min 70% FiO2. Palliative medicine consulted for symptom management.    Subjective: Chart reviewed including labs, progress notes, imaging from this and previous encounters.  Met with patient and spouse at bedside.  Oxygen requirements are improving. She has had some episodes of confusion and paranoia- reporting to her husband that someone is trying to steal her cell phone, speaking of appointments that she doesn't have.  RN reports she isn't eating or drinking and is having poor urine output. Patient also agitated this morning, refused medications last night, but took them this morning after RN convinced her.  On eval she is oriented to person and place but answers some questions vaguely. She also became very agitated when I asked her about her appetite. She did not appreciate visit from dietician being so early in the morning.  She reported having a hair appointment and a periodontist appointment.  She did not sleep well last night- too much beeping and people coming in and out of her room. She is anxious at times and somewhat depressed with her current situation.  She shares feeling  "rushed" into interventions that she doesn't want to do when I recommended trial of mirtazapine to increase her appetite, help her sleep, and help improve her mood. She stated she wanted help with sleep and with her mood, but does not want her appetite increased. I offered trazodone and she is will to try this. She did not elaborate when I attempted to prod her on what other interventions she felt forced in to. Her breathing is improved. Morphine has helped.  At close of our discussion she was calmer, she stated she just wanted to rest and spend time with her husband, Madison Holden.     Physical Exam Vitals and  nursing note reviewed.  Cardiovascular:     Rate and Rhythm: Normal rate.  Pulmonary:     Effort: Pulmonary effort is normal.  Skin:    Comments: Diffuse bruising on arms  Neurological:     Mental Status: She is alert and oriented to person, place, and time.  Psychiatric:     Comments: Some confusion             Vital Signs: BP 99/61 (BP Location: Left Arm)   Pulse 100   Temp 98 F (36.7 C) (Oral)   Resp 17   Wt 61.3 kg   SpO2 98%   BMI 28.24 kg/m  SpO2: SpO2: 98 % O2 Device: O2 Device: High Flow Nasal Cannula O2 Flow Rate: O2 Flow Rate (L/min): 30 L/min  Intake/output summary:  Intake/Output Summary (Last 24 hours) at 05/01/2023 1508 Last data filed at 05/01/2023 0915 Gross per 24 hour  Intake 356.51 ml  Output 400 ml  Net -43.49 ml   LBM: Last BM Date :  (UTA) Baseline Weight: Weight: 57.2 kg Most recent weight: Weight: 61.3 kg       Palliative Assessment/Data: PPS: 40%      Patient Active Problem List   Diagnosis Date Noted   Polymicrobial bacterial infection 04/27/2023   Respiratory failure (HCC) 04/26/2023   COVID-19 virus infection 04/26/2023   PAH (pulmonary artery hypertension) (HCC) 04/26/2023   Septic shock (HCC) 04/26/2023   Chronic respiratory failure with hypoxia (HCC) 11/13/2022   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   Abnormal CT scan  11/13/2022   Acute on chronic respiratory failure with hypoxemia (HCC) 11/02/2022   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   ILD (interstitial lung disease) (HCC)    Immunosuppression due to drug therapy (HCC)    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   Endometrial cancer (HCC) 07/22/2018   Acute renal failure (ARF) (HCC) 07/01/2018   Endometrial carcinoma (HCC) 07/01/2018   Gout 07/01/2018   Hyperkalemia 07/01/2018   Morbid obesity (HCC) 06/26/2018   Chest pain 06/26/2018   Pre-operative cardiovascular examination 06/26/2018   Diabetes mellitus (HCC) 06/18/2018   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   Atherosclerosis of aorta (HCC) 05/02/2009   Mixed dyslipidemia 04/22/2009   Essential hypertension 04/15/2008   VENTRICULAR HYPERTROPHY, LEFT 04/15/2008   ESOPHAGEAL STRICTURE 04/15/2008   GERD 04/15/2008    Palliative Care Assessment & Plan    Assessment/Recommendations/Plan  Respiratory failure in the setting of ILD with + COVID and bacteremia- oxygen requirements improving- continue morphine as ordered Poor po intake- likely related to COVID- liberalize diet to regular to expand options, she doesn't want to be forced, recommend leaving water and ice chips at bedside Poor sleep- trazodone 50mg  po at bedtime Depressed and anxious mood- trazodone 50mg  po at bedtime She appears to be developing some hospital related delirium- will continue to monitor   Code Status: DNR  Prognosis:  Unable to determine  Discharge Planning: To Be Determined  Care plan was discussed with patient's nurse.  Thank you for allowing the Palliative Medicine Team to assist in the care of this patient.  Total time:  70 minutes  Greater than 50%  of this time was spent counseling and coordinating care related to the above assessment and plan.  Ocie Bob, AGNP-C Palliative Medicine   Please contact Palliative Medicine Team phone at [phone] for questions and concerns.

## 2023-05-01 NOTE — Progress Notes (Signed)
Initial Nutrition Assessment  DOCUMENTATION CODES:      INTERVENTION:  RD suggests small bore NG feeding tube placement and starting TF's due to patient not taking anything po   Encourage po intake   Attempt to educate about adequate nutrition   RD signing off due to patient not being interested in nutritional interventions at this time. Please re-consult when she is ready    NUTRITION DIAGNOSIS:   Inadequate oral intake related to poor appetite as evidenced by per patient/family report, meal completion < 25%.  GOAL:   Patient will meet greater than or equal to 90% of their needs  MONITOR:   Labs, PO intake, I & O's, Weight trends, Skin, Supplement acceptance  REASON FOR ASSESSMENT:   Consult Assessment of nutrition requirement/status  ASSESSMENT:   77 y.o. female with PMHx including ILD pulmonary fibrosis, rheumatoid arthritis, pulmonary hypertension, right ventricular dysfunction, HF and more who presents with hypoxemia and shortness of breath. Patient found to have COVID pneumonia bacteremia  Visited patient at bedside who seems cognitively impaired in some manner.   RD spoke with RN who reports patient can be verbally abrasive/abusive. RN reports that she is not eating or drinking anything and that her UOP has decreased significantly. RN reports offering her Ensure and she declines that as well.   When RD enetered room to speak with patient she reports she did not want to talk about nutrition at that time and RD insisted due to her extremely poor po intake and rapid weight loss. Her husband was present but did not give any information.   Patient kept saying she had other people to talk to and she did not want to speak with RD. RD suggested tube feeds since her appetite will likely not improve any time soon. Patient declined.   GOC ongoing. Patient is not interested in palliative care at this time.   Labs: reviewed Meds: ancef, insulin, solu-medrol, phosphorus,  senokot, NS  Wt: 24# (16%) wt loss x 2 months  PO: 0% I/O's: +4.1 L   NUTRITION - FOCUSED PHYSICAL EXAM:  Patient declined  Diet Order:   Diet Order             Diet Carb Modified Fluid consistency: Thin; Room service appropriate? Yes  Diet effective now                   EDUCATION NEEDS:   Not appropriate for education at this time  Skin:  Skin Assessment: Skin Integrity Issues: Skin Integrity Issues:: Other (Comment) Other: sacrum pressure injury (stage not indicated)  Last BM:  unknown  Height:   Ht Readings from Last 1 Encounters:  04/02/23 4\' 10"  (1.473 m)    Weight:   Wt Readings from Last 1 Encounters:  05/01/23 61.3 kg    Ideal Body Weight:     BMI:  Body mass index is 28.24 kg/m.  Estimated Nutritional Needs:   Kcal:  1500-1830  Protein:  70-90 g  Fluid:  > 1.8 L    Leodis Rains, RDN, LDN  Clinical Nutrition

## 2023-05-01 NOTE — Progress Notes (Signed)
Dear Doctor: [name] This patient has been identified as a candidate for PICC for the following reason (s): IV therapy over 48 hours, poor veins/poor circulatory system (CHF, COPD, emphysema, diabetes, steroid use, IV drug abuse, etc.), and restarts due to phlebitis and infiltration in 24 hours If you agree, please write an order for the indicated device. For any questions contact the Vascular Access Team at [phone] if no answer, please leave a message.  Thank you for supporting the early vascular access assessment program.

## 2023-05-01 NOTE — Progress Notes (Signed)
Secure chat with Dr [name] and Dr Renold Don re IV access.  Able to successfully place a PIV today, recommend consideration of midline vs PICC if IV  ABT are to be continued long term.  Midline not needed at this time, will cancel midline order per secure chat.

## 2023-05-01 NOTE — Progress Notes (Signed)
PROGRESS NOTE  Madison Holden  ZOX:[id number] DOB: [birth year] DOA: 04/26/2023 PCP: Henrine Screws, MD   Brief Narrative: Patient is a 77 female with history of ILD/pulmonary fibrosis, rheumatoid arthritis on Enbrel and methotrexate, pulmonary hypertension, heart failure who presented with worsening shortness of breath.  COVID, to be positive.  She was hypoxic to 76% on home nasal cannula.  On presentation,she  required BiPAP, chest x-ray showed chronic ILD with a scattered airspace opacities.  Lab work showed elevated lactic acid of 4.1, BNP of 839, WBC of 21.5.  She was hypotensive, required low-dose Levophed.  Admitted under ICU service.  Hospital course remarkable for persistent requirement of high flow oxygen.  Finding of polymicrobial pneumonia with MSSA, Pseudomonas.  Blood cultures showed Staphylococcus, Pseudomonas.  Patient transferred to [facility] service on 7/9.  PCCM/ID following.  Palliative care consulted for goals of care.  Continue to try to wean off oxygen  Assessment & Plan:  Principal Problem:   Polymicrobial bacterial infection Active Problems:   Respiratory failure (HCC)   COVID-19 virus infection   PAH (pulmonary artery hypertension) (HCC)   Septic shock (HCC)  Severe sepsis with septic shock secondary to pneumonia/MSSA/Pseudomonas bacteremia: Hypotensive, hypoxic on presentation with elevated lactate.  Currently hemodynamically stable. On Cefazolin, oral ciprofloxacin.  Plan for continuing ciprofloxacin through 7/15, plan for 6 weeks of cefazolin. ID following.  Sepsis physiology is  improving.  Currently afebrile.  Leukocytosis improving  Acute on chronic hypoxic respiratory failure/ILD/pulmonary fibrosis: On 3 L of oxygen at home.  Had to be put on BiPAP on presentation.  On home oxygen.  Currently on high flow oxygen.  PCCM following.  This is likely secondary to superimposed pneumonia, concurrent COVID infection.  Continue to try to wean the oxygen.  Continue current  bronchodilators.  Also on high-dose steroid  Chronic diastolic CHF, pulmonary hypertension: Currently appears euvolemic.  Holding GDMT in the setting of shock.  AKI/hypokalemia/hypophosphatemia: Kidney function significantly improved and back to baseline.  Electrolytes being monitored and supplemented  Diabetes type 2: On Farxiga at home.  Currently on sliding's insulin  History of rheumatoid arthritis: On Enbrel, methotrexate at home  Anemia/thrombocytopenia: Likely in the setting of critical illness.  Platelets level improving  COVID infection: Continue supportive care  Goals of care: Severe respiratory failure requiring high flow oxygen at 30 L.  Palliative care consulted.  CODE STATUS DNR.  Plan is not to escalate the current treatment plan but continue supportive care, weaning of oxygen       Pressure Injury 04/26/23 Sacrum Left (Active)  04/26/23 0500  Location: Sacrum  Location Orientation: Left  Staging:   Wound Description (Comments):   Present on Admission:   Dressing Type Foam - Lift dressing to assess site every shift 04/30/23 1945    DVT prophylaxis:enoxaparin (LOVENOX) injection 40 mg Start: 04/27/23 1200 SCDs Start: 04/26/23 0338     Code Status: DNR  Family Communication: Discussed with husband at bedside on 7/10  Patient status:Inpatient  Patient is from :home  Anticipated discharge to:not sure  Estimated DC date:not sure   Consultants: PCCM,ID,palliative care  Procedures:None  Antimicrobials:  Anti-infectives (From admission, onward)    Start     Dose/Rate Route Frequency Ordered Stop   04/29/23 2200  ceFAZolin (ANCEF) IVPB 2g/100 mL premix        2 g 200 mL/hr over 30 Minutes Intravenous Every 8 hours 04/29/23 1404     04/29/23 2115  ciprofloxacin (CIPRO) IVPB 400 mg  400 mg 200 mL/hr over 60 Minutes Intravenous  Once 04/29/23 2017 04/30/23 0130   04/29/23 2000  ciprofloxacin (CIPRO) tablet 750 mg        750 mg Oral 2 times daily  04/29/23 1404 05/07/23 0759   04/26/23 2200  cefTRIAXone (ROCEPHIN) 1 g in sodium chloride 0.9 % 100 mL IVPB  Status:  Discontinued        1 g 200 mL/hr over 30 Minutes Intravenous Every 24 hours 04/26/23 0348 04/26/23 1107   04/26/23 1215  ceFEPIme (MAXIPIME) 2 g in sodium chloride 0.9 % 100 mL IVPB  Status:  Discontinued        2 g 200 mL/hr over 30 Minutes Intravenous Every 12 hours 04/26/23 1118 04/29/23 1404   04/26/23 0500  azithromycin (ZITHROMAX) 500 mg in sodium chloride 0.9 % 250 mL IVPB  Status:  Discontinued        500 mg 250 mL/hr over 60 Minutes Intravenous Daily 04/26/23 0348 04/26/23 1107   04/26/23 0130  cefTRIAXone (ROCEPHIN) 2 g in sodium chloride 0.9 % 100 mL IVPB        2 g 200 mL/hr over 30 Minutes Intravenous  Once 04/26/23 0118 04/26/23 0215       Subjective: Patient seen and examined at the bedside today.  Hemodynamically stable.  On 30 L of high flow oxygen.  Denies any worsening shortness of breath or cough.  She speaks in full sentences.  She is alert and oriented today.  Complains of weakness  Objective: Vitals:   04/30/23 2340 05/01/23 0408 05/01/23 0500 05/01/23 0606  BP: (!) 107/58 (!) 97/58    Pulse: (!) 102 96    Resp: 20 19    Temp: 97.7 F (36.5 C) 98 F (36.7 C)    TempSrc: Oral Oral    SpO2: 100% 100%    Weight:   59.5 kg 61.3 kg    Intake/Output Summary (Last 24 hours) at 05/01/2023 0904 Last data filed at 05/01/2023 0641 Gross per 24 hour  Intake 281.51 ml  Output 400 ml  Net -118.49 ml   Filed Weights   04/30/23 0500 05/01/23 0500 05/01/23 0606  Weight: 60.6 kg 59.5 kg 61.3 kg    Examination:  General exam: Overall comfortable, not in distress,weak and deconditioned HEENT: PERRL Respiratory system: Bilateral coarse crackles Cardiovascular system: S1 & S2 heard, RRR.  Gastrointestinal system: Abdomen is nondistended, soft and nontender. Central nervous system: Alert and oriented Extremities: No edema, no clubbing ,no  cyanosis Skin: No rashes, no ulcers,no icterus     Data Reviewed: I have personally reviewed following labs and imaging studies  CBC: Recent Labs  Lab 04/27/23 0757 04/27/23 0910 04/28/23 0021 04/29/23 0224 04/30/23 0117 05/01/23 0133  WBC 32.1*  --  21.6* 18.5* 13.1* 13.1*  NEUTROABS  --   --  17.8*  --   --  11.0*  HGB 10.4* 10.9* 9.9* 9.0* 8.7* 9.2*  HCT 32.3* 32.0* 30.1* 29.5* 28.3* 30.6*  MCV 105.6*  --  102.7* 105.7* 105.6* 108.1*  PLT 56*  --  70* 61* 77* 107*   Basic Metabolic Panel: Recent Labs  Lab 04/26/23 0434 04/26/23 0516 04/27/23 0757 04/27/23 0910 04/28/23 1000 04/29/23 0224 04/30/23 0117 05/01/23 0133 05/01/23 0134  NA 140   < > 141   < > 141 138 137 137 135  K 3.6   < > 3.4*   < > 3.6 3.7 3.8 3.8 3.8  CL 106  --  106  --  105 105 104 99 99  CO2 23  --  23  --  23 22 22 24 22   GLUCOSE 206*  --  157*  --  129* 129* 141* 110* 109*  BUN 39*  --  27*  --  19 20 22  25* 25*  CREATININE 1.39*  --  0.57  --  0.61 0.69 0.68 0.82 0.77  CALCIUM 8.3*  --  8.6*  --  8.5* 8.6* 8.7* 9.0 8.9  MG 1.4*  --  1.9  --  2.0  --  1.9 1.8  --   PHOS  --   --  1.5*  --  2.3*  --  2.1*  --  2.6   < > = values in this interval not displayed.     Recent Results (from the past 240 hour(s))  Culture, blood (Routine X 2) w Reflex to ID Panel     Status: Abnormal   Collection Time: 04/26/23  1:20 AM   Specimen: BLOOD  Result Value Ref Range Status   Specimen Description BLOOD SITE NOT SPECIFIED  Final   Special Requests   Final    BOTTLES DRAWN AEROBIC AND ANAEROBIC Blood Culture adequate volume   Culture  Setup Time   Final    GRAM POSITIVE COCCI GRAM NEGATIVE RODS IN BOTH AEROBIC AND ANAEROBIC BOTTLES CRITICAL RESULT CALLED TO, READ BACK BY AND VERIFIED WITH: PHARMD CAREN AMEND ON 04/26/23 @ 1737 BY DRT Performed at [facility], [address], [place], [place] [address]    Culture PSEUDOMONAS AERUGINOSA STAPHYLOCOCCUS AUREUS  (A)  Final   Report Status  04/29/2023 FINAL  Final   Organism ID, Bacteria PSEUDOMONAS AERUGINOSA  Final   Organism ID, Bacteria STAPHYLOCOCCUS AUREUS  Final      Susceptibility   Pseudomonas aeruginosa - MIC*    CEFTAZIDIME 4 SENSITIVE Sensitive     CIPROFLOXACIN <=0.25 SENSITIVE Sensitive     GENTAMICIN <=1 SENSITIVE Sensitive     IMIPENEM 1 SENSITIVE Sensitive     PIP/TAZO 8 SENSITIVE Sensitive     CEFEPIME 2 SENSITIVE Sensitive     * PSEUDOMONAS AERUGINOSA   Staphylococcus aureus - MIC*    CIPROFLOXACIN <=0.5 SENSITIVE Sensitive     ERYTHROMYCIN <=0.25 SENSITIVE Sensitive     GENTAMICIN <=0.5 SENSITIVE Sensitive     OXACILLIN 0.5 SENSITIVE Sensitive     TETRACYCLINE <=1 SENSITIVE Sensitive     VANCOMYCIN 1 SENSITIVE Sensitive     TRIMETH/SULFA <=10 SENSITIVE Sensitive     CLINDAMYCIN <=0.25 SENSITIVE Sensitive     RIFAMPIN <=0.5 SENSITIVE Sensitive     Inducible Clindamycin NEGATIVE Sensitive     LINEZOLID 2 SENSITIVE Sensitive     * STAPHYLOCOCCUS AUREUS  Blood Culture ID Panel (Reflexed)     Status: Abnormal   Collection Time: 04/26/23  1:20 AM  Result Value Ref Range Status   Enterococcus faecalis NOT DETECTED NOT DETECTED Final   Enterococcus Faecium NOT DETECTED NOT DETECTED Final   Listeria monocytogenes NOT DETECTED NOT DETECTED Final   Staphylococcus species DETECTED (A) NOT DETECTED Final    Comment: CRITICAL RESULT CALLED TO, READ BACK BY AND VERIFIED WITH: PHARMD CAREN AMEND ON 04/26/23 @ 1737 BY DRT    Staphylococcus aureus (BCID) DETECTED (A) NOT DETECTED Final    Comment: CRITICAL RESULT CALLED TO, READ BACK BY AND VERIFIED WITH: PHARMD CAREN AMEND ON 04/26/23 @ 1737 BY DRT    Staphylococcus epidermidis NOT DETECTED NOT DETECTED Final   Staphylococcus lugdunensis  NOT DETECTED NOT DETECTED Final   Streptococcus species NOT DETECTED NOT DETECTED Final   Streptococcus agalactiae NOT DETECTED NOT DETECTED Final   Streptococcus pneumoniae NOT DETECTED NOT DETECTED Final   Streptococcus  pyogenes NOT DETECTED NOT DETECTED Final   A.calcoaceticus-baumannii NOT DETECTED NOT DETECTED Final   Bacteroides fragilis NOT DETECTED NOT DETECTED Final   Enterobacterales NOT DETECTED NOT DETECTED Final   Enterobacter cloacae complex NOT DETECTED NOT DETECTED Final   Escherichia coli NOT DETECTED NOT DETECTED Final   Klebsiella aerogenes NOT DETECTED NOT DETECTED Final   Klebsiella oxytoca NOT DETECTED NOT DETECTED Final   Klebsiella pneumoniae NOT DETECTED NOT DETECTED Final   Proteus species NOT DETECTED NOT DETECTED Final   Salmonella species NOT DETECTED NOT DETECTED Final   Serratia marcescens NOT DETECTED NOT DETECTED Final   Haemophilus influenzae NOT DETECTED NOT DETECTED Final   Neisseria meningitidis NOT DETECTED NOT DETECTED Final   Pseudomonas aeruginosa DETECTED (A) NOT DETECTED Final    Comment: CRITICAL RESULT CALLED TO, READ BACK BY AND VERIFIED WITH: PHARMD CAREN AMEND ON 04/26/23 @ 1737 BY DRT    Stenotrophomonas maltophilia NOT DETECTED NOT DETECTED Final   Candida albicans NOT DETECTED NOT DETECTED Final   Candida auris NOT DETECTED NOT DETECTED Final   Candida glabrata NOT DETECTED NOT DETECTED Final   Candida krusei NOT DETECTED NOT DETECTED Final   Candida parapsilosis NOT DETECTED NOT DETECTED Final   Candida tropicalis NOT DETECTED NOT DETECTED Final   Cryptococcus neoformans/gattii NOT DETECTED NOT DETECTED Final   CTX-M ESBL NOT DETECTED NOT DETECTED Final   Carbapenem resistance IMP NOT DETECTED NOT DETECTED Final   Carbapenem resistance KPC NOT DETECTED NOT DETECTED Final   Meth resistant mecA/C and MREJ NOT DETECTED NOT DETECTED Final   Carbapenem resistance NDM NOT DETECTED NOT DETECTED Final   Carbapenem resistance VIM NOT DETECTED NOT DETECTED Final    Comment: Performed at [facility], [address] [place], [place] [address]  Culture, blood (Routine X 2) w Reflex to ID Panel     Status: Abnormal   Collection Time: 04/26/23  1:50 AM    Specimen: BLOOD LEFT ARM  Result Value Ref Range Status   Specimen Description BLOOD LEFT ARM  Final   Special Requests   Final    BOTTLES DRAWN AEROBIC AND ANAEROBIC Blood Culture adequate volume   Culture  Setup Time   Final    GRAM POSITIVE COCCI GRAM NEGATIVE RODS CRITICAL RESULT CALLED TO, READ BACK BY AND VERIFIED WITH: PHARMD CAREN AMEND ON 04/26/23 @ 1737 BY DRT    Culture (A)  Final    PSEUDOMONAS AERUGINOSA STAPHYLOCOCCUS AUREUS SUSCEPTIBILITIES PERFORMED ON PREVIOUS CULTURE WITHIN THE LAST 5 DAYS. Performed at [facility], [address] [place], [place] [address]    Report Status 04/29/2023 FINAL  Final  Respiratory (~20 pathogens) panel by PCR     Status: None   Collection Time: 04/26/23  4:11 AM   Specimen: Anterior Nasal Swab; Respiratory  Result Value Ref Range Status   Adenovirus NOT DETECTED NOT DETECTED Final   Coronavirus 229E NOT DETECTED NOT DETECTED Final    Comment: (NOTE) The Coronavirus on the Respiratory Panel, DOES NOT test for the novel  Coronavirus (2019 nCoV)    Coronavirus HKU1 NOT DETECTED NOT DETECTED Final   Coronavirus NL63 NOT DETECTED NOT DETECTED Final   Coronavirus OC43 NOT DETECTED NOT DETECTED Final   Metapneumovirus NOT DETECTED NOT DETECTED Final  Rhinovirus / Enterovirus NOT DETECTED NOT DETECTED Final   Influenza A NOT DETECTED NOT DETECTED Final   Influenza B NOT DETECTED NOT DETECTED Final   Parainfluenza Virus 1 NOT DETECTED NOT DETECTED Final   Parainfluenza Virus 2 NOT DETECTED NOT DETECTED Final   Parainfluenza Virus 3 NOT DETECTED NOT DETECTED Final   Parainfluenza Virus 4 NOT DETECTED NOT DETECTED Final   Respiratory Syncytial Virus NOT DETECTED NOT DETECTED Final   Bordetella pertussis NOT DETECTED NOT DETECTED Final   Bordetella Parapertussis NOT DETECTED NOT DETECTED Final   Chlamydophila pneumoniae NOT DETECTED NOT DETECTED Final   Mycoplasma pneumoniae NOT DETECTED NOT DETECTED Final    Comment: Performed at  [facility], [address] [place], [place] [address]  SARS Coronavirus 2 by RT PCR (hospital order, performed in [facility] [place] [facility] hospital lab) *cepheid single result test* Anterior Nasal Swab     Status: Abnormal   Collection Time: 04/26/23  4:11 AM   Specimen: Anterior Nasal Swab  Result Value Ref Range Status   SARS Coronavirus 2 by RT PCR POSITIVE (A) NEGATIVE Final    Comment: Performed at [facility], [address], [place], [place] [address]  MRSA Next Gen by PCR, Nasal     Status: None   Collection Time: 04/26/23  5:08 AM   Specimen: Nasal Mucosa; Nasal Swab  Result Value Ref Range Status   MRSA by PCR Next Gen NOT DETECTED NOT DETECTED Final    Comment: (NOTE) The GeneXpert MRSA Assay (FDA approved for NASAL specimens only), is one component of a comprehensive MRSA colonization surveillance program. It is not intended to diagnose MRSA infection nor to guide or monitor treatment for MRSA infections. Test performance is not FDA approved in patients less than 42 years old. Performed at [facility], [address], [place], [place] [address]   Culture, blood (Routine X 2) w Reflex to ID Panel     Status: None (Preliminary result)   Collection Time: 04/28/23  9:43 AM   Specimen: BLOOD LEFT HAND  Result Value Ref Range Status   Specimen Description BLOOD LEFT HAND  Final   Special Requests   Final    BOTTLES DRAWN AEROBIC ONLY Blood Culture adequate volume   Culture   Final    NO GROWTH 3 DAYS Performed at [facility], [address] [place], [place] [address]    Report Status PENDING  Incomplete  Culture, blood (Routine X 2) w Reflex to ID Panel     Status: None (Preliminary result)   Collection Time: 04/28/23  9:57 AM   Specimen: BLOOD LEFT HAND  Result Value Ref Range Status   Specimen Description BLOOD LEFT HAND  Final   Special Requests   Final    BOTTLES DRAWN AEROBIC ONLY Blood Culture adequate volume   Culture   Final    NO GROWTH 3  DAYS Performed at [facility], [address] [place], [place] [address]    Report Status PENDING  Incomplete     Radiology Studies: No results found.  Scheduled Meds:  arformoterol  15 mcg Nebulization BID   budesonide (PULMICORT) nebulizer solution  0.25 mg Nebulization BID   Chlorhexidine Gluconate Cloth  6 each Topical Q0600   ciprofloxacin  750 mg Oral BID   enoxaparin (LOVENOX) injection  40 mg Subcutaneous Q24H   fluticasone  1 spray Each Nare Daily   haloperidol lactate  1 mg Intravenous Once  insulin aspart  0-15 Units Subcutaneous TID WC   insulin aspart  0-5 Units Subcutaneous QHS   methylPREDNISolone (SOLU-MEDROL) injection  120 mg Intravenous Q24H   phosphorus  500 mg Oral BID   revefenacin  175 mcg Nebulization Daily   senna  1 tablet Oral QHS   Continuous Infusions:  sodium chloride Stopped (04/27/23 1426)    ceFAZolin (ANCEF) IV 2 g (05/01/23 0541)     LOS: 5 days   Burnadette Pop, MD [facility] P7/07/2023, 9:04 AM

## 2023-05-01 NOTE — Progress Notes (Signed)
Patient is oriented to self, place and time.  Earlier today she yelled, and stated,"Do not sit anything on my bedside table".  She was anxious about her IV but was unable to state why.  Provided reassurance.  Have encouraged the patient to eat and drink at appropriate intervals today.  Numerous times she has stated,"Do not bring that up, I do not want to talk about it"(eating).  The only liquid the patient has drank today was with morning medications.  I discussed the importance of the medications at length, before she would agree to take these.  The patient's husband said the pt really likes mashed potatoes with gravy on the side.  Ordered this to encourage patient to eat. The patient's husband ate the mashed potatoes after the patient refused.  She has not eaten any food today.  Ice chips are at the bedside and she hasn't put any ice into her mouth.  She is no longer verbally aggressive as she was earlier today.  Notified the provider of the patient's nutritional status, anxiety and that the patient has produced no urine output.  Bladder scan result was 24 ml.  Will continue to monitor and encourage PO intake. Patient's husband is still with her at bedside.

## 2023-05-02 DIAGNOSIS — D849 Immunodeficiency, unspecified: Secondary | ICD-10-CM

## 2023-05-02 DIAGNOSIS — U071 COVID-19: Secondary | ICD-10-CM | POA: Diagnosis not present

## 2023-05-02 DIAGNOSIS — R630 Anorexia: Secondary | ICD-10-CM | POA: Diagnosis not present

## 2023-05-02 DIAGNOSIS — J849 Interstitial pulmonary disease, unspecified: Secondary | ICD-10-CM | POA: Diagnosis not present

## 2023-05-02 DIAGNOSIS — A499 Bacterial infection, unspecified: Secondary | ICD-10-CM | POA: Diagnosis not present

## 2023-05-02 LAB — GLUCOSE, CAPILLARY
Glucose-Capillary: 148 mg/dL — ABNORMAL HIGH (ref 70–99)
Glucose-Capillary: 111 mg/dL — ABNORMAL HIGH (ref 70–99)
Glucose-Capillary: 116 mg/dL — ABNORMAL HIGH (ref 70–99)
Glucose-Capillary: 153 mg/dL — ABNORMAL HIGH (ref 70–99)

## 2023-05-02 MED ORDER — PREDNISONE 10 MG PO TABS: 10.0000 mg | ORAL_TABLET | Freq: Every day | ORAL | Status: DC

## 2023-05-02 MED ORDER — PANTOPRAZOLE SODIUM 40 MG PO TBEC
40.0000 mg | DELAYED_RELEASE_TABLET | Freq: Every day | ORAL | Status: DC
  Administered 2023-05-02 – 2023-05-03 (×2): 40 mg via ORAL
  Filled 2023-05-02 (×3): qty 1

## 2023-05-02 MED ORDER — PREDNISONE 20 MG PO TABS
20.0000 mg | ORAL_TABLET | Freq: Every day | ORAL | Status: DC
  Administered 2023-05-05: 20 mg via ORAL
  Filled 2023-05-02: qty 1

## 2023-05-02 MED ORDER — PREDNISONE 20 MG PO TABS
40.0000 mg | ORAL_TABLET | Freq: Every day | ORAL | Status: AC
  Administered 2023-05-02 – 2023-05-04 (×3): 40 mg via ORAL
  Filled 2023-05-02 (×3): qty 2

## 2023-05-02 MED ORDER — MIRTAZAPINE 15 MG PO TBDP
7.5000 mg | ORAL_TABLET | Freq: Every day | ORAL | Status: DC
  Administered 2023-05-02: 7.5 mg via ORAL
  Filled 2023-05-02: qty 0.5

## 2023-05-02 NOTE — Plan of Care (Signed)

## 2023-05-02 NOTE — Progress Notes (Signed)
PROGRESS NOTE  Madison Holden  ZOX:[id number] DOB: 30-Aug-1946 DOA: 04/26/2023 PCP: Henrine Screws, MD   Brief Narrative: Patient is a 77 female with history of ILD/pulmonary fibrosis, rheumatoid arthritis on Enbrel and methotrexate, pulmonary hypertension, heart failure who presented with worsening shortness of breath.  COVID, to be positive.  She was hypoxic to 76% on home nasal cannula.  On presentation,she  required BiPAP, chest x-ray showed chronic ILD with a scattered airspace opacities.  Lab work showed elevated lactic acid of 4.1, BNP of 839, WBC of 21.5.  She was hypotensive, required low-dose Levophed.  Admitted under ICU service.  Hospital course remarkable for persistent requirement of high flow oxygen.  Finding of polymicrobial pneumonia with MSSA, Pseudomonas.  Blood cultures showed Staphylococcus, Pseudomonas.  Patient transferred to [facility] service on 7/9.  PCCM/ID  were following.  Palliative care consulted for goals of care.  Continue to try to wean off oxygen,today at 15 L/min.Pending PT evaluation  Assessment & Plan:  Principal Problem:   Polymicrobial bacterial infection Active Problems:   Respiratory failure (HCC)   COVID-19 virus infection   PAH (pulmonary artery hypertension) (HCC)   Septic shock (HCC)  Severe sepsis with septic shock secondary to pneumonia/MSSA/Pseudomonas bacteremia: Hypotensive, hypoxic on presentation with elevated lactate.  Currently hemodynamically stable. On Cefazolin, oral ciprofloxacin.  Plan for continuing ciprofloxacin through 7/15, plan for 6 weeks of cefazolin. ID following.  Sepsis physiology is  improving.  Currently afebrile.  Leukocytosis improving  Acute on chronic hypoxic respiratory failure/ILD/pulmonary fibrosis: On 3 L of oxygen at home.  Had to be put on BiPAP on presentation.  On home oxygen.  Currently on high flow oxygen but tapered to 15 L/min.  PCCM were following.  This is likely secondary to superimposed pneumonia, concurrent  COVID infection.  Continue to try to wean the oxygen.  Continue current bronchodilators.  Started prednisone taper  Chronic diastolic CHF, pulmonary hypertension: Currently appears euvolemic.  Holding GDMT in the setting of shock.  AKI/hypokalemia/hypophosphatemia: Kidney function significantly improved and back to baseline.  Electrolytes being monitored and supplemented  Diabetes type 2: On Farxiga at home.  Currently on sliding's insulin  History of rheumatoid arthritis: On Enbrel, methotrexate at home  Anemia/thrombocytopenia: Likely in the setting of critical illness.  Platelets level improving  COVID infection: Continue supportive care, tapering steroids, bronchodilators as needed  Goals of care: Severe respiratory failure from pneumonia/COVID.  Palliative care consulted.  CODE STATUS DNR.  Plan is not to escalate the current treatment plan but continue supportive care, weaning of oxygen  Debility/deconditioning: PT/OT consulted.     Nutrition Problem: Inadequate oral intake Etiology: poor appetite Pressure Injury 04/26/23 Sacrum Left (Active)  04/26/23 0500  Location: Sacrum  Location Orientation: Left  Staging:   Wound Description (Comments):   Present on Admission:   Dressing Type Foam - Lift dressing to assess site every shift 05/01/23 1950    DVT prophylaxis:enoxaparin (LOVENOX) injection 40 mg Start: 04/27/23 1200 SCDs Start: 04/26/23 0338     Code Status: DNR  Family Communication: Discussed with husband at bedside on 7/11  Patient status:Inpatient  Patient is from :home  Anticipated discharge to:not sure,pending PT  Estimated DC date:not sure   Consultants: PCCM,ID,palliative care  Procedures:None  Antimicrobials:  Anti-infectives (From admission, onward)    Start     Dose/Rate Route Frequency Ordered Stop   05/01/23 2000  ciprofloxacin (CIPRO) IVPB 400 mg  Status:  Discontinued        400 mg 200 mL/hr  over 60 Minutes Intravenous Every 12 hours  05/01/23 1059 05/01/23 1622   05/01/23 2000  ciprofloxacin (CIPRO) tablet 750 mg        750 mg Oral 2 times daily 05/01/23 1622 05/07/23 1959   04/29/23 2200  ceFAZolin (ANCEF) IVPB 2g/100 mL premix        2 g 200 mL/hr over 30 Minutes Intravenous Every 8 hours 04/29/23 1404     04/29/23 2115  ciprofloxacin (CIPRO) IVPB 400 mg        400 mg 200 mL/hr over 60 Minutes Intravenous  Once 04/29/23 2017 04/30/23 0130   04/29/23 2000  ciprofloxacin (CIPRO) tablet 750 mg  Status:  Discontinued        750 mg Oral 2 times daily 04/29/23 1404 05/01/23 1059   04/26/23 2200  cefTRIAXone (ROCEPHIN) 1 g in sodium chloride 0.9 % 100 mL IVPB  Status:  Discontinued        1 g 200 mL/hr over 30 Minutes Intravenous Every 24 hours 04/26/23 0348 04/26/23 1107   04/26/23 1215  ceFEPIme (MAXIPIME) 2 g in sodium chloride 0.9 % 100 mL IVPB  Status:  Discontinued        2 g 200 mL/hr over 30 Minutes Intravenous Every 12 hours 04/26/23 1118 04/29/23 1404   04/26/23 0500  azithromycin (ZITHROMAX) 500 mg in sodium chloride 0.9 % 250 mL IVPB  Status:  Discontinued        500 mg 250 mL/hr over 60 Minutes Intravenous Daily 04/26/23 0348 04/26/23 1107   04/26/23 0130  cefTRIAXone (ROCEPHIN) 2 g in sodium chloride 0.9 % 100 mL IVPB        2 g 200 mL/hr over 30 Minutes Intravenous  Once 04/26/23 0118 04/26/23 0215       Subjective:  Patient seen and examined the bedside today.  She was down to 15 L of oxygen.  Husband at the bedside.  Not in respiratory distress.  She looks slightly confused today but she was able to tell what days today.  Looks sleepy this morning, was given trazodone at night.  Objective: Vitals:   05/02/23 0849 05/02/23 0924 05/02/23 0955 05/02/23 1136  BP: (!) 89/53  (!) 88/55 (!) 140/122  Pulse: 90  93 95  Resp: 13  20 18   Temp: 98.5 F (36.9 C)  98.4 F (36.9 C) 98.1 F (36.7 C)  TempSrc: Oral  Oral Oral  SpO2: 100% 95% 94% 96%  Weight:        Intake/Output Summary (Last 24 hours) at  05/02/2023 1145 Last data filed at 05/02/2023 0700 Gross per 24 hour  Intake 556.72 ml  Output 700 ml  Net -143.28 ml   Filed Weights   05/01/23 0500 05/01/23 0606 05/02/23 0500  Weight: 59.5 kg 61.3 kg 42.9 kg    Examination:  General exam: Overall comfortable, not in distress,weak and deconditioned, sleepy HEENT: PERRL Respiratory system: Bilateral coarse crackles Cardiovascular system: S1 & S2 heard, RRR.  Gastrointestinal system: Abdomen is nondistended, soft and nontender. Central nervous system: Sleepy, tells what day is today Extremities: No edema, no clubbing ,no cyanosis Skin: No rashes, no ulcers,no icterus       Data Reviewed: I have personally reviewed following labs and imaging studies  CBC: Recent Labs  Lab 04/27/23 0757 04/27/23 0910 04/28/23 0021 04/29/23 0224 04/30/23 0117 05/01/23 0133  WBC 32.1*  --  21.6* 18.5* 13.1* 13.1*  NEUTROABS  --   --  17.8*  --   --  11.0*  HGB 10.4* 10.9* 9.9* 9.0* 8.7* 9.2*  HCT 32.3* 32.0* 30.1* 29.5* 28.3* 30.6*  MCV 105.6*  --  102.7* 105.7* 105.6* 108.1*  PLT 56*  --  70* 61* 77* 107*   Basic Metabolic Panel: Recent Labs  Lab 04/26/23 0434 04/26/23 0516 04/27/23 0757 04/27/23 0910 04/28/23 1000 04/29/23 0224 04/30/23 0117 05/01/23 0133 05/01/23 0134  NA 140   < > 141   < > 141 138 137 137 135  K 3.6   < > 3.4*   < > 3.6 3.7 3.8 3.8 3.8  CL 106  --  106  --  105 105 104 99 99  CO2 23  --  23  --  23 22 22 24 22   GLUCOSE 206*  --  157*  --  129* 129* 141* 110* 109*  BUN 39*  --  27*  --  19 20 22  25* 25*  CREATININE 1.39*  --  0.57  --  0.61 0.69 0.68 0.82 0.77  CALCIUM 8.3*  --  8.6*  --  8.5* 8.6* 8.7* 9.0 8.9  MG 1.4*  --  1.9  --  2.0  --  1.9 1.8  --   PHOS  --   --  1.5*  --  2.3*  --  2.1*  --  2.6   < > = values in this interval not displayed.     Recent Results (from the past 240 hour(s))  Culture, blood (Routine X 2) w Reflex to ID Panel     Status: Abnormal   Collection Time: 04/26/23   1:20 AM   Specimen: BLOOD  Result Value Ref Range Status   Specimen Description BLOOD SITE NOT SPECIFIED  Final   Special Requests   Final    BOTTLES DRAWN AEROBIC AND ANAEROBIC Blood Culture adequate volume   Culture  Setup Time   Final    GRAM POSITIVE COCCI GRAM NEGATIVE RODS IN BOTH AEROBIC AND ANAEROBIC BOTTLES CRITICAL RESULT CALLED TO, READ BACK BY AND VERIFIED WITH: PHARMD CAREN AMEND ON 04/26/23 @ 1737 BY DRT Performed at [facility], [address] [place], [place] [address]    Culture PSEUDOMONAS AERUGINOSA STAPHYLOCOCCUS AUREUS  (A)  Final   Report Status 04/29/2023 FINAL  Final   Organism ID, Bacteria PSEUDOMONAS AERUGINOSA  Final   Organism ID, Bacteria STAPHYLOCOCCUS AUREUS  Final      Susceptibility   Pseudomonas aeruginosa - MIC*    CEFTAZIDIME 4 SENSITIVE Sensitive     CIPROFLOXACIN <=0.25 SENSITIVE Sensitive     GENTAMICIN <=1 SENSITIVE Sensitive     IMIPENEM 1 SENSITIVE Sensitive     PIP/TAZO 8 SENSITIVE Sensitive     CEFEPIME 2 SENSITIVE Sensitive     * PSEUDOMONAS AERUGINOSA   Staphylococcus aureus - MIC*    CIPROFLOXACIN <=0.5 SENSITIVE Sensitive     ERYTHROMYCIN <=0.25 SENSITIVE Sensitive     GENTAMICIN <=0.5 SENSITIVE Sensitive     OXACILLIN 0.5 SENSITIVE Sensitive     TETRACYCLINE <=1 SENSITIVE Sensitive     VANCOMYCIN 1 SENSITIVE Sensitive     TRIMETH/SULFA <=10 SENSITIVE Sensitive     CLINDAMYCIN <=0.25 SENSITIVE Sensitive     RIFAMPIN <=0.5 SENSITIVE Sensitive     Inducible Clindamycin NEGATIVE Sensitive     LINEZOLID 2 SENSITIVE Sensitive     * STAPHYLOCOCCUS AUREUS  Blood Culture ID Panel (Reflexed)     Status: Abnormal   Collection Time: 04/26/23  1:20 AM  Result Value Ref Range Status  Enterococcus faecalis NOT DETECTED NOT DETECTED Final   Enterococcus Faecium NOT DETECTED NOT DETECTED Final   Listeria monocytogenes NOT DETECTED NOT DETECTED Final   Staphylococcus species DETECTED (A) NOT DETECTED Final    Comment: CRITICAL  RESULT CALLED TO, READ BACK BY AND VERIFIED WITH: PHARMD CAREN AMEND ON 04/26/23 @ 1737 BY DRT    Staphylococcus aureus (BCID) DETECTED (A) NOT DETECTED Final    Comment: CRITICAL RESULT CALLED TO, READ BACK BY AND VERIFIED WITH: PHARMD CAREN AMEND ON 04/26/23 @ 1737 BY DRT    Staphylococcus epidermidis NOT DETECTED NOT DETECTED Final   Staphylococcus lugdunensis NOT DETECTED NOT DETECTED Final   Streptococcus species NOT DETECTED NOT DETECTED Final   Streptococcus agalactiae NOT DETECTED NOT DETECTED Final   Streptococcus pneumoniae NOT DETECTED NOT DETECTED Final   Streptococcus pyogenes NOT DETECTED NOT DETECTED Final   A.calcoaceticus-baumannii NOT DETECTED NOT DETECTED Final   Bacteroides fragilis NOT DETECTED NOT DETECTED Final   Enterobacterales NOT DETECTED NOT DETECTED Final   Enterobacter cloacae complex NOT DETECTED NOT DETECTED Final   Escherichia coli NOT DETECTED NOT DETECTED Final   Klebsiella aerogenes NOT DETECTED NOT DETECTED Final   Klebsiella oxytoca NOT DETECTED NOT DETECTED Final   Klebsiella pneumoniae NOT DETECTED NOT DETECTED Final   Proteus species NOT DETECTED NOT DETECTED Final   Salmonella species NOT DETECTED NOT DETECTED Final   Serratia marcescens NOT DETECTED NOT DETECTED Final   Haemophilus influenzae NOT DETECTED NOT DETECTED Final   Neisseria meningitidis NOT DETECTED NOT DETECTED Final   Pseudomonas aeruginosa DETECTED (A) NOT DETECTED Final    Comment: CRITICAL RESULT CALLED TO, READ BACK BY AND VERIFIED WITH: PHARMD CAREN AMEND ON 04/26/23 @ 1737 BY DRT    Stenotrophomonas maltophilia NOT DETECTED NOT DETECTED Final   Candida albicans NOT DETECTED NOT DETECTED Final   Candida auris NOT DETECTED NOT DETECTED Final   Candida glabrata NOT DETECTED NOT DETECTED Final   Candida krusei NOT DETECTED NOT DETECTED Final   Candida parapsilosis NOT DETECTED NOT DETECTED Final   Candida tropicalis NOT DETECTED NOT DETECTED Final   Cryptococcus  neoformans/gattii NOT DETECTED NOT DETECTED Final   CTX-M ESBL NOT DETECTED NOT DETECTED Final   Carbapenem resistance IMP NOT DETECTED NOT DETECTED Final   Carbapenem resistance KPC NOT DETECTED NOT DETECTED Final   Meth resistant mecA/C and MREJ NOT DETECTED NOT DETECTED Final   Carbapenem resistance NDM NOT DETECTED NOT DETECTED Final   Carbapenem resistance VIM NOT DETECTED NOT DETECTED Final    Comment: Performed at [facility], [address], [place], [place] [address]  Culture, blood (Routine X 2) w Reflex to ID Panel     Status: Abnormal   Collection Time: 04/26/23  1:50 AM   Specimen: BLOOD LEFT ARM  Result Value Ref Range Status   Specimen Description BLOOD LEFT ARM  Final   Special Requests   Final    BOTTLES DRAWN AEROBIC AND ANAEROBIC Blood Culture adequate volume   Culture  Setup Time   Final    GRAM POSITIVE COCCI GRAM NEGATIVE RODS CRITICAL RESULT CALLED TO, READ BACK BY AND VERIFIED WITH: PHARMD CAREN AMEND ON 04/26/23 @ 1737 BY DRT    Culture (A)  Final    PSEUDOMONAS AERUGINOSA STAPHYLOCOCCUS AUREUS SUSCEPTIBILITIES PERFORMED ON PREVIOUS CULTURE WITHIN THE LAST 5 DAYS. Performed at [facility], [address], [place], [place] [address]    Report Status 04/29/2023 FINAL  Final  Respiratory (~20 pathogens) panel  by PCR     Status: None   Collection Time: 04/26/23  4:11 AM   Specimen: Anterior Nasal Swab; Respiratory  Result Value Ref Range Status   Adenovirus NOT DETECTED NOT DETECTED Final   Coronavirus 229E NOT DETECTED NOT DETECTED Final    Comment: (NOTE) The Coronavirus on the Respiratory Panel, DOES NOT test for the novel  Coronavirus (2019 nCoV)    Coronavirus HKU1 NOT DETECTED NOT DETECTED Final   Coronavirus NL63 NOT DETECTED NOT DETECTED Final   Coronavirus OC43 NOT DETECTED NOT DETECTED Final   Metapneumovirus NOT DETECTED NOT DETECTED Final   Rhinovirus / Enterovirus NOT DETECTED NOT DETECTED Final   Influenza A NOT DETECTED NOT  DETECTED Final   Influenza B NOT DETECTED NOT DETECTED Final   Parainfluenza Virus 1 NOT DETECTED NOT DETECTED Final   Parainfluenza Virus 2 NOT DETECTED NOT DETECTED Final   Parainfluenza Virus 3 NOT DETECTED NOT DETECTED Final   Parainfluenza Virus 4 NOT DETECTED NOT DETECTED Final   Respiratory Syncytial Virus NOT DETECTED NOT DETECTED Final   Bordetella pertussis NOT DETECTED NOT DETECTED Final   Bordetella Parapertussis NOT DETECTED NOT DETECTED Final   Chlamydophila pneumoniae NOT DETECTED NOT DETECTED Final   Mycoplasma pneumoniae NOT DETECTED NOT DETECTED Final    Comment: Performed at [facility], [address], [place], [place] [address]  SARS Coronavirus 2 by RT PCR (hospital order, performed in [facility] hospital lab) *cepheid single result test* Anterior Nasal Swab     Status: Abnormal   Collection Time: 04/26/23  4:11 AM   Specimen: Anterior Nasal Swab  Result Value Ref Range Status   SARS Coronavirus 2 by RT PCR POSITIVE (A) NEGATIVE Final    Comment: Performed at [facility], [address], [place], [place] [address]  MRSA Next Gen by PCR, Nasal     Status: None   Collection Time: 04/26/23  5:08 AM   Specimen: Nasal Mucosa; Nasal Swab  Result Value Ref Range Status   MRSA by PCR Next Gen NOT DETECTED NOT DETECTED Final    Comment: (NOTE) The GeneXpert MRSA Assay (FDA approved for NASAL specimens only), is one component of a comprehensive MRSA colonization surveillance program. It is not intended to diagnose MRSA infection nor to guide or monitor treatment for MRSA infections. Test performance is not FDA approved in patients less than 29 years old. Performed at [facility], [address], [place], [place] [address]   Culture, blood (Routine X 2) w Reflex to ID Panel     Status: None (Preliminary result)   Collection Time: 04/28/23  9:43 AM   Specimen: BLOOD LEFT HAND  Result Value Ref Range Status   Specimen Description BLOOD LEFT HAND   Final   Special Requests   Final    BOTTLES DRAWN AEROBIC ONLY Blood Culture adequate volume   Culture   Final    NO GROWTH 4 DAYS Performed at [facility], [address] [place], [place] [address]    Report Status PENDING  Incomplete  Culture, blood (Routine X 2) w Reflex to ID Panel     Status: None (Preliminary result)   Collection Time: 04/28/23  9:57 AM   Specimen: BLOOD LEFT HAND  Result Value Ref Range Status   Specimen Description BLOOD LEFT HAND  Final   Special Requests   Final    BOTTLES DRAWN AEROBIC ONLY Blood Culture adequate volume   Culture   Final  NO GROWTH 4 DAYS Performed at [facility], [address], [place], [place] [address]    Report Status PENDING  Incomplete     Radiology Studies: No results found.  Scheduled Meds:  arformoterol  15 mcg Nebulization BID   budesonide (PULMICORT) nebulizer solution  0.25 mg Nebulization BID   Chlorhexidine Gluconate Cloth  6 each Topical Q0600   ciprofloxacin  750 mg Oral BID   enoxaparin (LOVENOX) injection  40 mg Subcutaneous Q24H   fluticasone  1 spray Each Nare Daily   haloperidol lactate  1 mg Intravenous Once   insulin aspart  0-15 Units Subcutaneous TID WC   insulin aspart  0-5 Units Subcutaneous QHS   pantoprazole  40 mg Oral Daily   [START ON 05/08/2023] predniSONE  10 mg Oral Q breakfast   [START ON 05/05/2023] predniSONE  20 mg Oral Q breakfast   predniSONE  40 mg Oral Q breakfast   revefenacin  175 mcg Nebulization Daily   senna  1 tablet Oral QHS   traZODone  50 mg Oral QHS   Continuous Infusions:  sodium chloride Stopped (04/27/23 1426)    ceFAZolin (ANCEF) IV 2 g (05/02/23 0525)     LOS: 6 days   Burnadette Pop, MD [facility] P7/08/2023, 11:45 AM

## 2023-05-02 NOTE — Progress Notes (Signed)
PT Cancellation Note  Patient Details Name: Madison Holden MRN: [id number] DOB: 03-Nov-1945   Cancelled Treatment:    Reason Eval/Treat Not Completed: Patient not medically ready PT orders received, chart reviewed. Nurse defers PT evaluation at this time noting even the slightest movement causes pt's SpO2 to drop & pt with low BP. Will f/u as able & as pt is medically appropriate.  Aleda Grana, PT, DPT 05/02/23, 9:58 AM   Sandi Mariscal 05/02/2023, 9:57 AM

## 2023-05-02 NOTE — Progress Notes (Signed)
Daily Progress Note   Patient Name: Madison Holden       Date: 05/02/2023 DOB: 12-13-1945  Age: 77 y.o. MRN#: [id number] Attending Physician: Burnadette Pop, MD Primary Care Physician: Henrine Screws, MD Admit Date: 04/26/2023  Reason for Consultation/Follow-up: Non pain symptom management  Patient Profile/HPI:  77 y.o. female  with past medical history of ILD, pulmonary fibrosis, rheumatoid arthritis on Enbrel and methotrexate, pulmonary HTN, R heart failure,  admitted on 04/26/2023 with septic shock related to COVID and multi species bacteremia. Initially required levophed but off now. Currently on HFNC 40 L/min 70% FiO2. Palliative medicine consulted for symptom management.    Subjective: Chart reviewed including labs, progress notes, imaging from this and previous encounters.  Met with patient and spouse at bedside.  Patient reports having much better night of sleep and feeling better today.  We had extended discussion about her appetite and option for feeding tube or not, and the risk of continued decline towards end of life.  She appeared to understand her choices and the consequences of each choice.  She noted that she needs to think more and I encouraged her to make decision by tomorrow.  I spoke with Gerlene Burdock outside the room. He understands the choices that are present. We discussed that feeding tube may prolong her life, but will not change her overall trajectory, lung illness or quality of life. [name] expressed concerns that trazodone made her more confused this morning. We discussed changing to mirtazapine and he is in agreement with this.     Physical Exam Vitals and nursing note reviewed.  Cardiovascular:     Rate and Rhythm: Normal rate.  Pulmonary:     Effort: Pulmonary  effort is normal.  Skin:    Comments: Diffuse bruising on arms  Neurological:     Mental Status: She is alert and oriented to person, place, and time.  Psychiatric:     Comments: Some confusion, tangential speech at times but able to be redirected             Vital Signs: BP (!) 140/122 (BP Location: Left Arm)   Pulse 95   Temp 98.1 F (36.7 C) (Oral)   Resp 18   Wt 42.9 kg   SpO2 96%   BMI 19.77 kg/m  SpO2: SpO2: 96 % O2  Device: O2 Device: High Flow Nasal Cannula O2 Flow Rate: O2 Flow Rate (L/min): 15 L/min  Intake/output summary:  Intake/Output Summary (Last 24 hours) at 05/02/2023 1219 Last data filed at 05/02/2023 0700 Gross per 24 hour  Intake 556.72 ml  Output 700 ml  Net -143.28 ml   LBM: Last BM Date :  (UTA) Baseline Weight: Weight: 57.2 kg Most recent weight: Weight: 42.9 kg       Palliative Assessment/Data: PPS: 40%      Patient Active Problem List   Diagnosis Date Noted   Polymicrobial bacterial infection 04/27/2023   Respiratory failure (HCC) 04/26/2023   COVID-19 virus infection 04/26/2023   PAH (pulmonary artery hypertension) (HCC) 04/26/2023   Septic shock (HCC) 04/26/2023   Chronic respiratory failure with hypoxia (HCC) 11/13/2022   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   Abnormal CT scan 11/13/2022   Acute on chronic respiratory failure with hypoxemia (HCC) 11/02/2022   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   ILD (interstitial lung disease) (HCC)    Immunosuppression due to drug therapy (HCC)    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   Endometrial cancer (HCC) 07/22/2018   Acute renal failure (ARF) (HCC) 07/01/2018   Endometrial carcinoma (HCC) 07/01/2018   Gout 07/01/2018   Hyperkalemia 07/01/2018   Morbid obesity (HCC) 06/26/2018   Chest pain 06/26/2018   Pre-operative cardiovascular examination 06/26/2018   Diabetes mellitus (HCC) 06/18/2018   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   Atherosclerosis of aorta (HCC) 05/02/2009   Mixed dyslipidemia 04/22/2009    Essential hypertension 04/15/2008   VENTRICULAR HYPERTROPHY, LEFT 04/15/2008   ESOPHAGEAL STRICTURE 04/15/2008   GERD 04/15/2008    Palliative Care Assessment & Plan    Assessment/Recommendations/Plan  Respiratory failure in the setting of ILD with + COVID and bacteremia- oxygen requirements improving- continue morphine as ordered Poor po intake- likely related to COVID- liberalize diet to regular to expand options, she doesn't want to be forced, recommend leaving water and ice chips at bedside- per Gerlene Burdock she drank 3 cups of water last night. She reports some physical difficulty swallowing and has some wet breathing sounds today- will request SLP eval. Symptom cluster- depressed mood, poor sleep, poor po intake- will stop trazodone per request of spouse and trial mirtazapine 7.5mg  po tonight PMT will followup re: feeding tube     Code Status: DNR  Prognosis:  Unable to determine  Discharge Planning: To Be Determined  Care plan was discussed with patient, her family and care team.  Thank you for allowing the Palliative Medicine Team to assist in the care of this patient.  Total time:  60 minutes  Greater than 50%  of this time was spent counseling and coordinating care related to the above assessment and plan.  Ocie Bob, AGNP-C Palliative Medicine   Please contact Palliative Medicine Team phone at [phone] for questions and concerns.

## 2023-05-03 ENCOUNTER — Encounter (HOSPITAL_COMMUNITY): Payer: Self-pay

## 2023-05-03 DIAGNOSIS — B965 Pseudomonas (aeruginosa) (mallei) (pseudomallei) as the cause of diseases classified elsewhere: Secondary | ICD-10-CM | POA: Diagnosis not present

## 2023-05-03 DIAGNOSIS — J1282 Pneumonia due to coronavirus disease 2019: Secondary | ICD-10-CM | POA: Diagnosis not present

## 2023-05-03 DIAGNOSIS — U071 COVID-19: Secondary | ICD-10-CM | POA: Diagnosis not present

## 2023-05-03 DIAGNOSIS — B9561 Methicillin susceptible Staphylococcus aureus infection as the cause of diseases classified elsewhere: Secondary | ICD-10-CM | POA: Diagnosis not present

## 2023-05-03 DIAGNOSIS — Z7189 Other specified counseling: Secondary | ICD-10-CM | POA: Diagnosis not present

## 2023-05-03 DIAGNOSIS — Z515 Encounter for palliative care: Secondary | ICD-10-CM | POA: Diagnosis not present

## 2023-05-03 DIAGNOSIS — A499 Bacterial infection, unspecified: Secondary | ICD-10-CM | POA: Diagnosis not present

## 2023-05-03 DIAGNOSIS — M542 Cervicalgia: Secondary | ICD-10-CM

## 2023-05-03 LAB — CBC
HCT: 25 % — ABNORMAL LOW (ref 36.0–46.0)
Hemoglobin: 7.6 g/dL — ABNORMAL LOW (ref 12.0–15.0)
MCH: 31.8 pg (ref 26.0–34.0)
MCHC: 30.4 g/dL (ref 30.0–36.0)
MCV: 104.6 fL — ABNORMAL HIGH (ref 80.0–100.0)
Platelets: 99 10*3/uL — ABNORMAL LOW (ref 150–400)
RBC: 2.39 MIL/uL — ABNORMAL LOW (ref 3.87–5.11)
RDW: 18.2 % — ABNORMAL HIGH (ref 11.5–15.5)
WBC: 8.1 10*3/uL (ref 4.0–10.5)
nRBC: 0.2 % (ref 0.0–0.2)

## 2023-05-03 LAB — CULTURE, BLOOD (ROUTINE X 2)
Culture: NO GROWTH
Culture: NO GROWTH
Special Requests: ADEQUATE
Special Requests: ADEQUATE

## 2023-05-03 LAB — BASIC METABOLIC PANEL
Anion gap: 11 (ref 5–15)
BUN: 29 mg/dL — ABNORMAL HIGH (ref 8–23)
CO2: 23 mmol/L (ref 22–32)
Calcium: 7.7 mg/dL — ABNORMAL LOW (ref 8.9–10.3)
Chloride: 100 mmol/L (ref 98–111)
Creatinine, Ser: 0.9 mg/dL (ref 0.44–1.00)
GFR, Estimated: >60 mL/min (ref >60)
Glucose, Bld: 137 mg/dL — ABNORMAL HIGH (ref 70–99)
Potassium: 3.4 mmol/L — ABNORMAL LOW (ref 3.5–5.1)
Sodium: 134 mmol/L — ABNORMAL LOW (ref 135–145)

## 2023-05-03 LAB — GLUCOSE, CAPILLARY
Glucose-Capillary: 118 mg/dL — ABNORMAL HIGH (ref 70–99)
Glucose-Capillary: 132 mg/dL — ABNORMAL HIGH (ref 70–99)
Glucose-Capillary: 118 mg/dL — ABNORMAL HIGH (ref 70–99)
Glucose-Capillary: 117 mg/dL — ABNORMAL HIGH (ref 70–99)

## 2023-05-03 MED ORDER — ADULT MULTIVITAMIN W/MINERALS CH
1.0000 | ORAL_TABLET | Freq: Every day | ORAL | Status: DC
  Administered 2023-05-03: 1 via ORAL
  Filled 2023-05-03 (×2): qty 1

## 2023-05-03 MED ORDER — POTASSIUM CHLORIDE CRYS ER 20 MEQ PO TBCR
40.0000 meq | EXTENDED_RELEASE_TABLET | Freq: Once | ORAL | Status: AC
  Administered 2023-05-03: 40 meq via ORAL
  Filled 2023-05-03: qty 2

## 2023-05-03 MED ORDER — MIDODRINE HCL 5 MG PO TABS
5.0000 mg | ORAL_TABLET | Freq: Three times a day (TID) | ORAL | Status: DC
  Administered 2023-05-03 – 2023-05-05 (×3): 5 mg via ORAL
  Filled 2023-05-03 (×4): qty 1

## 2023-05-03 MED ORDER — DICLOFENAC SODIUM 1 % EX GEL
4.0000 g | Freq: Four times a day (QID) | CUTANEOUS | Status: DC
  Administered 2023-05-03 – 2023-05-04 (×4): 4 g via TOPICAL
  Filled 2023-05-03: qty 100

## 2023-05-03 MED ORDER — MIRTAZAPINE 15 MG PO TBDP
15.0000 mg | ORAL_TABLET | Freq: Every day | ORAL | Status: DC
  Administered 2023-05-03 – 2023-05-04 (×2): 15 mg via ORAL
  Filled 2023-05-03 (×4): qty 1

## 2023-05-03 MED ORDER — ENSURE ENLIVE PO LIQD
237.0000 mL | Freq: Two times a day (BID) | ORAL | Status: DC
  Administered 2023-05-03: 237 mL via ORAL

## 2023-05-03 NOTE — Evaluation (Signed)
Clinical/Bedside Swallow Evaluation Patient Details  Name: Madison Holden MRN: [id number] Date of Birth: 04/26/1946  Today's Date: 05/03/2023 Time: SLP Start Time (ACUTE ONLY): 0957 SLP Stop Time (ACUTE ONLY): 1009 SLP Time Calculation (min) (ACUTE ONLY): 12 min  Past Medical History:  Past Medical History:  Diagnosis Date   Ambulates with cane    due to gout    Anemia    Arthritis    hands   Arthritis    Cancer, uterine (HCC)    Chest pain, unspecified 07-01-2018  per pt had 2 day chest pain and sob  6 weeks ago,, per pt no symptoms since   cardiology-- dr Consuello Bossier--  scheduled for stress test and echo 07-03-2018 prior to surgery scheduled 07-08-2018   Diarrhea, unspecified    intermittant   DOE (dyspnea on exertion)    Endometrial cancer (HCC)    Essential hypertension    Frequency of urination    GERD (gastroesophageal reflux disease)    Gout rheumologist-- michelle young PA ([facility])   07-01-2018 per pt flare up april 2019 , hands and wrists   Heart murmur    History of adenomatous polyp of colon    History of esophageal dilatation    for stricture   Mild nausea    per pt intermittant , no vomiting   Mixed dyslipidemia    Muscle weakness of extremity    bilateral upper and lower extremities due to gout   Poor appetite    per pt    Type 2 diabetes mellitus (HCC)    followed by pcp   Urgency of urination    Urine frequency    Past Surgical History:  Past Surgical History:  Procedure Laterality Date   BREAST CYST EXCISION Right 1974   benign per pt   BRONCHIAL WASHINGS  06/27/2021   Procedure: BRONCHIAL WASHINGS;  Surgeon: Madison Shan, MD;  Location: [facility];  Service: Endoscopy;;   CATARACT EXTRACTION W/ INTRAOCULAR LENS  IMPLANT, BILATERAL  2014;  2017   MOUTH SURGERY  11/2017   Upper quadrants,  per pt peridontist did laser procedure on gums   RIGHT HEART CATH N/A 02/14/2023   Procedure: RIGHT HEART CATH;  Surgeon: Orbie Pyo,  MD;  Location: [facility];  Service: Cardiovascular;  Laterality: N/A;   ROBOTIC ASSISTED TOTAL HYSTERECTOMY WITH BILATERAL SALPINGO OOPHERECTOMY Bilateral 07/22/2018   Procedure: XI ROBOTIC ASSISTED TOTAL HYSTERECTOMY WITH BILATERAL SALPINGO OOPHORECTOMY AND SENTINAL LYMPH NODE BIOPSY;  Surgeon: Adolphus Birchwood, MD;  Location: [facility];  Service: Gynecology;  Laterality: Bilateral;   TONSILLECTOMY  child   VIDEO BRONCHOSCOPY N/A 06/27/2021   Procedure: VIDEO BRONCHOSCOPY WITHOUT FLUORO;  Surgeon: Madison Shan, MD;  Location: [facility];  Service: Endoscopy;  Laterality: N/A;   HPI:  77yo female admitted 04/26/23 with hypoxemia and SOB, COVID+, found to have RLL PNA. PMH: GERD, esophageal dilation, DM2, Gout, ILD pulmonary fibrosis, RA, pulmonary HTN, right ventricular dysfunction.    Assessment / Plan / Recommendation  Clinical Impression  Bedside swallow assessment significantly limited as pt adamantly refused to consume anything but water, was unable to provide reason and voicing multiple complaints about her situation. Pt has history of esophageal dilation. Oromotor is intact and she states she has difficulty swallowing "that it goes down but then goes to the side". Asked if she experiences globus sensation and she was unable to provide specific answer and became distracted. There were no signs of aspiration with the 3 sips pt agreed to  consume. Unable to assess with solids. RN states she has trouble swallowing larger pills and she was willing to have those "lightly crushed." Recommend continue with regular texture, thin liquids, crush larger  pills and ST will continue efforts to observe with solids and discuss history of esophageal issues. SLP Visit Diagnosis: Dysphagia, unspecified (R13.10)    Aspiration Risk  Mild aspiration risk    Diet Recommendation Regular;Thin liquid    Liquid Administration via: Straw;Cup Medication Administration: Other (Comment) (crush large  pills) Supervision: Patient able to self feed Compensations: Slow rate;Small sips/bites Postural Changes: Seated upright at 90 degrees    Other  Recommendations Oral Care Recommendations: Oral care BID    Recommendations for follow up therapy are one component of a multi-disciplinary discharge planning process, led by the attending physician.  Recommendations may be updated based on patient status, additional functional criteria and insurance authorization.  Follow up Recommendations  (TBD)      Assistance Recommended at Discharge    Functional Status Assessment    Frequency and Duration min 2x/week  2 weeks       Prognosis        Swallow Study   General Date of Onset: 04/26/23 HPI: 77yo female admitted 04/26/23 with hypoxemia and SOB, COVID+, found to have RLL PNA. PMH: GERD, esophageal dilation, DM2, Gout, ILD pulmonary fibrosis, RA, pulmonary HTN, right ventricular dysfunction. Type of Study: Bedside Swallow Evaluation Previous Swallow Assessment: none found Diet Prior to this Study: Regular;Thin liquids (Level 0) Temperature Spikes Noted: No Respiratory Status: Other (comment) (HFNC 13 L) History of Recent Intubation: No Behavior/Cognition: Alert (irritable) Oral Cavity Assessment: Within Functional Limits Oral Care Completed by SLP: No Oral Cavity - Dentition: Adequate natural dentition (missing two pt says) Vision: Functional for self-feeding Self-Feeding Abilities: Able to feed self Patient Positioning: Upright in bed Baseline Vocal Quality: Normal    Oral/Motor/Sensory Function Overall Oral Motor/Sensory Function: Within functional limits   Ice Chips Ice chips: Not tested   Thin Liquid Thin Liquid: Within functional limits Presentation: Straw    Nectar Thick Nectar Thick Liquid: Not tested   Honey Thick Honey Thick Liquid: Not tested   Puree Puree: Not tested (pt refused)   Solid     Solid: Not tested (pt refused)      Madison Holden 05/03/2023,10:32 AM

## 2023-05-03 NOTE — Progress Notes (Signed)
PROGRESS NOTE  Madison Holden  ZOX:[id number] DOB: 11-Apr-1946 DOA: 04/26/2023 PCP: Henrine Screws, MD   Brief Narrative: Patient is a 77 female with history of ILD/pulmonary fibrosis, rheumatoid arthritis on Enbrel and methotrexate, pulmonary hypertension, heart failure who presented with worsening shortness of breath.  COVID, to be positive.  She was hypoxic to 76% on home nasal cannula.  On presentation,she  required BiPAP, chest x-ray showed chronic ILD with a scattered airspace opacities.  Lab work showed elevated lactic acid of 4.1, BNP of 839, WBC of 21.5.  She was hypotensive, required low-dose Levophed.  Admitted under ICU service.  Hospital course remarkable for persistent requirement of high flow oxygen.  Finding of polymicrobial pneumonia with MSSA, Pseudomonas.  Blood cultures showed Staphylococcus, Pseudomonas.  Patient transferred to [facility] service on 7/9.  PCCM/ID  were following.  Palliative care consulted for goals of care.  Continue to try to wean off oxygen,today at 13  L/min.Pending PT evaluation  Assessment & Plan:  Principal Problem:   Polymicrobial bacterial infection Active Problems:   Respiratory failure (HCC)   COVID-19 virus infection   PAH (pulmonary artery hypertension) (HCC)   Septic shock (HCC)  Severe sepsis with septic shock secondary to pneumonia/MSSA/Pseudomonas bacteremia: Hypotensive, hypoxic on presentation with elevated lactate.  Currently hemodynamically stable. On Cefazolin, oral ciprofloxacin.  Plan for continuing ciprofloxacin through 7/15, plan for 6 weeks of cefazolin. ID following.  Sepsis physiology has resolved.  Currently afebrile.  Leukocytosis resolved  Acute on chronic hypoxic respiratory failure/ILD/pulmonary fibrosis: On 3 L of oxygen at home.  Had to be put on BiPAP on presentation.  On home oxygen.  Currently on high flow oxygen but tapered to 15 L/min.  PCCM were following.  This is likely secondary to superimposed pneumonia, concurrent COVID  infection.  Continue to try to wean the oxygen.  Continue current bronchodilators.  On prednisone taper  Chronic diastolic CHF, pulmonary hypertension: Currently appears euvolemic.  Holding GDMT in the setting of shock.  AKI/hypokalemia/hypophosphatemia: Kidney function significantly improved and back to baseline.  Electrolytes being monitored and supplemented  Diabetes type 2: On Farxiga at home.  Currently on sliding scale  insulin  History of rheumatoid arthritis: On Enbrel, methotrexate at home.Has deformities on hands  Anemia/thrombocytopenia: Likely in the setting of critical illness.  Monitor.No evidence of acute blood loss  COVID infection: Continue supportive care, tapering steroids, bronchodilators as needed  Goals of care: Severe respiratory failure from pneumonia/COVID.  Palliative care consulted.  CODE STATUS DNR.  Plan is not to escalate the current treatment plan but continue supportive care, weaning of oxygen  Poor oral intake: Started on mirtazapine, dietitian consultation  Debility/deconditioning: PT/OT consulted.     Nutrition Problem: Inadequate oral intake Etiology: poor appetite Pressure Injury 04/26/23 Sacrum Left (Active)  04/26/23 0500  Location: Sacrum  Location Orientation: Left  Staging:   Wound Description (Comments):   Present on Admission:   Dressing Type Foam - Lift dressing to assess site every shift 05/02/23 2015    DVT prophylaxis:enoxaparin (LOVENOX) injection 40 mg Start: 04/27/23 1200 SCDs Start: 04/26/23 0338     Code Status: DNR  Family Communication: Discussed with husband at bedside on 7/11  Patient status:Inpatient  Patient is from :home  Anticipated discharge to:not sure,pending PT  Estimated DC date:not sure   Consultants: PCCM,ID,palliative care  Procedures:None  Antimicrobials:  Anti-infectives (From admission, onward)    Start     Dose/Rate Route Frequency Ordered Stop   05/01/23 2000  ciprofloxacin (CIPRO)  IVPB 400 mg  Status:  Discontinued        400 mg 200 mL/hr over 60 Minutes Intravenous Every 12 hours 05/01/23 1059 05/01/23 1622   05/01/23 2000  ciprofloxacin (CIPRO) tablet 750 mg        750 mg Oral 2 times daily 05/01/23 1622 05/07/23 1959   04/29/23 2200  ceFAZolin (ANCEF) IVPB 2g/100 mL premix        2 g 200 mL/hr over 30 Minutes Intravenous Every 8 hours 04/29/23 1404     04/29/23 2115  ciprofloxacin (CIPRO) IVPB 400 mg        400 mg 200 mL/hr over 60 Minutes Intravenous  Once 04/29/23 2017 04/30/23 0130   04/29/23 2000  ciprofloxacin (CIPRO) tablet 750 mg  Status:  Discontinued        750 mg Oral 2 times daily 04/29/23 1404 05/01/23 1059   04/26/23 2200  cefTRIAXone (ROCEPHIN) 1 g in sodium chloride 0.9 % 100 mL IVPB  Status:  Discontinued        1 g 200 mL/hr over 30 Minutes Intravenous Every 24 hours 04/26/23 0348 04/26/23 1107   04/26/23 1215  ceFEPIme (MAXIPIME) 2 g in sodium chloride 0.9 % 100 mL IVPB  Status:  Discontinued        2 g 200 mL/hr over 30 Minutes Intravenous Every 12 hours 04/26/23 1118 04/29/23 1404   04/26/23 0500  azithromycin (ZITHROMAX) 500 mg in sodium chloride 0.9 % 250 mL IVPB  Status:  Discontinued        500 mg 250 mL/hr over 60 Minutes Intravenous Daily 04/26/23 0348 04/26/23 1107   04/26/23 0130  cefTRIAXone (ROCEPHIN) 2 g in sodium chloride 0.9 % 100 mL IVPB        2 g 200 mL/hr over 30 Minutes Intravenous  Once 04/26/23 0118 04/26/23 0215       Subjective:  Patient seen and examined the bedside today.  She is more alert this morning.  She knows that today is Friday.  Knows current month in July.  Not in any current distress.  Oxygen weaned down to 13 L/min.  No new complaints.  Objective: Vitals:   05/03/23 0603 05/03/23 0700 05/03/23 0823 05/03/23 0855  BP: 101/66 (!) 86/44  (!) 103/59  Pulse: 93 60  91  Resp: 20 19  20   Temp: 98.1 F (36.7 C) 98 F (36.7 C)    TempSrc: Oral Oral    SpO2: 92% 96% 96% 90%  Weight:         Intake/Output Summary (Last 24 hours) at 05/03/2023 0956 Last data filed at 05/03/2023 1027 Gross per 24 hour  Intake 471.32 ml  Output 300 ml  Net 171.32 ml   Filed Weights   05/01/23 0606 05/02/23 0500 05/03/23 0500  Weight: 61.3 kg 42.9 kg 57.5 kg    Examination: General exam: Overall comfortable, not in distress, weak and deconditioned HEENT: PERRL Respiratory system: Bilateral crackles Cardiovascular system: S1 & S2 heard, RRR.  Gastrointestinal system: Abdomen is nondistended, soft and nontender. Central nervous system: Alert and oriented Extremities: No edema, no clubbing ,no cyanosis, deformities of hands due to arthritis Skin: No rashes, no ulcers,no icterus      Data Reviewed: I have personally reviewed following labs and imaging studies  CBC: Recent Labs  Lab 04/28/23 0021 04/29/23 0224 04/30/23 0117 05/01/23 0133 05/03/23 0756  WBC 21.6* 18.5* 13.1* 13.1* 8.1  NEUTROABS 17.8*  --   --  11.0*  --   HGB  9.9* 9.0* 8.7* 9.2* 7.6*  HCT 30.1* 29.5* 28.3* 30.6* 25.0*  MCV 102.7* 105.7* 105.6* 108.1* 104.6*  PLT 70* 61* 77* 107* 99*   Basic Metabolic Panel: Recent Labs  Lab 04/27/23 0757 04/27/23 0910 04/28/23 1000 04/29/23 0224 04/30/23 0117 05/01/23 0133 05/01/23 0134 05/03/23 0756  NA 141   < > 141 138 137 137 135 134*  K 3.4*   < > 3.6 3.7 3.8 3.8 3.8 3.4*  CL 106  --  105 105 104 99 99 100  CO2 23  --  23 22 22 24 22 23   GLUCOSE 157*  --  129* 129* 141* 110* 109* 137*  BUN 27*  --  19 20 22  25* 25* 29*  CREATININE 0.57  --  0.61 0.69 0.68 0.82 0.77 0.90  CALCIUM 8.6*  --  8.5* 8.6* 8.7* 9.0 8.9 7.7*  MG 1.9  --  2.0  --  1.9 1.8  --   --   PHOS 1.5*  --  2.3*  --  2.1*  --  2.6  --    < > = values in this interval not displayed.     Recent Results (from the past 240 hour(s))  Culture, blood (Routine X 2) w Reflex to ID Panel     Status: Abnormal   Collection Time: 04/26/23  1:20 AM   Specimen: BLOOD  Result Value Ref Range Status    Specimen Description BLOOD SITE NOT SPECIFIED  Final   Special Requests   Final    BOTTLES DRAWN AEROBIC AND ANAEROBIC Blood Culture adequate volume   Culture  Setup Time   Final    GRAM POSITIVE COCCI GRAM NEGATIVE RODS IN BOTH AEROBIC AND ANAEROBIC BOTTLES CRITICAL RESULT CALLED TO, READ BACK BY AND VERIFIED WITH: PHARMD CAREN AMEND ON 04/26/23 @ 1737 BY DRT Performed at [facility], [address], [place], [place] [address]    Culture PSEUDOMONAS AERUGINOSA STAPHYLOCOCCUS AUREUS  (A)  Final   Report Status 04/29/2023 FINAL  Final   Organism ID, Bacteria PSEUDOMONAS AERUGINOSA  Final   Organism ID, Bacteria STAPHYLOCOCCUS AUREUS  Final      Susceptibility   Pseudomonas aeruginosa - MIC*    CEFTAZIDIME 4 SENSITIVE Sensitive     CIPROFLOXACIN <=0.25 SENSITIVE Sensitive     GENTAMICIN <=1 SENSITIVE Sensitive     IMIPENEM 1 SENSITIVE Sensitive     PIP/TAZO 8 SENSITIVE Sensitive     CEFEPIME 2 SENSITIVE Sensitive     * PSEUDOMONAS AERUGINOSA   Staphylococcus aureus - MIC*    CIPROFLOXACIN <=0.5 SENSITIVE Sensitive     ERYTHROMYCIN <=0.25 SENSITIVE Sensitive     GENTAMICIN <=0.5 SENSITIVE Sensitive     OXACILLIN 0.5 SENSITIVE Sensitive     TETRACYCLINE <=1 SENSITIVE Sensitive     VANCOMYCIN 1 SENSITIVE Sensitive     TRIMETH/SULFA <=10 SENSITIVE Sensitive     CLINDAMYCIN <=0.25 SENSITIVE Sensitive     RIFAMPIN <=0.5 SENSITIVE Sensitive     Inducible Clindamycin NEGATIVE Sensitive     LINEZOLID 2 SENSITIVE Sensitive     * STAPHYLOCOCCUS AUREUS  Blood Culture ID Panel (Reflexed)     Status: Abnormal   Collection Time: 04/26/23  1:20 AM  Result Value Ref Range Status   Enterococcus faecalis NOT DETECTED NOT DETECTED Final   Enterococcus Faecium NOT DETECTED NOT DETECTED Final   Listeria monocytogenes NOT DETECTED NOT DETECTED Final   Staphylococcus species DETECTED (A) NOT DETECTED Final    Comment: CRITICAL RESULT  CALLED TO, READ BACK BY AND VERIFIED WITH: PHARMD CAREN  AMEND ON 04/26/23 @ 1737 BY DRT    Staphylococcus aureus (BCID) DETECTED (A) NOT DETECTED Final    Comment: CRITICAL RESULT CALLED TO, READ BACK BY AND VERIFIED WITH: PHARMD CAREN AMEND ON 04/26/23 @ 1737 BY DRT    Staphylococcus epidermidis NOT DETECTED NOT DETECTED Final   Staphylococcus lugdunensis NOT DETECTED NOT DETECTED Final   Streptococcus species NOT DETECTED NOT DETECTED Final   Streptococcus agalactiae NOT DETECTED NOT DETECTED Final   Streptococcus pneumoniae NOT DETECTED NOT DETECTED Final   Streptococcus pyogenes NOT DETECTED NOT DETECTED Final   A.calcoaceticus-baumannii NOT DETECTED NOT DETECTED Final   Bacteroides fragilis NOT DETECTED NOT DETECTED Final   Enterobacterales NOT DETECTED NOT DETECTED Final   Enterobacter cloacae complex NOT DETECTED NOT DETECTED Final   Escherichia coli NOT DETECTED NOT DETECTED Final   Klebsiella aerogenes NOT DETECTED NOT DETECTED Final   Klebsiella oxytoca NOT DETECTED NOT DETECTED Final   Klebsiella pneumoniae NOT DETECTED NOT DETECTED Final   Proteus species NOT DETECTED NOT DETECTED Final   Salmonella species NOT DETECTED NOT DETECTED Final   Serratia marcescens NOT DETECTED NOT DETECTED Final   Haemophilus influenzae NOT DETECTED NOT DETECTED Final   Neisseria meningitidis NOT DETECTED NOT DETECTED Final   Pseudomonas aeruginosa DETECTED (A) NOT DETECTED Final    Comment: CRITICAL RESULT CALLED TO, READ BACK BY AND VERIFIED WITH: PHARMD CAREN AMEND ON 04/26/23 @ 1737 BY DRT    Stenotrophomonas maltophilia NOT DETECTED NOT DETECTED Final   Candida albicans NOT DETECTED NOT DETECTED Final   Candida auris NOT DETECTED NOT DETECTED Final   Candida glabrata NOT DETECTED NOT DETECTED Final   Candida krusei NOT DETECTED NOT DETECTED Final   Candida parapsilosis NOT DETECTED NOT DETECTED Final   Candida tropicalis NOT DETECTED NOT DETECTED Final   Cryptococcus neoformans/gattii NOT DETECTED NOT DETECTED Final   CTX-M ESBL NOT DETECTED  NOT DETECTED Final   Carbapenem resistance IMP NOT DETECTED NOT DETECTED Final   Carbapenem resistance KPC NOT DETECTED NOT DETECTED Final   Meth resistant mecA/C and MREJ NOT DETECTED NOT DETECTED Final   Carbapenem resistance NDM NOT DETECTED NOT DETECTED Final   Carbapenem resistance VIM NOT DETECTED NOT DETECTED Final    Comment: Performed at [facility], [address], [place], [place] [address]  Culture, blood (Routine X 2) w Reflex to ID Panel     Status: Abnormal   Collection Time: 04/26/23  1:50 AM   Specimen: BLOOD LEFT ARM  Result Value Ref Range Status   Specimen Description BLOOD LEFT ARM  Final   Special Requests   Final    BOTTLES DRAWN AEROBIC AND ANAEROBIC Blood Culture adequate volume   Culture  Setup Time   Final    GRAM POSITIVE COCCI GRAM NEGATIVE RODS CRITICAL RESULT CALLED TO, READ BACK BY AND VERIFIED WITH: PHARMD CAREN AMEND ON 04/26/23 @ 1737 BY DRT    Culture (A)  Final    PSEUDOMONAS AERUGINOSA STAPHYLOCOCCUS AUREUS SUSCEPTIBILITIES PERFORMED ON PREVIOUS CULTURE WITHIN THE LAST 5 DAYS. Performed at [facility], [address], [place], [place] [address]    Report Status 04/29/2023 FINAL  Final  Respiratory (~20 pathogens) panel by PCR     Status: None   Collection Time: 04/26/23  4:11 AM   Specimen: Anterior Nasal Swab; Respiratory  Result Value Ref Range Status   Adenovirus NOT DETECTED NOT DETECTED Final   Coronavirus  229E NOT DETECTED NOT DETECTED Final    Comment: (NOTE) The Coronavirus on the Respiratory Panel, DOES NOT test for the novel  Coronavirus (2019 nCoV)    Coronavirus HKU1 NOT DETECTED NOT DETECTED Final   Coronavirus NL63 NOT DETECTED NOT DETECTED Final   Coronavirus OC43 NOT DETECTED NOT DETECTED Final   Metapneumovirus NOT DETECTED NOT DETECTED Final   Rhinovirus / Enterovirus NOT DETECTED NOT DETECTED Final   Influenza A NOT DETECTED NOT DETECTED Final   Influenza B NOT DETECTED NOT DETECTED Final   Parainfluenza  Virus 1 NOT DETECTED NOT DETECTED Final   Parainfluenza Virus 2 NOT DETECTED NOT DETECTED Final   Parainfluenza Virus 3 NOT DETECTED NOT DETECTED Final   Parainfluenza Virus 4 NOT DETECTED NOT DETECTED Final   Respiratory Syncytial Virus NOT DETECTED NOT DETECTED Final   Bordetella pertussis NOT DETECTED NOT DETECTED Final   Bordetella Parapertussis NOT DETECTED NOT DETECTED Final   Chlamydophila pneumoniae NOT DETECTED NOT DETECTED Final   Mycoplasma pneumoniae NOT DETECTED NOT DETECTED Final    Comment: Performed at [facility], [address] [place], [place] [address]  SARS Coronavirus 2 by RT PCR (hospital order, performed in [facility] hospital lab) *cepheid single result test* Anterior Nasal Swab     Status: Abnormal   Collection Time: 04/26/23  4:11 AM   Specimen: Anterior Nasal Swab  Result Value Ref Range Status   SARS Coronavirus 2 by RT PCR POSITIVE (A) NEGATIVE Final    Comment: Performed at [facility], [address] [place], [place] [address]  MRSA Next Gen by PCR, Nasal     Status: None   Collection Time: 04/26/23  5:08 AM   Specimen: Nasal Mucosa; Nasal Swab  Result Value Ref Range Status   MRSA by PCR Next Gen NOT DETECTED NOT DETECTED Final    Comment: (NOTE) The GeneXpert MRSA Assay (FDA approved for NASAL specimens only), is one component of a comprehensive MRSA colonization surveillance program. It is not intended to diagnose MRSA infection nor to guide or monitor treatment for MRSA infections. Test performance is not FDA approved in patients less than 74 years old. Performed at [facility], [address], [place], [place] [address]   Culture, blood (Routine X 2) w Reflex to ID Panel     Status: None   Collection Time: 04/28/23  9:43 AM   Specimen: BLOOD LEFT HAND  Result Value Ref Range Status   Specimen Description BLOOD LEFT HAND  Final   Special Requests   Final    BOTTLES DRAWN AEROBIC ONLY Blood Culture adequate volume    Culture   Final    NO GROWTH 5 DAYS Performed at [facility], [address] [place], [place] [address]    Report Status 05/03/2023 FINAL  Final  Culture, blood (Routine X 2) w Reflex to ID Panel     Status: None   Collection Time: 04/28/23  9:57 AM   Specimen: BLOOD LEFT HAND  Result Value Ref Range Status   Specimen Description BLOOD LEFT HAND  Final   Special Requests   Final    BOTTLES DRAWN AEROBIC ONLY Blood Culture adequate volume   Culture   Final    NO GROWTH 5 DAYS Performed at [facility], [address] [place], [place] [address]    Report Status 05/03/2023 FINAL  Final     Radiology Studies: No results found.  Scheduled Meds:  arformoterol  15 mcg Nebulization BID   budesonide (PULMICORT) nebulizer solution  0.25 mg Nebulization BID   Chlorhexidine Gluconate Cloth  6 each Topical Q0600   ciprofloxacin  750 mg Oral BID   enoxaparin (LOVENOX) injection  40 mg Subcutaneous Q24H   fluticasone  1 spray Each Nare Daily   haloperidol lactate  1 mg Intravenous Once   insulin aspart  0-15 Units Subcutaneous TID WC   insulin aspart  0-5 Units Subcutaneous QHS   midodrine  5 mg Oral TID WC   mirtazapine  7.5 mg Oral QHS   pantoprazole  40 mg Oral Daily   [START ON 05/08/2023] predniSONE  10 mg Oral Q breakfast   [START ON 05/05/2023] predniSONE  20 mg Oral Q breakfast   predniSONE  40 mg Oral Q breakfast   revefenacin  175 mcg Nebulization Daily   senna  1 tablet Oral QHS   Continuous Infusions:  sodium chloride Stopped (04/27/23 1426)    ceFAZolin (ANCEF) IV 2 g (05/03/23 0981)     LOS: 7 days   Burnadette Pop, MD [facility] P7/09/2023, 9:56 AM

## 2023-05-03 NOTE — Progress Notes (Signed)
Initial Nutrition Assessment  DOCUMENTATION CODES:   Severe malnutrition in context of acute illness/injury  INTERVENTION:  RD suggests small bore NG feeding tube placement and starting TF's due to patient's extreme poor po intake, if medically appropriate (GOC ongoing)  Encourage po intake    Trial Ensure Plus High Protein po BID, each supplement provides 350 kcal and 20 grams of protein.  MVI   NUTRITION DIAGNOSIS:   Severe Malnutrition related to acute illness as evidenced by percent weight loss, energy intake < or equal to 50% for > or equal to 5 days.  GOAL:   Patient will meet greater than or equal to 90% of their needs  MONITOR:   Labs, PO intake, I & O's, Weight trends, Skin, Supplement acceptance  REASON FOR ASSESSMENT:   Consult Assessment of nutrition requirement/status  ASSESSMENT:   77 y.o. female with PMHx including ILD pulmonary fibrosis, rheumatoid arthritis, pulmonary hypertension, right ventricular dysfunction, HF and more who presents with hypoxemia and shortness of breath. Patient found to have COVID pneumonia bacteremia  Per MD notes:   -GOC ongoing.  -After conversation with palliative NP, patient's diet was liberalized and she was reported to understand that she understands the consequences of her poor po intake - Patient to make a decision about POC today  -confusion has improved   Per SLP, patient is cleared to continue regular diet and thin liquids   Patient has refused most po intake but has been reported to be drinking more fluids. RD to trial ONS.   Labs: Na 134, K+ 3.4, Glu 137, BUN 29 Meds: ancef, haldol, insulin, remeron, protonix, KCl, deltasone, senokot, NS   Wt: 25# (16.5%) wt loss x 2 months  05/03/23 57.5 kg  04/18/23 56.6 kg  04/02/23 62.1 kg  03/11/23 66.7 kg  02/26/23 68.9 kg  02/21/23 68.7 kg   PO: 0% at all documented meals  I/O's: -308 ml  NUTRITION - FOCUSED PHYSICAL EXAM:  RD working remotely   Diet Order:    Diet Order             Diet regular Room service appropriate? Yes with Assist; Fluid consistency: Thin  Diet effective now                   EDUCATION NEEDS:   Not appropriate for education at this time  Skin:  Skin Assessment: Skin Integrity Issues: Skin Integrity Issues:: Other (Comment) Other: sacrum pressure injury (stage not indicated)  Last BM:  7/11 type 6  Height:   Ht Readings from Last 1 Encounters:  04/02/23 4\' 10"  (1.473 m)    Weight:   Wt Readings from Last 1 Encounters:  05/03/23 57.5 kg    Ideal Body Weight:     BMI:  Body mass index is 26.49 kg/m.  Estimated Nutritional Needs:   Kcal:  1500-1830  Protein:  70-90 g  Fluid:  > 1.8 L    Leodis Rains, RDN, LDN  Clinical Nutrition

## 2023-05-03 NOTE — Evaluation (Signed)
Physical Therapy Evaluation Patient Details Name: Madison Holden MRN: [id number] DOB: [birth year] Today's Date: 05/03/2023  History of Present Illness  Pt is 77 year old presented to [facility] on  04/26/23 for septic shock related to Covid. PMH - ILD, pulmonary fibrosis, rheumatoid arthritis, pulmonary htn, CHF, endometrial CA  Clinical Impression  Pt admitted with above diagnosis and presents to PT with functional limitations due to deficits listed below (See PT problem list). Pt needs skilled PT to maximize independence and safety. Pt with poor activity tolerance and poor mobility. Will need significant rehab if medical status allows. Patient will benefit from continued inpatient follow up therapy, <3 hours/day           Assistance Recommended at Discharge Frequent or constant Supervision/Assistance  If plan is discharge home, recommend the following:  Can travel by private vehicle  Two people to help with walking and/or transfers;Two people to help with bathing/dressing/bathroom;Direct supervision/assist for medications management;Direct supervision/assist for financial management;Assist for transportation   No    Equipment Recommendations Other (comment) (To be determined at next venue)  Recommendations for Other Services       Functional Status Assessment Patient has had a recent decline in their functional status and demonstrates the ability to make significant improvements in function in a reasonable and predictable amount of time.     Precautions / Restrictions Precautions Precautions: Fall;Other (comment) Precaution Comments: Covid Restrictions Weight Bearing Restrictions: No      Mobility  Bed Mobility Overal bed mobility: Needs Assistance Bed Mobility: Supine to Sit, Sit to Supine     Supine to sit: Max assist, HOB elevated Sit to supine: Max assist   General bed mobility comments: Assist to bring legs off of bed, elevate trunk into sitting and bring hips to EOB.  Assist to lower trunk and bring legs back into bed returning to supine.    Transfers                   General transfer comment: Did not attempt    Ambulation/Gait                  Stairs            Wheelchair Mobility     Tilt Bed    Modified Rankin (Stroke Patients Only)       Balance Overall balance assessment: Needs assistance Sitting-balance support: Bilateral upper extremity supported, Feet supported Sitting balance-Leahy Scale: Poor Sitting balance - Comments: Sat EOB x 10 minutes with UE support and min guard                                     Pertinent Vitals/Pain Pain Assessment Pain Assessment: 0-10 Pain Score: 8  Pain Location: neck Pain Descriptors / Indicators: Grimacing, Guarding Pain Intervention(s): Monitored during session, Limited activity within patient's tolerance, Repositioned    Home Living Family/patient expects to be discharged to:: Private residence Living Arrangements: Spouse/significant other Available Help at Discharge: Family;Available 24 hours/day Type of Home: House Home Access: Stairs to enter Entrance Stairs-Rails: Right Entrance Stairs-Number of Steps: 2   Home Layout: One level Home Equipment: Administrator (4 wheels)      Prior Function Prior Level of Function : Needs assist       Physical Assist : Mobility (physical);ADLs (physical) Mobility (physical): Bed mobility;Transfers;Gait   Mobility Comments: Until mid June amb with rollator modified independent. Since then  progressively weaker and requiring assist and very limited amb       Hand Dominance        Extremity/Trunk Assessment   Upper Extremity Assessment Upper Extremity Assessment: Generalized weakness    Lower Extremity Assessment Lower Extremity Assessment: Generalized weakness    Cervical / Trunk Assessment Cervical / Trunk Assessment: Kyphotic;Other exceptions (forward head)  Communication    Communication: No difficulties  Cognition Arousal/Alertness: Awake/alert Behavior During Therapy: WFL for tasks assessed/performed Overall Cognitive Status: Impaired/Different from baseline Area of Impairment: Problem solving, Memory, Following commands                     Memory: Decreased short-term memory Following Commands: Follows one step commands consistently     Problem Solving: Slow processing, Requires verbal cues          General Comments General comments (skin integrity, edema, etc.): Pt on 15L HHFNC. SpO2 93-96%    Exercises     Assessment/Plan    PT Assessment Patient needs continued PT services  PT Problem List Decreased strength;Decreased activity tolerance;Decreased balance;Decreased mobility;Cardiopulmonary status limiting activity       PT Treatment Interventions DME instruction;Gait training;Functional mobility training;Therapeutic activities;Therapeutic exercise;Balance training;Patient/family education    PT Goals (Current goals can be found in the Care Plan section)  Acute Rehab PT Goals Patient Stated Goal: not stated PT Goal Formulation: With patient/family Time For Goal Achievement: 05/17/23 Potential to Achieve Goals: Fair    Frequency Min 1X/week     Co-evaluation               AM-PAC PT "6 Clicks" Mobility  Outcome Measure Help needed turning from your back to your side while in a flat bed without using bedrails?: A Lot Help needed moving from lying on your back to sitting on the side of a flat bed without using bedrails?: A Lot Help needed moving to and from a bed to a chair (including a wheelchair)?: Total Help needed standing up from a chair using your arms (e.g., wheelchair or bedside chair)?: Total Help needed to walk in hospital room?: Total Help needed climbing 3-5 steps with a railing? : Total 6 Click Score: 8    End of Session Equipment Utilized During Treatment: Oxygen Activity Tolerance: Patient limited  by fatigue Patient left: in bed;with call bell/phone within reach;with bed alarm set;with family/visitor present Nurse Communication: Mobility status PT Visit Diagnosis: Other abnormalities of gait and mobility (R26.89);Muscle weakness (generalized) (M62.81)    Time: 9811-9147 PT Time Calculation (min) (ACUTE ONLY): 34 min   Charges:   PT Evaluation $PT Eval Moderate Complexity: 1 Mod PT Treatments $Therapeutic Activity: 8-22 mins PT General Charges $$ ACUTE PT VISIT: 1 Visit         [facility] PT Acute Rehabilitation Services Office [phone]   Angelina Ok [facility] 05/03/2023, 4:22 PM

## 2023-05-03 NOTE — Progress Notes (Signed)
[facility]  Date of Admission:  04/26/2023     CC: Covid pna/infection Polymicrobial bsi (mssa/pseudomonas)  Lines: Peripheral iv's  Abx: 7/8-c cipro 7/8-c cefazolin  7/5-7/8 cefepime 7/5 ceftriaxone/azith  ASSESSMENT: RA with interstitial lung disease, patient on methotrexate and enbrel admitted with septic shock found to have covid infection (cycle threshold 13) and mssa/pseudomonas bacteremia  7/5 pan-body ct interstitial opacities and right lower lobe pna  No focal pain in terms of joint/back involvement and no prosthesis on initial interview  7/7 repeat bcx ngtd  Tte no obvious sign of endocarditis; deferring tee due to chronically tenuous respiratory status in setting ild from RA  --------- 7/12 assessment Today reported to me chronic voltaren gel use for chronic neck pain which seems to be worse the last 24hours. 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; would finish cipro on 7/16 Mri cspine to be ordered in a few days once patient less anxious and more cooperative and pending result will see if any abx change needed Covid management per primary team Discussed with primary team  I spent more than 50 minute reviewing data/chart, and coordinating care, providing direct face to face time providing counseling/discussing diagnostics/treatment plan with patient and treatment team     Principal Problem:   Polymicrobial bacterial infection Active Problems:   Respiratory failure (HCC)   COVID-19 virus infection   PAH (pulmonary artery hypertension) (HCC)   Septic shock (HCC)   Allergies  Allergen Reactions   Lovastatin     myalgia    Scheduled Meds:  arformoterol  15 mcg  Nebulization BID   budesonide (PULMICORT) nebulizer solution  0.25 mg Nebulization BID   Chlorhexidine Gluconate Cloth  6 each Topical Q0600   ciprofloxacin  750 mg Oral BID   diclofenac Sodium  4 g Topical QID   enoxaparin (LOVENOX) injection  40 mg Subcutaneous Q24H   feeding supplement  237 mL Oral BID BM   fluticasone  1 spray Each Nare Daily   haloperidol lactate  1 mg Intravenous Once   insulin aspart  0-15 Units Subcutaneous TID WC   insulin aspart  0-5 Units Subcutaneous QHS   midodrine  5 mg Oral TID WC   mirtazapine  15 mg Oral QHS   multivitamin with minerals  1 tablet Oral Daily   pantoprazole  40 mg Oral Daily   [START ON 05/08/2023] predniSONE  10 mg Oral Q breakfast   [START ON 05/05/2023] predniSONE  20 mg Oral Q breakfast   predniSONE  40 mg Oral Q breakfast   revefenacin  175 mcg Nebulization Daily   senna  1 tablet Oral QHS   Continuous Infusions:  sodium chloride Stopped (04/27/23 1426)    ceFAZolin (ANCEF) IV 2 g (05/03/23 0611)   PRN Meds:.docusate sodium, morphine injection, morphine CONCENTRATE, polyethylene glycol   SUBJECTIVE: Neck pain No n/v/diarrhea No other complaint   Review of Systems: ROS All other ROS was negative, except mentioned above     OBJECTIVE: Vitals:   05/03/23 0700 05/03/23 0823 05/03/23 0855 05/03/23 1121  BP: (!) 86/44  (!) 103/59 (!) 90/51  Pulse: 60  91 84  Resp: 19  20 (!) 26  Temp: 98 F (36.7 C)  98 F (36.7 C)  TempSrc: Oral   Oral  SpO2: 96% 96% 90% 98%  Weight:       Body mass index is 26.49 kg/m.  Physical Exam General/constitutional: no distress, pleasant, conversant, sleepy HEENT: Normocephalic, PER, Conj Clear CV: rrr no mrg Lungs: clear to auscultation, normal respiratory effort Abd: Soft, Nontender Ext: no edema Skin: No Rash Neuro: nonfocal MSK: cspine tenderness        Lab Results Lab Results  Component Value Date   WBC 8.1 05/03/2023   HGB 7.6 (L) 05/03/2023   HCT 25.0 (L)  05/03/2023   MCV 104.6 (H) 05/03/2023   PLT 99 (L) 05/03/2023    Lab Results  Component Value Date   CREATININE 0.90 05/03/2023   BUN 29 (H) 05/03/2023   NA 134 (L) 05/03/2023   K 3.4 (L) 05/03/2023   CL 100 05/03/2023   CO2 23 05/03/2023    Lab Results  Component Value Date   ALT 12 04/28/2023   AST 25 04/28/2023   ALKPHOS 87 04/28/2023   BILITOT 2.0 (H) 04/28/2023      Microbiology: Recent Results (from the past 240 hour(s))  Culture, blood (Routine X 2) w Reflex to ID Panel     Status: Abnormal   Collection Time: 04/26/23  1:20 AM   Specimen: BLOOD  Result Value Ref Range Status   Specimen Description BLOOD SITE NOT SPECIFIED  Final   Special Requests   Final    BOTTLES DRAWN AEROBIC AND ANAEROBIC Blood Culture adequate volume   Culture  Setup Time   Final    GRAM POSITIVE COCCI GRAM NEGATIVE RODS IN BOTH AEROBIC AND ANAEROBIC BOTTLES CRITICAL RESULT CALLED TO, READ BACK BY AND VERIFIED WITH: PHARMD CAREN AMEND ON 04/26/23 @ 1737 BY DRT Performed at [facility], [address], [place], [place] [address]    Culture PSEUDOMONAS AERUGINOSA STAPHYLOCOCCUS AUREUS  (A)  Final   Report Status 04/29/2023 FINAL  Final   Organism ID, Bacteria PSEUDOMONAS AERUGINOSA  Final   Organism ID, Bacteria STAPHYLOCOCCUS AUREUS  Final      Susceptibility   Pseudomonas aeruginosa - MIC*    CEFTAZIDIME 4 SENSITIVE Sensitive     CIPROFLOXACIN <=0.25 SENSITIVE Sensitive     GENTAMICIN <=1 SENSITIVE Sensitive     IMIPENEM 1 SENSITIVE Sensitive     PIP/TAZO 8 SENSITIVE Sensitive     CEFEPIME 2 SENSITIVE Sensitive     * PSEUDOMONAS AERUGINOSA   Staphylococcus aureus - MIC*    CIPROFLOXACIN <=0.5 SENSITIVE Sensitive     ERYTHROMYCIN <=0.25 SENSITIVE Sensitive     GENTAMICIN <=0.5 SENSITIVE Sensitive     OXACILLIN 0.5 SENSITIVE Sensitive     TETRACYCLINE <=1 SENSITIVE Sensitive     VANCOMYCIN 1 SENSITIVE Sensitive     TRIMETH/SULFA <=10 SENSITIVE Sensitive     CLINDAMYCIN  <=0.25 SENSITIVE Sensitive     RIFAMPIN <=0.5 SENSITIVE Sensitive     Inducible Clindamycin NEGATIVE Sensitive     LINEZOLID 2 SENSITIVE Sensitive     * STAPHYLOCOCCUS AUREUS  Blood Culture ID Panel (Reflexed)     Status: Abnormal   Collection Time: 04/26/23  1:20 AM  Result Value Ref Range Status   Enterococcus faecalis NOT DETECTED NOT DETECTED Final   Enterococcus Faecium NOT DETECTED NOT DETECTED Final   Listeria monocytogenes NOT DETECTED NOT DETECTED Final   Staphylococcus species DETECTED (A) NOT DETECTED Final    Comment: CRITICAL RESULT CALLED TO, READ BACK BY AND VERIFIED WITH:  PHARMD CAREN AMEND ON 04/26/23 @ 1737 BY DRT    Staphylococcus aureus (BCID) DETECTED (A) NOT DETECTED Final    Comment: CRITICAL RESULT CALLED TO, READ BACK BY AND VERIFIED WITH: PHARMD CAREN AMEND ON 04/26/23 @ 1737 BY DRT    Staphylococcus epidermidis NOT DETECTED NOT DETECTED Final   Staphylococcus lugdunensis NOT DETECTED NOT DETECTED Final   Streptococcus species NOT DETECTED NOT DETECTED Final   Streptococcus agalactiae NOT DETECTED NOT DETECTED Final   Streptococcus pneumoniae NOT DETECTED NOT DETECTED Final   Streptococcus pyogenes NOT DETECTED NOT DETECTED Final   A.calcoaceticus-baumannii NOT DETECTED NOT DETECTED Final   Bacteroides fragilis NOT DETECTED NOT DETECTED Final   Enterobacterales NOT DETECTED NOT DETECTED Final   Enterobacter cloacae complex NOT DETECTED NOT DETECTED Final   Escherichia coli NOT DETECTED NOT DETECTED Final   Klebsiella aerogenes NOT DETECTED NOT DETECTED Final   Klebsiella oxytoca NOT DETECTED NOT DETECTED Final   Klebsiella pneumoniae NOT DETECTED NOT DETECTED Final   Proteus species NOT DETECTED NOT DETECTED Final   Salmonella species NOT DETECTED NOT DETECTED Final   Serratia marcescens NOT DETECTED NOT DETECTED Final   Haemophilus influenzae NOT DETECTED NOT DETECTED Final   Neisseria meningitidis NOT DETECTED NOT DETECTED Final   Pseudomonas aeruginosa  DETECTED (A) NOT DETECTED Final    Comment: CRITICAL RESULT CALLED TO, READ BACK BY AND VERIFIED WITH: PHARMD CAREN AMEND ON 04/26/23 @ 1737 BY DRT    Stenotrophomonas maltophilia NOT DETECTED NOT DETECTED Final   Candida albicans NOT DETECTED NOT DETECTED Final   Candida auris NOT DETECTED NOT DETECTED Final   Candida glabrata NOT DETECTED NOT DETECTED Final   Candida krusei NOT DETECTED NOT DETECTED Final   Candida parapsilosis NOT DETECTED NOT DETECTED Final   Candida tropicalis NOT DETECTED NOT DETECTED Final   Cryptococcus neoformans/gattii NOT DETECTED NOT DETECTED Final   CTX-M ESBL NOT DETECTED NOT DETECTED Final   Carbapenem resistance IMP NOT DETECTED NOT DETECTED Final   Carbapenem resistance KPC NOT DETECTED NOT DETECTED Final   Meth resistant mecA/C and MREJ NOT DETECTED NOT DETECTED Final   Carbapenem resistance NDM NOT DETECTED NOT DETECTED Final   Carbapenem resistance VIM NOT DETECTED NOT DETECTED Final    Comment: Performed at [facility], [address] [place], [place] [address]  Culture, blood (Routine X 2) w Reflex to ID Panel     Status: Abnormal   Collection Time: 04/26/23  1:50 AM   Specimen: BLOOD LEFT ARM  Result Value Ref Range Status   Specimen Description BLOOD LEFT ARM  Final   Special Requests   Final    BOTTLES DRAWN AEROBIC AND ANAEROBIC Blood Culture adequate volume   Culture  Setup Time   Final    GRAM POSITIVE COCCI GRAM NEGATIVE RODS CRITICAL RESULT CALLED TO, READ BACK BY AND VERIFIED WITH: PHARMD CAREN AMEND ON 04/26/23 @ 1737 BY DRT    Culture (A)  Final    PSEUDOMONAS AERUGINOSA STAPHYLOCOCCUS AUREUS SUSCEPTIBILITIES PERFORMED ON PREVIOUS CULTURE WITHIN THE LAST 5 DAYS. Performed at [facility], [address], [place], [place] [address]    Report Status 04/29/2023 FINAL  Final  Respiratory (~20 pathogens) panel by PCR     Status: None   Collection Time: 04/26/23  4:11 AM   Specimen: Anterior Nasal Swab; Respiratory  Result  Value Ref Range Status   Adenovirus NOT DETECTED NOT DETECTED Final   Coronavirus 229E NOT DETECTED NOT DETECTED Final  Comment: (NOTE) The Coronavirus on the Respiratory Panel, DOES NOT test for the novel  Coronavirus (2019 nCoV)    Coronavirus HKU1 NOT DETECTED NOT DETECTED Final   Coronavirus NL63 NOT DETECTED NOT DETECTED Final   Coronavirus OC43 NOT DETECTED NOT DETECTED Final   Metapneumovirus NOT DETECTED NOT DETECTED Final   Rhinovirus / Enterovirus NOT DETECTED NOT DETECTED Final   Influenza A NOT DETECTED NOT DETECTED Final   Influenza B NOT DETECTED NOT DETECTED Final   Parainfluenza Virus 1 NOT DETECTED NOT DETECTED Final   Parainfluenza Virus 2 NOT DETECTED NOT DETECTED Final   Parainfluenza Virus 3 NOT DETECTED NOT DETECTED Final   Parainfluenza Virus 4 NOT DETECTED NOT DETECTED Final   Respiratory Syncytial Virus NOT DETECTED NOT DETECTED Final   Bordetella pertussis NOT DETECTED NOT DETECTED Final   Bordetella Parapertussis NOT DETECTED NOT DETECTED Final   Chlamydophila pneumoniae NOT DETECTED NOT DETECTED Final   Mycoplasma pneumoniae NOT DETECTED NOT DETECTED Final    Comment: Performed at [facility], [address] [place], [place] [address]  SARS Coronavirus 2 by RT PCR (hospital order, performed in [facility] hospital lab) *cepheid single result test* Anterior Nasal Swab     Status: Abnormal   Collection Time: 04/26/23  4:11 AM   Specimen: Anterior Nasal Swab  Result Value Ref Range Status   SARS Coronavirus 2 by RT PCR POSITIVE (A) NEGATIVE Final    Comment: Performed at [facility], [address] [place], [place] [address]  MRSA Next Gen by PCR, Nasal     Status: None   Collection Time: 04/26/23  5:08 AM   Specimen: Nasal Mucosa; Nasal Swab  Result Value Ref Range Status   MRSA by PCR Next Gen NOT DETECTED NOT DETECTED Final    Comment: (NOTE) The GeneXpert MRSA Assay (FDA approved for NASAL specimens only), is one component of a  comprehensive MRSA colonization surveillance program. It is not intended to diagnose MRSA infection nor to guide or monitor treatment for MRSA infections. Test performance is not FDA approved in patients less than 29 years old. Performed at [facility], [address] [place], [place] [address]   Culture, blood (Routine X 2) w Reflex to ID Panel     Status: None   Collection Time: 04/28/23  9:43 AM   Specimen: BLOOD LEFT HAND  Result Value Ref Range Status   Specimen Description BLOOD LEFT HAND  Final   Special Requests   Final    BOTTLES DRAWN AEROBIC ONLY Blood Culture adequate volume   Culture   Final    NO GROWTH 5 DAYS Performed at [facility], [address], [place], [place] [address]    Report Status 05/03/2023 FINAL  Final  Culture, blood (Routine X 2) w Reflex to ID Panel     Status: None   Collection Time: 04/28/23  9:57 AM   Specimen: BLOOD LEFT HAND  Result Value Ref Range Status   Specimen Description BLOOD LEFT HAND  Final   Special Requests   Final    BOTTLES DRAWN AEROBIC ONLY Blood Culture adequate volume   Culture   Final    NO GROWTH 5 DAYS Performed at [facility], [address], [place], [place] [address]    Report Status 05/03/2023 FINAL  Final     Serology:   Imaging: If present, new imagings (plain films, ct scans, and mri) have been personally visualized and interpreted;  radiology reports have been reviewed. Decision making incorporated into the Impression / Recommendations.  7/6 tte  1. Left ventricular ejection fraction, by estimation, is 60 to 65%. The left ventricle has normal function. The left ventricle has no regional wall motion abnormalities. There is mild left ventricular hypertrophy. Left ventricular diastolic parameters  are consistent with Grade I diastolic dysfunction (impaired relaxation). Elevated left ventricular end-diastolic pressure.   2. Right ventricular systolic function is severely reduced. The right  ventricular size is severely enlarged.   3. Left atrial size was moderately dilated.   4. Right atrial size was mildly dilated.   5. The mitral valve is abnormal. Trivial mitral valve regurgitation. No evidence of mitral stenosis. Moderate mitral annular calcification.   6. Tricuspid valve regurgitation is moderate.   7. The aortic valve is tricuspid. There is mild calcification of the aortic valve. There is mild thickening of the aortic valve. Aortic valve regurgitation is mild. Aortic valve sclerosis is present, with no evidence of aortic valve stenosis.   8. The inferior vena cava is normal in size with greater than 50% respiratory variability, suggesting right atrial pressure of 3 mmHg.     7/5 chest abd pelv ct with chest cta No evidence of pulmonary embolism.   Right lower lobe pneumonia.  Trace right pleural effusion.   Chronic interstitial lung disease, as above.   Moderate hiatal hernia.  Otherwise negative CT abdomen/pelvis.    Raymondo Band, MD [facility] [phone] pager    05/03/2023, 12:51 PM

## 2023-05-03 NOTE — Progress Notes (Signed)
Palliative Medicine Inpatient Follow Up Note HPI: 77 y.o. female  with past medical history of ILD, pulmonary fibrosis, rheumatoid arthritis on Enbrel and methotrexate, pulmonary HTN, R heart failure,  admitted on 04/26/2023 with septic shock related to COVID and multi species bacteremia. Initially required levophed but off now. Currently on HFNC 40 L/min 70% FiO2. Palliative medicine consulted for symptom management.   Today's Discussion 05/03/2023  *Please note that this is a verbal dictation therefore any spelling or grammatical errors are due to the "[employer]agon [employer] One" system interpretation.  Chart reviewed inclusive of vital signs, progress notes, laboratory results, and diagnostic images.   I met this morning with Sakhia and her spouse, Gerlene Burdock. Roxy was fairly sleepy as she had received mirtazapine overnight and Gerlene Burdock feels perhaps this is one of the only two nights she has slept well since hospitalized. He shares that he would like her to remain resting if possible.   I was able to speak yo Devann though she quickly fell back asleep.  I created space and opportunity for [name] to explore thoughts feelings and fears regarding his wife's current medical situation. We discussed her present health and the concerns associated with a variety of factors notably her sepsis in the setting of pneumonia, ILD, and her COVID-19 infection. We reviewed that she has a great deal of underlying health burden aside from her active infection(s).   We discussed artificial nutrition which at this time Gerlene Burdock does not feel per prior conversations that Taiyah wants to pursue. We discussed increasing mirtazapine this evening as he remains hopeful patients appetite will start to pick up over the weekend. [name] is aware that is Emmanuelle's appetite does not improve and if she remains to be unable to mobilize then the outlook is poor. We did go on to discuss possible comfort care/hospice if improvements are not seen.    At this time Gerlene Burdock remains hopeful that she may improve and he would like to allow the weekend to further assess if this will be possible.  Questions and concerns addressed/Palliative Support Provided.   Objective Assessment: Vital Signs Vitals:   05/03/23 0855 05/03/23 1121  BP: (!) 103/59 (!) 90/51  Pulse: 91 84  Resp: 20 (!) 26  Temp:  98 F (36.7 C)  SpO2: 90% 98%    Intake/Output Summary (Last 24 hours) at 05/03/2023 1251 Last data filed at 05/03/2023 1610 Gross per 24 hour  Intake 471.32 ml  Output 300 ml  Net 171.32 ml   Last Weight  Most recent update: 05/03/2023  6:16 AM    Weight  57.5 kg (126 lb 12.2 oz)            Gen:  Elderly caucasian F in NAD HEENT: moist mucous membranes CV: Regular rate and rhythm  PULM:  On 15LPM/60%FiO2 HFNC, breathing is even and nonlabored ABD: soft/nontender/nondistended  EXT: No edema  Neuro: Alert and oriented to self this morning  SUMMARY OF RECOMMENDATIONS   DNAR/DNI  Allow the weekend to see if improvements can be made  Will increase mirtazapine to 15mg  PO at bedtime  Discussed the option of comfort care if patient neglects to improve  Ongoing PMT support  Billing based on MDM: High ______________________________________________________________________________________ Lamarr Lulas [facility] Team Team Cell Phone: [phone] Please utilize secure chat with additional questions, if there is no response within 30 minutes please call the above phone number  Palliative Medicine Team providers are available by phone from 7am to 7pm daily and can  be reached through the team cell phone.  Should this patient require assistance outside of these hours, please call the patient's attending physician.

## 2023-05-04 DIAGNOSIS — A499 Bacterial infection, unspecified: Secondary | ICD-10-CM | POA: Diagnosis not present

## 2023-05-04 LAB — GLUCOSE, CAPILLARY
Glucose-Capillary: 105 mg/dL — ABNORMAL HIGH (ref 70–99)
Glucose-Capillary: 95 mg/dL (ref 70–99)
Glucose-Capillary: 123 mg/dL — ABNORMAL HIGH (ref 70–99)
Glucose-Capillary: 92 mg/dL (ref 70–99)

## 2023-05-04 LAB — BASIC METABOLIC PANEL
Anion gap: 9 (ref 5–15)
BUN: 26 mg/dL — ABNORMAL HIGH (ref 8–23)
CO2: 27 mmol/L (ref 22–32)
Calcium: 8 mg/dL — ABNORMAL LOW (ref 8.9–10.3)
Chloride: 100 mmol/L (ref 98–111)
Creatinine, Ser: 0.95 mg/dL (ref 0.44–1.00)
GFR, Estimated: >60 mL/min (ref >60)
Glucose, Bld: 129 mg/dL — ABNORMAL HIGH (ref 70–99)
Potassium: 3.5 mmol/L (ref 3.5–5.1)
Sodium: 136 mmol/L (ref 135–145)

## 2023-05-04 LAB — FOLATE: Folate: 8.8 ng/mL (ref >5.9)

## 2023-05-04 LAB — CBC
HCT: 27 % — ABNORMAL LOW (ref 36.0–46.0)
Hemoglobin: 8.2 g/dL — ABNORMAL LOW (ref 12.0–15.0)
MCH: 31.9 pg (ref 26.0–34.0)
MCHC: 30.4 g/dL (ref 30.0–36.0)
MCV: 105.1 fL — ABNORMAL HIGH (ref 80.0–100.0)
Platelets: 124 10*3/uL — ABNORMAL LOW (ref 150–400)
RBC: 2.57 MIL/uL — ABNORMAL LOW (ref 3.87–5.11)
RDW: 18 % — ABNORMAL HIGH (ref 11.5–15.5)
WBC: 10.4 10*3/uL (ref 4.0–10.5)
nRBC: 0.4 % — ABNORMAL HIGH (ref 0.0–0.2)

## 2023-05-04 LAB — VITAMIN B12: Vitamin B-12: 4213 pg/mL — ABNORMAL HIGH (ref 180–914)

## 2023-05-04 MED ORDER — ACETAMINOPHEN 500 MG PO TABS
1000.0000 mg | ORAL_TABLET | Freq: Four times a day (QID) | ORAL | Status: DC | PRN
  Administered 2023-05-04: 1000 mg via ORAL
  Filled 2023-05-04: qty 2

## 2023-05-04 MED ORDER — POTASSIUM CHLORIDE 20 MEQ PO PACK
40.0000 meq | PACK | Freq: Once | ORAL | Status: DC
  Filled 2023-05-04: qty 2

## 2023-05-04 NOTE — Plan of Care (Signed)

## 2023-05-04 NOTE — Progress Notes (Addendum)
PROGRESS NOTE  Madison Holden  UJW:[id number] DOB: 02/04/1946 DOA: 04/26/2023 PCP: Henrine Screws, MD   Brief Narrative: Patient is a 77 female with history of ILD/pulmonary fibrosis, rheumatoid arthritis on Enbrel and methotrexate, pulmonary hypertension, heart failure who presented with worsening shortness of breath.  COVID, to be positive.  She was hypoxic to 76% on home nasal cannula.  On presentation,she  required BiPAP, chest x-ray showed chronic ILD with a scattered airspace opacities.  Lab work showed elevated lactic acid of 4.1, BNP of 839, WBC of 21.5.  She was hypotensive, required low-dose Levophed.  Admitted under ICU service.  Hospital course remarkable for persistent requirement of high flow oxygen.  Finding of polymicrobial pneumonia with MSSA, Pseudomonas.  Blood cultures showed Staphylococcus, Pseudomonas.  Patient transferred to [facility] service on 7/9.  PCCM/ID  were following.    Continue to try to wean off oxygen,today at 18  L/min. PT /OT recommended SNF.  Palliative care following for goals of care, patient has very poor oral intake.  Assessment & Plan:  Principal Problem:   Polymicrobial bacterial infection Active Problems:   Respiratory failure (HCC)   COVID-19 virus infection   PAH (pulmonary artery hypertension) (HCC)   Septic shock (HCC)   Neck pain  Severe sepsis with septic shock secondary to pneumonia/MSSA/Pseudomonas bacteremia: Hypotensive, hypoxic on presentation with elevated lactate.  Currently hemodynamically stable. On Cefazolin, oral ciprofloxacin.  Plan for continuing ciprofloxacin through 7/16, plan for changing cefazolin to linezolid next week. ID following.  Sepsis physiology has resolved.  Currently afebrile.  Leukocytosis resolved  Acute on chronic hypoxic respiratory failure/ILD/pulmonary fibrosis: On 3 L of oxygen at home.  Had to be put on BiPAP on presentation.  On home oxygen.  Currently on high flow oxygen but tapered to 18 L/min.  PCCM were  following.  This is likely secondary to superimposed pneumonia, concurrent COVID infection.  Continue to try to wean the oxygen.  Continue current bronchodilators.  On prednisone taper  Neck pain: This is most from her rheumatic arthritis.  Will consider MRI C-spine if it continues to bother her but she says this is chronic neck pain.  Patient uses baclofen at home but want to hold for now because she is on high flow oxygen.  Continue high-dose Tylenol  Chronic diastolic CHF, pulmonary hypertension: Currently appears euvolemic.  Holding GDMT in the setting of shock.  AKI/hypokalemia/hypophosphatemia: Kidney function significantly improved and back to baseline.  Electrolytes being monitored and supplemented as needed  Diabetes type 2: On Farxiga at home.  Currently on sliding scale  insulin  History of rheumatoid arthritis: On Enbrel, methotrexate at home.Has deformities on hands  Anemia/thrombocytopenia: Likely in the setting of critical illness.  Monitor.No evidence of acute blood loss.  Optimal thiamine and folic acid  COVID infection: Continue supportive care, tapering steroids, bronchodilators as needed  Goals of care: Severe respiratory failure from pneumonia/COVID.  Palliative care consulted.  CODE STATUS DNR.  Plan is not to escalate the current treatment plan but continue supportive care, weaning of oxygen.  Has very poor oral intake.  If she continues to go like this, comfort care may be necessary.  Poor oral intake: Started on mirtazapine, dietitian following  Debility/deconditioning: PT/OT consulted.  Recommended skilled nursing facility on discharge.     Nutrition Problem: Severe Malnutrition Etiology: acute illness Pressure Injury 04/26/23 Sacrum Left (Active)  04/26/23 0500  Location: Sacrum  Location Orientation: Left  Staging:   Wound Description (Comments):   Present on Admission:  Dressing Type Foam - Lift dressing to assess site every shift 05/03/23 2000     DVT prophylaxis:enoxaparin (LOVENOX) injection 40 mg Start: 04/27/23 1200 SCDs Start: 04/26/23 0338     Code Status: DNR  Family Communication: Discussed with husband at bedside on 7/13  Patient status:Inpatient  Patient is from :home  Anticipated discharge to:SNF  Estimated DC date:not sure   Consultants: PCCM,ID,palliative care  Procedures:None  Antimicrobials:  Anti-infectives (From admission, onward)    Start     Dose/Rate Route Frequency Ordered Stop   05/01/23 2000  ciprofloxacin (CIPRO) IVPB 400 mg  Status:  Discontinued        400 mg 200 mL/hr over 60 Minutes Intravenous Every 12 hours 05/01/23 1059 05/01/23 1622   05/01/23 2000  ciprofloxacin (CIPRO) tablet 750 mg        750 mg Oral 2 times daily 05/01/23 1622 05/07/23 1959   04/29/23 2200  ceFAZolin (ANCEF) IVPB 2g/100 mL premix        2 g 200 mL/hr over 30 Minutes Intravenous Every 8 hours 04/29/23 1404     04/29/23 2115  ciprofloxacin (CIPRO) IVPB 400 mg        400 mg 200 mL/hr over 60 Minutes Intravenous  Once 04/29/23 2017 04/30/23 0130   04/29/23 2000  ciprofloxacin (CIPRO) tablet 750 mg  Status:  Discontinued        750 mg Oral 2 times daily 04/29/23 1404 05/01/23 1059   04/26/23 2200  cefTRIAXone (ROCEPHIN) 1 g in sodium chloride 0.9 % 100 mL IVPB  Status:  Discontinued        1 g 200 mL/hr over 30 Minutes Intravenous Every 24 hours 04/26/23 0348 04/26/23 1107   04/26/23 1215  ceFEPIme (MAXIPIME) 2 g in sodium chloride 0.9 % 100 mL IVPB  Status:  Discontinued        2 g 200 mL/hr over 30 Minutes Intravenous Every 12 hours 04/26/23 1118 04/29/23 1404   04/26/23 0500  azithromycin (ZITHROMAX) 500 mg in sodium chloride 0.9 % 250 mL IVPB  Status:  Discontinued        500 mg 250 mL/hr over 60 Minutes Intravenous Daily 04/26/23 0348 04/26/23 1107   04/26/23 0130  cefTRIAXone (ROCEPHIN) 2 g in sodium chloride 0.9 % 100 mL IVPB        2 g 200 mL/hr over 30 Minutes Intravenous  Once 04/26/23 0118  04/26/23 0215       Subjective:  Patient seen and examined the bedside today.  Hemodynamically stable.  Lying on bed.  Alert and awake and mostly oriented.  Not in respiratory distress.  Speaking in full sentences.  Continues to require high flow oxygen but significantly better than last few days.  Husband at bedside.  Very poor oral intake, wants to drink water only.  Encouraged for oral intake.   Objective: Vitals:   05/04/23 0717 05/04/23 0721 05/04/23 0723 05/04/23 0933  BP:    115/69  Pulse: 83   84  Resp: (!) 21   20  Temp:    97.9 F (36.6 C)  TempSrc:    Oral  SpO2: 93% 93% 94% 96%  Weight:        Intake/Output Summary (Last 24 hours) at 05/04/2023 1019 Last data filed at 05/04/2023 0930 Gross per 24 hour  Intake --  Output 1200 ml  Net -1200 ml   Filed Weights   05/02/23 0500 05/03/23 0500 05/04/23 0500  Weight: 42.9 kg 57.5 kg 57.6 kg  Examination:   General exam: Weak, deconditioned, lying in bed HEENT: PERRL Respiratory system: Bilateral crackles Cardiovascular system: S1 & S2 heard, RRR.  Gastrointestinal system: Abdomen is nondistended, soft and nontender. Central nervous system: Alert and oriented Extremities: No edema, no clubbing ,no cyanosis, deformities of hands Skin: No rashes, no ulcers,no icterus      Data Reviewed: I have personally reviewed following labs and imaging studies  CBC: Recent Labs  Lab 04/28/23 0021 04/29/23 0224 04/30/23 0117 05/01/23 0133 05/03/23 0756 05/04/23 0108  WBC 21.6* 18.5* 13.1* 13.1* 8.1 10.4  NEUTROABS 17.8*  --   --  11.0*  --   --   HGB 9.9* 9.0* 8.7* 9.2* 7.6* 8.2*  HCT 30.1* 29.5* 28.3* 30.6* 25.0* 27.0*  MCV 102.7* 105.7* 105.6* 108.1* 104.6* 105.1*  PLT 70* 61* 77* 107* 99* 124*   Basic Metabolic Panel: Recent Labs  Lab 04/28/23 1000 04/29/23 0224 04/30/23 0117 05/01/23 0133 05/01/23 0134 05/03/23 0756 05/04/23 0108  NA 141   < > 137 137 135 134* 136  K 3.6   < > 3.8 3.8 3.8 3.4* 3.5   CL 105   < > 104 99 99 100 100  CO2 23   < > 22 24 22 23 27   GLUCOSE 129*   < > 141* 110* 109* 137* 129*  BUN 19   < > 22 25* 25* 29* 26*  CREATININE 0.61   < > 0.68 0.82 0.77 0.90 0.95  CALCIUM 8.5*   < > 8.7* 9.0 8.9 7.7* 8.0*  MG 2.0  --  1.9 1.8  --   --   --   PHOS 2.3*  --  2.1*  --  2.6  --   --    < > = values in this interval not displayed.     Recent Results (from the past 240 hour(s))  Culture, blood (Routine X 2) w Reflex to ID Panel     Status: Abnormal   Collection Time: 04/26/23  1:20 AM   Specimen: BLOOD  Result Value Ref Range Status   Specimen Description BLOOD SITE NOT SPECIFIED  Final   Special Requests   Final    BOTTLES DRAWN AEROBIC AND ANAEROBIC Blood Culture adequate volume   Culture  Setup Time   Final    GRAM POSITIVE COCCI GRAM NEGATIVE RODS IN BOTH AEROBIC AND ANAEROBIC BOTTLES CRITICAL RESULT CALLED TO, READ BACK BY AND VERIFIED WITH: PHARMD CAREN AMEND ON 04/26/23 @ 1737 BY DRT Performed at [facility], [address] [place], [place] [address]    Culture PSEUDOMONAS AERUGINOSA STAPHYLOCOCCUS AUREUS  (A)  Final   Report Status 04/29/2023 FINAL  Final   Organism ID, Bacteria PSEUDOMONAS AERUGINOSA  Final   Organism ID, Bacteria STAPHYLOCOCCUS AUREUS  Final      Susceptibility   Pseudomonas aeruginosa - MIC*    CEFTAZIDIME 4 SENSITIVE Sensitive     CIPROFLOXACIN <=0.25 SENSITIVE Sensitive     GENTAMICIN <=1 SENSITIVE Sensitive     IMIPENEM 1 SENSITIVE Sensitive     PIP/TAZO 8 SENSITIVE Sensitive     CEFEPIME 2 SENSITIVE Sensitive     * PSEUDOMONAS AERUGINOSA   Staphylococcus aureus - MIC*    CIPROFLOXACIN <=0.5 SENSITIVE Sensitive     ERYTHROMYCIN <=0.25 SENSITIVE Sensitive     GENTAMICIN <=0.5 SENSITIVE Sensitive     OXACILLIN 0.5 SENSITIVE Sensitive     TETRACYCLINE <=1 SENSITIVE Sensitive     VANCOMYCIN 1 SENSITIVE Sensitive  TRIMETH/SULFA <=10 SENSITIVE Sensitive     CLINDAMYCIN <=0.25 SENSITIVE Sensitive     RIFAMPIN  <=0.5 SENSITIVE Sensitive     Inducible Clindamycin NEGATIVE Sensitive     LINEZOLID 2 SENSITIVE Sensitive     * STAPHYLOCOCCUS AUREUS  Blood Culture ID Panel (Reflexed)     Status: Abnormal   Collection Time: 04/26/23  1:20 AM  Result Value Ref Range Status   Enterococcus faecalis NOT DETECTED NOT DETECTED Final   Enterococcus Faecium NOT DETECTED NOT DETECTED Final   Listeria monocytogenes NOT DETECTED NOT DETECTED Final   Staphylococcus species DETECTED (A) NOT DETECTED Final    Comment: CRITICAL RESULT CALLED TO, READ BACK BY AND VERIFIED WITH: PHARMD CAREN AMEND ON 04/26/23 @ 1737 BY DRT    Staphylococcus aureus (BCID) DETECTED (A) NOT DETECTED Final    Comment: CRITICAL RESULT CALLED TO, READ BACK BY AND VERIFIED WITH: PHARMD CAREN AMEND ON 04/26/23 @ 1737 BY DRT    Staphylococcus epidermidis NOT DETECTED NOT DETECTED Final   Staphylococcus lugdunensis NOT DETECTED NOT DETECTED Final   Streptococcus species NOT DETECTED NOT DETECTED Final   Streptococcus agalactiae NOT DETECTED NOT DETECTED Final   Streptococcus pneumoniae NOT DETECTED NOT DETECTED Final   Streptococcus pyogenes NOT DETECTED NOT DETECTED Final   A.calcoaceticus-baumannii NOT DETECTED NOT DETECTED Final   Bacteroides fragilis NOT DETECTED NOT DETECTED Final   Enterobacterales NOT DETECTED NOT DETECTED Final   Enterobacter cloacae complex NOT DETECTED NOT DETECTED Final   Escherichia coli NOT DETECTED NOT DETECTED Final   Klebsiella aerogenes NOT DETECTED NOT DETECTED Final   Klebsiella oxytoca NOT DETECTED NOT DETECTED Final   Klebsiella pneumoniae NOT DETECTED NOT DETECTED Final   Proteus species NOT DETECTED NOT DETECTED Final   Salmonella species NOT DETECTED NOT DETECTED Final   Serratia marcescens NOT DETECTED NOT DETECTED Final   Haemophilus influenzae NOT DETECTED NOT DETECTED Final   Neisseria meningitidis NOT DETECTED NOT DETECTED Final   Pseudomonas aeruginosa DETECTED (A) NOT DETECTED Final     Comment: CRITICAL RESULT CALLED TO, READ BACK BY AND VERIFIED WITH: PHARMD CAREN AMEND ON 04/26/23 @ 1737 BY DRT    Stenotrophomonas maltophilia NOT DETECTED NOT DETECTED Final   Candida albicans NOT DETECTED NOT DETECTED Final   Candida auris NOT DETECTED NOT DETECTED Final   Candida glabrata NOT DETECTED NOT DETECTED Final   Candida krusei NOT DETECTED NOT DETECTED Final   Candida parapsilosis NOT DETECTED NOT DETECTED Final   Candida tropicalis NOT DETECTED NOT DETECTED Final   Cryptococcus neoformans/gattii NOT DETECTED NOT DETECTED Final   CTX-M ESBL NOT DETECTED NOT DETECTED Final   Carbapenem resistance IMP NOT DETECTED NOT DETECTED Final   Carbapenem resistance KPC NOT DETECTED NOT DETECTED Final   Meth resistant mecA/C and MREJ NOT DETECTED NOT DETECTED Final   Carbapenem resistance NDM NOT DETECTED NOT DETECTED Final   Carbapenem resistance VIM NOT DETECTED NOT DETECTED Final    Comment: Performed at [facility], [address], [place], [place] [address]  Culture, blood (Routine X 2) w Reflex to ID Panel     Status: Abnormal   Collection Time: 04/26/23  1:50 AM   Specimen: BLOOD LEFT ARM  Result Value Ref Range Status   Specimen Description BLOOD LEFT ARM  Final   Special Requests   Final    BOTTLES DRAWN AEROBIC AND ANAEROBIC Blood Culture adequate volume   Culture  Setup Time   Final    GRAM POSITIVE COCCI GRAM NEGATIVE  RODS CRITICAL RESULT CALLED TO, READ BACK BY AND VERIFIED WITH: PHARMD CAREN AMEND ON 04/26/23 @ 1737 BY DRT    Culture (A)  Final    PSEUDOMONAS AERUGINOSA STAPHYLOCOCCUS AUREUS SUSCEPTIBILITIES PERFORMED ON PREVIOUS CULTURE WITHIN THE LAST 5 DAYS. Performed at [facility], [address], [place], [place] [address]    Report Status 04/29/2023 FINAL  Final  Respiratory (~20 pathogens) panel by PCR     Status: None   Collection Time: 04/26/23  4:11 AM   Specimen: Anterior Nasal Swab; Respiratory  Result Value Ref Range Status    Adenovirus NOT DETECTED NOT DETECTED Final   Coronavirus 229E NOT DETECTED NOT DETECTED Final    Comment: (NOTE) The Coronavirus on the Respiratory Panel, DOES NOT test for the novel  Coronavirus (2019 nCoV)    Coronavirus HKU1 NOT DETECTED NOT DETECTED Final   Coronavirus NL63 NOT DETECTED NOT DETECTED Final   Coronavirus OC43 NOT DETECTED NOT DETECTED Final   Metapneumovirus NOT DETECTED NOT DETECTED Final   Rhinovirus / Enterovirus NOT DETECTED NOT DETECTED Final   Influenza A NOT DETECTED NOT DETECTED Final   Influenza B NOT DETECTED NOT DETECTED Final   Parainfluenza Virus 1 NOT DETECTED NOT DETECTED Final   Parainfluenza Virus 2 NOT DETECTED NOT DETECTED Final   Parainfluenza Virus 3 NOT DETECTED NOT DETECTED Final   Parainfluenza Virus 4 NOT DETECTED NOT DETECTED Final   Respiratory Syncytial Virus NOT DETECTED NOT DETECTED Final   Bordetella pertussis NOT DETECTED NOT DETECTED Final   Bordetella Parapertussis NOT DETECTED NOT DETECTED Final   Chlamydophila pneumoniae NOT DETECTED NOT DETECTED Final   Mycoplasma pneumoniae NOT DETECTED NOT DETECTED Final    Comment: Performed at [facility], [address] [place], [place] [address]  SARS Coronavirus 2 by RT PCR (hospital order, performed in [facility] hospital lab) *cepheid single result test* Anterior Nasal Swab     Status: Abnormal   Collection Time: 04/26/23  4:11 AM   Specimen: Anterior Nasal Swab  Result Value Ref Range Status   SARS Coronavirus 2 by RT PCR POSITIVE (A) NEGATIVE Final    Comment: Performed at [facility], [address], [place], [place] [address]  MRSA Next Gen by PCR, Nasal     Status: None   Collection Time: 04/26/23  5:08 AM   Specimen: Nasal Mucosa; Nasal Swab  Result Value Ref Range Status   MRSA by PCR Next Gen NOT DETECTED NOT DETECTED Final    Comment: (NOTE) The GeneXpert MRSA Assay (FDA approved for NASAL specimens only), is one component of a comprehensive MRSA colonization  surveillance program. It is not intended to diagnose MRSA infection nor to guide or monitor treatment for MRSA infections. Test performance is not FDA approved in patients less than 51 years old. Performed at [facility], [address] [place], [place] [address]   Culture, blood (Routine X 2) w Reflex to ID Panel     Status: None   Collection Time: 04/28/23  9:43 AM   Specimen: BLOOD LEFT HAND  Result Value Ref Range Status   Specimen Description BLOOD LEFT HAND  Final   Special Requests   Final    BOTTLES DRAWN AEROBIC ONLY Blood Culture adequate volume   Culture   Final    NO GROWTH 5 DAYS Performed at [facility], [address], [place], [place] [address]    Report Status 05/03/2023 FINAL  Final  Culture,  blood (Routine X 2) w Reflex to ID Panel     Status: None   Collection Time: 04/28/23  9:57 AM   Specimen: BLOOD LEFT HAND  Result Value Ref Range Status   Specimen Description BLOOD LEFT HAND  Final   Special Requests   Final    BOTTLES DRAWN AEROBIC ONLY Blood Culture adequate volume   Culture   Final    NO GROWTH 5 DAYS Performed at [facility], [address] [place], [place] [address]    Report Status 05/03/2023 FINAL  Final     Radiology Studies: No results found.  Scheduled Meds:  arformoterol  15 mcg Nebulization BID   budesonide (PULMICORT) nebulizer solution  0.25 mg Nebulization BID   Chlorhexidine Gluconate Cloth  6 each Topical Q0600   ciprofloxacin  750 mg Oral BID   diclofenac Sodium  4 g Topical QID   enoxaparin (LOVENOX) injection  40 mg Subcutaneous Q24H   feeding supplement  237 mL Oral BID BM   fluticasone  1 spray Each Nare Daily   haloperidol lactate  1 mg Intravenous Once   insulin aspart  0-15 Units Subcutaneous TID WC   insulin aspart  0-5 Units Subcutaneous QHS   midodrine  5 mg Oral TID WC   mirtazapine  15 mg Oral QHS   multivitamin with minerals  1 tablet Oral Daily   pantoprazole  40 mg Oral Daily   [START  ON 05/08/2023] predniSONE  10 mg Oral Q breakfast   [START ON 05/05/2023] predniSONE  20 mg Oral Q breakfast   revefenacin  175 mcg Nebulization Daily   senna  1 tablet Oral QHS   Continuous Infusions:  sodium chloride Stopped (04/27/23 1426)    ceFAZolin (ANCEF) IV 2 g (05/04/23 0627)     LOS: 8 days   Burnadette Pop, MD [facility] P7/13/2024, 10:19 AM

## 2023-05-04 NOTE — Progress Notes (Signed)
RT called due to patient sats dropping. Patient woke up yelling out that she was hurting and getting worked up causing her to take shallow breaths through her mouth. Increased flow to 18L for her increase in WOB and turned Fi02 back up to 50%. Sats now 94% and patient is resting comfortably in the bed.

## 2023-05-05 DIAGNOSIS — Z7189 Other specified counseling: Secondary | ICD-10-CM | POA: Diagnosis not present

## 2023-05-05 DIAGNOSIS — A499 Bacterial infection, unspecified: Secondary | ICD-10-CM | POA: Diagnosis not present

## 2023-05-05 DIAGNOSIS — E43 Unspecified severe protein-calorie malnutrition: Secondary | ICD-10-CM | POA: Insufficient documentation

## 2023-05-05 DIAGNOSIS — Z515 Encounter for palliative care: Secondary | ICD-10-CM | POA: Diagnosis not present

## 2023-05-05 LAB — GLUCOSE, CAPILLARY: Glucose-Capillary: 78 mg/dL (ref 70–99)

## 2023-05-05 MED ORDER — HYDROMORPHONE HCL-NACL 50-0.9 MG/50ML-% IV SOLN: 1.0000 mg/h | INTRAVENOUS | Status: DC

## 2023-05-05 MED ORDER — ONDANSETRON 4 MG PO TBDP: 4.0000 mg | ORAL_TABLET | Freq: Four times a day (QID) | ORAL | Status: DC | PRN

## 2023-05-05 MED ORDER — SODIUM CHLORIDE 0.9 % IV SOLN
1.0000 mg/h | INTRAVENOUS | Status: DC
  Administered 2023-05-05: 1 mg/h via INTRAVENOUS
  Filled 2023-05-05 (×2): qty 5

## 2023-05-05 MED ORDER — HYDROMORPHONE BOLUS VIA INFUSION: 0.5000 mg | INTRAVENOUS | Status: DC | PRN

## 2023-05-05 MED ORDER — ONDANSETRON HCL 4 MG/2ML IJ SOLN: 4.0000 mg | Freq: Four times a day (QID) | INTRAMUSCULAR | Status: DC | PRN

## 2023-05-05 MED ORDER — ACETAMINOPHEN 650 MG RE SUPP: 650.0000 mg | Freq: Four times a day (QID) | RECTAL | Status: DC | PRN

## 2023-05-05 MED ORDER — GLYCOPYRROLATE 0.2 MG/ML IJ SOLN: 0.2000 mg | INTRAMUSCULAR | Status: DC | PRN

## 2023-05-05 MED ORDER — GLYCOPYRROLATE 1 MG PO TABS: 1.0000 mg | ORAL_TABLET | ORAL | Status: DC | PRN

## 2023-05-05 MED ORDER — POLYVINYL ALCOHOL 1.4 % OP SOLN: 1.0000 [drp] | Freq: Four times a day (QID) | OPHTHALMIC | Status: DC | PRN

## 2023-05-05 MED ORDER — LORAZEPAM 2 MG/ML IJ SOLN: 0.5000 mg | INTRAMUSCULAR | Status: DC | PRN

## 2023-05-05 MED ORDER — BIOTENE DRY MOUTH MT LIQD: 15.0000 mL | OROMUCOSAL | Status: DC | PRN

## 2023-05-05 MED ORDER — ACETAMINOPHEN 325 MG PO TABS: 650.0000 mg | ORAL_TABLET | Freq: Four times a day (QID) | ORAL | Status: DC | PRN

## 2023-05-05 MED ORDER — ONDANSETRON HCL 4 MG/2ML IJ SOLN: 4.0000 mg | Freq: Once | INTRAMUSCULAR | Status: DC

## 2023-05-05 NOTE — Progress Notes (Addendum)
Palliative Medicine Inpatient Follow Up Note HPI: 77 y.o. female  with past medical history of ILD, pulmonary fibrosis, rheumatoid arthritis on Enbrel and methotrexate, pulmonary HTN, R heart failure,  admitted on 04/26/2023 with septic shock related to COVID and multi species bacteremia. Initially required levophed but off now. Currently on HFNC 40 L/min 70% FiO2. Palliative medicine consulted for symptom management.   Today's Discussion 05/05/2023  *Please note that this is a verbal dictation therefore any spelling or grammatical errors are due to the "Dragon [employer] One" system interpretation.  Chart reviewed inclusive of vital signs, progress notes, laboratory results, and diagnostic images.   I met this morning with Madison Holden and her spouse, Madison Holden.  We discussed that Madison Holden's situation remains about the same in terms of her respiratory burden. She expresses to me very clearly that she does not desire to live this way. She and I reviewed her medical disease burden inclusive of her ILD, pulmonary fibrosis, & rheumatoid arthritis. We discussed that she has fought long and hard considering her present circumstances.   We discussed Madison Holden's options at this time. One options would be to continue aggressive modalities of care and for her to start eating and drinking more. She is clear about not desiring artificial nutrition via a core-track feeding tube. She shares she has little desire to eat. She only wants to drink "some water". She feels that anything she puts into her body causes profound diarrhea and uses an example from overnight when she took in some applesauce.  We discussed the option of comfort care. We talked about transition to comfort measures in house and what that would entail inclusive of medications to control pain, dyspnea, agitation, nausea, itching, and hiccups.  We discussed stopping all uneccessary measures such as cardiac monitoring, blood draws, needle sticks, and frequent vital  signs.   Madison Holden is very clear that she wants to be comfortable. We reviewed that given her respiratory needs we will need to start a low dose opoid gtt to maintain symptom control and allow her a peaceful passing. I shared that this can often make a patient more sleepy and less interactive. Both Madison Holden and her spouse, Madison Holden vocalized understanding. We reviewed that we would slow decrease the oxygen support and allow Madison Holden to drift off peacefully. She was in favor of this plan.   Madison Holden decline chaplain support. Madison Holden shares there is no one she wants to come and see her at this time.   We reviewed that I would continue to check in throughout the courser of the day.   I shared timing of when Madison Holden will pass is hard to determine though I do not think she will be on earth for very long once we pursue keeping her comfortable.   Questions and concerns addressed/Palliative Support Provided.   Objective Assessment: Vital Signs Vitals:   05/05/23 0725 05/05/23 0727  BP:    Pulse:  74  Resp:  (!) 21  Temp:    SpO2: 98% 98%    Intake/Output Summary (Last 24 hours) at 05/05/2023 0917 Last data filed at 05/04/2023 1701 Gross per 24 hour  Intake 240 ml  Output 850 ml  Net -610 ml   Last Weight  Most recent update: 05/04/2023  6:59 AM    Weight  57.6 kg (126 lb 15.8 oz)            Gen:  Elderly caucasian F in NAD HEENT: moist mucous membranes CV: Regular rate and rhythm  PULM:  On  16LPM/60%FiO2 HFNC, breathing is even and nonlabored ABD: soft/nontender/nondistended  EXT: No edema  Neuro: Alert and oriented x3  SUMMARY OF RECOMMENDATIONS   DNAR/DNI  Comfort Care  Will start low dose dilaudid gtt in the setting of respiratory symptoms and chronic pain from RA  Slow wean of O2  Ongoing PMT support  Anticipate in hospital death  Total Time: 89 Billing based on MDM: High ______________________________________________________________________________________ Madison Holden Madison Holden Palliative Medicine Team Team Cell Phone: [phone] Please utilize secure chat with additional questions, if there is no response within 30 minutes please call the above phone number  Palliative Medicine Team providers are available by phone from 7am to 7pm daily and can be reached through the team cell phone.  Should this patient require assistance outside of these hours, please call the patient's attending physician.

## 2023-05-05 NOTE — Progress Notes (Signed)
PROGRESS NOTE  Madison Holden  BJY:[id number] DOB: 11-13-1945 DOA: 04/26/2023 PCP: Henrine Screws, MD   Brief Narrative: Patient is a 77 female with history of ILD/pulmonary fibrosis, rheumatoid arthritis on Enbrel and methotrexate, pulmonary hypertension, heart failure who presented with worsening shortness of breath.  COVID, to be positive.  She was hypoxic to 76% on home nasal cannula.  On presentation,she  required BiPAP, chest x-ray showed chronic ILD with a scattered airspace opacities.  Lab work showed elevated lactic acid of 4.1, BNP of 839, WBC of 21.5.  She was hypotensive, required low-dose Levophed.  Admitted under ICU service.  Hospital course remarkable for persistent requirement of high flow oxygen.  Finding of polymicrobial pneumonia with MSSA, Pseudomonas.  Blood cultures showed Staphylococcus, Pseudomonas.  Patient transferred to [facility] service on 7/9.  PCCM/ID  were following.  After extensive discussion of goals of care, her care has been transitioned to comfort.  Currently on full comfort care  Assessment & Plan:  Principal Problem:   Polymicrobial bacterial infection Active Problems:   Respiratory failure (HCC)   COVID-19 virus infection   PAH (pulmonary artery hypertension) (HCC)   Septic shock (HCC)   Neck pain  Severe sepsis with septic shock secondary to pneumonia/MSSA/Pseudomonas bacteremia: Hypotensive, hypoxic on presentation with elevated lactate.  Was on Cipro and cefazolin, currently on comfort care.  Acute on chronic hypoxic respiratory failure/ILD/pulmonary fibrosis: On 3 L of oxygen at home.  Had to be put on BiPAP on presentation.  Was on high flow oxygen.  Currently on comfort care.    Neck pain: This is most from her rheumatic arthritis. Continue comfort meds  Chronic diastolic CHF, pulmonary hypertension: Currently appears euvolemic.    Diabetes type 2: On Farxiga at home.    History of rheumatoid arthritis: On Enbrel, methotrexate at home.Has  deformities on hands  Anemia/thrombocytopenia: Likely in the setting of critical illness.    COVID infection: on comfort care  Goals of care: Severe respiratory failure from pneumonia/COVID.  Palliative care consulted. Now on comfort care   Debility/deconditioning: PT/OT consulted.  Recommended skilled nursing facility on discharge.Om comfort care,anticipated hospital death     Nutrition Problem: Severe Malnutrition Etiology: acute illness Pressure Injury 04/26/23 Sacrum Left (Active)  04/26/23 0500  Location: Sacrum  Location Orientation: Left  Staging:   Wound Description (Comments):   Present on Admission:   Dressing Type Foam - Lift dressing to assess site every shift 05/04/23 2000    DVT prophylaxis:     Code Status: DNR  Family Communication: Discussed with husband at bedside on 7/14  Patient status:Inpatient  Patient is from :home  Anticipated discharge to: Residential  hospice versus hospital death    Consultants: PCCM,ID,palliative care  Procedures:None  Antimicrobials:  Anti-infectives (From admission, onward)    Start     Dose/Rate Route Frequency Ordered Stop   05/01/23 2000  ciprofloxacin (CIPRO) IVPB 400 mg  Status:  Discontinued        400 mg 200 mL/hr over 60 Minutes Intravenous Every 12 hours 05/01/23 1059 05/01/23 1622   05/01/23 2000  ciprofloxacin (CIPRO) tablet 750 mg  Status:  Discontinued        750 mg Oral 2 times daily 05/01/23 1622 05/05/23 0918   04/29/23 2200  ceFAZolin (ANCEF) IVPB 2g/100 mL premix  Status:  Discontinued        2 g 200 mL/hr over 30 Minutes Intravenous Every 8 hours 04/29/23 1404 05/05/23 0918   04/29/23 2115  ciprofloxacin (CIPRO) IVPB  400 mg        400 mg 200 mL/hr over 60 Minutes Intravenous  Once 04/29/23 2017 04/30/23 0130   04/29/23 2000  ciprofloxacin (CIPRO) tablet 750 mg  Status:  Discontinued        750 mg Oral 2 times daily 04/29/23 1404 05/01/23 1059   04/26/23 2200  cefTRIAXone (ROCEPHIN) 1 g in  sodium chloride 0.9 % 100 mL IVPB  Status:  Discontinued        1 g 200 mL/hr over 30 Minutes Intravenous Every 24 hours 04/26/23 0348 04/26/23 1107   04/26/23 1215  ceFEPIme (MAXIPIME) 2 g in sodium chloride 0.9 % 100 mL IVPB  Status:  Discontinued        2 g 200 mL/hr over 30 Minutes Intravenous Every 12 hours 04/26/23 1118 04/29/23 1404   04/26/23 0500  azithromycin (ZITHROMAX) 500 mg in sodium chloride 0.9 % 250 mL IVPB  Status:  Discontinued        500 mg 250 mL/hr over 60 Minutes Intravenous Daily 04/26/23 0348 04/26/23 1107   04/26/23 0130  cefTRIAXone (ROCEPHIN) 2 g in sodium chloride 0.9 % 100 mL IVPB        2 g 200 mL/hr over 30 Minutes Intravenous  Once 04/26/23 0118 04/26/23 0215       Subjective:  Patient seen and examined at bedside today.  She was on high flow oxygen at 15 L/min.  Extensive goals of care discussed with patient and husband at bedside.  She states decision has been made.  She did not want to eat anything but just water.  She states she wants to die  Objective: Vitals:   05/05/23 0344 05/05/23 0722 05/05/23 0725 05/05/23 0727  BP: 125/78     Pulse: 88 85  74  Resp: 20 19  (!) 21  Temp: 97.7 F (36.5 C)     TempSrc: Axillary     SpO2: 97% 99% 98% 98%  Weight:        Intake/Output Summary (Last 24 hours) at 05/05/2023 0927 Last data filed at 05/04/2023 1701 Gross per 24 hour  Intake 240 ml  Output 850 ml  Net -610 ml   Filed Weights   05/02/23 0500 05/03/23 0500 05/04/23 0500  Weight: 42.9 kg 57.5 kg 57.6 kg    Examination:  General exam: Overall comfortable, not in distress HEENT: PERRL Respiratory system:bilateral crackles  Cardiovascular system: S1 & S2 heard, RRR.  Gastrointestinal system: Abdomen is nondistended, soft and nontender. Central nervous system: Alert and oriented Extremities: No edema, no clubbing ,no cyanosis,deformities of hands Skin: No rashes, no ulcers,no icterus        Data Reviewed: I have personally reviewed  following labs and imaging studies  CBC: Recent Labs  Lab 04/29/23 0224 04/30/23 0117 05/01/23 0133 05/03/23 0756 05/04/23 0108  WBC 18.5* 13.1* 13.1* 8.1 10.4  NEUTROABS  --   --  11.0*  --   --   HGB 9.0* 8.7* 9.2* 7.6* 8.2*  HCT 29.5* 28.3* 30.6* 25.0* 27.0*  MCV 105.7* 105.6* 108.1* 104.6* 105.1*  PLT 61* 77* 107* 99* 124*   Basic Metabolic Panel: Recent Labs  Lab 04/28/23 1000 04/29/23 0224 04/30/23 0117 05/01/23 0133 05/01/23 0134 05/03/23 0756 05/04/23 0108  NA 141   < > 137 137 135 134* 136  K 3.6   < > 3.8 3.8 3.8 3.4* 3.5  CL 105   < > 104 99 99 100 100  CO2 23   < >  22 24 22 23 27   GLUCOSE 129*   < > 141* 110* 109* 137* 129*  BUN 19   < > 22 25* 25* 29* 26*  CREATININE 0.61   < > 0.68 0.82 0.77 0.90 0.95  CALCIUM 8.5*   < > 8.7* 9.0 8.9 7.7* 8.0*  MG 2.0  --  1.9 1.8  --   --   --   PHOS 2.3*  --  2.1*  --  2.6  --   --    < > = values in this interval not displayed.     Recent Results (from the past 240 hour(s))  Culture, blood (Routine X 2) w Reflex to ID Panel     Status: Abnormal   Collection Time: 04/26/23  1:20 AM   Specimen: BLOOD  Result Value Ref Range Status   Specimen Description BLOOD SITE NOT SPECIFIED  Final   Special Requests   Final    BOTTLES DRAWN AEROBIC AND ANAEROBIC Blood Culture adequate volume   Culture  Setup Time   Final    GRAM POSITIVE COCCI GRAM NEGATIVE RODS IN BOTH AEROBIC AND ANAEROBIC BOTTLES CRITICAL RESULT CALLED TO, READ BACK BY AND VERIFIED WITH: PHARMD CAREN AMEND ON 04/26/23 @ 1737 BY DRT Performed at [facility], [address], [place], [place] [address]    Culture PSEUDOMONAS AERUGINOSA STAPHYLOCOCCUS AUREUS  (A)  Final   Report Status 04/29/2023 FINAL  Final   Organism ID, Bacteria PSEUDOMONAS AERUGINOSA  Final   Organism ID, Bacteria STAPHYLOCOCCUS AUREUS  Final      Susceptibility   Pseudomonas aeruginosa - MIC*    CEFTAZIDIME 4 SENSITIVE Sensitive     CIPROFLOXACIN <=0.25 SENSITIVE Sensitive      GENTAMICIN <=1 SENSITIVE Sensitive     IMIPENEM 1 SENSITIVE Sensitive     PIP/TAZO 8 SENSITIVE Sensitive     CEFEPIME 2 SENSITIVE Sensitive     * PSEUDOMONAS AERUGINOSA   Staphylococcus aureus - MIC*    CIPROFLOXACIN <=0.5 SENSITIVE Sensitive     ERYTHROMYCIN <=0.25 SENSITIVE Sensitive     GENTAMICIN <=0.5 SENSITIVE Sensitive     OXACILLIN 0.5 SENSITIVE Sensitive     TETRACYCLINE <=1 SENSITIVE Sensitive     VANCOMYCIN 1 SENSITIVE Sensitive     TRIMETH/SULFA <=10 SENSITIVE Sensitive     CLINDAMYCIN <=0.25 SENSITIVE Sensitive     RIFAMPIN <=0.5 SENSITIVE Sensitive     Inducible Clindamycin NEGATIVE Sensitive     LINEZOLID 2 SENSITIVE Sensitive     * STAPHYLOCOCCUS AUREUS  Blood Culture ID Panel (Reflexed)     Status: Abnormal   Collection Time: 04/26/23  1:20 AM  Result Value Ref Range Status   Enterococcus faecalis NOT DETECTED NOT DETECTED Final   Enterococcus Faecium NOT DETECTED NOT DETECTED Final   Listeria monocytogenes NOT DETECTED NOT DETECTED Final   Staphylococcus species DETECTED (A) NOT DETECTED Final    Comment: CRITICAL RESULT CALLED TO, READ BACK BY AND VERIFIED WITH: PHARMD CAREN AMEND ON 04/26/23 @ 1737 BY DRT    Staphylococcus aureus (BCID) DETECTED (A) NOT DETECTED Final    Comment: CRITICAL RESULT CALLED TO, READ BACK BY AND VERIFIED WITH: PHARMD CAREN AMEND ON 04/26/23 @ 1737 BY DRT    Staphylococcus epidermidis NOT DETECTED NOT DETECTED Final   Staphylococcus lugdunensis NOT DETECTED NOT DETECTED Final   Streptococcus species NOT DETECTED NOT DETECTED Final   Streptococcus agalactiae NOT DETECTED NOT DETECTED Final   Streptococcus pneumoniae NOT DETECTED NOT DETECTED Final  Streptococcus pyogenes NOT DETECTED NOT DETECTED Final   A.calcoaceticus-baumannii NOT DETECTED NOT DETECTED Final   Bacteroides fragilis NOT DETECTED NOT DETECTED Final   Enterobacterales NOT DETECTED NOT DETECTED Final   Enterobacter cloacae complex NOT DETECTED NOT DETECTED Final    Escherichia coli NOT DETECTED NOT DETECTED Final   Klebsiella aerogenes NOT DETECTED NOT DETECTED Final   Klebsiella oxytoca NOT DETECTED NOT DETECTED Final   Klebsiella pneumoniae NOT DETECTED NOT DETECTED Final   Proteus species NOT DETECTED NOT DETECTED Final   Salmonella species NOT DETECTED NOT DETECTED Final   Serratia marcescens NOT DETECTED NOT DETECTED Final   Haemophilus influenzae NOT DETECTED NOT DETECTED Final   Neisseria meningitidis NOT DETECTED NOT DETECTED Final   Pseudomonas aeruginosa DETECTED (A) NOT DETECTED Final    Comment: CRITICAL RESULT CALLED TO, READ BACK BY AND VERIFIED WITH: PHARMD CAREN AMEND ON 04/26/23 @ 1737 BY DRT    Stenotrophomonas maltophilia NOT DETECTED NOT DETECTED Final   Candida albicans NOT DETECTED NOT DETECTED Final   Candida auris NOT DETECTED NOT DETECTED Final   Candida glabrata NOT DETECTED NOT DETECTED Final   Candida krusei NOT DETECTED NOT DETECTED Final   Candida parapsilosis NOT DETECTED NOT DETECTED Final   Candida tropicalis NOT DETECTED NOT DETECTED Final   Cryptococcus neoformans/gattii NOT DETECTED NOT DETECTED Final   CTX-M ESBL NOT DETECTED NOT DETECTED Final   Carbapenem resistance IMP NOT DETECTED NOT DETECTED Final   Carbapenem resistance KPC NOT DETECTED NOT DETECTED Final   Meth resistant mecA/C and MREJ NOT DETECTED NOT DETECTED Final   Carbapenem resistance NDM NOT DETECTED NOT DETECTED Final   Carbapenem resistance VIM NOT DETECTED NOT DETECTED Final    Comment: Performed at [facility], [address] [place], [place] [address]  Culture, blood (Routine X 2) w Reflex to ID Panel     Status: Abnormal   Collection Time: 04/26/23  1:50 AM   Specimen: BLOOD LEFT ARM  Result Value Ref Range Status   Specimen Description BLOOD LEFT ARM  Final   Special Requests   Final    BOTTLES DRAWN AEROBIC AND ANAEROBIC Blood Culture adequate volume   Culture  Setup Time   Final    GRAM POSITIVE COCCI GRAM NEGATIVE  RODS CRITICAL RESULT CALLED TO, READ BACK BY AND VERIFIED WITH: PHARMD CAREN AMEND ON 04/26/23 @ 1737 BY DRT    Culture (A)  Final    PSEUDOMONAS AERUGINOSA STAPHYLOCOCCUS AUREUS SUSCEPTIBILITIES PERFORMED ON PREVIOUS CULTURE WITHIN THE LAST 5 DAYS. Performed at [facility], [address] [place], [place] [address]    Report Status 04/29/2023 FINAL  Final  Respiratory (~20 pathogens) panel by PCR     Status: None   Collection Time: 04/26/23  4:11 AM   Specimen: Anterior Nasal Swab; Respiratory  Result Value Ref Range Status   Adenovirus NOT DETECTED NOT DETECTED Final   Coronavirus 229E NOT DETECTED NOT DETECTED Final    Comment: (NOTE) The Coronavirus on the Respiratory Panel, DOES NOT test for the novel  Coronavirus (2019 nCoV)    Coronavirus HKU1 NOT DETECTED NOT DETECTED Final   Coronavirus NL63 NOT DETECTED NOT DETECTED Final   Coronavirus OC43 NOT DETECTED NOT DETECTED Final   Metapneumovirus NOT DETECTED NOT DETECTED Final   Rhinovirus / Enterovirus NOT DETECTED NOT DETECTED Final   Influenza A NOT DETECTED NOT DETECTED Final   Influenza B NOT DETECTED NOT DETECTED Final   Parainfluenza Virus 1 NOT DETECTED NOT  DETECTED Final   Parainfluenza Virus 2 NOT DETECTED NOT DETECTED Final   Parainfluenza Virus 3 NOT DETECTED NOT DETECTED Final   Parainfluenza Virus 4 NOT DETECTED NOT DETECTED Final   Respiratory Syncytial Virus NOT DETECTED NOT DETECTED Final   Bordetella pertussis NOT DETECTED NOT DETECTED Final   Bordetella Parapertussis NOT DETECTED NOT DETECTED Final   Chlamydophila pneumoniae NOT DETECTED NOT DETECTED Final   Mycoplasma pneumoniae NOT DETECTED NOT DETECTED Final    Comment: Performed at [facility], [address], [place], [place] [address]  SARS Coronavirus 2 by RT PCR (hospital order, performed in [facility] hospital lab) *cepheid single result test* Anterior Nasal Swab     Status: Abnormal   Collection Time: 04/26/23  4:11 AM   Specimen:  Anterior Nasal Swab  Result Value Ref Range Status   SARS Coronavirus 2 by RT PCR POSITIVE (A) NEGATIVE Final    Comment: Performed at [facility], [address], [place], [place] [address]  MRSA Next Gen by PCR, Nasal     Status: None   Collection Time: 04/26/23  5:08 AM   Specimen: Nasal Mucosa; Nasal Swab  Result Value Ref Range Status   MRSA by PCR Next Gen NOT DETECTED NOT DETECTED Final    Comment: (NOTE) The GeneXpert MRSA Assay (FDA approved for NASAL specimens only), is one component of a comprehensive MRSA colonization surveillance program. It is not intended to diagnose MRSA infection nor to guide or monitor treatment for MRSA infections. Test performance is not FDA approved in patients less than 43 years old. Performed at [facility], [address], [place], [place] [address]   Culture, blood (Routine X 2) w Reflex to ID Panel     Status: None   Collection Time: 04/28/23  9:43 AM   Specimen: BLOOD LEFT HAND  Result Value Ref Range Status   Specimen Description BLOOD LEFT HAND  Final   Special Requests   Final    BOTTLES DRAWN AEROBIC ONLY Blood Culture adequate volume   Culture   Final    NO GROWTH 5 DAYS Performed at [facility], [address], [place], [place] [address]    Report Status 05/03/2023 FINAL  Final  Culture, blood (Routine X 2) w Reflex to ID Panel     Status: None   Collection Time: 04/28/23  9:57 AM   Specimen: BLOOD LEFT HAND  Result Value Ref Range Status   Specimen Description BLOOD LEFT HAND  Final   Special Requests   Final    BOTTLES DRAWN AEROBIC ONLY Blood Culture adequate volume   Culture   Final    NO GROWTH 5 DAYS Performed at [name], [address] [place], [place] [address]    Report Status 05/03/2023 FINAL  Final     Radiology Studies: No results found.  Scheduled Meds:  arformoterol  15 mcg Nebulization BID   budesonide (PULMICORT) nebulizer solution  0.25 mg Nebulization BID   diclofenac  Sodium  4 g Topical QID   haloperidol lactate  1 mg Intravenous Once   mirtazapine  15 mg Oral QHS   ondansetron (ZOFRAN) IV  4 mg Intravenous Once   potassium chloride  40 mEq Oral Once   revefenacin  175 mcg Nebulization Daily   Continuous Infusions:  HYDROmorphone       LOS: 9 days   Burnadette Pop, MD [facility] P7/14/2024, 9:27 AM

## 2023-05-05 NOTE — TOC Progression Note (Signed)
Transition of Care [facility]) - Progression Note    Patient Details  Name: Madison Holden MRN: [id number] Date of Birth: 1945-11-12  Transition of Care [facility] CM/SW Contact  Dannielle Baskins A [place], [place] Phone Number: 05/05/2023, 10:16 AM  Clinical Narrative:     CSW contacted pt via phone. She said she had people in and out all morning and that "the goal was clear."  She said that she does not need rehab.   CSW then contacted pt's husband and he informed me that pt was an anticipated hospital death and that comfort care measures were in place.   CSW relayed condolences and understanding.     CSW will sign off for now as social work intervention is no longer needed. Please consult [place] again if new needs arise.   Expected Discharge Plan: Home w Home Health Services Barriers to Discharge: Continued Medical Work up  Expected Discharge Plan and Services   Discharge Planning Services: CM Consult   Living arrangements for the past 2 months: Single Family Home                                       Social Determinants of Health (SDOH) Interventions SDOH Screenings   Food Insecurity: No Food Insecurity (04/26/2023)  Housing: Low Risk  (04/26/2023)  Transportation Needs: No Transportation Needs (04/26/2023)  Utilities: Not At Risk (04/26/2023)  Depression (PHQ2-9): Low Risk  (06/23/2019)  Social Connections: Unknown (03/03/2022)   Received from [facility]  Tobacco Use: Low Risk  (04/26/2023)    Readmission Risk Interventions     No data to display

## 2023-05-06 DIAGNOSIS — A499 Bacterial infection, unspecified: Secondary | ICD-10-CM | POA: Diagnosis not present

## 2023-05-06 DIAGNOSIS — Z515 Encounter for palliative care: Secondary | ICD-10-CM | POA: Diagnosis not present

## 2023-05-06 DIAGNOSIS — Z7189 Other specified counseling: Secondary | ICD-10-CM | POA: Diagnosis not present

## 2023-05-21 ENCOUNTER — Ambulatory Visit: Payer: Medicare Other | Admitting: Internal Medicine

## 2023-05-23 NOTE — Progress Notes (Signed)
PROGRESS NOTE  Madison Holden  YNW:[id number] DOB: 1946/06/17 DOA: 05/20/2023 PCP: Henrine Screws, MD   Brief Narrative: Patient is a 51 female with history of ILD/pulmonary fibrosis, rheumatoid arthritis on Enbrel and methotrexate, pulmonary hypertension, heart failure who presented with worsening shortness of breath.  COVID, to be positive.  She was hypoxic to 76% on home nasal cannula.  On presentation,she  required BiPAP, chest x-ray showed chronic ILD with a scattered airspace opacities.  Lab work showed elevated lactic acid of 4.1, BNP of 839, WBC of 21.5.  She was hypotensive, required low-dose Levophed.  Admitted under ICU service.  Hospital course remarkable for persistent requirement of high flow oxygen.  Finding of polymicrobial pneumonia with MSSA, Pseudomonas.  Blood cultures showed Staphylococcus, Pseudomonas.  Patient transferred to [facility] service on 7/9.  PCCM/ID  were following.  After extensive discussion of goals of care, her care has been transitioned to comfort.  Currently on full comfort care  Assessment & Plan:  Principal Problem:   Polymicrobial bacterial infection Active Problems:   Respiratory failure (HCC)   COVID-19 virus infection   PAH (pulmonary artery hypertension) (HCC)   Septic shock (HCC)   Neck pain   Protein-calorie malnutrition, severe  Severe sepsis with septic shock secondary to pneumonia/MSSA/Pseudomonas bacteremia: Hypotensive, hypoxic on presentation with elevated lactate.  Was on Cipro and cefazolin, currently on comfort care.  Acute on chronic hypoxic respiratory failure/ILD/pulmonary fibrosis: On 3 L of oxygen at home.  Had to be put on BiPAP on presentation.  Was on high flow oxygen.  Currently on comfort care.    Neck pain: This is most from her rheumatic arthritis. Continue comfort meds  Chronic diastolic CHF, pulmonary hypertension: On comfort care  Diabetes type 2: On Farxiga at home.    History of rheumatoid arthritis: On Enbrel,  methotrexate at home.Has deformities on hands  Anemia/thrombocytopenia: Likely in the setting of critical illness.    COVID infection: on comfort care  Goals of care: Severe respiratory failure from pneumonia/COVID.  Palliative care consulted. Now on comfort care   Debility/deconditioning: PT/OT consulted.  Recommended skilled nursing facility on discharge.Om comfort care,anticipated hospital death     Nutrition Problem: Severe Malnutrition Etiology: acute illness Pressure Injury 05/17/2023 Sacrum Left (Active)  04/25/2023 0500  Location: Sacrum  Location Orientation: Left  Staging:   Wound Description (Comments):   Present on Admission:   Dressing Type Foam - Lift dressing to assess site every shift 05/05/23 2000    DVT prophylaxis:     Code Status: DNR  Family Communication: Discussed with husband at bedside on 7/15  Patient status:Inpatient  Patient is from :home  Anticipated discharge to: likely  hospital death    Consultants: PCCM,ID,palliative care  Procedures:None  Antimicrobials:  Anti-infectives (From admission, onward)    Start     Dose/Rate Route Frequency Ordered Stop   05/01/23 2000  ciprofloxacin (CIPRO) IVPB 400 mg  Status:  Discontinued        400 mg 200 mL/hr over 60 Minutes Intravenous Every 12 hours 05/01/23 1059 05/01/23 1622   05/01/23 2000  ciprofloxacin (CIPRO) tablet 750 mg  Status:  Discontinued        750 mg Oral 2 times daily 05/01/23 1622 05/05/23 0918   04/29/23 2200  ceFAZolin (ANCEF) IVPB 2g/100 mL premix  Status:  Discontinued        2 g 200 mL/hr over 30 Minutes Intravenous Every 8 hours 04/29/23 1404 05/05/23 0918   04/29/23 2115  ciprofloxacin (CIPRO)  IVPB 400 mg        400 mg 200 mL/hr over 60 Minutes Intravenous  Once 04/29/23 2017 04/30/23 0130   04/29/23 2000  ciprofloxacin (CIPRO) tablet 750 mg  Status:  Discontinued        750 mg Oral 2 times daily 04/29/23 1404 05/01/23 1059   05/01/2023 2200  cefTRIAXone (ROCEPHIN) 1 g  in sodium chloride 0.9 % 100 mL IVPB  Status:  Discontinued        1 g 200 mL/hr over 30 Minutes Intravenous Every 24 hours 05/12/2023 0348 04/22/2023 1107   05/22/2023 1215  ceFEPIme (MAXIPIME) 2 g in sodium chloride 0.9 % 100 mL IVPB  Status:  Discontinued        2 g 200 mL/hr over 30 Minutes Intravenous Every 12 hours 05/20/2023 1118 04/29/23 1404   05/16/2023 0500  azithromycin (ZITHROMAX) 500 mg in sodium chloride 0.9 % 250 mL IVPB  Status:  Discontinued        500 mg 250 mL/hr over 60 Minutes Intravenous Daily 05/18/2023 0348 05/07/2023 1107   04/28/2023 0130  cefTRIAXone (ROCEPHIN) 2 g in sodium chloride 0.9 % 100 mL IVPB        2 g 200 mL/hr over 30 Minutes Intravenous  Once 05/09/2023 0118 05/02/2023 0215       Subjective:  Patient seen and examined at the bedside today.  She is on full comfort care.  Unresponsive. Cheyne Stokes breathing observed.  Likely near to the end of life  Objective: Vitals:   05/05/23 0727 05/05/23 1201 05/05/23 1205 05/09/2023 0914  BP:  114/63  (!) 61/39  Pulse: 74 85 81 (!) 53  Resp: (!) 21 20 (!) 22 (!) 8  Temp:  97.7 F (36.5 C)    TempSrc:  Axillary    SpO2: 98% 97% 100% 93%  Weight:        Intake/Output Summary (Last 24 hours) at 05/14/2023 1101 Last data filed at 04/27/2023 0600 Gross per 24 hour  Intake 13.24 ml  Output --  Net 13.24 ml   Filed Weights   05/02/23 0500 05/03/23 0500 05/04/23 0500  Weight: 42.9 kg 57.5 kg 57.6 kg    Examination:  General exam: On full comfort care, unresponsive, Cheyne Stokes breathing  Data Reviewed: I have personally reviewed following labs and imaging studies  CBC: Recent Labs  Lab 04/30/23 0117 05/01/23 0133 05/03/23 0756 05/04/23 0108  WBC 13.1* 13.1* 8.1 10.4  NEUTROABS  --  11.0*  --   --   HGB 8.7* 9.2* 7.6* 8.2*  HCT 28.3* 30.6* 25.0* 27.0*  MCV 105.6* 108.1* 104.6* 105.1*  PLT 77* 107* 99* 124*   Basic Metabolic Panel: Recent Labs  Lab 04/30/23 0117 05/01/23 0133 05/01/23 0134  05/03/23 0756 05/04/23 0108  NA 137 137 135 134* 136  K 3.8 3.8 3.8 3.4* 3.5  CL 104 99 99 100 100  CO2 22 24 22 23 27   GLUCOSE 141* 110* 109* 137* 129*  BUN 22 25* 25* 29* 26*  CREATININE 0.68 0.82 0.77 0.90 0.95  CALCIUM 8.7* 9.0 8.9 7.7* 8.0*  MG 1.9 1.8  --   --   --   PHOS 2.1*  --  2.6  --   --      Recent Results (from the past 240 hour(s))  Culture, blood (Routine X 2) w Reflex to ID Panel     Status: None   Collection Time: 04/28/23  9:43 AM   Specimen: BLOOD LEFT HAND  Result Value Ref Range Status   Specimen Description BLOOD LEFT HAND  Final   Special Requests   Final    BOTTLES DRAWN AEROBIC ONLY Blood Culture adequate volume   Culture   Final    NO GROWTH 5 DAYS Performed at [facility], [address], [place], [place] [address]    Report Status 05/03/2023 FINAL  Final  Culture, blood (Routine X 2) w Reflex to ID Panel     Status: None   Collection Time: 04/28/23  9:57 AM   Specimen: BLOOD LEFT HAND  Result Value Ref Range Status   Specimen Description BLOOD LEFT HAND  Final   Special Requests   Final    BOTTLES DRAWN AEROBIC ONLY Blood Culture adequate volume   Culture   Final    NO GROWTH 5 DAYS Performed at [facility], [address], [place], [place] [address]    Report Status 05/03/2023 FINAL  Final     Radiology Studies: No results found.  Scheduled Meds:  arformoterol  15 mcg Nebulization BID   budesonide (PULMICORT) nebulizer solution  0.25 mg Nebulization BID   diclofenac Sodium  4 g Topical QID   haloperidol lactate  1 mg Intravenous Once   mirtazapine  15 mg Oral QHS   ondansetron (ZOFRAN) IV  4 mg Intravenous Once   potassium chloride  40 mEq Oral Once   revefenacin  175 mcg Nebulization Daily   Continuous Infusions:  HYDROmorphone 1.5 mg/hr (05/05/23 1724)     LOS: 10 days   Burnadette Pop, MD [facility] P7/15/2024, 11:01 AM

## 2023-05-23 NOTE — Progress Notes (Cosign Needed Addendum)
The dilaudid was not entered properly to document wastage. We wasted a 50 mg of dilaudid in a 50 mL bag of normal saline (1 mg/mL).  Wasted in the main pharmacy stericycle on 05/05/2023 at 8:50pm.   Stephenie Acres, PharmD PGY1 Pharmacy Resident 05/15/2023 8:49 PM   Algie Coffer, RPh

## 2023-05-23 NOTE — Progress Notes (Signed)
   Palliative Medicine Inpatient Follow Up Note HPI: 77 y.o. female  with past medical history of ILD, pulmonary fibrosis, rheumatoid arthritis on Enbrel and methotrexate, pulmonary HTN, R heart failure,  admitted on 04/30/2023 with septic shock related to COVID and multi species bacteremia. Initially required levophed but off now. Currently on HFNC 40 L/min 70% FiO2. Palliative medicine consulted for symptom management.   Today's Discussion 04/23/2023  *Please note that this is a verbal dictation therefore any spelling or grammatical errors are due to the "[employer]" system interpretation.  Chart reviewed inclusive of vital signs, progress notes, laboratory results, and diagnostic images.   I spoke with patients RN who believes patient is not long for this world.   I met at bedside this morning with patients spouse, [name]. He shares that Adaira has been sleeping since about 9PM last night. He feels that she has looked peaceful. [name] also shares that Dr. [name] had rounded and vocalized that Aryah will likely pass today.  Upon assessment Eraina had cheyne stokes respirations. She is cool to the touch. No notable secretion burden.  I spoke to Gerlene Burdock about decreasing her supplemental O2 requirements.  We lamented on [name] and Felicha life together. We discussed that he realized how deeply he loves her while going through trial-some times with her health. We discussed her strong and "hard headed" personality. I was able to provide emotional support through therapeutic listening.  At this time plan for continued measures to provide comfort.   Questions and concerns addressed/Palliative Support Provided.   Objective Assessment: Vital Signs Vitals:   05/05/23 1205 05/16/2023 0914  BP:  (!) 61/39  Pulse: 81 (!) 53  Resp: (!) 22 (!) 8  Temp:    SpO2: 100% 93%    Intake/Output Summary (Last 24 hours) at 05/05/2023 1046 Last data filed at 05/14/2023 0600 Gross per 24 hour   Intake 13.24 ml  Output --  Net 13.24 ml   Last Weight  Most recent update: 05/04/2023  6:59 AM    Weight  57.6 kg (126 lb 15.8 oz)            Gen:  Elderly caucasian F in NAD HEENT: moist mucous membranes CV: Regular rate and rhythm  PULM:  On 4LPM [place], cheyne stoke repistation ABD: soft/nontender/nondistended  EXT: No edema  Neuro: Somnolent  SUMMARY OF RECOMMENDATIONS   DNAR/DNI  Comfort Care  Continue low dose dilaudid gtt in the setting of respiratory symptoms and chronic pain from RA  Slow wean of O2  Ongoing PMT support  Anticipate in hospital death  Billing based on MDM: High ______________________________________________________________________________________ Lamarr Lulas [place] Palliative Medicine Team Team Cell Phone: [phone] Please utilize secure chat with additional questions, if there is no response within 30 minutes please call the above phone number  Palliative Medicine Team providers are available by phone from 7am to 7pm daily and can be reached through the team cell phone.  Should this patient require assistance outside of these hours, please call the patient's attending physician.

## 2023-05-23 NOTE — Discharge Summary (Signed)
Death Summary  Madison Holden UJW:[id number] DOB: 1946-06-26 DOA: 04/28/2023  PCP: Henrine Screws, MD  Admit date: 05/12/2023 Date of Death: 2023-05-17 Time of Death: 1901    History of present illness:   Patient is a 77 female with history of ILD/pulmonary fibrosis, rheumatoid arthritis on Enbrel and methotrexate, pulmonary hypertension, heart failure who presented with worsening shortness of breath. COVID, to be positive. She was hypoxic to 76% on home nasal cannula. On presentation,she required BiPAP, chest x-ray showed chronic ILD with a scattered airspace opacities. Lab work showed elevated lactic acid of 4.1, BNP of 839, WBC of 21.5. She was hypotensive, required low-dose Levophed. Admitted under ICU service. Hospital course remarkable for persistent requirement of high flow oxygen. Finding of polymicrobial pneumonia with MSSA, Pseudomonas. Blood cultures showed Staphylococcus, Pseudomonas. Patient transferred to [facility] service on 7/9. PCCM/ID were following.  Hospital course was remarkable for persistent requirement of high flow oxygen, poor oral intake.  Patient continued to remain weak, intermittently confused, malnourished.  After extensive discussion about goals of care, patient wanted to convert her care to comfort.  She was started on Dilaudid drip.  She  expired peacefully on 75/15     Final Diagnoses:  1.   Multifocal pneumonia   The results of significant diagnostics from this hospitalization (including imaging, microbiology, ancillary and laboratory) are listed below for reference.    Significant Diagnostic Studies: NM Pulmonary Perfusion  Result Date: 04/28/2023 CLINICAL DATA:  Shortness of breath and chest pain. Chronic respiratory failure with hypoxia. Evaluate for pulmonary embolism. EXAM: NUCLEAR MEDICINE PERFUSION LUNG SCAN TECHNIQUE: Perfusion images were obtained in multiple projections after intravenous injection of radiopharmaceutical. Ventilation scans intentionally  deferred if perfusion scan and chest x-ray adequate for interpretation during COVID 19 epidemic. RADIOPHARMACEUTICALS:  4.1 mCi Tc-52m MAA IV COMPARISON:  Chest radiograph-11/02/2022; 04/23/2023; May 06, 2023 Chest CTA-May 06, 2023 FINDINGS: Review of chest radiograph performed 04/23/2023 demonstrates enlarged cardiac silhouette and mediastinal contours with atherosclerotic plaque within the thoracic aorta. There are extensive bilateral coarsened interstitial opacities, most conspicuous about the bilateral pulmonary hila, right-greater-than-left. No pleural effusion or pneumothorax. Perfusion imaging demonstrates mottled perfusion of the bilateral pulmonary parenchyma without discrete segmental or subsegmental perfusion defect to suggest pulmonary embolism. IMPRESSION: Pulmonary embolism absent (very low probability of pulmonary embolism) (Note, this was confirmed on subsequent CTA of the chest, abdomen and pelvis performed 3 days later on May 06, 2023) Electronically Signed   By: Simonne Come M.D.   On: 04/28/2023 15:38   DG Chest 2 View  Result Date: 04/28/2023 CLINICAL DATA:  [id number] Pulmonary HTN (HCC) [id number] EXAM: CHEST - 2 VIEW COMPARISON:  11/10/2022 FINDINGS: Cardiac silhouette is prominent. There is pulmonary interstitial prominence with vascular congestion. No focal consolidation. No pneumothorax or pleural effusion identified. IMPRESSION: Findings suggest CHF. Electronically Signed   By: Layla Maw M.D.   On: 04/28/2023 14:49   ECHOCARDIOGRAM COMPLETE  Result Date: 04/27/2023    ECHOCARDIOGRAM REPORT   Patient Name:   Madison Holden Date of Exam: 04/27/2023 Medical Rec #:  [id number]       Height:       58.0 in Accession #:    [id number]      Weight:       126.1 lb Date of Birth:  [birth year]      BSA:          1.497 m Patient Age:    77 years        BP:  140/76 mmHg Patient Gender: F               HR:           75 bpm. Exam Location:  Inpatient Procedure: 2D Echo, Cardiac Doppler and Color  Doppler Indications:    Shock R57.9  History:        Patient has prior history of Echocardiogram examinations, most                 recent 01/02/2023. Pulmonary HTN, Signs/Symptoms:Chest Pain; Risk                 Factors:Hypertension, Diabetes and Dyslipidemia.  Sonographer:    Lucendia Herrlich Referring Phys: [id number] LAURA R GLEASON IMPRESSIONS  1. Left ventricular ejection fraction, by estimation, is 60 to 65%. The left ventricle has normal function. The left ventricle has no regional wall motion abnormalities. There is mild left ventricular hypertrophy. Left ventricular diastolic parameters are consistent with Grade I diastolic dysfunction (impaired relaxation). Elevated left ventricular end-diastolic pressure.  2. Right ventricular systolic function is severely reduced. The right ventricular size is severely enlarged.  3. Left atrial size was moderately dilated.  4. Right atrial size was mildly dilated.  5. The mitral valve is abnormal. Trivial mitral valve regurgitation. No evidence of mitral stenosis. Moderate mitral annular calcification.  6. Tricuspid valve regurgitation is moderate.  7. The aortic valve is tricuspid. There is mild calcification of the aortic valve. There is mild thickening of the aortic valve. Aortic valve regurgitation is mild. Aortic valve sclerosis is present, with no evidence of aortic valve stenosis.  8. The inferior vena cava is normal in size with greater than 50% respiratory variability, suggesting right atrial pressure of 3 mmHg. FINDINGS  Left Ventricle: Left ventricular ejection fraction, by estimation, is 60 to 65%. The left ventricle has normal function. The left ventricle has no regional wall motion abnormalities. The left ventricular internal cavity size was normal in size. There is  mild left ventricular hypertrophy. Left ventricular diastolic parameters are consistent with Grade I diastolic dysfunction (impaired relaxation). Elevated left ventricular end-diastolic pressure.  Right Ventricle: The right ventricular size is severely enlarged. No increase in right ventricular wall thickness. Right ventricular systolic function is severely reduced. Left Atrium: Left atrial size was moderately dilated. Right Atrium: Right atrial size was mildly dilated. Pericardium: Trivial pericardial effusion is present. The pericardial effusion is posterior to the left ventricle. Mitral Valve: The mitral valve is abnormal. There is mild thickening of the mitral valve leaflet(s). Moderate mitral annular calcification. Trivial mitral valve regurgitation. No evidence of mitral valve stenosis. MV peak gradient, 9.5 mmHg. The mean mitral valve gradient is 3.0 mmHg. Tricuspid Valve: The tricuspid valve is normal in structure. Tricuspid valve regurgitation is moderate . No evidence of tricuspid stenosis. Aortic Valve: The aortic valve is tricuspid. There is mild calcification of the aortic valve. There is mild thickening of the aortic valve. Aortic valve regurgitation is mild. Aortic regurgitation PHT measures 571 msec. Aortic valve sclerosis is present,  with no evidence of aortic valve stenosis. Aortic valve mean gradient measures 5.0 mmHg. Aortic valve peak gradient measures 9.8 mmHg. Aortic valve area, by VTI measures 2.00 cm. Pulmonic Valve: The pulmonic valve was normal in structure. Pulmonic valve regurgitation is mild. No evidence of pulmonic stenosis. Aorta: The aortic root is normal in size and structure. Venous: The inferior vena cava is normal in size with greater than 50% respiratory variability, suggesting right atrial pressure of 3 mmHg.  IAS/Shunts: The interatrial septum was not well visualized.  LEFT VENTRICLE PLAX 2D LVIDd:         3.90 cm   Diastology LVIDs:         2.30 cm   LV e' medial:    6.92 cm/s LV PW:         1.10 cm   LV E/e' medial:  14.1 LV IVS:        1.30 cm   LV e' lateral:   5.52 cm/s LVOT diam:     2.00 cm   LV E/e' lateral: 17.6 LV SV:         65 LV SV Index:   44 LVOT Area:      3.14 cm  RIGHT VENTRICLE RV S prime:     8.73 cm/s TAPSE (M-mode): 1.1 cm LEFT ATRIUM             Index        RIGHT ATRIUM           Index LA diam:        4.30 cm 2.87 cm/m   RA Area:     15.30 cm LA Vol (A2C):   72.6 ml 48.53 ml/m  RA Volume:   36.25 ml  24.22 ml/m LA Vol (A4C):   83.1 ml 55.51 ml/m LA Biplane Vol: 80.3 ml 53.64 ml/m  AORTIC VALVE                     PULMONIC VALVE AV Area (Vmax):    1.89 cm      PR End Diast Vel: 15.37 msec AV Area (Vmean):   1.94 cm AV Area (VTI):     2.00 cm AV Vmax:           156.50 cm/s AV Vmean:          101.000 cm/s AV VTI:            0.327 m AV Peak Grad:      9.8 mmHg AV Mean Grad:      5.0 mmHg LVOT Vmax:         94.30 cm/s LVOT Vmean:        62.400 cm/s LVOT VTI:          0.208 m LVOT/AV VTI ratio: 0.64 AI PHT:            571 msec  AORTA Ao Root diam: 3.20 cm Ao Asc diam:  3.50 cm MITRAL VALVE                TRICUSPID VALVE MV Area (PHT): 2.86 cm     TR Peak grad:   107.7 mmHg MV Area VTI:   1.57 cm     TR Vmax:        519.00 cm/s MV Peak grad:  9.5 mmHg MV Mean grad:  3.0 mmHg     SHUNTS MV Vmax:       1.54 m/s     Systemic VTI:  0.21 m MV Vmean:      82.0 cm/s    Systemic Diam: 2.00 cm MV Decel Time: 266 msec MV E velocity: 97.30 cm/s MV A velocity: 133.00 cm/s MV E/A ratio:  0.73 Charlton Haws MD Electronically signed by Charlton Haws MD Signature Date/Time: 04/27/2023/11:31:05 AM    Final    CT Angio Chest Pulmonary Embolism (PE) W or WO Contrast  Result Date: 05/05/2023 CLINICAL DATA:  Respiratory distress, hypoxia, hypotension. EXAM: CT ANGIOGRAPHY CHEST  CT ABDOMEN AND PELVIS WITH CONTRAST TECHNIQUE: Multidetector CT imaging of the chest was performed using the standard protocol during bolus administration of intravenous contrast. Multiplanar CT image reconstructions and MIPs were obtained to evaluate the vascular anatomy. Multidetector CT imaging of the abdomen and pelvis was performed using the standard protocol during bolus administration of  intravenous contrast. RADIATION DOSE REDUCTION: This exam was performed according to the departmental dose-optimization program which includes automated exposure control, adjustment of the mA and/or kV according to patient size and/or use of iterative reconstruction technique. CONTRAST:  60mL OMNIPAQUE IOHEXOL 350 MG/ML SOLN COMPARISON:  CT chest dated 01/31/2023. CT abdomen/pelvis dated 07/03/2018. FINDINGS: CTA CHEST FINDINGS Cardiovascular: Satisfactory opacification the bilateral pulmonary arteries to the lobar level. Bilateral lower lobe evaluation is constrained by respiratory motion and right lower lobe opacity (described below. Within that constraint, there is no evidence of pulmonary embolism. Enlargement the main pulmonary artery, suggesting pulmonary arterial hypertension. Although not tailored for evaluation of the thoracic aorta, there is no evidence of thoracic aortic aneurysm or dissection. Atherosclerotic calcifications of the aortic arch. Heart is top-normal in size.  No pericardial effusion. Moderate coronary atherosclerosis of the LAD. Mediastinum/Nodes: Small mediastinal lymph nodes, including a dominant 13 mm short axis right paratracheal node (series 5/image 48), favored to be reactive. Visualized thyroid is unremarkable. Lungs/Pleura: Patchy right lower lobe opacity, suspicious for pneumonia. Subpleural reticulation/fibrosis in the lungs bilaterally, right upper lobe predominant, compatible with the patient's known chronic interstitial lung disease. No interval progression. Trace right pleural effusion.  No pneumothorax. Musculoskeletal: Degenerative changes of the thoracic spine. Review of the MIP images confirms the above findings. CT ABDOMEN and PELVIS FINDINGS Motion degraded images. Hepatobiliary: Liver is within normal limits. Gallbladder is unremarkable. No intrahepatic or extrahepatic ductal dilatation. Pancreas: Within normal limits. Spleen: Within normal limits. Adrenals/Urinary  Tract: Adrenal glands are within normal limits. Kidneys are within normal limits.  No hydronephrosis. Bladder is within normal limits. Stomach/Bowel: Stomach is notable for a moderate hiatal hernia. No evidence of bowel obstruction. Appendix is not discretely visualized. Cecum/ileocecal valve are just to the right of midline in the lower pelvis, reflecting a mobile cecum. No colonic wall thickening or inflammatory changes. Vascular/Lymphatic: No evidence of abdominal aortic aneurysm. Atherosclerotic calcifications of the abdominal aorta and branch vessels. No suspicious abdominopelvic lymphadenopathy. Reproductive: Status post hysterectomy. No adnexal masses. Other: No abdominopelvic ascites. Musculoskeletal: Degenerative changes of the lumbar spine. Review of the MIP images confirms the above findings. IMPRESSION: No evidence of pulmonary embolism. Right lower lobe pneumonia.  Trace right pleural effusion. Chronic interstitial lung disease, as above. Moderate hiatal hernia.  Otherwise negative CT abdomen/pelvis. Electronically Signed   By: Charline Bills M.D.   On: 05/17/2023 03:58   CT ABDOMEN PELVIS W CONTRAST  Result Date: 05/16/2023 CLINICAL DATA:  Respiratory distress, hypoxia, hypotension. EXAM: CT ANGIOGRAPHY CHEST CT ABDOMEN AND PELVIS WITH CONTRAST TECHNIQUE: Multidetector CT imaging of the chest was performed using the standard protocol during bolus administration of intravenous contrast. Multiplanar CT image reconstructions and MIPs were obtained to evaluate the vascular anatomy. Multidetector CT imaging of the abdomen and pelvis was performed using the standard protocol during bolus administration of intravenous contrast. RADIATION DOSE REDUCTION: This exam was performed according to the departmental dose-optimization program which includes automated exposure control, adjustment of the mA and/or kV according to patient size and/or use of iterative reconstruction technique. CONTRAST:  60mL  OMNIPAQUE IOHEXOL 350 MG/ML SOLN COMPARISON:  CT chest dated 01/31/2023. CT abdomen/pelvis dated 07/03/2018.  FINDINGS: CTA CHEST FINDINGS Cardiovascular: Satisfactory opacification the bilateral pulmonary arteries to the lobar level. Bilateral lower lobe evaluation is constrained by respiratory motion and right lower lobe opacity (described below. Within that constraint, there is no evidence of pulmonary embolism. Enlargement the main pulmonary artery, suggesting pulmonary arterial hypertension. Although not tailored for evaluation of the thoracic aorta, there is no evidence of thoracic aortic aneurysm or dissection. Atherosclerotic calcifications of the aortic arch. Heart is top-normal in size.  No pericardial effusion. Moderate coronary atherosclerosis of the LAD. Mediastinum/Nodes: Small mediastinal lymph nodes, including a dominant 13 mm short axis right paratracheal node (series 5/image 48), favored to be reactive. Visualized thyroid is unremarkable. Lungs/Pleura: Patchy right lower lobe opacity, suspicious for pneumonia. Subpleural reticulation/fibrosis in the lungs bilaterally, right upper lobe predominant, compatible with the patient's known chronic interstitial lung disease. No interval progression. Trace right pleural effusion.  No pneumothorax. Musculoskeletal: Degenerative changes of the thoracic spine. Review of the MIP images confirms the above findings. CT ABDOMEN and PELVIS FINDINGS Motion degraded images. Hepatobiliary: Liver is within normal limits. Gallbladder is unremarkable. No intrahepatic or extrahepatic ductal dilatation. Pancreas: Within normal limits. Spleen: Within normal limits. Adrenals/Urinary Tract: Adrenal glands are within normal limits. Kidneys are within normal limits.  No hydronephrosis. Bladder is within normal limits. Stomach/Bowel: Stomach is notable for a moderate hiatal hernia. No evidence of bowel obstruction. Appendix is not discretely visualized. Cecum/ileocecal valve are  just to the right of midline in the lower pelvis, reflecting a mobile cecum. No colonic wall thickening or inflammatory changes. Vascular/Lymphatic: No evidence of abdominal aortic aneurysm. Atherosclerotic calcifications of the abdominal aorta and branch vessels. No suspicious abdominopelvic lymphadenopathy. Reproductive: Status post hysterectomy. No adnexal masses. Other: No abdominopelvic ascites. Musculoskeletal: Degenerative changes of the lumbar spine. Review of the MIP images confirms the above findings. IMPRESSION: No evidence of pulmonary embolism. Right lower lobe pneumonia.  Trace right pleural effusion. Chronic interstitial lung disease, as above. Moderate hiatal hernia.  Otherwise negative CT abdomen/pelvis. Electronically Signed   By: Charline Bills M.D.   On: 05/22/2023 03:58   DG Chest Port 1 View  Result Date: 04/24/2023 CLINICAL DATA:  Shortness of breath. EXAM: PORTABLE CHEST 1 VIEW COMPARISON:  04/23/2023, 01/31/2023. FINDINGS: The heart is enlarged and the mediastinal contour is within normal limits. The pulmonary vasculature is distended. Interstitial prominence is present bilaterally with scattered airspace opacities bilaterally. No definite effusion or pneumothorax. No acute osseous abnormality. IMPRESSION: 1. Cardiomegaly with mildly distended pulmonary vasculature. 2. Interstitial prominence bilaterally scattered airspace opacities, not significantly changed from prior exam and may be associated with patient's known interstitial lung disease. Correlate clinically to exclude superimposed edema or pneumonia. Electronically Signed   By: Thornell Sartorius M.D.   On: 04/27/2023 01:18    Microbiology: Recent Results (from the past 240 hour(s))  Culture, blood (Routine X 2) w Reflex to ID Panel     Status: None   Collection Time: 04/28/23  9:43 AM   Specimen: BLOOD LEFT HAND  Result Value Ref Range Status   Specimen Description BLOOD LEFT HAND  Final   Special Requests   Final     BOTTLES DRAWN AEROBIC ONLY Blood Culture adequate volume   Culture   Final    NO GROWTH 5 DAYS Performed at [facility], [address], [place], [place] [address]    Report Status 05/03/2023 FINAL  Final  Culture, blood (Routine X 2) w Reflex to ID Panel     Status: None  Collection Time: 04/28/23  9:57 AM   Specimen: BLOOD LEFT HAND  Result Value Ref Range Status   Specimen Description BLOOD LEFT HAND  Final   Special Requests   Final    BOTTLES DRAWN AEROBIC ONLY Blood Culture adequate volume   Culture   Final    NO GROWTH 5 DAYS Performed at [facility], [address] [place], [place] [address]    Report Status 05/03/2023 FINAL  Final     Labs: Basic Metabolic Panel: Recent Labs  Lab 05/01/23 0133 05/01/23 0134 05/03/23 0756 05/04/23 0108  NA 137 135 134* 136  K 3.8 3.8 3.4* 3.5  CL 99 99 100 100  CO2 24 22 23 27   GLUCOSE 110* 109* 137* 129*  BUN 25* 25* 29* 26*  CREATININE 0.82 0.77 0.90 0.95  CALCIUM 9.0 8.9 7.7* 8.0*  MG 1.8  --   --   --   PHOS  --  2.6  --   --    Liver Function Tests: Recent Labs  Lab 05/01/23 0134  ALBUMIN 2.7*   No results for input(s): "LIPASE", "AMYLASE" in the last 168 hours. No results for input(s): "AMMONIA" in the last 168 hours. CBC: Recent Labs  Lab 05/01/23 0133 05/03/23 0756 05/04/23 0108  WBC 13.1* 8.1 10.4  NEUTROABS 11.0*  --   --   HGB 9.2* 7.6* 8.2*  HCT 30.6* 25.0* 27.0*  MCV 108.1* 104.6* 105.1*  PLT 107* 99* 124*   Cardiac Enzymes: No results for input(s): "CKTOTAL", "CKMB", "CKMBINDEX", "TROPONINI" in the last 168 hours. D-Dimer No results for input(s): "DDIMER" in the last 72 hours. BNP: Invalid input(s): "POCBNP" CBG: Recent Labs  Lab 05/04/23 0613 05/04/23 1146 05/04/23 1635 05/04/23 2042 05/05/23 0627  GLUCAP 105* 95 123* 92 78   Anemia work up No results for input(s): "VITAMINB12", "FOLATE", "FERRITIN", "TIBC", "IRON", "RETICCTPCT" in the last 72 hours. Urinalysis     Component Value Date/Time   COLORURINE AMBER (A) 05/22/2023 1530   APPEARANCEUR HAZY (A) 04/24/2023 1530   LABSPEC 1.042 (H) 05/08/2023 1530   PHURINE 5.0 05/19/2023 1530   GLUCOSEU >=500 (A) 04/29/2023 1530   HGBUR NEGATIVE 04/23/2023 1530   BILIRUBINUR NEGATIVE 05/12/2023 1530   KETONESUR NEGATIVE 05/10/2023 1530   PROTEINUR 30 (A) 05/12/2023 1530   NITRITE NEGATIVE 05/04/2023 1530   LEUKOCYTESUR NEGATIVE 04/28/2023 1530   Sepsis Labs Recent Labs  Lab 05/01/23 0133 05/03/23 0756 05/04/23 0108  WBC 13.1* 8.1 10.4       SIGNED:  Burnadette Pop, MD  [facility] 05/07/2023, 1:54 PM Pager [phone]  If 7PM-7AM, please contact night-coverage [email or web address] PassworPassword [email or web address]

## 2023-05-23 NOTE — Progress Notes (Signed)
Nutrition Brief Note  Chart reviewed. Pt has transitioned to comfort care.  No further nutrition interventions planned at this time.  Please re-consult as needed.    Kate Holmes, MS, RD, LDN Inpatient Clinical Dietitian Please see AMiON for contact information.  

## 2023-05-23 DEATH — deceased

## 2023-05-24 ENCOUNTER — Telehealth: Payer: Self-pay | Admitting: Internal Medicine

## 2023-05-24 DIAGNOSIS — M359 Systemic involvement of connective tissue, unspecified: Secondary | ICD-10-CM

## 2023-05-24 DIAGNOSIS — J8489 Other specified interstitial pulmonary diseases: Secondary | ICD-10-CM

## 2023-05-24 NOTE — Telephone Encounter (Signed)
PT husband calling again. States he took care of the [employer] isdsue below. He just needs the O2 RX cancelled so they will come and get the equip. Thanks.,

## 2023-05-28 NOTE — Telephone Encounter (Signed)
Order placed for [employer] to cancel supplies and equipment.

## 2023-06-05 ENCOUNTER — Encounter (HOSPITAL_COMMUNITY): Payer: Medicare Other | Admitting: Cardiology

## 2023-12-01 IMAGING — MG DIGITAL DIAGNOSTIC BILAT W/ TOMO W/ CAD
6 of 10 series · 6 of 30 positions shown · non-contrast
Comparison: Previous exam(s).

CLINICAL DATA: 75-year-old female recalled from screening mammogram
dated 11/29/2021 for a possible right breast asymmetry and possible
left breast mass.

EXAM:
DIGITAL DIAGNOSTIC BILATERAL MAMMOGRAM WITH TOMOSYNTHESIS AND CAD
TECHNIQUE: Bilateral digital diagnostic mammography and breast tomosynthesis
was performed. The images were evaluated with computer-aided
detection.

[R MLO synth-2D (1 of 2)]
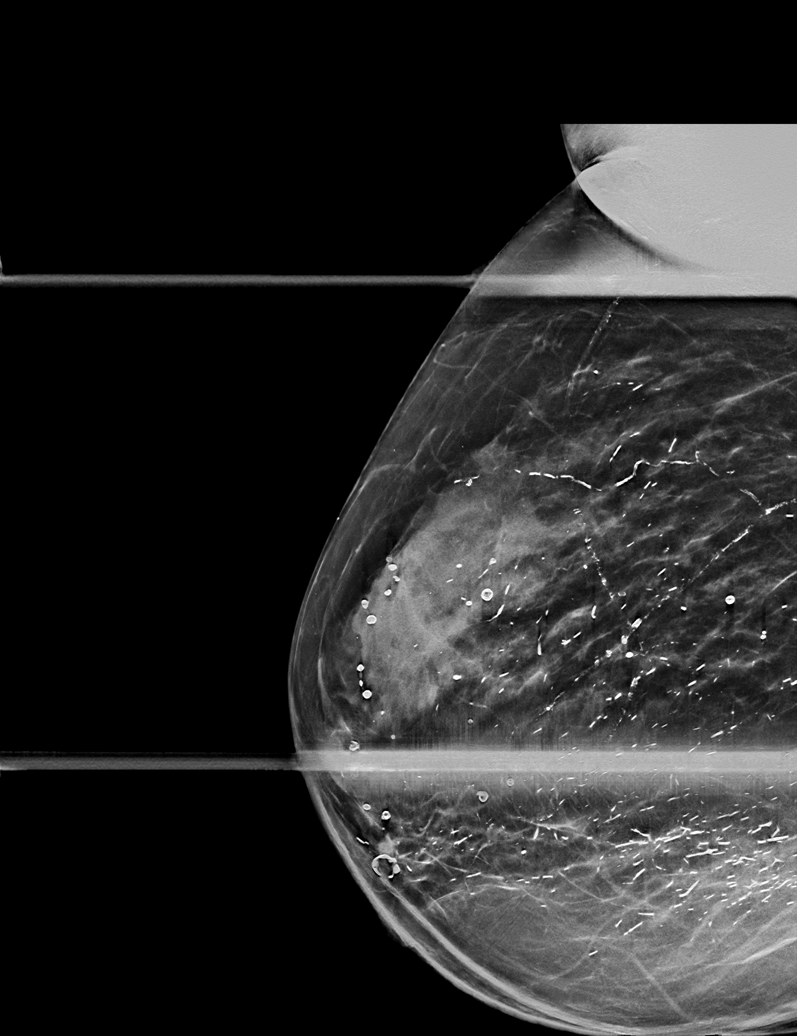

[R MLO synth-2D (2 of 2)]
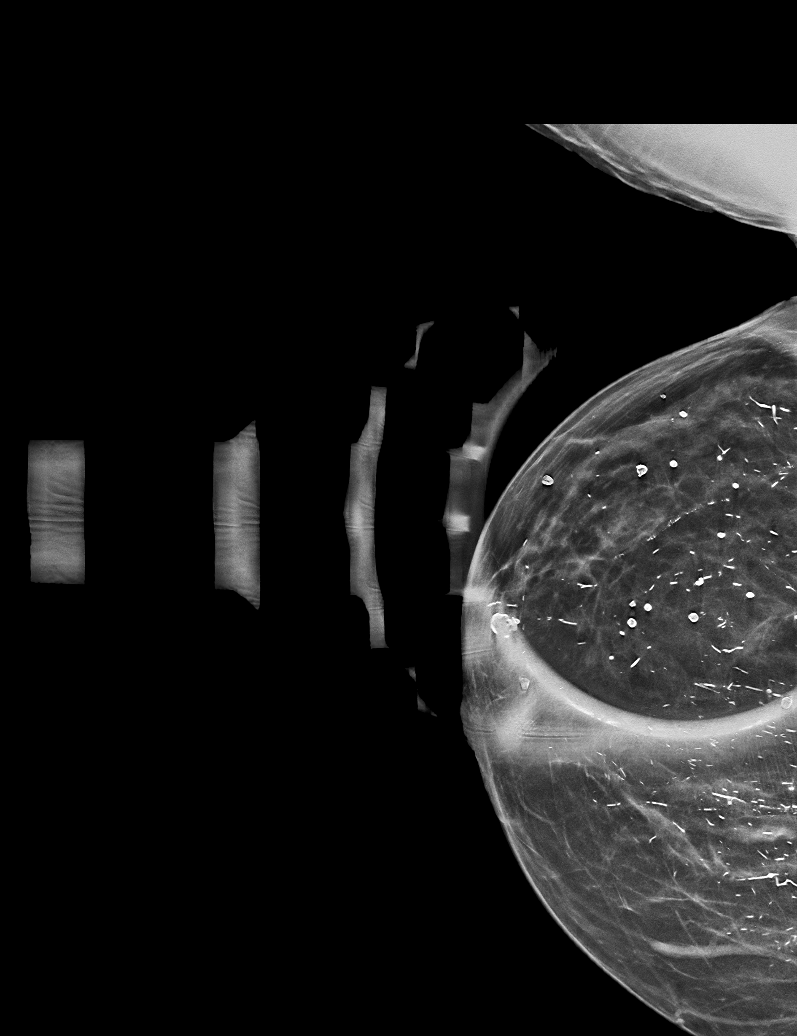

[R LM synth-2D]
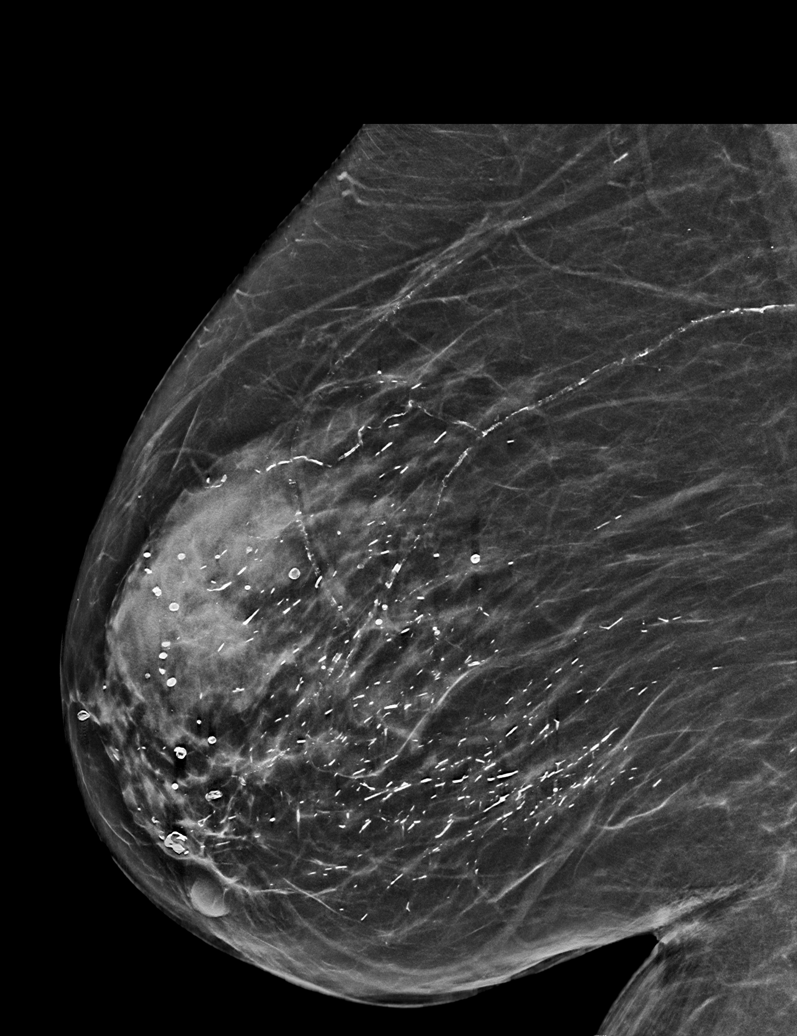

[L MLO synth-2D]
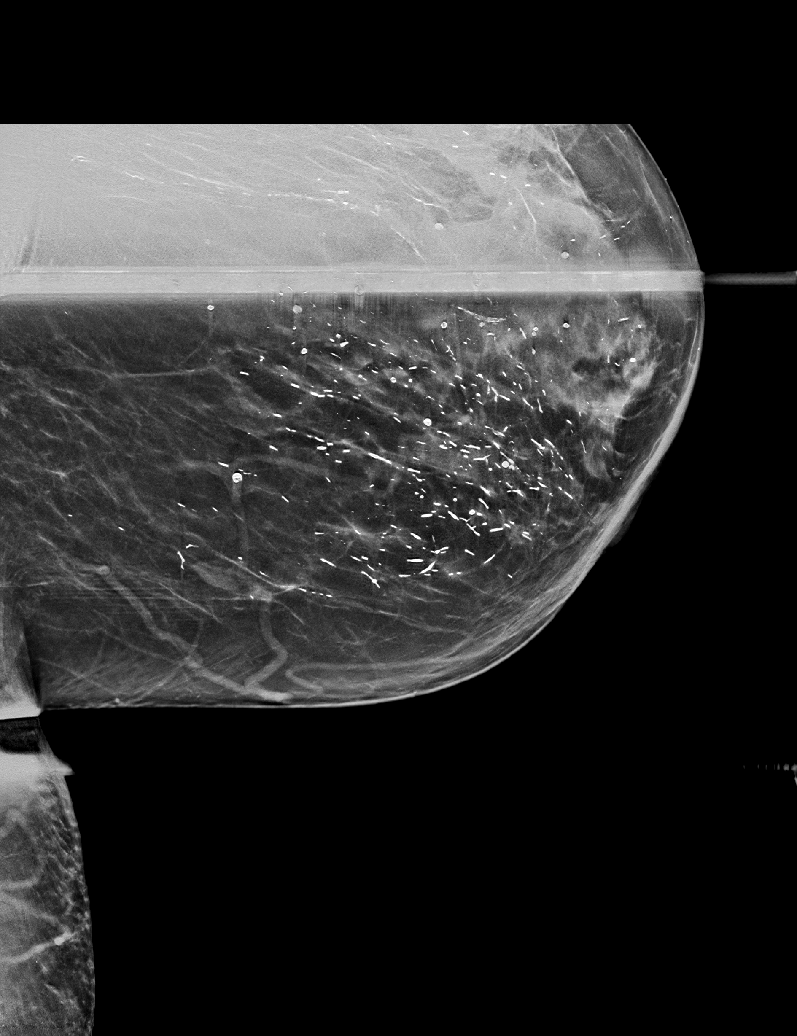

[L CC synth-2D]
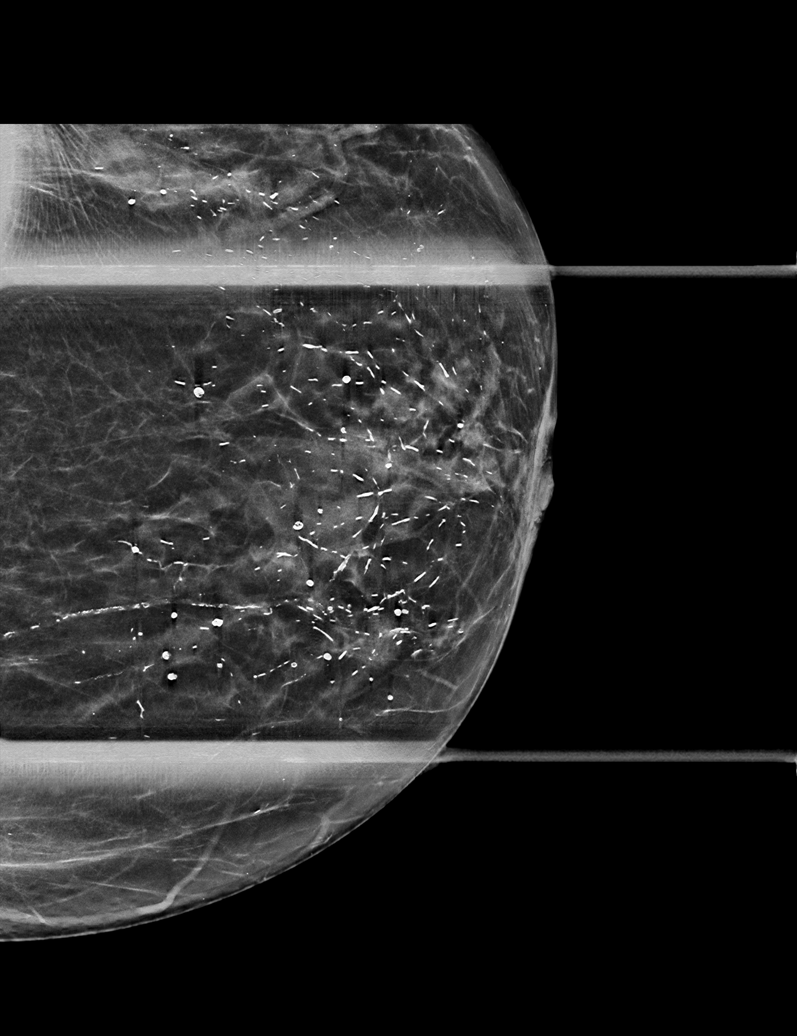

[L MLO tomo · tomo slice 28/55.0]
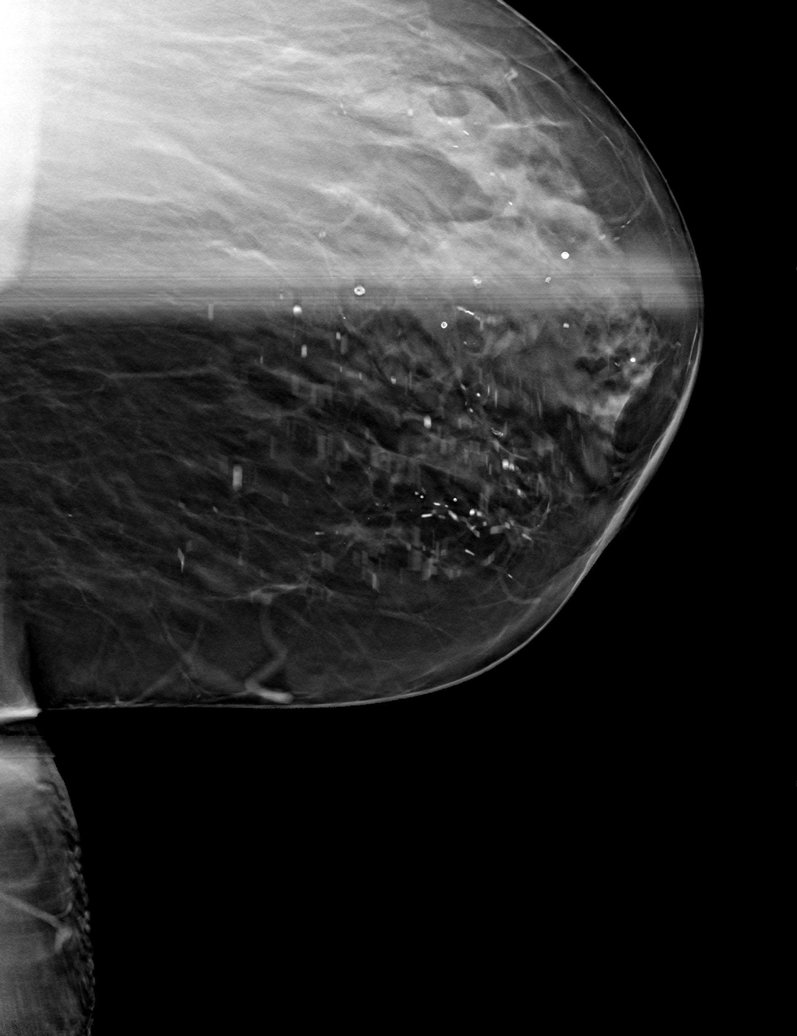

[6 of 30 positions shown; findings below may reference images not displayed]

ACR Breast Density Category c: The breast tissue is heterogeneously
dense, which may obscure small masses.
FINDINGS: Previously described, possible asymmetry in the upper right breast
at mid depth is seen on the MLO projection resolves into well
dispersed fibroglandular tissue. Possible mass in the lower inner
left breast at posterior depth also resolves into well dispersed
fibroglandular tissue. No suspicious findings identified.
IMPRESSION: No mammographic evidence of malignancy in either breast.

RECOMMENDATION:
Screening mammogram in one year.(Code:K1-X-GRM)

I have discussed the findings and recommendations with the patient.
If applicable, a reminder letter will be sent to the patient
regarding the next appointment.

BI-RADS CATEGORY  1: Negative.

## 2024-04-03 ENCOUNTER — Encounter (INDEPENDENT_AMBULATORY_CARE_PROVIDER_SITE_OTHER): Payer: Medicare Other | Admitting: Ophthalmology
